# Patient Record
Sex: Male | Born: 1960 | Race: White | Hispanic: No | Marital: Single | Smoking: Former smoker
Health system: Southern US, Community
[De-identification: ages and names within clinical notes are randomized; demographics above are authoritative.]

## PROBLEM LIST (undated history)

## (undated) DIAGNOSIS — K219 Gastro-esophageal reflux disease without esophagitis: Secondary | ICD-10-CM

## (undated) DIAGNOSIS — Z8669 Personal history of other diseases of the nervous system and sense organs: Secondary | ICD-10-CM

## (undated) DIAGNOSIS — J449 Chronic obstructive pulmonary disease, unspecified: Secondary | ICD-10-CM

## (undated) DIAGNOSIS — I495 Sick sinus syndrome: Secondary | ICD-10-CM

## (undated) DIAGNOSIS — E785 Hyperlipidemia, unspecified: Secondary | ICD-10-CM

## (undated) DIAGNOSIS — Z950 Presence of cardiac pacemaker: Secondary | ICD-10-CM

## (undated) DIAGNOSIS — R413 Other amnesia: Secondary | ICD-10-CM

## (undated) DIAGNOSIS — G4733 Obstructive sleep apnea (adult) (pediatric): Secondary | ICD-10-CM

## (undated) DIAGNOSIS — E7800 Pure hypercholesterolemia, unspecified: Secondary | ICD-10-CM

## (undated) DIAGNOSIS — F4310 Post-traumatic stress disorder, unspecified: Secondary | ICD-10-CM

## (undated) DIAGNOSIS — J45909 Unspecified asthma, uncomplicated: Secondary | ICD-10-CM

## (undated) DIAGNOSIS — I10 Essential (primary) hypertension: Secondary | ICD-10-CM

## (undated) DIAGNOSIS — C801 Malignant (primary) neoplasm, unspecified: Secondary | ICD-10-CM

## (undated) HISTORY — PX: CARDIAC SURGERY: SHX584

## (undated) HISTORY — PX: PACEMAKER INSERTION: SHX728

---

## 2017-03-13 ENCOUNTER — Encounter (HOSPITAL_COMMUNITY): Payer: Self-pay | Admitting: Emergency Medicine

## 2017-03-13 ENCOUNTER — Emergency Department (HOSPITAL_COMMUNITY)
Admission: EM | Admit: 2017-03-13 | Discharge: 2017-03-13 | Disposition: A | Discharge status: Home / Self Care | Payer: Medicaid Other | Attending: Emergency Medicine | Admitting: Emergency Medicine

## 2017-03-13 ENCOUNTER — Other Ambulatory Visit: Payer: Self-pay

## 2017-03-13 DIAGNOSIS — I10 Essential (primary) hypertension: Secondary | ICD-10-CM | POA: Diagnosis not present

## 2017-03-13 DIAGNOSIS — F17290 Nicotine dependence, other tobacco product, uncomplicated: Secondary | ICD-10-CM | POA: Insufficient documentation

## 2017-03-13 DIAGNOSIS — R002 Palpitations: Secondary | ICD-10-CM | POA: Diagnosis not present

## 2017-03-13 DIAGNOSIS — Z950 Presence of cardiac pacemaker: Secondary | ICD-10-CM | POA: Diagnosis not present

## 2017-03-13 DIAGNOSIS — R55 Syncope and collapse: Secondary | ICD-10-CM | POA: Diagnosis present

## 2017-03-13 HISTORY — DX: Pure hypercholesterolemia, unspecified: E78.00

## 2017-03-13 HISTORY — DX: Essential (primary) hypertension: I10

## 2017-03-13 HISTORY — DX: Presence of cardiac pacemaker: Z95.0

## 2017-03-13 HISTORY — DX: Other amnesia: R41.3

## 2017-03-13 LAB — CBG MONITORING, ED: GLUCOSE-CAPILLARY: 116 mg/dL — AB (ref 65–99)

## 2017-03-13 LAB — BASIC METABOLIC PANEL
Anion gap: 11 (ref 5–15)
BUN: 14 mg/dL (ref 6–20)
CALCIUM: 9.6 mg/dL (ref 8.9–10.3)
CO2: 26 mmol/L (ref 22–32)
CREATININE: 1.24 mg/dL (ref 0.61–1.24)
Chloride: 100 mmol/L — ABNORMAL LOW (ref 101–111)
GFR calc Af Amer: >60 mL/min (ref >60)
GFR calc non Af Amer: >60 mL/min (ref >60)
GLUCOSE: 115 mg/dL — AB (ref 65–99)
Potassium: 3.2 mmol/L — ABNORMAL LOW (ref 3.5–5.1)
Sodium: 137 mmol/L (ref 135–145)

## 2017-03-13 LAB — CBC
HCT: 43.8 % (ref 39.0–52.0)
Hemoglobin: 15.1 g/dL (ref 13.0–17.0)
MCH: 29 pg (ref 26.0–34.0)
MCHC: 34.5 g/dL (ref 30.0–36.0)
MCV: 84.1 fL (ref 78.0–100.0)
PLATELETS: 165 10*3/uL (ref 150–400)
RBC: 5.21 MIL/uL (ref 4.22–5.81)
RDW: 13.5 % (ref 11.5–15.5)
WBC: 12.5 10*3/uL — ABNORMAL HIGH (ref 4.0–10.5)

## 2017-03-13 MED ORDER — POTASSIUM CHLORIDE CRYS ER 20 MEQ PO TBCR
40.0000 meq | EXTENDED_RELEASE_TABLET | Freq: Once | ORAL | Status: AC
  Administered 2017-03-13: 40 meq via ORAL
  Filled 2017-03-13: qty 2

## 2017-03-13 NOTE — ED Notes (Signed)
[employer] pacemaker interrogated at triage.

## 2017-03-13 NOTE — ED Notes (Signed)
Checked CBG 116, RN Karen informed

## 2017-03-13 NOTE — ED Provider Notes (Signed)
[facility] EMERGENCY DEPARTMENT Provider Note   CSN: [id number] Arrival date & time: 03/13/17  0305     History   Chief Complaint Chief Complaint  Patient presents with  . Near Syncope    HPI Daniel Bryant is a 56 y.o. male.  Patient presents to the ED with a chief complaint of palpitations.  He states that he felt 2 pauses in his heart tonight.  He thinks that this is associated with stress, and attributes this to being in an argument about his [employer] benefits yesterday.  He has a pacemaker, which was interrogated in triage.  He denies any chest pain, difficulty breathing.  States that he feels improved now.   The history is provided by the patient. No language interpreter was used.    Past Medical History:  Diagnosis Date  . High cholesterol   . Hypertension   . Memory impairment   . Pacemaker     There are no active problems to display for this patient.   Past Surgical History:  Procedure Laterality Date  . PACEMAKER INSERTION         Home Medications    Prior to Admission medications   Not on File    Family History No family history on file.  Social History Social History   Tobacco Use  . Smoking status: Current Every Day Smoker    Types: E-cigarettes  . Smokeless tobacco: Never Used  Substance Use Topics  . Alcohol use: No    Frequency: Never  . Drug use: No     Allergies   Patient has no allergy information on record.   Review of Systems Review of Systems  All other systems reviewed and are negative.    Physical Exam Updated Vital Signs BP (!) 145/101 (BP Location: Right Arm)   Pulse 95   Temp 98.1 F (36.7 C) (Oral)   Resp 18   SpO2 95%   Physical Exam  Constitutional: He is oriented to person, place, and time. He appears well-developed and well-nourished.  HENT:  Head: Normocephalic and atraumatic.  Eyes: Conjunctivae and EOM are normal. Pupils are equal, round, and reactive to light. Right eye  exhibits no discharge. Left eye exhibits no discharge. No scleral icterus.  Neck: Normal range of motion. Neck supple. No JVD present.  Cardiovascular: Normal rate, regular rhythm and normal heart sounds. Exam reveals no gallop and no friction rub.  No murmur heard. Pulmonary/Chest: Effort normal and breath sounds normal. No respiratory distress. He has no wheezes. He has no rales. He exhibits no tenderness.  Abdominal: Soft. He exhibits no distension and no mass. There is no tenderness. There is no rebound and no guarding.  Musculoskeletal: Normal range of motion. He exhibits no edema or tenderness.  Neurological: He is alert and oriented to person, place, and time.  Skin: Skin is warm and dry.  Psychiatric: He has a normal mood and affect. His behavior is normal. Judgment and thought content normal.  Nursing note and vitals reviewed.    ED Treatments / Results  Labs (all labs ordered are listed, but only abnormal results are displayed) Labs Reviewed  CBG MONITORING, ED - Abnormal; Notable for the following components:      Result Value   Glucose-Capillary 116 (*)    All other components within normal limits  BASIC METABOLIC PANEL  CBC  URINALYSIS, ROUTINE W REFLEX MICROSCOPIC    EKG  EKG Interpretation  Date/Time:  Monday March 13 2017 03:18:37 EST  Ventricular Rate:  99 PR Interval:  138 QRS Duration: 92 QT Interval:  354 QTC Calculation: 454 R Axis:   49 Text Interpretation:  Normal sinus rhythm T wave abnormality, consider inferior ischemia Abnormal ECG No old tracing to compare Confirmed by Jola Schmidt [phone]) on 03/13/2017 3:54:44 AM       Radiology No results found.  Procedures Procedures (including critical care time)  Medications Ordered in ED Medications - No data to display   Initial Impression / Assessment and Plan / ED Course  I have reviewed the triage vital signs and the nursing notes.  Pertinent labs & imaging results that were available  during my care of the patient were reviewed by me and considered in my medical decision making (see chart for details).     Patient with palpitations tonight.  Pacemaker showed one 6 second run for rapid ventricular rate.  This was at 3:27 after the patient had already arrived in the ED.    He is stable.  His labs are reassuring, but mildly hypokalemic.  Will supplement K.    Discussed with Dr. Venora Maples, who agrees with PCP follow-up.    Likely stress induced.  Pacer interrogation is otherwise reassuring.    VSS.   Final Clinical Impressions(s) / ED Diagnoses   Final diagnoses:  Palpitations    ED Discharge Orders    None       Montine Circle, PA-C 03/13/17 9518    Jola Schmidt, MD 03/13/17 [phone]

## 2017-03-13 NOTE — ED Triage Notes (Signed)
Pt to ED via [facility]> reports pacemaker paused tonight before he was going to bed and caused near syncope.  Reports sob.  Denies pain.

## 2018-04-05 ENCOUNTER — Emergency Department (HOSPITAL_COMMUNITY): Payer: No Typology Code available for payment source

## 2018-04-05 ENCOUNTER — Inpatient Hospital Stay (HOSPITAL_COMMUNITY)
Admission: EM | Admit: 2018-04-05 | Discharge: 2018-04-09 | DRG: 186 | Disposition: A | Discharge status: Home / Self Care | Payer: No Typology Code available for payment source | Attending: Internal Medicine | Admitting: Internal Medicine

## 2018-04-05 ENCOUNTER — Observation Stay (HOSPITAL_COMMUNITY): Payer: No Typology Code available for payment source

## 2018-04-05 ENCOUNTER — Other Ambulatory Visit: Payer: Self-pay

## 2018-04-05 ENCOUNTER — Encounter (HOSPITAL_COMMUNITY): Payer: Self-pay | Admitting: Emergency Medicine

## 2018-04-05 DIAGNOSIS — R0603 Acute respiratory distress: Secondary | ICD-10-CM

## 2018-04-05 DIAGNOSIS — J441 Chronic obstructive pulmonary disease with (acute) exacerbation: Secondary | ICD-10-CM | POA: Diagnosis present

## 2018-04-05 DIAGNOSIS — J189 Pneumonia, unspecified organism: Secondary | ICD-10-CM | POA: Diagnosis present

## 2018-04-05 DIAGNOSIS — J440 Chronic obstructive pulmonary disease with acute lower respiratory infection: Secondary | ICD-10-CM | POA: Diagnosis present

## 2018-04-05 DIAGNOSIS — J9601 Acute respiratory failure with hypoxia: Secondary | ICD-10-CM | POA: Diagnosis present

## 2018-04-05 DIAGNOSIS — R0902 Hypoxemia: Secondary | ICD-10-CM

## 2018-04-05 DIAGNOSIS — D751 Secondary polycythemia: Secondary | ICD-10-CM | POA: Diagnosis present

## 2018-04-05 DIAGNOSIS — Z79899 Other long term (current) drug therapy: Secondary | ICD-10-CM

## 2018-04-05 DIAGNOSIS — K219 Gastro-esophageal reflux disease without esophagitis: Secondary | ICD-10-CM | POA: Diagnosis present

## 2018-04-05 DIAGNOSIS — G4733 Obstructive sleep apnea (adult) (pediatric): Secondary | ICD-10-CM | POA: Diagnosis present

## 2018-04-05 DIAGNOSIS — Z720 Tobacco use: Secondary | ICD-10-CM

## 2018-04-05 DIAGNOSIS — J90 Pleural effusion, not elsewhere classified: Principal | ICD-10-CM | POA: Diagnosis present

## 2018-04-05 DIAGNOSIS — E7800 Pure hypercholesterolemia, unspecified: Secondary | ICD-10-CM | POA: Diagnosis present

## 2018-04-05 DIAGNOSIS — E785 Hyperlipidemia, unspecified: Secondary | ICD-10-CM | POA: Diagnosis present

## 2018-04-05 DIAGNOSIS — I10 Essential (primary) hypertension: Secondary | ICD-10-CM | POA: Diagnosis present

## 2018-04-05 DIAGNOSIS — E876 Hypokalemia: Secondary | ICD-10-CM | POA: Diagnosis present

## 2018-04-05 DIAGNOSIS — J181 Lobar pneumonia, unspecified organism: Secondary | ICD-10-CM

## 2018-04-05 DIAGNOSIS — R413 Other amnesia: Secondary | ICD-10-CM | POA: Diagnosis present

## 2018-04-05 DIAGNOSIS — Z9889 Other specified postprocedural states: Secondary | ICD-10-CM

## 2018-04-05 DIAGNOSIS — G40A09 Absence epileptic syndrome, not intractable, without status epilepticus: Secondary | ICD-10-CM | POA: Diagnosis present

## 2018-04-05 DIAGNOSIS — F4310 Post-traumatic stress disorder, unspecified: Secondary | ICD-10-CM | POA: Diagnosis present

## 2018-04-05 DIAGNOSIS — Z7982 Long term (current) use of aspirin: Secondary | ICD-10-CM

## 2018-04-05 DIAGNOSIS — Z9119 Patient's noncompliance with other medical treatment and regimen: Secondary | ICD-10-CM

## 2018-04-05 HISTORY — DX: Post-traumatic stress disorder, unspecified: F43.10

## 2018-04-05 HISTORY — DX: Unspecified asthma, uncomplicated: J45.909

## 2018-04-05 HISTORY — DX: Obstructive sleep apnea (adult) (pediatric): G47.33

## 2018-04-05 HISTORY — DX: Hyperlipidemia, unspecified: E78.5

## 2018-04-05 HISTORY — DX: Personal history of other diseases of the nervous system and sense organs: Z86.69

## 2018-04-05 HISTORY — DX: Gastro-esophageal reflux disease without esophagitis: K21.9

## 2018-04-05 HISTORY — DX: Chronic obstructive pulmonary disease, unspecified: J44.9

## 2018-04-05 HISTORY — DX: Sick sinus syndrome: I49.5

## 2018-04-05 LAB — CBC WITH DIFFERENTIAL/PLATELET
Abs Immature Granulocytes: 0.02 10*3/uL (ref 0.00–0.07)
Basophils Absolute: 0.1 10*3/uL (ref 0.0–0.1)
Basophils Relative: 1 %
Eosinophils Absolute: 0.5 10*3/uL (ref 0.0–0.5)
Eosinophils Relative: 5 %
HCT: 54.9 % — ABNORMAL HIGH (ref 39.0–52.0)
Hemoglobin: 17.6 g/dL — ABNORMAL HIGH (ref 13.0–17.0)
Immature Granulocytes: 0 %
Lymphocytes Relative: 24 %
Lymphs Abs: 2.3 10*3/uL (ref 0.7–4.0)
MCH: 28.5 pg (ref 26.0–34.0)
MCHC: 32.1 g/dL (ref 30.0–36.0)
MCV: 88.8 fL (ref 80.0–100.0)
Monocytes Absolute: 0.6 10*3/uL (ref 0.1–1.0)
Monocytes Relative: 6 %
Neutro Abs: 6.4 10*3/uL (ref 1.7–7.7)
Neutrophils Relative %: 64 %
Platelets: 188 10*3/uL (ref 150–400)
RBC: 6.18 MIL/uL — ABNORMAL HIGH (ref 4.22–5.81)
RDW: 14.5 % (ref 11.5–15.5)
WBC: 9.8 10*3/uL (ref 4.0–10.5)
nRBC: 0 % (ref 0.0–0.2)

## 2018-04-05 LAB — BASIC METABOLIC PANEL
Anion gap: 11 (ref 5–15)
BUN: 7 mg/dL (ref 6–20)
CO2: 25 mmol/L (ref 22–32)
Calcium: 8.8 mg/dL — ABNORMAL LOW (ref 8.9–10.3)
Chloride: 101 mmol/L (ref 98–111)
Creatinine, Ser: 0.97 mg/dL (ref 0.61–1.24)
GFR calc Af Amer: >60 mL/min (ref >60)
GFR calc non Af Amer: >60 mL/min (ref >60)
Glucose, Bld: 140 mg/dL — ABNORMAL HIGH (ref 70–99)
Potassium: 3 mmol/L — ABNORMAL LOW (ref 3.5–5.1)
Sodium: 137 mmol/L (ref 135–145)

## 2018-04-05 LAB — BODY FLUID CELL COUNT WITH DIFFERENTIAL
Eos, Fluid: 23 %
Lymphs, Fluid: 55 %
Monocyte-Macrophage-Serous Fluid: 15 % — ABNORMAL LOW (ref 50–90)
Neutrophil Count, Fluid: 7 % (ref 0–25)
Total Nucleated Cell Count, Fluid: 848 uL (ref 0–1000)

## 2018-04-05 LAB — LACTATE DEHYDROGENASE, PLEURAL OR PERITONEAL FLUID: LD, Fluid: 167 U/L — ABNORMAL HIGH (ref 3–23)

## 2018-04-05 LAB — PHOSPHORUS: Phosphorus: 2.7 mg/dL (ref 2.5–4.6)

## 2018-04-05 LAB — PROTEIN, PLEURAL OR PERITONEAL FLUID: Total protein, fluid: 4.6 g/dL

## 2018-04-05 LAB — GLUCOSE, PLEURAL OR PERITONEAL FLUID: Glucose, Fluid: 119 mg/dL

## 2018-04-05 LAB — MAGNESIUM: Magnesium: 2 mg/dL (ref 1.7–2.4)

## 2018-04-05 MED ORDER — CEFTRIAXONE SODIUM 1 G IJ SOLR
1.0000 g | Freq: Once | INTRAMUSCULAR | Status: AC
  Administered 2018-04-05: 1 g via INTRAVENOUS
  Filled 2018-04-05: qty 10

## 2018-04-05 MED ORDER — HEPARIN SODIUM (PORCINE) 5000 UNIT/ML IJ SOLN: 5000.0000 [IU] | Freq: Three times a day (TID) | INTRAMUSCULAR | Status: DC

## 2018-04-05 MED ORDER — ALBUTEROL (5 MG/ML) CONTINUOUS INHALATION SOLN
15.0000 mg/h | INHALATION_SOLUTION | RESPIRATORY_TRACT | Status: DC
  Administered 2018-04-05: 15 mg/h via RESPIRATORY_TRACT
  Filled 2018-04-05: qty 20

## 2018-04-05 MED ORDER — IPRATROPIUM-ALBUTEROL 0.5-2.5 (3) MG/3ML IN SOLN
3.0000 mL | Freq: Four times a day (QID) | RESPIRATORY_TRACT | Status: DC
  Administered 2018-04-05 (×2): 3 mL via RESPIRATORY_TRACT
  Filled 2018-04-05 (×2): qty 3

## 2018-04-05 MED ORDER — ALBUTEROL SULFATE (2.5 MG/3ML) 0.083% IN NEBU: 2.5000 mg | INHALATION_SOLUTION | Freq: Four times a day (QID) | RESPIRATORY_TRACT | Status: DC

## 2018-04-05 MED ORDER — SODIUM CHLORIDE 0.9 % IV SOLN
500.0000 mg | INTRAVENOUS | Status: DC
  Filled 2018-04-05: qty 500

## 2018-04-05 MED ORDER — KETOROLAC TROMETHAMINE 30 MG/ML IJ SOLN: 30.0000 mg | Freq: Four times a day (QID) | INTRAMUSCULAR | Status: AC | PRN

## 2018-04-05 MED ORDER — ASPIRIN EC 81 MG PO TBEC
81.0000 mg | DELAYED_RELEASE_TABLET | Freq: Every day | ORAL | Status: DC
  Administered 2018-04-06 – 2018-04-09 (×4): 81 mg via ORAL
  Filled 2018-04-05 (×4): qty 1

## 2018-04-05 MED ORDER — POTASSIUM CHLORIDE CRYS ER 20 MEQ PO TBCR
60.0000 meq | EXTENDED_RELEASE_TABLET | Freq: Once | ORAL | Status: AC
  Administered 2018-04-05: 60 meq via ORAL
  Filled 2018-04-05: qty 3

## 2018-04-05 MED ORDER — HEPARIN SODIUM (PORCINE) 5000 UNIT/ML IJ SOLN
5000.0000 [IU] | Freq: Three times a day (TID) | INTRAMUSCULAR | Status: DC
  Administered 2018-04-05 – 2018-04-09 (×11): 5000 [IU] via SUBCUTANEOUS
  Filled 2018-04-05 (×11): qty 1

## 2018-04-05 MED ORDER — ACETAMINOPHEN 650 MG RE SUPP: 650.0000 mg | Freq: Four times a day (QID) | RECTAL | Status: DC | PRN

## 2018-04-05 MED ORDER — SODIUM CHLORIDE 0.9 % IV SOLN
1.0000 g | INTRAVENOUS | Status: DC
  Administered 2018-04-06 – 2018-04-09 (×4): 1 g via INTRAVENOUS
  Filled 2018-04-05 (×4): qty 1

## 2018-04-05 MED ORDER — METHYLPREDNISOLONE SODIUM SUCC 40 MG IJ SOLR
40.0000 mg | Freq: Four times a day (QID) | INTRAMUSCULAR | Status: AC
  Administered 2018-04-05 – 2018-04-06 (×3): 40 mg via INTRAVENOUS
  Filled 2018-04-05 (×3): qty 1

## 2018-04-05 MED ORDER — LORAZEPAM 2 MG/ML IJ SOLN
1.0000 mg | Freq: Once | INTRAMUSCULAR | Status: AC
  Administered 2018-04-05: 1 mg via INTRAVENOUS
  Filled 2018-04-05: qty 1

## 2018-04-05 MED ORDER — ALBUTEROL SULFATE (2.5 MG/3ML) 0.083% IN NEBU: 2.5000 mg | INHALATION_SOLUTION | RESPIRATORY_TRACT | Status: DC | PRN

## 2018-04-05 MED ORDER — SODIUM CHLORIDE 0.9 % IV SOLN
500.0000 mg | Freq: Once | INTRAVENOUS | Status: AC
  Administered 2018-04-05: 500 mg via INTRAVENOUS
  Filled 2018-04-05: qty 500

## 2018-04-05 MED ORDER — IPRATROPIUM BROMIDE 0.02 % IN SOLN: 0.5000 mg | Freq: Four times a day (QID) | RESPIRATORY_TRACT | Status: DC

## 2018-04-05 MED ORDER — IPRATROPIUM-ALBUTEROL 0.5-2.5 (3) MG/3ML IN SOLN
3.0000 mL | Freq: Two times a day (BID) | RESPIRATORY_TRACT | Status: DC
  Administered 2018-04-06 – 2018-04-09 (×7): 3 mL via RESPIRATORY_TRACT
  Filled 2018-04-05 (×8): qty 3

## 2018-04-05 MED ORDER — ACETAMINOPHEN 325 MG PO TABS
650.0000 mg | ORAL_TABLET | Freq: Four times a day (QID) | ORAL | Status: DC | PRN
  Administered 2018-04-06 – 2018-04-09 (×2): 650 mg via ORAL
  Filled 2018-04-05 (×2): qty 2

## 2018-04-05 MED ORDER — NICOTINE 21 MG/24HR TD PT24
21.0000 mg | MEDICATED_PATCH | Freq: Once | TRANSDERMAL | Status: AC
  Administered 2018-04-05: 21 mg via TRANSDERMAL
  Filled 2018-04-05: qty 1

## 2018-04-05 MED ORDER — PREDNISONE 20 MG PO TABS
40.0000 mg | ORAL_TABLET | Freq: Every day | ORAL | Status: DC
  Administered 2018-04-07 – 2018-04-09 (×3): 40 mg via ORAL
  Filled 2018-04-05 (×4): qty 2

## 2018-04-05 MED ORDER — POTASSIUM CHLORIDE IN NACL 20-0.45 MEQ/L-% IV SOLN
INTRAVENOUS | Status: DC
  Administered 2018-04-05: 22:00:00 via INTRAVENOUS
  Filled 2018-04-05 (×3): qty 1000

## 2018-04-05 NOTE — ED Provider Notes (Signed)
[facility] DEPT Provider Note   CSN: [id number] Arrival date & time: 04/05/18  7829     History   Chief Complaint Chief Complaint  Patient presents with  . Shortness of Breath    HPI Daniel Bryant is a 58 y.o. male.  HPI   59 year old male with dyspnea.  Worsening over the past day.  History of COPD and asthma but not on home oxygen.  He says he is not had "attack" in quite some time.  He does not take his preventative medicines regularly though.  Care is been primarily been through the [place] system.  He has been coughing.  Nonproductive.  Denies any acute pain.  No fevers.  No unusual swelling.  Former smoker.  Continues to use smokeless tobacco.  Past Medical History:  Diagnosis Date  . Asthma   . COPD (chronic obstructive pulmonary disease) ([place])   . High cholesterol   . Hypertension   . Memory impairment   . Pacemaker     There are no active problems to display for this patient.   Past Surgical History:  Procedure Laterality Date  . CARDIAC SURGERY    . PACEMAKER INSERTION          Home Medications    Prior to Admission medications   Medication Sig Start Date End Date Taking? Authorizing Provider  escitalopram (LEXAPRO) 10 MG tablet Take 10 mg by mouth daily.    [provider]  lisinopril (PRINIVIL,ZESTRIL) 10 MG tablet Take 10 mg by mouth daily.    [provider]  omeprazole (PRILOSEC) 20 MG capsule Take 20 mg by mouth daily.    [provider]    Family History No family history on file.  Social History Social History   Tobacco Use  . Smoking status: Former Smoker    Types: E-cigarettes  . Smokeless tobacco: Current User    Types: Chew  Substance Use Topics  . Alcohol use: Yes    Frequency: Never  . Drug use: No     Allergies   Patient has no allergy information on record.   Review of Systems Review of Systems All systems reviewed and negative, other than as noted in  HPI.   Physical Exam Updated Vital Signs BP (!) 160/100   Pulse (!) 107   Temp (!) 97.2 F (36.2 C) (Axillary)   Resp 17   Ht 6' (1.829 m)   Wt 111.2 kg   SpO2 96%   BMI 33.24 kg/m   Physical Exam Vitals signs and nursing note reviewed.  Constitutional:      General: He is not in acute distress.    Appearance: He is well-developed.  HENT:     Head: Normocephalic and atraumatic.  Eyes:     General:        Right eye: No discharge.        Left eye: No discharge.     Conjunctiva/sclera: Conjunctivae normal.  Neck:     Musculoskeletal: Neck supple.  Cardiovascular:     Rate and Rhythm: Normal rate and regular rhythm.     Heart sounds: Normal heart sounds. No murmur. No friction rub. No gallop.   Pulmonary:     Effort: Tachypnea present. No respiratory distress.     Breath sounds: Wheezing present.     Comments: Tachypnea.  Decreased breath sounds on the right.  Wheezing noted bilaterally. Abdominal:     General: There is no distension.     Palpations: Abdomen  is soft.     Tenderness: There is no abdominal tenderness.  Musculoskeletal:        General: No tenderness.     Comments: Lower extremities symmetric as compared to each other. No calf tenderness. Negative Homan's. No palpable cords.   Skin:    General: Skin is warm and dry.  Neurological:     Mental Status: He is alert.  Psychiatric:        Behavior: Behavior normal.        Thought Content: Thought content normal.      ED Treatments / Results  Labs (all labs ordered are listed, but only abnormal results are displayed) Labs Reviewed  CBC WITH DIFFERENTIAL/PLATELET - Abnormal; Notable for the following components:      Result Value   RBC 6.18 (*)    Hemoglobin 17.6 (*)    HCT 54.9 (*)    All other components within normal limits  BASIC METABOLIC PANEL - Abnormal; Notable for the following components:   Potassium 3.0 (*)    Glucose, Bld 140 (*)    Calcium 8.8 (*)    All other components within  normal limits  LACTATE DEHYDROGENASE, PLEURAL OR PERITONEAL FLUID - Abnormal; Notable for the following components:   LD, Fluid 167 (*)    All other components within normal limits  BODY FLUID CELL COUNT WITH DIFFERENTIAL - Abnormal; Notable for the following components:   Appearance, Fluid CLEAR (*)    Monocyte-Macrophage-Serous Fluid 15 (*)    All other components within normal limits  CBC WITH DIFFERENTIAL/PLATELET - Abnormal; Notable for the following components:   WBC 20.5 (*)    Neutro Abs 18.7 (*)    Abs Immature Granulocytes 0.21 (*)    All other components within normal limits  BASIC METABOLIC PANEL - Abnormal; Notable for the following components:   Glucose, Bld 138 (*)    All other components within normal limits  CBC - Abnormal; Notable for the following components:   WBC 20.5 (*)    All other components within normal limits  BASIC METABOLIC PANEL - Abnormal; Notable for the following components:   Glucose, Bld 109 (*)    All other components within normal limits  CBC - Abnormal; Notable for the following components:   WBC 12.5 (*)    HCT 52.3 (*)    Platelets 142 (*)    All other components within normal limits  BASIC METABOLIC PANEL - Abnormal; Notable for the following components:   Calcium 8.5 (*)    All other components within normal limits  GLUCOSE, CAPILLARY - Abnormal; Notable for the following components:   Glucose-Capillary 135 (*)    All other components within normal limits  CBC - Abnormal; Notable for the following components:   WBC 11.8 (*)    HCT 52.7 (*)    Platelets 148 (*)    All other components within normal limits  BASIC METABOLIC PANEL - Abnormal; Notable for the following components:   Potassium 3.3 (*)    Calcium 8.4 (*)    All other components within normal limits  BODY FLUID CULTURE  EXPECTORATED SPUTUM ASSESSMENT W REFEX TO RESP CULTURE  GRAM STAIN  PH, BODY FLUID  CHOLESTEROL, BODY FLUID  TRIGLYCERIDES, BODY FLUIDS  PROTEIN,  PLEURAL OR PERITONEAL FLUID  GLUCOSE, PLEURAL OR PERITONEAL FLUID  MAGNESIUM  PHOSPHORUS  HIV ANTIBODY (ROUTINE TESTING W REFLEX)  BRAIN NATRIURETIC PEPTIDE  PROCALCITONIN  GLUCOSE, CAPILLARY  STREP PNEUMONIAE URINARY ANTIGEN  CYTOLOGY - NON PAP  CYTOLOGY - NON PAP    EKG EKG Interpretation  Date/Time:  Thursday April 05 2018 10:10:10 EST Ventricular Rate:  101 PR Interval:    QRS Duration: 96 QT Interval:  357 QTC Calculation: 463 R Axis:   51 Text Interpretation:  Sinus tachycardia Confirmed by Virgel Manifold [phone]) on 04/05/2018 12:51:52 PM   Radiology Dg Chest Portable 1 View  Result Date: 04/05/2018 CLINICAL DATA:  Shortness of breath for the past 24 hours. Missed asthma medications yesterday. EXAM: PORTABLE CHEST 1 VIEW COMPARISON:  None. FINDINGS: Large right pleural effusion mild adjacent right lung atelectasis. Clear left lung. Minimal diffuse peribronchial thickening and accentuation of the interstitial markings. The right heart borders are obscured by the pleural fluid with no gross cardiac enlargement. Right subclavian pacemaker leads in satisfactory position. Unremarkable bones. IMPRESSION: 1. Large right pleural effusion with mild adjacent right lung atelectasis. 2. Minimal chronic bronchitic changes. Electronically Signed   By: Claudie Revering M.D.   On: 04/05/2018 10:35    Procedures Procedures (including critical care time)  CRITICAL CARE Performed by: Virgel Manifold Total critical care time: 35 minutes Critical care time was exclusive of separately billable procedures and treating other patients. Critical care was necessary to treat or prevent imminent or life-threatening deterioration. Critical care was time spent personally by me on the following activities: development of treatment plan with patient and/or surrogate as well as nursing, discussions with consultants, evaluation of patient's response to treatment, examination of patient, obtaining history from  patient or surrogate, ordering and performing treatments and interventions, ordering and review of laboratory studies, ordering and review of radiographic studies, pulse oximetry and re-evaluation of patient's condition.   Medications Ordered in ED Medications  albuterol (PROVENTIL,VENTOLIN) solution continuous neb (15 mg/hr Nebulization New Bag/Given 04/05/18 1051)  cefTRIAXone (ROCEPHIN) 1 g in sodium chloride 0.9 % 100 mL IVPB (has no administration in time range)  azithromycin (ZITHROMAX) 500 mg in sodium chloride 0.9 % 250 mL IVPB (has no administration in time range)  nicotine (NICODERM CQ - dosed in mg/24 hours) patch 21 mg (has no administration in time range)  LORazepam (ATIVAN) injection 1 mg (has no administration in time range)  potassium chloride SA (K-DUR,KLOR-CON) CR tablet 60 mEq (has no administration in time range)     Initial Impression / Assessment and Plan / ED Course  I have reviewed the triage vital signs and the nursing notes.  Pertinent labs & imaging results that were available during my care of the patient were reviewed by me and considered in my medical decision making (see chart for details).     58 year old male with dyspnea.  Wheezing on exam.  Given nebs and steroids prehospital.  Imaging with a large right-sided pleural effusion.  No known history of malignancy.  Is a former smoker.  IR for US guided thoracentesis. Will cover for abx for possible infection for the time being.  Will need admission for ongoing treatment/evaluation.  Final Clinical Impressions(s) / ED Diagnoses   Final diagnoses:  Pleural effusion  Respiratory distress    ED Discharge Orders    None       Virgel Manifold, MD 04/09/18 [phone]

## 2018-04-05 NOTE — ED Notes (Signed)
Called Respiratory and made aware of continuous neb treatment ordered.

## 2018-04-05 NOTE — ED Notes (Signed)
Pt is alert and oriented x 4 and is verbally responsive. Pt is fnd with cont. Neb txmt in place. Pt is breathing unlabored and is not in any distress at this time.

## 2018-04-05 NOTE — Procedures (Signed)
PROCEDURE SUMMARY:  Successful image-guided right thoracentesis. Yielded 2 liters of clear gold fluid. Patient tolerated procedure well. No immediate complications.  Specimen was sent for labs. CXR ordered.  Claris Pong Louk PA-C 04/05/2018 1:17 PM

## 2018-04-05 NOTE — ED Notes (Signed)
ED TO INPATIENT HANDOFF REPORT  Name/Age/Gender Daniel Bryant 58 y.o. male  Code Status    Code Status Orders  (From admission, onward)         Start     Ordered   04/05/18 1235  Full code  Continuous     04/05/18 1239        Code Status History    This patient has a current code status but no historical code status.      Home/SNF/Other Home  Chief Complaint resp distress  Level of Care/Admitting Diagnosis ED Disposition    ED Disposition Condition Comment   Admit  Hospital Area: [facility] [[id number]]  Level of Care: Telemetry [5]  Admit to tele based on following criteria: Monitor for Ischemic changes  Diagnosis: Pleural effusion on right [408144]  Admitting Physician: Reubin Milan [[id number]]  Attending Physician: Reubin Milan [[id number]]  PT Class (Do Not Modify): Observation [104]  PT Acc Code (Do Not Modify): Observation [10022]       Medical History Past Medical History:  Diagnosis Date  . Asthma   . COPD (chronic obstructive pulmonary disease) ([facility])   . GERD (gastroesophageal reflux disease)   . High cholesterol   . History of petit-mal seizures   . Hyperlipidemia   . Hypertension   . Memory impairment   . OSA (obstructive sleep apnea)    Does not tolerate CPAP  . Pacemaker   . PTSD (post-traumatic stress disorder)   . Sick sinus syndrome ([facility])     Allergies Not on File  IV Location/Drains/Wounds Patient Lines/Drains/Airways Status   Active Line/Drains/Airways    Name:   Placement date:   Placement time:   Site:   Days:   Peripheral IV (Ped) 04/05/18   04/05/18    1130     less than 1          Labs/Imaging Results for orders placed or performed during the hospital encounter of 04/05/18 (from the past 48 hour(s))  CBC with Differential     Status: Abnormal   Collection Time: 04/05/18 10:34 AM  Result Value Ref Range   WBC 9.8 4.0 - 10.5 K/uL   RBC 6.18 (H) 4.22 - 5.81 MIL/uL   Hemoglobin 17.6  (H) 13.0 - 17.0 g/dL   HCT 54.9 (H) 39.0 - 52.0 %   MCV 88.8 80.0 - 100.0 fL   MCH 28.5 26.0 - 34.0 pg   MCHC 32.1 30.0 - 36.0 g/dL   RDW 14.5 11.5 - 15.5 %   Platelets 188 150 - 400 K/uL   nRBC 0.0 0.0 - 0.2 %   Neutrophils Relative % 64 %   Neutro Abs 6.4 1.7 - 7.7 K/uL   Lymphocytes Relative 24 %   Lymphs Abs 2.3 0.7 - 4.0 K/uL   Monocytes Relative 6 %   Monocytes Absolute 0.6 0.1 - 1.0 K/uL   Eosinophils Relative 5 %   Eosinophils Absolute 0.5 0.0 - 0.5 K/uL   Basophils Relative 1 %   Basophils Absolute 0.1 0.0 - 0.1 K/uL   Immature Granulocytes 0 %   Abs Immature Granulocytes 0.02 0.00 - 0.07 K/uL    Comment: Performed at [facility], [place] [address] [place], [place] [address]  Basic metabolic panel     Status: Abnormal   Collection Time: 04/05/18 10:34 AM  Result Value Ref Range   Sodium 137 135 - 145 mmol/L   Potassium 3.0 (L) 3.5 - 5.1 mmol/L  Chloride 101 98 - 111 mmol/L   CO2 25 22 - 32 mmol/L   Glucose, Bld 140 (H) 70 - 99 mg/dL   BUN 7 6 - 20 mg/dL   Creatinine, Ser 0.97 0.61 - 1.24 mg/dL   Calcium 8.8 (L) 8.9 - 10.3 mg/dL   GFR calc non Af Amer >60 >60 mL/min   GFR calc Af Amer >60 >60 mL/min   Anion gap 11 5 - 15    Comment: Performed at [facility], [place] [address], [place], [place] [address]   Dg Chest Portable 1 View  Result Date: 04/05/2018 CLINICAL DATA:  Shortness of breath for the past 24 hours. Missed asthma medications yesterday. EXAM: PORTABLE CHEST 1 VIEW COMPARISON:  None. FINDINGS: Large right pleural effusion mild adjacent right lung atelectasis. Clear left lung. Minimal diffuse peribronchial thickening and accentuation of the interstitial markings. The right heart borders are obscured by the pleural fluid with no gross cardiac enlargement. Right subclavian pacemaker leads in satisfactory position. Unremarkable bones. IMPRESSION: 1. Large right pleural effusion with mild adjacent right lung atelectasis. 2.  Minimal chronic bronchitic changes. Electronically Signed   By: Claudie Revering M.D.   On: 04/05/2018 10:35    Pending Labs Unresulted Labs (From admission, onward)    Start     Ordered   04/06/18 0500  HIV antibody (Routine Testing)  Tomorrow morning,   R     04/05/18 1239   04/06/18 0500  CBC WITH DIFFERENTIAL  Tomorrow morning,   R     04/05/18 1239   04/06/18 6045  Basic metabolic panel  Tomorrow morning,   R     04/05/18 1239   04/05/18 1240  Magnesium  Add-on,   R     04/05/18 1239   04/05/18 1240  Phosphorus  Add-on,   R     04/05/18 1239   04/05/18 1235  Culture, sputum-assessment  Once,   R     04/05/18 1239   04/05/18 1235  Gram stain  Once,   R     04/05/18 1239   04/05/18 1235  Strep pneumoniae urinary antigen  Once,   R     04/05/18 1239   04/05/18 1114  Protein, pleural or peritoneal fluid  Once,   R     04/05/18 1114   04/05/18 1114  Glucose, pleural or peritoneal fluid  Once,   R     04/05/18 1114   04/05/18 1114  Body fluid culture (includes gram stain)  Once,   R    Question:  Are there also cytology or pathology orders on this specimen?  Answer:  Yes   04/05/18 1114   04/05/18 1114  Body fluid cell count with differential  Once,   R    Question:  Are there also cytology or pathology orders on this specimen?  Answer:  Yes   04/05/18 1114   04/05/18 1113  PH, Body Fluid  Once,   R    Comments:  Send specimen on ice.    04/05/18 1114   04/05/18 1113  Cholesterol, body fluid  Once,   R     04/05/18 1114   04/05/18 1113  Triglycerides, Body Fluid  Once,   R     04/05/18 1114   04/05/18 1113  Lactate dehydrogenase (pleural or peritoneal fluid)  Once,   R     04/05/18 1114          Vitals/Pain Today's Vitals   04/05/18 1010  04/05/18 1052 04/05/18 1100 04/05/18 1115  BP: (!) 188/111  (!) 160/100   Pulse: 100  97 (!) 107  Resp: 20  20 17   Temp: (!) 97.2 F (36.2 C)     TempSrc: Axillary     SpO2: 98% (!) 89% 96% 96%  Weight:      Height:      PainSc:         Isolation Precautions No active isolations  Medications Medications  albuterol (PROVENTIL,VENTOLIN) solution continuous neb (15 mg/hr Nebulization New Bag/Given 04/05/18 1051)  nicotine (NICODERM CQ - dosed in mg/24 hours) patch 21 mg (21 mg Transdermal Patch Applied 04/05/18 1137)  heparin injection 5,000 Units (has no administration in time range)  acetaminophen (TYLENOL) tablet 650 mg (has no administration in time range)    Or  acetaminophen (TYLENOL) suppository 650 mg (has no administration in time range)  cefTRIAXone (ROCEPHIN) 1 g in sodium chloride 0.9 % 100 mL IVPB (has no administration in time range)  azithromycin (ZITHROMAX) 500 mg in sodium chloride 0.9 % 250 mL IVPB (has no administration in time range)  albuterol (PROVENTIL) (2.5 MG/3ML) 0.083% nebulizer solution 2.5 mg (has no administration in time range)  ipratropium (ATROVENT) nebulizer solution 0.5 mg (has no administration in time range)  albuterol (PROVENTIL) (2.5 MG/3ML) 0.083% nebulizer solution 2.5 mg (has no administration in time range)  cefTRIAXone (ROCEPHIN) 1 g in sodium chloride 0.9 % 100 mL IVPB (1 g Intravenous New Bag/Given 04/05/18 1137)  azithromycin (ZITHROMAX) 500 mg in sodium chloride 0.9 % 250 mL IVPB (500 mg Intravenous New Bag/Given 04/05/18 1140)  LORazepam (ATIVAN) injection 1 mg (1 mg Intravenous Given 04/05/18 1137)  potassium chloride SA (K-DUR,KLOR-CON) CR tablet 60 mEq (60 mEq Oral Given 04/05/18 1136)    Mobility walks

## 2018-04-05 NOTE — H&P (Signed)
History and Physical    [name] ZOX:[id number] DOB: 11/03/1960 DOA: 04/05/2018  PCP: Patient, No Pcp Per  Patient coming from: Home.  I have personally briefly reviewed patient's old medical records in [place]  Chief Complaint: Shortness of breath.  HPI: Daniel Bryant is a 58 y.o. male with medical history significant of asthma/COPD, GERD, hyperlipidemia, history of petit mal seizures in remission, hyperlipidemia, hypertension, memory impairment, OSA not on CPAP, history of pacemaker placement due to sick sinus syndrome, PTSD who is coming to the emergency department due to progressively worsening dyspnea associated with productive cough for yellowish sputum, wheezing, fatigue and malaise for the past 24 hours.  He was given albuterol 5 mg neb x2, Atrovent 0.5 mg x 1, Solu-Medrol 125 mg IVP by the EMS crew.  He is unable to provide further history after he was sedated with lorazepam 1 mg IVP prior to undergoing thoracentesis.  ED Course: Initial vital signs temperature 97.2 F, pulse 100, respirations 20, blood pressure 188/111 mmHg and O2 sat 98% on room air.  The patient was given supplemental oxygen, 60 mEq of KCl p.o., ceftriaxone 1 g and azithromycin 500 mg IVPB.  He was referred to interventional radiology for thoracentesis.  His white count was 9.8, hemoglobin 17.6 g/dL and platelets 188.  Differential shows 64% neutrophils, 24% lymphocytes and 6% monocytes.  BMP shows potassium of 3.0 mmol/L, glucose of 140 and calcium of 8.8 mg/dL.  Other values are within normal limits.  Magnesium and phosphorus were normal.  Pleural fluid cytology shows clear liquid with 848 WBC with 55% neutrophils, 23% eosinophils, 7% neutrophils and 15% monocytes.  Review of Systems: Unable to obtain due to lorazepam sedation..   Past Medical History:  Diagnosis Date  . Asthma   . COPD (chronic obstructive pulmonary disease) ([place])   . GERD (gastroesophageal reflux disease)   . High  cholesterol   . History of petit-mal seizures   . Hyperlipidemia   . Hypertension   . Memory impairment   . OSA (obstructive sleep apnea)    Does not tolerate CPAP  . Pacemaker   . PTSD (post-traumatic stress disorder)   . Sick sinus syndrome [facility])     Past Surgical History:  Procedure Laterality Date  . CARDIAC SURGERY    . PACEMAKER INSERTION       reports that he has quit smoking. His smoking use included e-cigarettes. His smokeless tobacco use includes chew. He reports current alcohol use. He reports that he does not use drugs.  Not on File  Family History  Problem Relation Age of Onset  . Alzheimer's disease Mother   . Cancer Mother   . Memory loss Father    Prior to Admission medications   Medication Sig Start Date End Date Taking? Authorizing Provider  aspirin EC 81 MG tablet Take 81 mg by mouth daily.   Yes [provider]  atorvastatin (LIPITOR) 40 MG tablet Take 40 mg by mouth at bedtime.   Yes [provider]  busPIRone (BUSPAR) 10 MG tablet Take 5 mg by mouth 2 (two) times daily.   Yes [provider]  donepezil (ARICEPT) 5 MG tablet Take 5 mg by mouth at bedtime.   Yes [provider]  pantoprazole (PROTONIX) 40 MG tablet Take 40 mg by mouth daily. Take 40mg  by mouth before breakfast as needed, take on empty stomach 30 minutes prior to a meal   Yes [provider]  vitamin B-12 (CYANOCOBALAMIN) 500  MCG tablet Take 500 mcg by mouth daily.   Yes [provider]  Cholecalciferol (VITAMIN D3) 25 MCG (1000 UT) CAPS Take 1,000 Units by mouth daily.    [provider]  escitalopram (LEXAPRO) 20 MG tablet Take 20 mg by mouth daily.     [provider]  lisinopril (PRINIVIL,ZESTRIL) 5 MG tablet Take 5 mg by mouth daily.     [provider]    Physical Exam: Vitals:   04/05/18 1340 04/05/18 1356 04/05/18 1400 04/05/18 1539  BP: 102/69  103/66 125/77  Pulse: (!) 107  100 (!) 109  Resp: 19   20 18   Temp:    (!) 97.3 F (36.3 C)  TempSrc:    Oral  SpO2: 91% 90% 92% 97%  Weight:      Height:        Constitutional: Somnolent, but in NAD, calm, comfortable Eyes: PERRL, lids and conjunctivae normal ENMT: Mucous membranes are dry. Posterior pharynx clear of any exudate or lesions. Neck: normal, supple, no masses, no thyromegaly Respiratory: Decreased breath sounds with mild rhonchi and bilateral wheezing, no crackles. Normal respiratory effort. No accessory muscle use.  Cardiovascular: Tachycardic at 103 bpm, no murmurs / rubs / gallops. No extremity edema. 2+ pedal pulses. No carotid bruits.  Abdomen: Obese, soft, no tenderness, no masses palpated. No hepatosplenomegaly. Bowel sounds positive.  Musculoskeletal: no clubbing / cyanosis.  Good ROM, no contractures.  Mildly relaxed muscle tone.  Skin: no rashes, lesions, ulcers. No induration Neurologic: CN 2-12 grossly intact. Sensation intact, DTR normal. Strength 5/5 in all 4.  Unable to fully perform due to sedation. Psychiatric: Somnolent from earlier lorazepam administration.  Wakes up briefly and is oriented to name, place, situation and partially to time.  Answers simple questions.  Then quickly goes back to sleep.  Labs on Admission: I have personally reviewed following labs and imaging studies  CBC: Recent Labs  Lab 04/05/18 1034  WBC 9.8  NEUTROABS 6.4  HGB 17.6*  HCT 54.9*  MCV 88.8  PLT 263   Basic Metabolic Panel: Recent Labs  Lab 04/05/18 1034  NA 137  K 3.0*  CL 101  CO2 25  GLUCOSE 140*  BUN 7  CREATININE 0.97  CALCIUM 8.8*  MG 2.0  PHOS 2.7   GFR: Estimated Creatinine Clearance: 104.2 mL/min (by C-G formula based on SCr of 0.97 mg/dL). Liver Function Tests: No results for input(s): AST, ALT, ALKPHOS, BILITOT, PROT, ALBUMIN in the last 168 hours. No results for input(s): LIPASE, AMYLASE in the last 168 hours. No results for input(s): AMMONIA in the last 168 hours. Coagulation Profile: No  results for input(s): INR, PROTIME in the last 168 hours. Cardiac Enzymes: No results for input(s): CKTOTAL, CKMB, CKMBINDEX, TROPONINI in the last 168 hours. BNP (last 3 results) No results for input(s): PROBNP in the last 8760 hours. HbA1C: No results for input(s): HGBA1C in the last 72 hours. CBG: No results for input(s): GLUCAP in the last 168 hours. Lipid Profile: No results for input(s): CHOL, HDL, LDLCALC, TRIG, CHOLHDL, LDLDIRECT in the last 72 hours. Thyroid Function Tests: No results for input(s): TSH, T4TOTAL, FREET4, T3FREE, THYROIDAB in the last 72 hours. Anemia Panel: No results for input(s): VITAMINB12, FOLATE, FERRITIN, TIBC, IRON, RETICCTPCT in the last 72 hours. Urine analysis: No results found for: COLORURINE, APPEARANCEUR, LABSPEC, PHURINE, GLUCOSEU, HGBUR, BILIRUBINUR, KETONESUR, PROTEINUR, UROBILINOGEN, NITRITE, LEUKOCYTESUR  Radiological Exams on Admission: Dg Chest 1 View  Result Date: 04/05/2018 CLINICAL DATA:  Post thoracentesis,  history of COPD EXAM: CHEST  1 VIEW COMPARISON:  None. FINDINGS: RIGHT-sided pacemaker overlies stable enlarged cardiac silhouette. Large RIGHT effusion is decreased slightly in volume compared to prior. No pneumothorax. IMPRESSION: Decrease in RIGHT effusion.  No pneumothorax. Persistent large RIGHT effusion remains. Electronically Signed   By: Suzy Bouchard M.D.   On: 04/05/2018 13:53   Dg Chest Portable 1 View  Result Date: 04/05/2018 CLINICAL DATA:  Shortness of breath for the past 24 hours. Missed asthma medications yesterday. EXAM: PORTABLE CHEST 1 VIEW COMPARISON:  None. FINDINGS: Large right pleural effusion mild adjacent right lung atelectasis. Clear left lung. Minimal diffuse peribronchial thickening and accentuation of the interstitial markings. The right heart borders are obscured by the pleural fluid with no gross cardiac enlargement. Right subclavian pacemaker leads in satisfactory position. Unremarkable bones. IMPRESSION:  1. Large right pleural effusion with mild adjacent right lung atelectasis. 2. Minimal chronic bronchitic changes. Electronically Signed   By: Claudie Revering M.D.   On: 04/05/2018 10:35   US Thoracentesis Asp Pleural Space W/img Guide  Result Date: 04/05/2018 INDICATION: Patient with history of COPD, asthma, dyspnea, and right pleural effusion. Request is made for diagnostic and therapeutic right thoracentesis. EXAM: ULTRASOUND GUIDED DIAGNOSTIC AND THERAPEUTIC RIGHT THORACENTESIS MEDICATIONS: 10 mL 1% lidocaine COMPLICATIONS: None immediate. PROCEDURE: An ultrasound guided thoracentesis was thoroughly discussed with the patient and questions answered. The benefits, risks, alternatives and complications were also discussed. The patient understands and wishes to proceed with the procedure. Written consent was obtained. Ultrasound was performed to localize and mark an adequate pocket of fluid in the right chest. The area was then prepped and draped in the normal sterile fashion. 1% Lidocaine was used for local anesthesia. Under ultrasound guidance a 6 Fr Safe-T-Centesis catheter was introduced. Thoracentesis was performed. The catheter was removed and a dressing applied. FINDINGS: A total of approximately 2 L of clear gold fluid was removed. Samples were sent to the laboratory as requested by the clinical team. IMPRESSION: Successful ultrasound guided right thoracentesis yielding 2 L of pleural fluid. Read by: Earley Abide, PA-C Electronically Signed   By: Jerilynn Mages.  Shick M.D.   On: 04/05/2018 14:05    EKG: Independently reviewed.  Vent. rate 101 BPM PR interval * ms QRS duration 96 ms QT/QTc 357/463 ms P-R-T axes 62 51 29 Sinus tachycardia  Assessment/Plan Principal Problem:   Pleural effusion on right   CAP (community acquired pneumonia) Likely the cause of effusion. Continue ceftriaxone 1 g IVPB every 24 hours. Continue Zithromax 500 mg IVPB every 24 hours. Follow-up pleural fluid culture and  sensitivity. Check sputum Gram stain, culture and sensitivity. Check strep pneumonia urinary antigen. Continue COPD exacerbation treatment.  Active Problems:   COPD with acute exacerbation (HCC) Continue supplemental oxygen. DuoNeb every 6 hours. Albuterol 2.5 mg neb every 4 hours as needed. Solu-Medrol 40 mg IVP every 6 hours. Switch to oral prednisone after Solu-Medrol completed.    Hypokalemia Replacing. Follow-up potassium level    High cholesterol Continue atorvastatin 40 mg p.o. daily. Fasting lipid panel follow-up as an outpatient. Monitor LFTs as needed.    Hypertension Continue lisinopril 5 mg p.o. daily. Monitor BP, electrolytes and renal function.    Polycythemia Secondary to COPD and tobacco use. Monitor hematocrit and hemoglobin. Continue aspirin 81 mg p.o. daily.    DVT prophylaxis: Heparin SQ. Code Status: Full code. Family Communication:  Disposition Plan: Admit for IV antibiotic therapy for 2 to 3 days. Consults called: IR performed diagnostic thoracentesis. Admission status:  Observation/MedSurg.   Reubin Milan MD [facility]  04/05/2018, 6:23 PM   This document was prepared using Dragon voice recognition software and may contain some unintended transcription errors.

## 2018-04-05 NOTE — ED Notes (Signed)
Bed: EH21 Expected date:  Expected time:  Means of arrival:  Comments: EMS resp distress

## 2018-04-05 NOTE — ED Notes (Signed)
ED TO INPATIENT HANDOFF REPORT  Name/Age/Gender Daniel Bryant 58 y.o. male  Code Status    Code Status Orders  (From admission, onward)         Start     Ordered   04/05/18 1235  Full code  Continuous     04/05/18 1239        Code Status History    This patient has a current code status but no historical code status.      Home/SNF/Other Home  Chief Complaint resp distress  Level of Care/Admitting Diagnosis ED Disposition    ED Disposition Condition [place] [facility] Area: [place] [[id number]]  Level of Care: Med-Surg [16]  Diagnosis: Hepatocellular carcinoma [facility]) [[id number]]  Admitting Physician: Greggory Keen [phone]  Attending Physician: Greggory Keen [phone]  PT Class (Do Not Modify): Observation [104]  PT Acc Code (Do Not Modify): Observation [10022]       Medical History Past Medical History:  Diagnosis Date  . Asthma   . COPD (chronic obstructive pulmonary disease) ([place])   . GERD (gastroesophageal reflux disease)   . High cholesterol   . History of petit-mal seizures   . Hyperlipidemia   . Hypertension   . Memory impairment   . OSA (obstructive sleep apnea)    Does not tolerate CPAP  . Pacemaker   . PTSD (post-traumatic stress disorder)   . Sick sinus syndrome (HCC)     Allergies Not on File  IV Location/Drains/Wounds Patient Lines/Drains/Airways Status   Active Line/Drains/Airways    Name:   Placement date:   Placement time:   Site:   Days:   Peripheral IV (Ped) 04/05/18   04/05/18    1130     less than 1          Labs/Imaging Results for orders placed or performed during the hospital encounter of 04/05/18 (from the past 48 hour(s))  CBC with Differential     Status: Abnormal   Collection Time: 04/05/18 10:34 AM  Result Value Ref Range   WBC 9.8 4.0 - 10.5 K/uL   RBC 6.18 (H) 4.22 - 5.81 MIL/uL   Hemoglobin 17.6 (H) 13.0 - 17.0 g/dL   HCT 54.9 (H) 39.0 - 52.0 %   MCV 88.8 80.0 - 100.0 fL   MCH  28.5 26.0 - 34.0 pg   MCHC 32.1 30.0 - 36.0 g/dL   RDW 14.5 11.5 - 15.5 %   Platelets 188 150 - 400 K/uL   nRBC 0.0 0.0 - 0.2 %   Neutrophils Relative % 64 %   Neutro Abs 6.4 1.7 - 7.7 K/uL   Lymphocytes Relative 24 %   Lymphs Abs 2.3 0.7 - 4.0 K/uL   Monocytes Relative 6 %   Monocytes Absolute 0.6 0.1 - 1.0 K/uL   Eosinophils Relative 5 %   Eosinophils Absolute 0.5 0.0 - 0.5 K/uL   Basophils Relative 1 %   Basophils Absolute 0.1 0.0 - 0.1 K/uL   Immature Granulocytes 0 %   Abs Immature Granulocytes 0.02 0.00 - 0.07 K/uL    Comment: Performed at [facility], [place] [address] [place], [place] [address]  Basic metabolic panel     Status: Abnormal   Collection Time: 04/05/18 10:34 AM  Result Value Ref Range   Sodium 137 135 - 145 mmol/L   Potassium 3.0 (L) 3.5 - 5.1 mmol/L   Chloride 101 98 - 111 mmol/L   CO2 25 22 - 32 mmol/L  Glucose, Bld 140 (H) 70 - 99 mg/dL   BUN 7 6 - 20 mg/dL   Creatinine, Ser 0.97 0.61 - 1.24 mg/dL   Calcium 8.8 (L) 8.9 - 10.3 mg/dL   GFR calc non Af Amer >60 >60 mL/min   GFR calc Af Amer >60 >60 mL/min   Anion gap 11 5 - 15    Comment: Performed at [facility], [place] [address] [place], [place] [address]  Magnesium     Status: None   Collection Time: 04/05/18 10:34 AM  Result Value Ref Range   Magnesium 2.0 1.7 - 2.4 mg/dL    Comment: Performed at [facility], [place] [address], [place], [place] [address]  Phosphorus     Status: None   Collection Time: 04/05/18 10:34 AM  Result Value Ref Range   Phosphorus 2.7 2.5 - 4.6 mg/dL    Comment: Performed at [facility], [place] [address] [place], [place] [address]   Dg Chest 1 View  Result Date: 04/05/2018 CLINICAL DATA:  Post thoracentesis, history of COPD EXAM: CHEST  1 VIEW COMPARISON:  None. FINDINGS: RIGHT-sided pacemaker overlies stable enlarged cardiac silhouette. Large RIGHT effusion is decreased slightly in volume compared  to prior. No pneumothorax. IMPRESSION: Decrease in RIGHT effusion.  No pneumothorax. Persistent large RIGHT effusion remains. Electronically Signed   By: Suzy Bouchard M.D.   On: 04/05/2018 13:53   Dg Chest Portable 1 View  Result Date: 04/05/2018 CLINICAL DATA:  Shortness of breath for the past 24 hours. Missed asthma medications yesterday. EXAM: PORTABLE CHEST 1 VIEW COMPARISON:  None. FINDINGS: Large right pleural effusion mild adjacent right lung atelectasis. Clear left lung. Minimal diffuse peribronchial thickening and accentuation of the interstitial markings. The right heart borders are obscured by the pleural fluid with no gross cardiac enlargement. Right subclavian pacemaker leads in satisfactory position. Unremarkable bones. IMPRESSION: 1. Large right pleural effusion with mild adjacent right lung atelectasis. 2. Minimal chronic bronchitic changes. Electronically Signed   By: Claudie Revering M.D.   On: 04/05/2018 10:35   US Thoracentesis Asp Pleural Space W/img Guide  Result Date: 04/05/2018 INDICATION: Patient with history of COPD, asthma, dyspnea, and right pleural effusion. Request is made for diagnostic and therapeutic right thoracentesis. EXAM: ULTRASOUND GUIDED DIAGNOSTIC AND THERAPEUTIC RIGHT THORACENTESIS MEDICATIONS: 10 mL 1% lidocaine COMPLICATIONS: None immediate. PROCEDURE: An ultrasound guided thoracentesis was thoroughly discussed with the patient and questions answered. The benefits, risks, alternatives and complications were also discussed. The patient understands and wishes to proceed with the procedure. Written consent was obtained. Ultrasound was performed to localize and mark an adequate pocket of fluid in the right chest. The area was then prepped and draped in the normal sterile fashion. 1% Lidocaine was used for local anesthesia. Under ultrasound guidance a 6 Fr Safe-T-Centesis catheter was introduced. Thoracentesis was performed. The catheter was removed and a dressing  applied. FINDINGS: A total of approximately 2 L of clear gold fluid was removed. Samples were sent to the laboratory as requested by the clinical team. IMPRESSION: Successful ultrasound guided right thoracentesis yielding 2 L of pleural fluid. Read by: Earley Abide, PA-C Electronically Signed   By: Jerilynn Mages.  Shick M.D.   On: 04/05/2018 14:05   EKG Interpretation  Date/Time:  Thursday April 05 2018 10:10:10 EST Ventricular Rate:  101 PR Interval:    QRS Duration: 96 QT Interval:  357 QTC Calculation: 463 R Axis:   51 Text Interpretation:  Sinus tachycardia Confirmed by Wilson Singer,  Annie Main [phone]) on 04/05/2018 12:51:52 PM   Pending Labs Unresulted Labs (From admission, onward)    Start     Ordered   04/06/18 0500  HIV antibody (Routine Testing)  Tomorrow morning,   R     04/05/18 1239   04/06/18 0500  CBC WITH DIFFERENTIAL  Tomorrow morning,   R     04/05/18 1239   04/06/18 8115  Basic metabolic panel  Tomorrow morning,   R     04/05/18 1239   04/05/18 1325  Lactate dehydrogenase (pleural or peritoneal fluid)  R     04/05/18 1325   04/05/18 1325  Body fluid culture  R     04/05/18 1325   04/05/18 1325  Cholesterol, body fluid  R     04/05/18 1325   04/05/18 1325  Protein, pleural or peritoneal fluid  R     04/05/18 1325   04/05/18 1325  Glucose, pleural or peritoneal fluid  R     04/05/18 1325   04/05/18 1325  PH, Body Fluid  R     04/05/18 1325   04/05/18 1235  Culture, sputum-assessment  Once,   R     04/05/18 1239   04/05/18 1235  Gram stain  Once,   R     04/05/18 1239   04/05/18 1235  Strep pneumoniae urinary antigen  Once,   R     04/05/18 1239   04/05/18 1114  Protein, pleural or peritoneal fluid  Once,   R     04/05/18 1114   04/05/18 1114  Glucose, pleural or peritoneal fluid  Once,   R     04/05/18 1114   04/05/18 1114  Body fluid culture (includes gram stain)  Once,   R    Question:  Are there also cytology or pathology orders on this specimen?  Answer:  Yes    04/05/18 1114   04/05/18 1114  Body fluid cell count with differential  Once,   R    Question:  Are there also cytology or pathology orders on this specimen?  Answer:  Yes   04/05/18 1114   04/05/18 1113  PH, Body Fluid  Once,   R    Comments:  Send specimen on ice.    04/05/18 1114   04/05/18 1113  Cholesterol, body fluid  Once,   R     04/05/18 1114   04/05/18 1113  Triglycerides, Body Fluid  Once,   R     04/05/18 1114   04/05/18 1113  Lactate dehydrogenase (pleural or peritoneal fluid)  Once,   R     04/05/18 1114          Vitals/Pain Today's Vitals   04/05/18 1315 04/05/18 1340 04/05/18 1356 04/05/18 1400  BP: 101/83 102/69  103/66  Pulse:  (!) 107  100  Resp:  19  20  Temp:      TempSrc:      SpO2:  91% 90% 92%  Weight:      Height:      PainSc:        Isolation Precautions No active isolations  Medications Medications  nicotine (NICODERM CQ - dosed in mg/24 hours) patch 21 mg (21 mg Transdermal Patch Applied 04/05/18 1137)  heparin injection 5,000 Units (has no administration in time range)  acetaminophen (TYLENOL) tablet 650 mg (has no administration in time range)    Or  acetaminophen (TYLENOL) suppository 650 mg (has no administration in time range)  cefTRIAXone (  ROCEPHIN) 1 g in sodium chloride 0.9 % 100 mL IVPB (has no administration in time range)  azithromycin (ZITHROMAX) 500 mg in sodium chloride 0.9 % 250 mL IVPB (has no administration in time range)  albuterol (PROVENTIL) (2.5 MG/3ML) 0.083% nebulizer solution 2.5 mg (has no administration in time range)  ipratropium-albuterol (DUONEB) 0.5-2.5 (3) MG/3ML nebulizer solution 3 mL (3 mLs Nebulization Given 04/05/18 1355)  cefTRIAXone (ROCEPHIN) 1 g in sodium chloride 0.9 % 100 mL IVPB (1 g Intravenous New Bag/Given 04/05/18 1137)  azithromycin (ZITHROMAX) 500 mg in sodium chloride 0.9 % 250 mL IVPB (500 mg Intravenous New Bag/Given 04/05/18 1140)  LORazepam (ATIVAN) injection 1 mg (1 mg Intravenous Given  04/05/18 1137)  potassium chloride SA (K-DUR,KLOR-CON) CR tablet 60 mEq (60 mEq Oral Given 04/05/18 1136)    Mobility walks with person assist

## 2018-04-05 NOTE — ED Triage Notes (Signed)
Per [facility] pt has COPD and asthma and been having SOB x 24 hours.  Missed medications yesterday and inhaler is expired and missing.  Pt has cough as well.   Pt given Albuterol 5mg  initially then given another Albuterol 5mg  with Atrovent and Solumedrol 125mg  via IV 20g in left hand.   Vitals: 161/125BP, CBG 106

## 2018-04-06 ENCOUNTER — Inpatient Hospital Stay (HOSPITAL_COMMUNITY): Payer: No Typology Code available for payment source

## 2018-04-06 DIAGNOSIS — G4733 Obstructive sleep apnea (adult) (pediatric): Secondary | ICD-10-CM | POA: Diagnosis present

## 2018-04-06 DIAGNOSIS — I370 Nonrheumatic pulmonary valve stenosis: Secondary | ICD-10-CM | POA: Diagnosis not present

## 2018-04-06 DIAGNOSIS — J441 Chronic obstructive pulmonary disease with (acute) exacerbation: Secondary | ICD-10-CM | POA: Diagnosis present

## 2018-04-06 DIAGNOSIS — F4310 Post-traumatic stress disorder, unspecified: Secondary | ICD-10-CM | POA: Diagnosis present

## 2018-04-06 DIAGNOSIS — E876 Hypokalemia: Secondary | ICD-10-CM | POA: Diagnosis present

## 2018-04-06 DIAGNOSIS — J189 Pneumonia, unspecified organism: Secondary | ICD-10-CM | POA: Diagnosis present

## 2018-04-06 DIAGNOSIS — J9601 Acute respiratory failure with hypoxia: Secondary | ICD-10-CM | POA: Diagnosis present

## 2018-04-06 DIAGNOSIS — Z9119 Patient's noncompliance with other medical treatment and regimen: Secondary | ICD-10-CM | POA: Diagnosis not present

## 2018-04-06 DIAGNOSIS — Z79899 Other long term (current) drug therapy: Secondary | ICD-10-CM | POA: Diagnosis not present

## 2018-04-06 DIAGNOSIS — R413 Other amnesia: Secondary | ICD-10-CM | POA: Diagnosis present

## 2018-04-06 DIAGNOSIS — E785 Hyperlipidemia, unspecified: Secondary | ICD-10-CM | POA: Diagnosis present

## 2018-04-06 DIAGNOSIS — D751 Secondary polycythemia: Secondary | ICD-10-CM | POA: Diagnosis present

## 2018-04-06 DIAGNOSIS — J440 Chronic obstructive pulmonary disease with acute lower respiratory infection: Secondary | ICD-10-CM | POA: Diagnosis present

## 2018-04-06 DIAGNOSIS — I10 Essential (primary) hypertension: Secondary | ICD-10-CM | POA: Diagnosis present

## 2018-04-06 DIAGNOSIS — I361 Nonrheumatic tricuspid (valve) insufficiency: Secondary | ICD-10-CM | POA: Diagnosis not present

## 2018-04-06 DIAGNOSIS — G40A09 Absence epileptic syndrome, not intractable, without status epilepticus: Secondary | ICD-10-CM | POA: Diagnosis present

## 2018-04-06 DIAGNOSIS — Z720 Tobacco use: Secondary | ICD-10-CM | POA: Diagnosis not present

## 2018-04-06 DIAGNOSIS — K219 Gastro-esophageal reflux disease without esophagitis: Secondary | ICD-10-CM | POA: Diagnosis present

## 2018-04-06 DIAGNOSIS — Z7982 Long term (current) use of aspirin: Secondary | ICD-10-CM | POA: Diagnosis not present

## 2018-04-06 DIAGNOSIS — J90 Pleural effusion, not elsewhere classified: Secondary | ICD-10-CM | POA: Diagnosis present

## 2018-04-06 LAB — CBC WITH DIFFERENTIAL/PLATELET
Abs Immature Granulocytes: 0.21 10*3/uL — ABNORMAL HIGH (ref 0.00–0.07)
Basophils Absolute: 0 10*3/uL (ref 0.0–0.1)
Basophils Relative: 0 %
Eosinophils Absolute: 0 10*3/uL (ref 0.0–0.5)
Eosinophils Relative: 0 %
HCT: 50.7 % (ref 39.0–52.0)
HEMOGLOBIN: 16.2 g/dL (ref 13.0–17.0)
Immature Granulocytes: 1 %
LYMPHS PCT: 5 %
Lymphs Abs: 1.1 10*3/uL (ref 0.7–4.0)
MCH: 29.2 pg (ref 26.0–34.0)
MCHC: 32 g/dL (ref 30.0–36.0)
MCV: 91.5 fL (ref 80.0–100.0)
Monocytes Absolute: 0.5 10*3/uL (ref 0.1–1.0)
Monocytes Relative: 2 %
Neutro Abs: 18.7 10*3/uL — ABNORMAL HIGH (ref 1.7–7.7)
Neutrophils Relative %: 92 %
Platelets: 188 10*3/uL (ref 150–400)
RBC: 5.54 MIL/uL (ref 4.22–5.81)
RDW: 14.9 % (ref 11.5–15.5)
WBC: 20.5 10*3/uL — ABNORMAL HIGH (ref 4.0–10.5)
nRBC: 0 % (ref 0.0–0.2)

## 2018-04-06 LAB — PH, BODY FLUID: pH, Body Fluid: 7.4

## 2018-04-06 LAB — BASIC METABOLIC PANEL
Anion gap: 10 (ref 5–15)
BUN: 12 mg/dL (ref 6–20)
CO2: 22 mmol/L (ref 22–32)
Calcium: 9.1 mg/dL (ref 8.9–10.3)
Chloride: 104 mmol/L (ref 98–111)
Creatinine, Ser: 0.98 mg/dL (ref 0.61–1.24)
GFR calc Af Amer: >60 mL/min (ref >60)
GFR calc non Af Amer: >60 mL/min (ref >60)
Glucose, Bld: 138 mg/dL — ABNORMAL HIGH (ref 70–99)
Potassium: 4.2 mmol/L (ref 3.5–5.1)
Sodium: 136 mmol/L (ref 135–145)

## 2018-04-06 LAB — HIV ANTIBODY (ROUTINE TESTING W REFLEX): HIV Screen 4th Generation wRfx: NONREACTIVE

## 2018-04-06 LAB — PROCALCITONIN: Procalcitonin: <0.1 ng/mL

## 2018-04-06 LAB — BRAIN NATRIURETIC PEPTIDE: B Natriuretic Peptide: 74.1 pg/mL (ref 0.0–100.0)

## 2018-04-06 LAB — TRIGLYCERIDES, BODY FLUIDS: Triglycerides, Fluid: 38 mg/dL

## 2018-04-06 MED ORDER — DONEPEZIL HCL 5 MG PO TABS
5.0000 mg | ORAL_TABLET | Freq: Every day | ORAL | Status: DC
  Administered 2018-04-06 – 2018-04-08 (×3): 5 mg via ORAL
  Filled 2018-04-06 (×3): qty 1

## 2018-04-06 MED ORDER — NICOTINE 21 MG/24HR TD PT24
21.0000 mg | MEDICATED_PATCH | Freq: Every day | TRANSDERMAL | Status: DC
  Administered 2018-04-06 – 2018-04-09 (×4): 21 mg via TRANSDERMAL
  Filled 2018-04-06 (×4): qty 1

## 2018-04-06 MED ORDER — CYANOCOBALAMIN 500 MCG PO TABS
500.0000 ug | ORAL_TABLET | Freq: Every day | ORAL | Status: DC
  Administered 2018-04-06 – 2018-04-09 (×4): 500 ug via ORAL
  Filled 2018-04-06 (×4): qty 1

## 2018-04-06 MED ORDER — BUSPIRONE HCL 5 MG PO TABS
5.0000 mg | ORAL_TABLET | Freq: Two times a day (BID) | ORAL | Status: DC
  Administered 2018-04-06 – 2018-04-09 (×6): 5 mg via ORAL
  Filled 2018-04-06 (×6): qty 1

## 2018-04-06 MED ORDER — LISINOPRIL 5 MG PO TABS
5.0000 mg | ORAL_TABLET | Freq: Every day | ORAL | Status: DC
  Administered 2018-04-06 – 2018-04-09 (×4): 5 mg via ORAL
  Filled 2018-04-06 (×4): qty 1

## 2018-04-06 MED ORDER — ORAL CARE MOUTH RINSE
15.0000 mL | Freq: Two times a day (BID) | OROMUCOSAL | Status: DC
  Administered 2018-04-07 – 2018-04-09 (×5): 15 mL via OROMUCOSAL

## 2018-04-06 MED ORDER — AZITHROMYCIN 250 MG PO TABS
500.0000 mg | ORAL_TABLET | Freq: Every day | ORAL | Status: DC
  Administered 2018-04-06 – 2018-04-09 (×4): 500 mg via ORAL
  Filled 2018-04-06 (×4): qty 2

## 2018-04-06 MED ORDER — ESCITALOPRAM OXALATE 20 MG PO TABS
20.0000 mg | ORAL_TABLET | Freq: Every day | ORAL | Status: DC
  Administered 2018-04-06 – 2018-04-09 (×4): 20 mg via ORAL
  Filled 2018-04-06 (×4): qty 1

## 2018-04-06 MED ORDER — ZOLPIDEM TARTRATE 5 MG PO TABS
5.0000 mg | ORAL_TABLET | Freq: Once | ORAL | Status: AC
  Administered 2018-04-06: 5 mg via ORAL
  Filled 2018-04-06: qty 1

## 2018-04-06 MED ORDER — PANTOPRAZOLE SODIUM 40 MG PO TBEC
40.0000 mg | DELAYED_RELEASE_TABLET | Freq: Every day | ORAL | Status: DC
  Administered 2018-04-06 – 2018-04-09 (×4): 40 mg via ORAL
  Filled 2018-04-06 (×4): qty 1

## 2018-04-06 MED ORDER — ATORVASTATIN CALCIUM 40 MG PO TABS
40.0000 mg | ORAL_TABLET | Freq: Every day | ORAL | Status: DC
  Administered 2018-04-06 – 2018-04-08 (×3): 40 mg via ORAL
  Filled 2018-04-06 (×3): qty 1

## 2018-04-06 MED ORDER — VITAMIN D 25 MCG (1000 UNIT) PO TABS
1000.0000 [IU] | ORAL_TABLET | Freq: Every day | ORAL | Status: DC
  Administered 2018-04-06 – 2018-04-09 (×4): 1000 [IU] via ORAL
  Filled 2018-04-06 (×5): qty 1

## 2018-04-06 NOTE — Progress Notes (Signed)
PROGRESS NOTE  Daniel Bryant JHE:[id number] DOB: April 29, 1960 DOA: 04/05/2018 PCP: Patient, No Pcp Per  HPI/Recap of past 24 hours:  Daniel Bryant is a 58 y.o. male with medical history significant of asthma/COPD, GERD, hyperlipidemia, history of petit mal seizures in remission, hyperlipidemia, hypertension, memory impairment, OSA not on CPAP, history of pacemaker placement due to sick sinus syndrome, PTSD who is coming to the emergency department due to progressively worsening dyspnea associated with productive cough for yellowish sputum, wheezing, fatigue and malaise for the past 24 hours.  He was given albuterol 5 mg neb x2, Atrovent 0.5 mg x 1, Solu-Medrol 125 mg IVP by the EMS crew.  He is unable to provide further history after he was sedated with lorazepam 1 mg IVP prior to undergoing thoracentesis.  04/06/2018: Patient seen and examined at his bedside.  Reports persistent dyspnea with minimal movement.  Had right thoracentesis done with 2 L of clear fluid removed.  Independent review chest x-ray postthoracentesis which revealed persistent large right pleural effusion.  May need another thoracentesis prior to discharge.  Suspect secondary to heart failure.  2D echo ordered and pending.   Assessment/Plan: Principal Problem:   Pleural effusion on right Active Problems:   Hypokalemia   High cholesterol   Hypertension   COPD with acute exacerbation (HCC)   Polycythemia   CAP (community acquired pneumonia)  Pleural effusion on right   CAP (community acquired pneumonia) 2 L clear fluid removed from thoracentesis possibly secondary to heart failure Obtain 2D echo Procalcitonin negative On antibiotics empirically WBC trending up to 20 K Continue ceftriaxone 1 g IVPB every 24 hours. Continue Zithromax 500 mg IVPB every 24 hours. Follow-up pleural fluid culture and sensitivity. Check sputum Gram stain, culture and sensitivity. Check strep pneumonia urinary antigen. Continue COPD  exacerbation treatment. Independently reviewed chest x-ray done postthoracentesis which revealed persistent large pleural effusion May need another thoracentesis prior to discharge  Acute hypoxic respiratory failure secondary to large right pleural effusion versus others Maintain O2 saturation greater than 92% Currently requiring 3 L of oxygen by nasal cannula Management as stated above  Active Problems:   COPD with acute exacerbation (HCC) Continue supplemental oxygen. DuoNeb every 6 hours. Albuterol 2.5 mg neb every 4 hours as needed. Solu-Medrol 40 mg IVP every 6 hours. Switch to oral prednisone after Solu-Medrol completed.   Resolved Hypokalemia post repletion    High cholesterol Continue atorvastatin 40 mg p.o. daily. Fasting lipid panel follow-up as an outpatient. Monitor LFTs as needed.    Hypertension Continue lisinopril 5 mg p.o. daily. Monitor BP, electrolytes and renal function.    Polycythemia Secondary to COPD and tobacco use. Monitor hematocrit and hemoglobin. Continue aspirin 81 mg p.o. daily.  Risks: High risk for decompensation due to acute hypoxic respiratory failure secondary to large right pleural effusion, multiple comorbidities and advanced age.  The patient will require at least 2 midnights for further evaluation and treatment of present condition.   DVT prophylaxis: Heparin SQ. Code Status: Full code. Family Communication:  Disposition Plan: Admit for IV antibiotic therapy for 2 to 3 days. Consults called: IR performed diagnostic thoracentesis.        Objective: Vitals:   04/06/18 0430 04/06/18 0833 04/06/18 0900 04/06/18 1351  BP: (!) 142/76  (!) 176/96 (!) 146/89  Pulse: 92  92 100  Resp: 20  18 (!) 22  Temp: 97.7 F (36.5 C)  98.1 F (36.7 C) 97.9 F (36.6 C)  TempSrc: Oral  Oral  SpO2: 97% 96% 93% 95%  Weight:      Height:        Intake/Output Summary (Last 24 hours) at 04/06/2018 1426 Last data filed at 04/06/2018  1051 Gross per 24 hour  Intake 1415.34 ml  Output -  Net 1415.34 ml   Filed Weights   04/05/18 1003  Weight: 111.2 kg    Exam:  . General: 58 y.o. year-old male well developed well nourished in no acute distress.  Alert and oriented x3. . Cardiovascular: Regular rate and rhythm with no rubs or gallops.  No thyromegaly noted.  Mild right JVD noted . Respiratory: Diffuse rales bilaterally. Good inspiratory effort. . Abdomen: Soft nontender nondistended with normal bowel sounds x4 quadrants. . Musculoskeletal: Trace lower extremity edema. 2/4 pulses in all 4 extremities. . Skin: No ulcerative lesions noted or rashes, . Psychiatry: Mood is appropriate for condition and setting   Data Reviewed: CBC: Recent Labs  Lab 04/05/18 1034 04/06/18 0601  WBC 9.8 20.5*  NEUTROABS 6.4 18.7*  HGB 17.6* 16.2  HCT 54.9* 50.7  MCV 88.8 91.5  PLT 188 109   Basic Metabolic Panel: Recent Labs  Lab 04/05/18 1034 04/06/18 0601  NA 137 136  K 3.0* 4.2  CL 101 104  CO2 25 22  GLUCOSE 140* 138*  BUN 7 12  CREATININE 0.97 0.98  CALCIUM 8.8* 9.1  MG 2.0  --   PHOS 2.7  --    GFR: Estimated Creatinine Clearance: 103.2 mL/min (by C-G formula based on SCr of 0.98 mg/dL). Liver Function Tests: No results for input(s): AST, ALT, ALKPHOS, BILITOT, PROT, ALBUMIN in the last 168 hours. No results for input(s): LIPASE, AMYLASE in the last 168 hours. No results for input(s): AMMONIA in the last 168 hours. Coagulation Profile: No results for input(s): INR, PROTIME in the last 168 hours. Cardiac Enzymes: No results for input(s): CKTOTAL, CKMB, CKMBINDEX, TROPONINI in the last 168 hours. BNP (last 3 results) No results for input(s): PROBNP in the last 8760 hours. HbA1C: No results for input(s): HGBA1C in the last 72 hours. CBG: No results for input(s): GLUCAP in the last 168 hours. Lipid Profile: No results for input(s): CHOL, HDL, LDLCALC, TRIG, CHOLHDL, LDLDIRECT in the last 72  hours. Thyroid Function Tests: No results for input(s): TSH, T4TOTAL, FREET4, T3FREE, THYROIDAB in the last 72 hours. Anemia Panel: No results for input(s): VITAMINB12, FOLATE, FERRITIN, TIBC, IRON, RETICCTPCT in the last 72 hours. Urine analysis: No results found for: COLORURINE, APPEARANCEUR, LABSPEC, PHURINE, GLUCOSEU, HGBUR, BILIRUBINUR, KETONESUR, PROTEINUR, UROBILINOGEN, NITRITE, LEUKOCYTESUR Sepsis Labs: @LABRCNTIP (procalcitonin:4,lacticidven:4)  ) Recent Results (from the past 240 hour(s))  Body fluid culture (includes gram stain)     Status: None (Preliminary result)   Collection Time: 04/05/18  1:25 PM  Result Value Ref Range Status   Specimen Description   Final    PLEURAL RIGHT Performed at [facility] [address], [place], [place] [address]    Special Requests   Final    NONE Performed at [facility], [place] [address] [place], [place] [address]    Gram Stain   Final    FEW WBC PRESENT, PREDOMINANTLY MONONUCLEAR NO ORGANISMS SEEN    Culture   Final    NO GROWTH < 24 HOURS Performed at [facility] [address], [place], [place] [address]    Report Status PENDING  Incomplete      Studies: No results found.  Scheduled Meds: . aspirin EC  81 mg Oral Daily  . azithromycin  500 mg Oral Daily  . cholecalciferol  1,000 Units Oral Daily  . donepezil  5 mg Oral QHS  . escitalopram  20 mg Oral Daily  . heparin  5,000 Units Subcutaneous Q8H  . ipratropium-albuterol  3 mL Nebulization BID  . lisinopril  5 mg Oral Daily  . methylPREDNISolone (SOLU-MEDROL) injection  40 mg Intravenous Q6H   Followed by  . [START ON 04/07/2018] predniSONE  40 mg Oral Q breakfast  . pantoprazole  40 mg Oral Daily  . vitamin B-12  500 mcg Oral Daily    Continuous Infusions: . 0.45 % NaCl with KCl 20 mEq / L 100 mL/hr at 04/05/18 2133  . cefTRIAXone (ROCEPHIN)  IV 1 g (04/06/18 1007)     LOS: 0 days     Kayleen Memos,  MD [facility] Pager [phone]  If 7PM-7AM, please contact night-coverage [email or web address] Password [email or web address] 04/06/2018, 2:26 PM

## 2018-04-06 NOTE — Progress Notes (Signed)
Pt with 6-beat run of vtach. Asleep with int periods of apnea. Vitals wnl. On call Bodenheimer made aware. No new orders given. Will cont to monitor. VWilliams,rn.

## 2018-04-06 NOTE — Progress Notes (Signed)
PHARMACIST - PHYSICIAN COMMUNICATION CONCERNING: Antibiotic IV to Oral Route Change Policy  RECOMMENDATION: This patient is receiving azithromycin by the intravenous route.  Based on criteria approved by the Pharmacy and Therapeutics Committee, the antibiotic(s) is/are being converted to the equivalent oral dose form(s).   DESCRIPTION: These criteria include:  Patient being treated for a respiratory tract infection, urinary tract infection, cellulitis or clostridium difficile associated diarrhea if on metronidazole  The patient is not neutropenic and does not exhibit a GI malabsorption state  The patient is eating (either orally or via tube) and/or has been taking other orally administered medications for a least 24 hours  The patient is improving clinically and has a Tmax < 100.5  If you have questions about this conversion, please contact the Pharmacy Department  []   [phone] )  [facility] []   [phone] )  [facility] []   [phone] )  [facility] []   [phone] )  [facility] [x]   [phone] )  [place], [place].D [phone] 04/06/2018 8:45 AM

## 2018-04-07 ENCOUNTER — Inpatient Hospital Stay (HOSPITAL_COMMUNITY): Payer: No Typology Code available for payment source

## 2018-04-07 DIAGNOSIS — I361 Nonrheumatic tricuspid (valve) insufficiency: Secondary | ICD-10-CM

## 2018-04-07 DIAGNOSIS — I370 Nonrheumatic pulmonary valve stenosis: Secondary | ICD-10-CM

## 2018-04-07 LAB — BASIC METABOLIC PANEL
Anion gap: 9 (ref 5–15)
BUN: 20 mg/dL (ref 6–20)
CHLORIDE: 102 mmol/L (ref 98–111)
CO2: 27 mmol/L (ref 22–32)
Calcium: 9 mg/dL (ref 8.9–10.3)
Creatinine, Ser: 0.89 mg/dL (ref 0.61–1.24)
GFR calc Af Amer: >60 mL/min (ref >60)
GFR calc non Af Amer: >60 mL/min (ref >60)
Glucose, Bld: 109 mg/dL — ABNORMAL HIGH (ref 70–99)
Potassium: 4.4 mmol/L (ref 3.5–5.1)
Sodium: 138 mmol/L (ref 135–145)

## 2018-04-07 LAB — ECHOCARDIOGRAM COMPLETE
Height: 72 in
Weight: 3922 oz

## 2018-04-07 LAB — CBC
HCT: 51.4 % (ref 39.0–52.0)
Hemoglobin: 16.1 g/dL (ref 13.0–17.0)
MCH: 28.6 pg (ref 26.0–34.0)
MCHC: 31.3 g/dL (ref 30.0–36.0)
MCV: 91.5 fL (ref 80.0–100.0)
Platelets: 182 10*3/uL (ref 150–400)
RBC: 5.62 MIL/uL (ref 4.22–5.81)
RDW: 14.7 % (ref 11.5–15.5)
WBC: 20.5 10*3/uL — ABNORMAL HIGH (ref 4.0–10.5)
nRBC: 0 % (ref 0.0–0.2)

## 2018-04-07 LAB — GLUCOSE, CAPILLARY: Glucose-Capillary: 135 mg/dL — ABNORMAL HIGH (ref 70–99)

## 2018-04-07 NOTE — Progress Notes (Signed)
  Echocardiogram 2D Echocardiogram has been performed.  Darlina Sicilian M 04/07/2018, 9:02 AM

## 2018-04-07 NOTE — Progress Notes (Signed)
PROGRESS NOTE  Daniel Bryant PYK:[id number] DOB: Mar 27, 1960 DOA: 04/05/2018 PCP: Patient, No Pcp Per  HPI/Recap of past 24 hours:  Daniel Bryant is a 58 y.o. male with medical history significant of asthma/COPD, GERD, hyperlipidemia, history of petit mal seizures in remission, hyperlipidemia, hypertension, memory impairment, OSA not on CPAP, history of pacemaker placement due to sick sinus syndrome, PTSD who is coming to the emergency department due to progressively worsening dyspnea associated with productive cough for yellowish sputum, wheezing, fatigue and malaise for the past 24 hours.  He was given albuterol 5 mg neb x2, Atrovent 0.5 mg x 1, Solu-Medrol 125 mg IVP by the EMS crew.  He is unable to provide further history after he was sedated with lorazepam 1 mg IVP prior to undergoing thoracentesis.  04/06/2018: Patient seen and examined at his bedside.  Reports persistent dyspnea with minimal movement.  Had right thoracentesis done with 2 L of clear fluid removed.  Independent review chest x-ray postthoracentesis which revealed persistent large right pleural effusion.  May need another thoracentesis prior to discharge.  Suspect secondary to heart failure.  2D echo ordered and pending.  04/07/2018: Patient seen and examined at his bedside.  He is somnolent however easily arousable to voices.  No acute events overnight.  Reports dyspnea with ambulation.  Denies chest pain.   Assessment/Plan: Principal Problem:   Pleural effusion on right Active Problems:   Hypokalemia   High cholesterol   Hypertension   COPD with acute exacerbation (HCC)   Polycythemia   CAP (community acquired pneumonia)  Pleural effusion on right   CAP (community acquired pneumonia) 2 L clear fluid removed from thoracentesis possibly secondary to heart failure 2D echo is pending Procalcitonin negative On antibiotics empirically Persistent leukocytosis with WBC 20 K Continue ceftriaxone 1 g IVPB every 24  hours. Continue Zithromax 500 mg IVPB every 24 hours. Follow-up pleural fluid culture and sensitivity. Check sputum Gram stain, culture and sensitivity. Check strep pneumonia urinary antigen. Continue COPD exacerbation treatment. Independently reviewed chest x-ray done postthoracentesis which revealed persistent large pleural effusion May need another thoracentesis prior to discharge  Acute hypoxic respiratory failure secondary to large right pleural effusion versus others Maintain O2 saturation greater than 92% Currently requiring 3 L of oxygen by nasal cannula Management as stated above Continue current management Possible repeat thoracentesis tomorrow  Active Problems:   COPD with acute exacerbation (HCC) Continue supplemental oxygen. DuoNeb every 6 hours. Albuterol 2.5 mg neb every 4 hours as needed. Continue Solu-Medrol 40 mg IVP every 6 hours. Switch to oral prednisone after Solu-Medrol completed.   Resolved Hypokalemia post repletion    High cholesterol Continue atorvastatin 40 mg p.o. daily. Fasting lipid panel follow-up as an outpatient. Monitor LFTs as needed.    Hypertension Continue lisinopril 5 mg p.o. daily. Monitor BP, electrolytes and renal function.    Polycythemia Secondary to COPD and tobacco use. Monitor hematocrit and hemoglobin. Continue aspirin 81 mg p.o. daily.  Risks: High risk for decompensation due to acute hypoxic respiratory failure secondary to large right pleural effusion, multiple comorbidities and advanced age.  The patient will require at least 2 midnights for further evaluation and treatment of present condition.   DVT prophylaxis: Heparin SQ 3 times daily. Code Status: Full code. Family Communication:  Disposition Plan: Admit for IV antibiotic therapy for 2 to 3 days. Consults called: IR performed diagnostic thoracentesis.        Objective: Vitals:   04/07/18 0610 04/07/18 0800 04/07/18 1101 04/07/18  1346  BP: (!)  160/105 133/90  125/83  Pulse: 89 73 81 68  Resp: (!) 24 20 17 20   Temp: 97.7 F (36.5 C) 98.1 F (36.7 C)  97.9 F (36.6 C)  TempSrc:  Oral  Oral  SpO2: 97% 95% 96% 95%  Weight:      Height:        Intake/Output Summary (Last 24 hours) at 04/07/2018 1448 Last data filed at 04/06/2018 1500 Gross per 24 hour  Intake 866.87 ml  Output -  Net 866.87 ml   Filed Weights   04/05/18 1003  Weight: 111.2 kg    Exam:  . General: 58 y.o. year-old male obese in no acute distress.  Somnolent but easily arousable to voices. . Cardiovascular: Regular rate and rhythm with no rubs or gallops.  No JVD or thyromegaly noted.  Marland Kitchen Respiratory: Mild rales at bases bilaterally.  Poor inspiratory effort. . Abdomen: Soft nontender nondistended with normal bowel sounds x4 quadrants. . Musculoskeletal: Trace lower extremity edema. 2/4 pulses in all 4 extremities. Marland Kitchen Psychiatry: Mood is appropriate for condition and setting   Data Reviewed: CBC: Recent Labs  Lab 04/05/18 1034 04/06/18 0601 04/07/18 0607  WBC 9.8 20.5* 20.5*  NEUTROABS 6.4 18.7*  --   HGB 17.6* 16.2 16.1  HCT 54.9* 50.7 51.4  MCV 88.8 91.5 91.5  PLT 188 188 409   Basic Metabolic Panel: Recent Labs  Lab 04/05/18 1034 04/06/18 0601 04/07/18 0607  NA 137 136 138  K 3.0* 4.2 4.4  CL 101 104 102  CO2 25 22 27   GLUCOSE 140* 138* 109*  BUN 7 12 20   CREATININE 0.97 0.98 0.89  CALCIUM 8.8* 9.1 9.0  MG 2.0  --   --   PHOS 2.7  --   --    GFR: Estimated Creatinine Clearance: 113.6 mL/min (by C-G formula based on SCr of 0.89 mg/dL). Liver Function Tests: No results for input(s): AST, ALT, ALKPHOS, BILITOT, PROT, ALBUMIN in the last 168 hours. No results for input(s): LIPASE, AMYLASE in the last 168 hours. No results for input(s): AMMONIA in the last 168 hours. Coagulation Profile: No results for input(s): INR, PROTIME in the last 168 hours. Cardiac Enzymes: No results for input(s): CKTOTAL, CKMB, CKMBINDEX, TROPONINI in  the last 168 hours. BNP (last 3 results) No results for input(s): PROBNP in the last 8760 hours. HbA1C: No results for input(s): HGBA1C in the last 72 hours. CBG: No results for input(s): GLUCAP in the last 168 hours. Lipid Profile: No results for input(s): CHOL, HDL, LDLCALC, TRIG, CHOLHDL, LDLDIRECT in the last 72 hours. Thyroid Function Tests: No results for input(s): TSH, T4TOTAL, FREET4, T3FREE, THYROIDAB in the last 72 hours. Anemia Panel: No results for input(s): VITAMINB12, FOLATE, FERRITIN, TIBC, IRON, RETICCTPCT in the last 72 hours. Urine analysis: No results found for: COLORURINE, APPEARANCEUR, LABSPEC, PHURINE, GLUCOSEU, HGBUR, BILIRUBINUR, KETONESUR, PROTEINUR, UROBILINOGEN, NITRITE, LEUKOCYTESUR Sepsis Labs: @LABRCNTIP (procalcitonin:4,lacticidven:4)  ) Recent Results (from the past 240 hour(s))  Body fluid culture (includes gram stain)     Status: None (Preliminary result)   Collection Time: 04/05/18  1:25 PM  Result Value Ref Range Status   Specimen Description   Final    PLEURAL RIGHT Performed at [facility] [address] [place], [place] [address]    Special Requests   Final    NONE Performed at [facility], [place] [address] [place], [place] [address]    Gram Stain   Final  FEW WBC PRESENT, PREDOMINANTLY MONONUCLEAR NO ORGANISMS SEEN    Culture   Final    NO GROWTH 2 DAYS Performed at [facility], [place] [address], [place], [place] [address]    Report Status PENDING  Incomplete      Studies: No results found.  Scheduled Meds: . aspirin EC  81 mg Oral Daily  . atorvastatin  40 mg Oral QHS  . azithromycin  500 mg Oral Daily  . busPIRone  5 mg Oral BID  . cholecalciferol  1,000 Units Oral Daily  . donepezil  5 mg Oral QHS  . escitalopram  20 mg Oral Daily  . heparin  5,000 Units Subcutaneous Q8H  . ipratropium-albuterol  3 mL Nebulization BID  . lisinopril  5 mg Oral Daily  . mouth rinse  15 mL  Mouth Rinse BID  . nicotine  21 mg Transdermal Daily  . pantoprazole  40 mg Oral Daily  . predniSONE  40 mg Oral Q breakfast  . vitamin B-12  500 mcg Oral Daily    Continuous Infusions: . cefTRIAXone (ROCEPHIN)  IV 1 g (04/07/18 0951)     LOS: 1 day     Kayleen Memos, MD [facility] Pager [phone]  If 7PM-7AM, please contact night-coverage [email or web address] Password [facility] 04/07/2018, 2:48 PM

## 2018-04-08 ENCOUNTER — Inpatient Hospital Stay (HOSPITAL_COMMUNITY): Payer: No Typology Code available for payment source

## 2018-04-08 LAB — CBC
HCT: 52.3 % — ABNORMAL HIGH (ref 39.0–52.0)
Hemoglobin: 16.1 g/dL (ref 13.0–17.0)
MCH: 28.9 pg (ref 26.0–34.0)
MCHC: 30.8 g/dL (ref 30.0–36.0)
MCV: 93.7 fL (ref 80.0–100.0)
Platelets: 142 10*3/uL — ABNORMAL LOW (ref 150–400)
RBC: 5.58 MIL/uL (ref 4.22–5.81)
RDW: 14.6 % (ref 11.5–15.5)
WBC: 12.5 10*3/uL — ABNORMAL HIGH (ref 4.0–10.5)
nRBC: 0 % (ref 0.0–0.2)

## 2018-04-08 LAB — CHOLESTEROL, BODY FLUID: Cholesterol, Fluid: 96 mg/dL

## 2018-04-08 LAB — BASIC METABOLIC PANEL
Anion gap: 8 (ref 5–15)
BUN: 19 mg/dL (ref 6–20)
CO2: 28 mmol/L (ref 22–32)
Calcium: 8.5 mg/dL — ABNORMAL LOW (ref 8.9–10.3)
Chloride: 103 mmol/L (ref 98–111)
Creatinine, Ser: 0.96 mg/dL (ref 0.61–1.24)
GFR calc Af Amer: >60 mL/min (ref >60)
GFR calc non Af Amer: >60 mL/min (ref >60)
Glucose, Bld: 92 mg/dL (ref 70–99)
Potassium: 3.9 mmol/L (ref 3.5–5.1)
Sodium: 139 mmol/L (ref 135–145)

## 2018-04-08 MED ORDER — LIDOCAINE HCL 1 % IJ SOLN
INTRAMUSCULAR | Status: AC
  Filled 2018-04-08: qty 20

## 2018-04-08 NOTE — Progress Notes (Addendum)
PROGRESS NOTE  Daniel Bryant VZC:[id number] DOB: [birth year] DOA: 04/05/2018 PCP: Patient, No Pcp Per  HPI/Recap of past 24 hours:  Daniel Bryant is a 58 y.o. male with medical history significant of asthma/COPD, GERD, hyperlipidemia, history of petit mal seizures in remission, hyperlipidemia, hypertension, memory impairment, OSA not on CPAP, history of pacemaker placement due to sick sinus syndrome, PTSD who is coming to the emergency department due to progressively worsening dyspnea associated with productive cough for yellowish sputum, wheezing, fatigue and malaise for the past 24 hours.  He was given albuterol 5 mg neb x2, Atrovent 0.5 mg x 1, Solu-Medrol 125 mg IVP by the EMS crew.  He is unable to provide further history after he was sedated with lorazepam 1 mg IVP prior to undergoing thoracentesis.  04/06/2018: Patient seen and examined at his bedside.  Reports persistent dyspnea with minimal movement.  Had right thoracentesis done with 2 L of clear fluid removed.  Independent review chest x-ray postthoracentesis which revealed persistent large right pleural effusion.  May need another thoracentesis prior to discharge.  Suspect secondary to heart failure.  2D echo ordered and pending.  04/07/2018: Patient seen and examined at his bedside.  He is somnolent however easily arousable to voices.  No acute events overnight.  Reports dyspnea with ambulation.  Denies chest pain.  04/08/2018: Patient seen and examined at bedside.  No acute events overnight.  States he is breathing better.  Still some dyspnea with ambulation.  Will obtain a chest x-ray and attempt for a repeat right thoracentesis prior to discharge.  Will discharge possibly tomorrow 04/09/2018.   Assessment/Plan: Principal Problem:   Pleural effusion on right Active Problems:   Hypokalemia   High cholesterol   Hypertension   COPD with acute exacerbation (HCC)   Polycythemia   CAP (community acquired pneumonia)  Pleural  effusion on right suspect secondary to CAP (community acquired pneumonia) 2 L clear fluid removed from thoracentesis possibly secondary to heart failure 2D echo is pending Procalcitonin negative Body fluid culture no growth 2D echo done on 04/07/2018 revealed normal LVEF 60 to 65% On antibiotics empirically for community-acquired pneumonia Leukocytosis improving WBC 12 K from 20 K Will start p.o. antibiotics tomorrow for discharge planning Repeat chest x-ray on 04/08/2018 Attempt to repeat right thoracentesis today, ultrasound right thoracentesis ordered  Acute hypoxic respiratory failure secondary to large right pleural effusion versus community-acquired pneumonia versus others Maintain O2 saturation greater than 92% Currently requiring 3 L of oxygen by nasal cannula Management as stated above Continue current management Possible repeat thoracentesis tomorrow  Active Problems:   COPD with acute exacerbation (HCC) Continue supplemental oxygen. DuoNeb every 6 hours. Albuterol 2.5 mg neb every 4 hours as needed. Stop Solu-Medrol 40 mg IVP every 6 hours. Start prednisone 40 mg daily x5 days   Resolved Hypokalemia post repletion    High cholesterol Continue atorvastatin 40 mg p.o. daily. Fasting lipid panel follow-up as an outpatient. Monitor LFTs as needed.    Hypertension Continue lisinopril 5 mg p.o. daily. Monitor BP, electrolytes and renal function.    Polycythemia Secondary to COPD and tobacco use. Monitor hematocrit and hemoglobin. Continue aspirin 81 mg p.o. daily.    DVT prophylaxis: Heparin SQ 3 times daily. Code Status: Full code. Family Communication:  None at bedside Disposition Plan: Possible discharge tomorrow 04/09/2018 Consults called: IR performed diagnostic thoracentesis.        Objective: Vitals:   04/07/18 1900 04/07/18 2121 04/08/18 0500 04/08/18 0831  BP: 108/73  Marland Kitchen)  152/77   Pulse: 81  75 87  Resp: 20  20 18   Temp: 98.1 F  (36.7 C)  97.7 F (36.5 C)   TempSrc: Oral  Oral   SpO2: 95% 95% 95% 96%  Weight:      Height:        Intake/Output Summary (Last 24 hours) at 04/08/2018 1148 Last data filed at 04/07/2018 1700 Gross per 24 hour  Intake 360 ml  Output -  Net 360 ml   Filed Weights   04/05/18 1003  Weight: 111.2 kg    Exam:  . General: 58 y.o. year-old male obese in no acute distress.  Alert and oriented x3. . Cardiovascular: Regular rate and rhythm with no rubs or gallops.  No JVD or thyromegaly noted. Marland Kitchen Respiratory: Clear to auscultation with no wheezes or rales.  Good inspiratory effort.   . Abdomen: Soft nontender nondistended with normal bowel sounds x4 quadrants. . Musculoskeletal: Trace lower extremity edema. 2/4 pulses in all 4 extremities. Marland Kitchen Psychiatry: Mood is appropriate for condition and setting   Data Reviewed: CBC: Recent Labs  Lab 04/05/18 1034 04/06/18 0601 04/07/18 0607 04/08/18 0532  WBC 9.8 20.5* 20.5* 12.5*  NEUTROABS 6.4 18.7*  --   --   HGB 17.6* 16.2 16.1 16.1  HCT 54.9* 50.7 51.4 52.3*  MCV 88.8 91.5 91.5 93.7  PLT 188 188 182 093*   Basic Metabolic Panel: Recent Labs  Lab 04/05/18 1034 04/06/18 0601 04/07/18 0607 04/08/18 0532  NA 137 136 138 139  K 3.0* 4.2 4.4 3.9  CL 101 104 102 103  CO2 25 22 27 28   GLUCOSE 140* 138* 109* 92  BUN 7 12 20 19   CREATININE 0.97 0.98 0.89 0.96  CALCIUM 8.8* 9.1 9.0 8.5*  MG 2.0  --   --   --   PHOS 2.7  --   --   --    GFR: Estimated Creatinine Clearance: 105.3 mL/min (by C-G formula based on SCr of 0.96 mg/dL). Liver Function Tests: No results for input(s): AST, ALT, ALKPHOS, BILITOT, PROT, ALBUMIN in the last 168 hours. No results for input(s): LIPASE, AMYLASE in the last 168 hours. No results for input(s): AMMONIA in the last 168 hours. Coagulation Profile: No results for input(s): INR, PROTIME in the last 168 hours. Cardiac Enzymes: No results for input(s): CKTOTAL, CKMB, CKMBINDEX, TROPONINI in the  last 168 hours. BNP (last 3 results) No results for input(s): PROBNP in the last 8760 hours. HbA1C: No results for input(s): HGBA1C in the last 72 hours. CBG: Recent Labs  Lab 04/07/18 2020  GLUCAP 135*   Lipid Profile: No results for input(s): CHOL, HDL, LDLCALC, TRIG, CHOLHDL, LDLDIRECT in the last 72 hours. Thyroid Function Tests: No results for input(s): TSH, T4TOTAL, FREET4, T3FREE, THYROIDAB in the last 72 hours. Anemia Panel: No results for input(s): VITAMINB12, FOLATE, FERRITIN, TIBC, IRON, RETICCTPCT in the last 72 hours. Urine analysis: No results found for: COLORURINE, APPEARANCEUR, LABSPEC, PHURINE, GLUCOSEU, HGBUR, BILIRUBINUR, KETONESUR, PROTEINUR, UROBILINOGEN, NITRITE, LEUKOCYTESUR Sepsis Labs: @LABRCNTIP (procalcitonin:4,lacticidven:4)  ) Recent Results (from the past 240 hour(s))  Body fluid culture (includes gram stain)     Status: None (Preliminary result)   Collection Time: 04/05/18  1:25 PM  Result Value Ref Range Status   Specimen Description   Final    PLEURAL RIGHT Performed at [facility] [address], [place], [place] [address]    Special Requests   Final    NONE Performed at [facility]  [facility], [place] [address], [place], [place] [address]    Gram Stain   Final    FEW WBC PRESENT, PREDOMINANTLY MONONUCLEAR NO ORGANISMS SEEN    Culture   Final    NO GROWTH 3 DAYS Performed at [facility] [address] [place], [place] [address]    Report Status PENDING  Incomplete      Studies: No results found.  Scheduled Meds: . aspirin EC  81 mg Oral Daily  . atorvastatin  40 mg Oral QHS  . azithromycin  500 mg Oral Daily  . busPIRone  5 mg Oral BID  . cholecalciferol  1,000 Units Oral Daily  . donepezil  5 mg Oral QHS  . escitalopram  20 mg Oral Daily  . heparin  5,000 Units Subcutaneous Q8H  . ipratropium-albuterol  3 mL Nebulization BID  . lisinopril  5 mg Oral Daily  . mouth rinse  15 mL Mouth  Rinse BID  . nicotine  21 mg Transdermal Daily  . pantoprazole  40 mg Oral Daily  . predniSONE  40 mg Oral Q breakfast  . vitamin B-12  500 mcg Oral Daily    Continuous Infusions: . cefTRIAXone (ROCEPHIN)  IV 1 g (04/08/18 0956)     LOS: 2 days     Kayleen Memos, MD [facility] Pager [phone]  If 7PM-7AM, please contact night-coverage [email or web address] Password [facility] 04/08/2018, 11:48 AM

## 2018-04-08 NOTE — Procedures (Signed)
PROCEDURE SUMMARY:  Successful image-guided right thoracentesis. Yielded 1.7 liters of hazy gold fluid. Patient tolerated procedure well. No immediate complications.  Specimen was not sent for labs. CXR ordered.  Claris Pong Arlin Savona PA-C 04/08/2018 3:02 PM

## 2018-04-09 LAB — CBC
HCT: 52.7 % — ABNORMAL HIGH (ref 39.0–52.0)
Hemoglobin: 16.6 g/dL (ref 13.0–17.0)
MCH: 29.3 pg (ref 26.0–34.0)
MCHC: 31.5 g/dL (ref 30.0–36.0)
MCV: 92.9 fL (ref 80.0–100.0)
Platelets: 148 10*3/uL — ABNORMAL LOW (ref 150–400)
RBC: 5.67 MIL/uL (ref 4.22–5.81)
RDW: 14.5 % (ref 11.5–15.5)
WBC: 11.8 10*3/uL — ABNORMAL HIGH (ref 4.0–10.5)
nRBC: 0 % (ref 0.0–0.2)

## 2018-04-09 LAB — BODY FLUID CULTURE: Culture: NO GROWTH

## 2018-04-09 LAB — BASIC METABOLIC PANEL
Anion gap: 8 (ref 5–15)
BUN: 16 mg/dL (ref 6–20)
CO2: 28 mmol/L (ref 22–32)
Calcium: 8.4 mg/dL — ABNORMAL LOW (ref 8.9–10.3)
Chloride: 101 mmol/L (ref 98–111)
Creatinine, Ser: 0.94 mg/dL (ref 0.61–1.24)
GFR calc Af Amer: >60 mL/min (ref >60)
GFR calc non Af Amer: >60 mL/min (ref >60)
Glucose, Bld: 93 mg/dL (ref 70–99)
Potassium: 3.3 mmol/L — ABNORMAL LOW (ref 3.5–5.1)
Sodium: 137 mmol/L (ref 135–145)

## 2018-04-09 LAB — GLUCOSE, CAPILLARY: Glucose-Capillary: 81 mg/dL (ref 70–99)

## 2018-04-09 MED ORDER — PREDNISONE 20 MG PO TABS: 40.0000 mg | ORAL_TABLET | Freq: Every day | ORAL | 0 refills | Status: AC

## 2018-04-09 MED ORDER — AZITHROMYCIN 250 MG PO TABS: ORAL_TABLET | ORAL | 0 refills | Status: DC

## 2018-04-09 MED ORDER — ALBUTEROL SULFATE (2.5 MG/3ML) 0.083% IN NEBU: 2.5000 mg | INHALATION_SOLUTION | Freq: Two times a day (BID) | RESPIRATORY_TRACT | 0 refills | Status: AC | PRN

## 2018-04-09 MED ORDER — NICOTINE 21 MG/24HR TD PT24: 21.0000 mg | MEDICATED_PATCH | Freq: Every day | TRANSDERMAL | 0 refills | Status: DC

## 2018-04-09 MED ORDER — SALINE SPRAY 0.65 % NA SOLN
1.0000 | NASAL | Status: DC | PRN
  Administered 2018-04-09: 1 via NASAL
  Filled 2018-04-09: qty 44

## 2018-04-09 MED ORDER — POTASSIUM CHLORIDE CRYS ER 20 MEQ PO TBCR
40.0000 meq | EXTENDED_RELEASE_TABLET | Freq: Once | ORAL | Status: AC
  Administered 2018-04-09: 40 meq via ORAL
  Filled 2018-04-09: qty 2

## 2018-04-09 MED ORDER — PANTOPRAZOLE SODIUM 40 MG PO TBEC: 40.0000 mg | DELAYED_RELEASE_TABLET | Freq: Every day | ORAL | 0 refills | Status: DC

## 2018-04-09 NOTE — Discharge Instructions (Signed)
Thoracentesis, Care After This sheet gives you information about how to care for yourself after your procedure. Your health care provider may also give you more specific instructions. If you have problems or questions, contact your health care provider. What can I expect after the procedure? After your procedure, it is common to have some pain at the site where the needle was inserted (puncture site). Follow these instructions at home:  Care of the puncture site  Follow instructions from your health care provider about how to take care of your puncture site. Make sure you: ? Wash your hands with soap and water before you change your bandage (dressing). If soap and water are not available, use hand sanitizer. ? Change your dressing as told by your health care provider.  Check the puncture site every day for signs of infection. Check for: ? Redness, swelling, or pain. ? Fluid or blood. ? Warmth. ? Pus or a bad smell.  Do not take baths, swim, or use a hot tub until your health care provider approves. General instructions  Take over-the-counter and prescription medicines only as told by your health care provider.  Do not drive for 24 hours if you were given a medicine to help you relax (sedative) during your procedure.  Drink enough fluid to keep your urine pale yellow.  You may return to your normal diet and normal activities as told by your health care provider.  Keep all follow-up visits as told by your health care provider. This is important. Contact a health care provider if you:  Have redness, swelling, or pain at your puncture site.  Have fluid or blood coming from your puncture site.  Notice that your puncture site feels warm to the touch.  Have pus or a bad smell coming from your puncture site.  Have a fever.  Have chills.  Have nausea or vomiting.  Have trouble breathing.  Develop a worsening cough. Get help right away if you:  Have extreme shortness of  breath.  Develop chest pain.  Faint or feel light-headed. Summary  After your procedure, it is common to have some pain at the site where the needle was inserted (puncture site).  Wash your hands with soap and water before you change your bandage (dressing).  Check your puncture site every day for signs of infection.  Take over-the-counter and prescription medicines only as told by your health care provider. This information is not intended to replace advice given to you by your health care provider. Make sure you discuss any questions you have with your health care provider. Document Released: 03/21/2014 Document Revised: 01/23/2017 Document Reviewed: 01/23/2017 Elsevier Interactive Patient Education  2019 [employer]   Pleural Effusion Pleural effusion is an abnormal buildup of fluid in the layers of tissue between the lungs and the inside of the chest (pleural space) The two layers of tissue that line the lungs and the inside of the chest are called pleura. Usually, there is no air in the space between the pleura, only a thin layer of fluid. Some conditions can cause a large amount of fluid to build up, which can cause the lung to collapse if untreated. A pleural effusion is usually caused by another disease that requires treatment. What are the causes? Pleural effusion can be caused by:  Heart failure.  Certain infections, such as pneumonia or tuberculosis.  Cancer.  A blood clot in the lung (pulmonary embolism).  Complications from surgery, such as from open heart surgery.  Liver disease (  cirrhosis).  Kidney disease. What are the signs or symptoms? In some cases, pleural effusion may cause no symptoms. If symptoms are present, they may include:  Shortness of breath, especially when lying down.  Chest pain. This may get worse when taking a deep breath.  Fever.  Dry, long-lasting (chronic) cough.  Hiccups.  Rapid breathing. An underlying condition that is  causing the pleural effusion (such as heart failure, pneumonia, blood clots, tuberculosis, or cancer) may also cause other symptoms. How is this diagnosed? This condition may be diagnosed based on:  Your symptoms and medical history.  A physical exam.  A chest X-ray.  A procedure to use a needle to remove fluid from the pleural space (thoracentesis). This fluid is tested.  Other imaging studies of the chest, such as ultrasound or CT scan. How is this treated? Depending on the cause of your condition, treatment may include:  Treating the underlying condition that is causing the effusion. When that condition improves, the effusion will also improve. Examples of treatment for underlying conditions include: ? Antibiotic medicines to treat an infection. ? Diuretics or other heart medicines to treat heart failure.  Thoracentesis.  Placing a thin flexible tube under your skin and into your chest to continuously drain the effusion (indwelling pleural catheter).  Surgery to remove the outer layer of tissue from the pleural space (decortication).  A procedure to put medicine into the chest cavity to seal the pleural space and prevent fluid buildup (pleurodesis).  Chemotherapy and radiation therapy, if you have cancerous (malignant) pleural effusion. These treatments are typically used to treat cancer. They kill certain cells in the body. Follow these instructions at home:  Take over-the-counter and prescription medicines only as told by your health care provider.  Ask your health care provider what activities are safe for you.  Keep track of how long you are able to do mild exercise (such as walking) before you get short of breath. Write down this information to share with your health care provider. Your ability to exercise should improve over time.  Do not use any products that contain nicotine or tobacco, such as cigarettes and e-cigarettes. If you need help quitting, ask your health  care provider.  Keep all follow-up visits as told by your health care provider. This is important. Contact a health care provider if:  The amount of time that you are able to do mild exercise: ? Decreases. ? Does not improve with time.  You have a fever. Get help right away if:  You are short of breath.  You develop chest pain.  You develop a new cough. Summary  Pleural effusion is an abnormal buildup of fluid in the layers of tissue between the lungs and the inside of the chest.  Pleural effusion can have many causes, including heart failure, pulmonary embolism, infections, or cancer.  Symptoms of pleural effusion can include shortness of breath, chest pain, fever, long-lasting (chronic) cough, hiccups, or rapid breathing.  Diagnosis often involves making images of the chest (such as with ultrasound or X-ray) and removing fluid (thoracentesis) to send for testing.  Treatment for pleural effusion depends on what underlying condition is causing it. This information is not intended to replace advice given to you by your health care provider. Make sure you discuss any questions you have with your health care provider. Document Released: 02/28/2005 Document Revised: 11/03/2016 Document Reviewed: 11/03/2016 Elsevier Interactive Patient Education  2019 [employer].

## 2018-04-09 NOTE — Progress Notes (Signed)
PT Cancellation Note  Patient Details Name: Daniel Bryant MRN: [id number] DOB: 1961/01/17   Cancelled Treatment:    Reason Eval/Treat Not Completed: PT screened, no needs identified, will sign off - Per RN and pt, pt independent with mobility. Pt states he is at baseline level of mobility, and states he is mobilizing multiple times a day without assist. PT signing off, please reconsult if needed.   Julien Girt, PT [facility] Pager [phone]  Office [phone]   Roxine Caddy D Elonda Husky 04/09/2018, 12:17 PM

## 2018-04-09 NOTE — Discharge Summary (Addendum)
Discharge Summary  Daniel Bryant EHU:[id number] DOB: Apr 11, 1960  PCP: Patient, No Pcp Per  Admit date: 04/05/2018 Discharge date: 04/09/2018  Time spent: 35 minutes  Recommendations for Outpatient Follow-up:  1. Follow up with your cardiologist at the [facility] 2. Follow up with your PCP 3. Take your medications as prescribed 4. Abstain from tobacco use or nicotine use from dipping  Discharge Diagnoses:  Active Hospital Problems   Diagnosis Date Noted  . Pleural effusion on right 04/05/2018  . Hypokalemia 04/05/2018  . High cholesterol 04/05/2018  . Hypertension 04/05/2018  . COPD with acute exacerbation ([place]) 04/05/2018  . Polycythemia 04/05/2018  . CAP (community acquired pneumonia) 04/05/2018    Resolved Hospital Problems  No resolved problems to display.    Discharge Condition: Stable   Diet recommendation: Heart healthy   Vitals:   04/09/18 1007 04/09/18 1046  BP: (!) 141/93   Pulse: 78   Resp: 16   Temp: 97.6 F (36.4 C)   SpO2: 95% 97%    History of present illness:  Daniel Repinski Smithis a 58 y.o.malewith medical history significant ofasthma/COPD, GERD, history of petit mal seizures in remission, hyperlipidemia, hypertension, memory impairment, OSA not on CPAP, history of pacemaker placement due to sick sinus syndrome, PTSD who is coming to the emergency department due to progressively worsening dyspnea associated with productive cough for yellowish sputum, wheezing, fatigue and malaise for the past 24 hours. He was given albuterol 5 mg neb x2, Atrovent 0.5 mg x 1, Solu-Medrol 125 mg IVP by EMS. He is unable to provide further history after he was sedated with lorazepam 1 mg IVP prior to undergoing thoracentesis.  Hospital course complicated by hypoxia in the setting of large right pleural effusion post R thoracentesis. He had a 2D echo done on 04/07/18 which was unrevealing. Had a repeated R thoracentesis on 04/08/18 with total 3.7L fluid removed (x2  thoracentesis)from R pleura.  04/09/18: Seen and examined at his bedside. No acute events overnight. No new complaints. On the day of discharge, the patient was hemodynamically stable. He will need to follow up with his cardiologist at the [facility] hospital. He will also need to follow uo with his PCP post hospitalization.  Hospital Course:  Principal Problem:   Pleural effusion on right Active Problems:   Hypokalemia   High cholesterol   Hypertension   COPD with acute exacerbation (HCC)   Polycythemia   CAP (community acquired pneumonia)  Resolving large R pleural effusion suspect secondary to CAP (community acquired pneumonia) post thoracentesis x 2 2 L clear fluid removed from thoracentesis on 04/05/18 2D echo unrevealing normal LVEF: 2D echo done on 04/07/2018 revealed normal LVEF 60 to 65% Procalcitonin negative Body fluid culture no growth to date Gram stain no organisms seen On antibiotics empirically for community-acquired pneumonia Leukocytosis resolving WBC 11.8K from 12 K from 20 K Repeat R thoracentesis done on 04/08/18 with 1.7L fluid removed C/w po azithromycin x 5 days Completed 4 days of rocephin Follow up with your PCP post hospitalization  Resolved Acute hypoxic respiratory failure secondary to large right pleural effusion versus community-acquired pneumonia versus others Initially required 3 L of oxygen by nasal cannula to maintain O2 sat >90% Passed his home O2 evaluation on 04/09/18:  SATURATION QUALIFICATIONS: (This note is used to comply with regulatory documentation for home oxygen)  Patient Saturations on Room Air at Rest = 97%  Patient Saturations on [employer] while Ambulating = 94%  Virginia Rochester, RN  Active Problems:  COPD with acute exacerbation ([place]) C/w nebs as needed BID for wheezing or shortness of breath DuoNeb every 6 hours. Prednisone 40 mg x 5 days Azithromycin x 5 days Abstain from nicotine use  Hypokalemia, repleted K+ 3.3, repleted  with oral KCL 40 meq x2 Follow up with your PCP    High cholesterol Continue atorvastatin 40 mg p.o. daily.  Hypertension Continue lisinopril 5 mg p.o. daily. Monitor BP, electrolytes and renal function.  Polycythemia Secondary to COPD and nicotine use. Continue aspirin 81 mg p.o. daily.   Code Status:Full code.  Consults called:IR performeddiagnostic thoracentesis.     Discharge Exam: BP (!) 141/93 (BP Location: Left Arm)   Pulse 78   Temp 97.6 F (36.4 C) (Oral)   Resp 16   Ht 6' (1.829 m)   Wt 111.2 kg   SpO2 97%   BMI 33.24 kg/m  . General: 58 y.o. year-old male well developed well nourished in no acute distress.  Alert and oriented x3. . Cardiovascular: Regular rate and rhythm with no rubs or gallops.  No thyromegaly or JVD noted.   Marland Kitchen Respiratory: Clear to auscultation with no wheezes or rales. Good inspiratory effort. . Abdomen: Soft nontender nondistended with normal bowel sounds x4 quadrants. . Musculoskeletal: No lower extremity edema. 2/4 pulses in all 4 extremities. . Skin: No ulcerative lesions noted or rashes, . Psychiatry: Mood is appropriate for condition and setting  Discharge Instructions You were cared for by a hospitalist during your hospital stay. If you have any questions about your discharge medications or the care you received while you were in the hospital after you are discharged, you can call the unit and asked to speak with the hospitalist on call if the hospitalist that took care of you is not available. Once you are discharged, your primary care physician will handle any further medical issues. Please note that NO REFILLS for any discharge medications will be authorized once you are discharged, as it is imperative that you return to your primary care physician (or establish a relationship with a primary care physician if you do not have one) for your aftercare needs so that they can reassess your need for medications and monitor  your lab values.  Discharge Instructions    DME Nebulizer machine   Complete by:  As directed    Patient needs a nebulizer to treat with the following condition:  CAP (community acquired pneumonia)     Allergies as of 04/09/2018   Not on File     Medication List    TAKE these medications   albuterol (2.5 MG/3ML) 0.083% nebulizer solution Commonly known as:  PROVENTIL Take 3 mLs (2.5 mg total) by nebulization 2 (two) times daily as needed for wheezing or shortness of breath.   aspirin EC 81 MG tablet Take 81 mg by mouth daily.   atorvastatin 40 MG tablet Commonly known as:  LIPITOR Take 40 mg by mouth at bedtime.   azithromycin 250 MG tablet Commonly known as:  ZITHROMAX Take 1 tablet 500 mg daily x 5 days. Start taking on:  April 10, 2018   busPIRone 10 MG tablet Commonly known as:  BUSPAR Take 5 mg by mouth 2 (two) times daily.   donepezil 5 MG tablet Commonly known as:  ARICEPT Take 5 mg by mouth at bedtime.   escitalopram 20 MG tablet Commonly known as:  LEXAPRO Take 20 mg by mouth daily.   lisinopril 5 MG tablet Commonly known as:  PRINIVIL,ZESTRIL Take 5 mg  by mouth daily.   nicotine 21 mg/24hr patch Commonly known as:  NICODERM CQ - dosed in mg/24 hours Place 1 patch (21 mg total) onto the skin daily. Start taking on:  April 10, 2018   pantoprazole 40 MG tablet Commonly known as:  PROTONIX Take 1 tablet (40 mg total) by mouth daily. Start taking on:  April 10, 2018 What changed:  additional instructions   predniSONE 20 MG tablet Commonly known as:  DELTASONE Take 2 tablets (40 mg total) by mouth daily with breakfast for 5 days. Start taking on:  April 10, 2018   vitamin B-12 500 MCG tablet Commonly known as:  CYANOCOBALAMIN Take 500 mcg by mouth daily.   Vitamin D3 25 MCG (1000 UT) Caps Take 1,000 Units by mouth daily.            Durable Medical Equipment  (From admission, onward)         Start     Ordered   04/09/18 0000   DME Nebulizer machine    Question:  Patient needs a nebulizer to treat with the following condition  Answer:  CAP (community acquired pneumonia)   04/09/18 1356         Not on File Follow-up Information    Gerome Sam, MD. Call in 1 day(s).   Specialty:  Internal Medicine Why:  Please call for post hospital follow-up appointment. Contact information: [place] [address] [phone]            The results of significant diagnostics from this hospitalization (including imaging, microbiology, ancillary and laboratory) are listed below for reference.    Significant Diagnostic Studies: Dg Chest 1 View  Result Date: 04/08/2018 CLINICAL DATA:  Status post right thoracentesis. EXAM: CHEST  1 VIEW COMPARISON:  Film earlier today at 1135 hours FINDINGS: No visualized pneumothorax with significantly less right pleural fluid volume after thoracentesis. Stable heart size and appearance of pacemaker. No pulmonary edema or airspace consolidation. IMPRESSION: Significant decrease in right pleural fluid volume after thoracentesis. No pneumothorax identified. Electronically Signed   By: Aletta Edouard M.D.   On: 04/08/2018 15:12   Dg Chest 1 View  Result Date: 04/05/2018 CLINICAL DATA:  Post thoracentesis, history of COPD EXAM: CHEST  1 VIEW COMPARISON:  None. FINDINGS: RIGHT-sided pacemaker overlies stable enlarged cardiac silhouette. Large RIGHT effusion is decreased slightly in volume compared to prior. No pneumothorax. IMPRESSION: Decrease in RIGHT effusion.  No pneumothorax. Persistent large RIGHT effusion remains. Electronically Signed   By: Suzy Bouchard M.D.   On: 04/05/2018 13:53   Dg Chest Port 1 View  Result Date: 04/08/2018 CLINICAL DATA:  Hypoxia EXAM: PORTABLE CHEST 1 VIEW COMPARISON:  Three days ago FINDINGS: Moderate right pleural effusion, less than seen previously. The left chest remains clear. Normal heart size with dual-chamber pacer leads  that are partially visualized. IMPRESSION: Moderate right pleural effusion that has decreased from study 3 days ago. Electronically Signed   By: Monte Fantasia M.D.   On: 04/08/2018 13:46   Dg Chest Portable 1 View  Result Date: 04/05/2018 CLINICAL DATA:  Shortness of breath for the past 24 hours. Missed asthma medications yesterday. EXAM: PORTABLE CHEST 1 VIEW COMPARISON:  None. FINDINGS: Large right pleural effusion mild adjacent right lung atelectasis. Clear left lung. Minimal diffuse peribronchial thickening and accentuation of the interstitial markings. The right heart borders are obscured by the pleural fluid with no gross cardiac enlargement. Right subclavian pacemaker leads in satisfactory position. Unremarkable bones.  IMPRESSION: 1. Large right pleural effusion with mild adjacent right lung atelectasis. 2. Minimal chronic bronchitic changes. Electronically Signed   By: Claudie Revering M.D.   On: 04/05/2018 10:35   US Thoracentesis Asp Pleural Space W/img Guide  Result Date: 04/09/2018 INDICATION: Patient with history of CAP, COPD, asthma, dyspnea, and recurrent right pleural effusion. Request is made for therapeutic right thoracentesis. EXAM: ULTRASOUND GUIDED THERAPEUTIC RIGHT THORACENTESIS MEDICATIONS: 10 mL of 1% lidocaine COMPLICATIONS: None immediate. PROCEDURE: An ultrasound guided thoracentesis was thoroughly discussed with the patient and questions answered. The benefits, risks, alternatives and complications were also discussed. The patient understands and wishes to proceed with the procedure. Written consent was obtained. Ultrasound was performed to localize and mark an adequate pocket of fluid in the right chest. The area was then prepped and draped in the normal sterile fashion. 1% Lidocaine was used for local anesthesia. Under ultrasound guidance a 6 Fr Safe-T-Centesis catheter was introduced. Thoracentesis was performed. The catheter was removed and a dressing applied. FINDINGS: A total  of approximately 1.7 L of hazy gold fluid was removed. IMPRESSION: Successful ultrasound guided right thoracentesis yielding 1.7 L of pleural fluid. Read by: Earley Abide, PA-C Electronically Signed   By: Aletta Edouard M.D.   On: 04/08/2018 15:22   US Thoracentesis Asp Pleural Space W/img Guide  Result Date: 04/05/2018 INDICATION: Patient with history of COPD, asthma, dyspnea, and right pleural effusion. Request is made for diagnostic and therapeutic right thoracentesis. EXAM: ULTRASOUND GUIDED DIAGNOSTIC AND THERAPEUTIC RIGHT THORACENTESIS MEDICATIONS: 10 mL 1% lidocaine COMPLICATIONS: None immediate. PROCEDURE: An ultrasound guided thoracentesis was thoroughly discussed with the patient and questions answered. The benefits, risks, alternatives and complications were also discussed. The patient understands and wishes to proceed with the procedure. Written consent was obtained. Ultrasound was performed to localize and mark an adequate pocket of fluid in the right chest. The area was then prepped and draped in the normal sterile fashion. 1% Lidocaine was used for local anesthesia. Under ultrasound guidance a 6 Fr Safe-T-Centesis catheter was introduced. Thoracentesis was performed. The catheter was removed and a dressing applied. FINDINGS: A total of approximately 2 L of clear gold fluid was removed. Samples were sent to the laboratory as requested by the clinical team. IMPRESSION: Successful ultrasound guided right thoracentesis yielding 2 L of pleural fluid. Read by: Earley Abide, PA-C Electronically Signed   By: Jerilynn Mages.  Shick M.D.   On: 04/05/2018 14:05    Microbiology: Recent Results (from the past 240 hour(s))  Body fluid culture (includes gram stain)     Status: None   Collection Time: 04/05/18  1:25 PM  Result Value Ref Range Status   Specimen Description   Final    PLEURAL RIGHT Performed at [facility], [place] [address], [place], [place] [address]    Special Requests    Final    NONE Performed at [facility], [place] [address], [place], [place] [address]    Gram Stain   Final    FEW WBC PRESENT, PREDOMINANTLY MONONUCLEAR NO ORGANISMS SEEN    Culture   Final    NO GROWTH 3 DAYS Performed at [place] [address], [place], [place] [address]    Report Status 04/09/2018 FINAL  Final     Labs: Basic Metabolic Panel: Recent Labs  Lab 04/05/18 1034 04/06/18 0601 04/07/18 0607 04/08/18 0532 04/09/18 0540  NA 137 136 138 139 137  K 3.0* 4.2 4.4 3.9 3.3*  CL 101  104 102 103 101  CO2 25 22 27 28 28   GLUCOSE 140* 138* 109* 92 93  BUN 7 12 20 19 16   CREATININE 0.97 0.98 0.89 0.96 0.94  CALCIUM 8.8* 9.1 9.0 8.5* 8.4*  MG 2.0  --   --   --   --   PHOS 2.7  --   --   --   --    Liver Function Tests: No results for input(s): AST, ALT, ALKPHOS, BILITOT, PROT, ALBUMIN in the last 168 hours. No results for input(s): LIPASE, AMYLASE in the last 168 hours. No results for input(s): AMMONIA in the last 168 hours. CBC: Recent Labs  Lab 04/05/18 1034 04/06/18 0601 04/07/18 0607 04/08/18 0532 04/09/18 0540  WBC 9.8 20.5* 20.5* 12.5* 11.8*  NEUTROABS 6.4 18.7*  --   --   --   HGB 17.6* 16.2 16.1 16.1 16.6  HCT 54.9* 50.7 51.4 52.3* 52.7*  MCV 88.8 91.5 91.5 93.7 92.9  PLT 188 188 182 142* 148*   Cardiac Enzymes: No results for input(s): CKTOTAL, CKMB, CKMBINDEX, TROPONINI in the last 168 hours. BNP: BNP (last 3 results) Recent Labs    04/06/18 0601  BNP 74.1    ProBNP (last 3 results) No results for input(s): PROBNP in the last 8760 hours.  CBG: Recent Labs  Lab 04/07/18 2020 04/09/18 0734  GLUCAP 135* 81       Signed:  Kayleen Memos, MD [facility] 04/09/2018, 1:59 PM

## 2018-04-09 NOTE — Progress Notes (Signed)
SATURATION QUALIFICATIONS: (This note is used to comply with regulatory documentation for home oxygen)  Patient Saturations on Room Air at Rest = 97%  Patient Saturations on Room Air while Ambulating = 94%  Virginia Rochester, RN

## 2018-04-12 ENCOUNTER — Other Ambulatory Visit: Payer: Self-pay | Admitting: *Deleted

## 2018-04-12 ENCOUNTER — Encounter: Payer: Self-pay | Admitting: *Deleted

## 2018-04-12 NOTE — Progress Notes (Signed)
Oncology Nurse Navigator Documentation  Oncology Nurse Navigator Flowsheets 04/12/2018  Navigator Location [facility]  Navigator Encounter Type Other/I received an update from Dr. Nevada Crane.  Patient is being seen tomorrow at [place].    Barriers/Navigation Needs Coordination of Care  Interventions Coordination of Care  Coordination of Care Other  Acuity Level 1  Time Spent with Patient 15

## 2018-04-12 NOTE — Progress Notes (Signed)
The proposed treatment discussed in cancer conference 04/12/2018 is for discussion purpose only and is not a binding recommendation.  Patient was not physically examined nor present for their treatment options. Therefore, final treatment plans cannot be decided.

## 2018-04-12 NOTE — Progress Notes (Signed)
Oncology Nurse Navigator Documentation  Oncology Nurse Navigator Flowsheets 04/12/2018  Navigator Location [facility]  Navigator Encounter Type Other/Patient's case was presented at cancer conference today.  He has stage IV lung cancer and was unsure if he was being treated for this.  I contacted doctor who saw in hospital for an update.   Abnormal Finding Date 04/05/2018  Confirmed Diagnosis Date 04/08/2018  Acuity Level 2  Time Spent with Patient 15

## 2018-04-20 ENCOUNTER — Observation Stay (HOSPITAL_COMMUNITY)
Admission: EM | Admit: 2018-04-20 | Discharge: 2018-04-21 | Disposition: A | Discharge status: Home / Self Care | Payer: No Typology Code available for payment source | Attending: Emergency Medicine | Admitting: Emergency Medicine

## 2018-04-20 ENCOUNTER — Encounter (HOSPITAL_COMMUNITY): Payer: Self-pay

## 2018-04-20 ENCOUNTER — Other Ambulatory Visit: Payer: Self-pay

## 2018-04-20 ENCOUNTER — Emergency Department (HOSPITAL_COMMUNITY): Payer: No Typology Code available for payment source

## 2018-04-20 DIAGNOSIS — E785 Hyperlipidemia, unspecified: Secondary | ICD-10-CM | POA: Diagnosis not present

## 2018-04-20 DIAGNOSIS — Z85118 Personal history of other malignant neoplasm of bronchus and lung: Secondary | ICD-10-CM | POA: Diagnosis not present

## 2018-04-20 DIAGNOSIS — Z950 Presence of cardiac pacemaker: Secondary | ICD-10-CM | POA: Diagnosis not present

## 2018-04-20 DIAGNOSIS — I10 Essential (primary) hypertension: Secondary | ICD-10-CM | POA: Diagnosis not present

## 2018-04-20 DIAGNOSIS — K219 Gastro-esophageal reflux disease without esophagitis: Secondary | ICD-10-CM | POA: Diagnosis not present

## 2018-04-20 DIAGNOSIS — E876 Hypokalemia: Secondary | ICD-10-CM | POA: Insufficient documentation

## 2018-04-20 DIAGNOSIS — J441 Chronic obstructive pulmonary disease with (acute) exacerbation: Secondary | ICD-10-CM | POA: Diagnosis not present

## 2018-04-20 DIAGNOSIS — J90 Pleural effusion, not elsewhere classified: Secondary | ICD-10-CM

## 2018-04-20 DIAGNOSIS — R0602 Shortness of breath: Secondary | ICD-10-CM

## 2018-04-20 DIAGNOSIS — F4310 Post-traumatic stress disorder, unspecified: Secondary | ICD-10-CM | POA: Diagnosis not present

## 2018-04-20 DIAGNOSIS — Z79899 Other long term (current) drug therapy: Secondary | ICD-10-CM | POA: Diagnosis not present

## 2018-04-20 DIAGNOSIS — E7800 Pure hypercholesterolemia, unspecified: Secondary | ICD-10-CM | POA: Insufficient documentation

## 2018-04-20 DIAGNOSIS — Z87891 Personal history of nicotine dependence: Secondary | ICD-10-CM | POA: Insufficient documentation

## 2018-04-20 DIAGNOSIS — J910 Malignant pleural effusion: Secondary | ICD-10-CM | POA: Diagnosis not present

## 2018-04-20 DIAGNOSIS — I495 Sick sinus syndrome: Secondary | ICD-10-CM | POA: Diagnosis not present

## 2018-04-20 DIAGNOSIS — G4733 Obstructive sleep apnea (adult) (pediatric): Secondary | ICD-10-CM | POA: Insufficient documentation

## 2018-04-20 DIAGNOSIS — J449 Chronic obstructive pulmonary disease, unspecified: Secondary | ICD-10-CM

## 2018-04-20 DIAGNOSIS — Z9889 Other specified postprocedural states: Secondary | ICD-10-CM

## 2018-04-20 HISTORY — DX: Malignant (primary) neoplasm, unspecified: C80.1

## 2018-04-20 LAB — CBC WITH DIFFERENTIAL/PLATELET
Abs Immature Granulocytes: 0.04 10*3/uL (ref 0.00–0.07)
BASOS PCT: 1 %
Basophils Absolute: 0.1 10*3/uL (ref 0.0–0.1)
Eosinophils Absolute: 0.5 10*3/uL (ref 0.0–0.5)
Eosinophils Relative: 5 %
HCT: 54.3 % — ABNORMAL HIGH (ref 39.0–52.0)
Hemoglobin: 17.3 g/dL — ABNORMAL HIGH (ref 13.0–17.0)
Immature Granulocytes: 0 %
Lymphocytes Relative: 19 %
Lymphs Abs: 1.8 10*3/uL (ref 0.7–4.0)
MCH: 28.5 pg (ref 26.0–34.0)
MCHC: 31.9 g/dL (ref 30.0–36.0)
MCV: 89.5 fL (ref 80.0–100.0)
Monocytes Absolute: 0.5 10*3/uL (ref 0.1–1.0)
Monocytes Relative: 5 %
Neutro Abs: 6.6 10*3/uL (ref 1.7–7.7)
Neutrophils Relative %: 70 %
Platelets: 181 10*3/uL (ref 150–400)
RBC: 6.07 MIL/uL — ABNORMAL HIGH (ref 4.22–5.81)
RDW: 14.1 % (ref 11.5–15.5)
WBC: 9.4 10*3/uL (ref 4.0–10.5)
nRBC: 0 % (ref 0.0–0.2)

## 2018-04-20 LAB — TROPONIN I: Troponin I: <0.03 ng/mL (ref <0.03)

## 2018-04-20 LAB — COMPREHENSIVE METABOLIC PANEL
ALT: 35 U/L (ref 0–44)
AST: 20 U/L (ref 15–41)
Albumin: 4.1 g/dL (ref 3.5–5.0)
Alkaline Phosphatase: 75 U/L (ref 38–126)
Anion gap: 10 (ref 5–15)
BUN: 9 mg/dL (ref 6–20)
CO2: 24 mmol/L (ref 22–32)
Calcium: 8.3 mg/dL — ABNORMAL LOW (ref 8.9–10.3)
Chloride: 107 mmol/L (ref 98–111)
Creatinine, Ser: 0.98 mg/dL (ref 0.61–1.24)
GFR calc Af Amer: >60 mL/min (ref >60)
GFR calc non Af Amer: >60 mL/min (ref >60)
GLUCOSE: 91 mg/dL (ref 70–99)
Potassium: 3.4 mmol/L — ABNORMAL LOW (ref 3.5–5.1)
Sodium: 141 mmol/L (ref 135–145)
Total Bilirubin: 0.5 mg/dL (ref 0.3–1.2)
Total Protein: 6.8 g/dL (ref 6.5–8.1)

## 2018-04-20 MED ORDER — ALBUTEROL SULFATE (2.5 MG/3ML) 0.083% IN NEBU: 5.0000 mg | INHALATION_SOLUTION | Freq: Once | RESPIRATORY_TRACT | Status: DC

## 2018-04-20 MED ORDER — ALBUTEROL SULFATE (2.5 MG/3ML) 0.083% IN NEBU: 2.5000 mg | INHALATION_SOLUTION | Freq: Two times a day (BID) | RESPIRATORY_TRACT | Status: DC | PRN

## 2018-04-20 MED ORDER — PANTOPRAZOLE SODIUM 40 MG PO TBEC
40.0000 mg | DELAYED_RELEASE_TABLET | Freq: Every day | ORAL | Status: DC
  Administered 2018-04-20 – 2018-04-21 (×2): 40 mg via ORAL
  Filled 2018-04-20 (×2): qty 1

## 2018-04-20 MED ORDER — ESCITALOPRAM OXALATE 20 MG PO TABS
20.0000 mg | ORAL_TABLET | Freq: Every day | ORAL | Status: DC
  Administered 2018-04-20 – 2018-04-21 (×2): 20 mg via ORAL
  Filled 2018-04-20 (×2): qty 1

## 2018-04-20 MED ORDER — ATORVASTATIN CALCIUM 40 MG PO TABS
40.0000 mg | ORAL_TABLET | Freq: Every day | ORAL | Status: DC
  Administered 2018-04-20: 40 mg via ORAL
  Filled 2018-04-20: qty 1

## 2018-04-20 MED ORDER — DONEPEZIL HCL 10 MG PO TABS
5.0000 mg | ORAL_TABLET | Freq: Every day | ORAL | Status: DC
  Administered 2018-04-20: 5 mg via ORAL
  Filled 2018-04-20: qty 1

## 2018-04-20 MED ORDER — IPRATROPIUM-ALBUTEROL 0.5-2.5 (3) MG/3ML IN SOLN
3.0000 mL | Freq: Once | RESPIRATORY_TRACT | Status: AC
  Administered 2018-04-20: 3 mL via RESPIRATORY_TRACT
  Filled 2018-04-20: qty 3

## 2018-04-20 MED ORDER — BUSPIRONE HCL 5 MG PO TABS
5.0000 mg | ORAL_TABLET | Freq: Two times a day (BID) | ORAL | Status: DC
  Administered 2018-04-20 – 2018-04-21 (×2): 5 mg via ORAL
  Filled 2018-04-20 (×2): qty 1

## 2018-04-20 MED ORDER — LISINOPRIL 5 MG PO TABS
5.0000 mg | ORAL_TABLET | Freq: Every day | ORAL | Status: DC
  Administered 2018-04-20 – 2018-04-21 (×2): 5 mg via ORAL
  Filled 2018-04-20 (×2): qty 1

## 2018-04-20 NOTE — ED Notes (Signed)
Report called to receiving RN.

## 2018-04-20 NOTE — ED Triage Notes (Signed)
Patient c/o shob that started again last night Patient used breathing treatment last night and this morning with some relief.  Patient diagnosed with lung [place] and found out yesterday.  Discharged on 26th of last month here from [place]. Pulled 2.5 litters off lungs.   Patient went to [place] and was advised to come back to [facility] for follow up on his breathing and BP.   Patient needs to bring back Imaging of CT on disk for oncology visit on 05/03/18.   A/O Ambulatory in triage.

## 2018-04-20 NOTE — H&P (Addendum)
History and Physical  Attending MD note  Patient was seen, examined,treatment plan was discussed with the PA-S.  I have personally reviewed the clinical findings, lab, imaging studies and management of this patient in detail. I agree with the documentation, as recorded by the PA-S  Patient is 58 year old male with history of hypertension, hyperlipidemia, COPD, sick sinus syndrome with pacemaker placement (prior to that he has had several syncopal episodes and felt to have a degree of anoxia), memory problems who was recently admitted to this hospital in January 2020 with pneumonia and also thought to have a parapneumonic effusion.  He was treated with antibiotics and underwent thoracentesis x2, and cytology eventually revealed metastatic adenocarcinoma.  [facility] and [facility] get his treatment [facility].  He became more short of breath over the last couple of days, feels like he cannot catch his breath while walking, and came back to the ER.  In the ER he is tachypneic but otherwise afebrile and blood pressure stable.  His blood work does not show leukocytosis and is unremarkable.  Chest x-ray showed large right-sided pleural effusion.  We are asked to admit  Constitutional: NAD, calm, comfortable Vitals:   04/20/18 1200 04/20/18 1230 04/20/18 1330 04/20/18 1400  BP:   (!) 130/98 (!) 147/102  Pulse:   90 (!) 103  Resp: (!) 21 19 (!) 23 19  Temp:      TempSrc:      SpO2:   99% 96%  Weight:      Height:       Eyes: PERRL, lids and conjunctivae normal ENMT: Mucous membranes are moist. Posterior pharynx clear of any exudate or lesions.Normal dentition.  Neck: normal, supple, no masses, no thyromegaly Respiratory: Creased breath sounds on the right, increased respiratory effort, tachypneic Cardiovascular: Regular rate and rhythm, no murmurs / rubs / gallops. No extremity edema. 2+ pedal pulses.  Abdomen: no tenderness, no masses palpated.  Bowel sounds positive.  Musculoskeletal: no clubbing / cyanosis. Normal muscle tone.  Skin: no rashes, lesions, ulcers. No induration Neurologic: CN 2-12 grossly intact. Strength 5/5 in all 4.  Psychiatric: Normal judgment and insight. Alert and oriented x 3. Normal mood.    Plan  Recurrent malignant right-sided pleural effusion -We will obtain ultrasound-guided thoracentesis.  Send basic labs to ensure not complicated -Patient wants to establish care and follow-up with oncology at the [facility], will hold off pursuing further staging work-up at this point.  Have asked patient to call and try to move the appointment as soon as possible preferably prior his effusion reaccumulate  History of COPD and asthma -No wheezing, this is stable, continue home medications  Hypertension -Continue home medications  Memory impairment, PTSD -Continue home medications  Sick sinus syndrome status post pacemaker -Outpatient follow-up  Rest as below  Paizleigh Wilds M. Cruzita Lederer, MD, PhD [facility]  Contact via  [email or web address]  [facility] Office Info P: [phone]  F: [phone]  Miking Usrey QMV:[id number] DOB: Dec 12, 1960 DOA: 04/20/2018  PCP: Followed at the [facility] Patient coming from: Home  Chief Complaint: SOB  HPI: Clete Kuch is a 58 y.o. male with medical history significant of COPD, HTN, HLD, GERD, Sick sinus syndrome s/p pacemaker placement, memory impairment and PTSD, who presents to the ED with complaints of increased SOB over the past couple of days. He was recently admitted in January with similar complaints and was found to have PNA and right-sided pleural effusion at that time.  He was treated with abx and had a thoracentesis with 3.7 L of fluid removed from his right pleura. His cytology results from that thoracentesis demonstrated metastatic adenocarcinoma. He states he was doing well since that time, but started experiencing increased SOB again over the past couple of days. He denies any  CP. He denies any fevers or chills. He reports some decreased appetite recently.   ED Course: In the ED, patient's initial vital signs demonstrated BP of 140/96 and HR of 117. He has been intermittently tachypneic. His O2 sat is stable on RA. He is afebrile and CBC does not show any leukocytosis. His CXR demonstrates sizable right pleural effusion with atelectasis and likely consolidation in significant portions of the right middle and lower lobes. He was given a breathing treatment.   Review of Systems: As per HPI otherwise 10 point review of systems negative.   Past Medical History:  Diagnosis Date  . Asthma   . Cancer ([place])   . COPD (chronic obstructive pulmonary disease) ([place])   . GERD (gastroesophageal reflux disease)   . High cholesterol   . History of petit-mal seizures   . Hyperlipidemia   . Hypertension   . Memory impairment   . OSA (obstructive sleep apnea)    Does not tolerate CPAP  . Pacemaker   . PTSD (post-traumatic stress disorder)   . Sick sinus syndrome [facility])     Past Surgical History:  Procedure Laterality Date  . CARDIAC SURGERY    . PACEMAKER INSERTION       reports that he has quit smoking. His smoking use included e-cigarettes. His smokeless tobacco use includes chew. He reports current alcohol use. He reports that he does not use drugs.  No Known Allergies  Family History  Problem Relation Age of Onset  . Alzheimer's disease Mother   . Cancer Mother   . Memory loss Father    Unacceptable: Noncontributory, unremarkable, or negative. Acceptable: Family history reviewed and not pertinent (If you reviewed it)  Prior to Admission medications   Medication Sig Start Date End Date Taking? Authorizing Provider  albuterol (PROVENTIL) (2.5 MG/3ML) 0.083% nebulizer solution Take 3 mLs (2.5 mg total) by nebulization 2 (two) times daily as needed for wheezing or shortness of breath. 04/09/18  Yes Irene Pap N, DO  atorvastatin (LIPITOR) 40 MG tablet Take 40 mg  by mouth at bedtime.   Yes [provider]  busPIRone (BUSPAR) 10 MG tablet Take 5 mg by mouth 2 (two) times daily.   Yes [provider]  Cholecalciferol (VITAMIN D3) 25 MCG (1000 UT) CAPS Take 1,000 Units by mouth daily.   Yes [provider]  donepezil (ARICEPT) 5 MG tablet Take 5 mg by mouth at bedtime.   Yes [provider]  escitalopram (LEXAPRO) 20 MG tablet Take 20 mg by mouth daily.    Yes [provider]  lisinopril (PRINIVIL,ZESTRIL) 5 MG tablet Take 5 mg by mouth daily.    Yes [provider]  pantoprazole (PROTONIX) 40 MG tablet Take 1 tablet (40 mg total) by mouth daily. 04/10/18  Yes Kayleen Memos, DO  vitamin B-12 (CYANOCOBALAMIN) 500 MCG tablet Take 500 mcg by mouth daily.   Yes [provider]  azithromycin (ZITHROMAX) 250 MG tablet Take 1 tablet 500 mg daily x 5 days. Patient not taking: Reported on 04/20/2018 04/10/18   Kayleen Memos, DO  nicotine (NICODERM CQ - DOSED IN MG/24 HOURS) 21 mg/24hr patch Place 1 patch (21 mg total)  onto the skin daily. Patient not taking: Reported on 04/20/2018 04/10/18   Kayleen Memos, DO    Physical Exam: Vitals:   04/20/18 1148 04/20/18 1150 04/20/18 1200 04/20/18 1230  BP: (!) 140/96     Pulse: (!) 117     Resp: 18 (!) 32 (!) 21 19  Temp: 98.1 F (36.7 C)     TempSrc: Oral     SpO2: 96%     Weight: 112 kg     Height: 6' (1.829 m)         Constitutional: NAD, calm, comfortable Vitals:   04/20/18 1148 04/20/18 1150 04/20/18 1200 04/20/18 1230  BP: (!) 140/96     Pulse: (!) 117     Resp: 18 (!) 32 (!) 21 19  Temp: 98.1 F (36.7 C)     TempSrc: Oral     SpO2: 96%     Weight: 112 kg     Height: 6' (1.829 m)      HEENT: PERRL, lids and conjunctivae normal Respiratory: Some increased respiratory effort. Diminished lung sounds on right posteriorly with some dullness to percussion over the right posterior lung field. No wheezing, no crackles. No accessory muscle use.    Cardiovascular: Mildly tachycardic, regular rhythm, no murmurs / rubs / gallops. No extremity edema. 2+ pedal pulses.  Abdomen: no tenderness, no masses palpated. No hepatosplenomegaly.  Musculoskeletal: Able to move all extremities.  Skin: no rashes, lesions, ulcers.  Psychiatric: Normal judgment and insight. Alert and oriented x 3. Normal mood.   Labs on Admission: I have personally reviewed following labs and imaging studies  CBC: Recent Labs  Lab 04/20/18 1303  WBC 9.4  NEUTROABS 6.6  HGB 17.3*  HCT 54.3*  MCV 89.5  PLT 950   Basic Metabolic Panel: Recent Labs  Lab 04/20/18 1303  NA 141  K 3.4*  CL 107  CO2 24  GLUCOSE 91  BUN 9  CREATININE 0.98  CALCIUM 8.3*   GFR: Estimated Creatinine Clearance: 103.6 mL/min (by C-G formula based on SCr of 0.98 mg/dL). Liver Function Tests: Recent Labs  Lab 04/20/18 1303  AST 20  ALT 35  ALKPHOS 75  BILITOT 0.5  PROT 6.8  ALBUMIN 4.1   No results for input(s): LIPASE, AMYLASE in the last 168 hours. No results for input(s): AMMONIA in the last 168 hours. Coagulation Profile: No results for input(s): INR, PROTIME in the last 168 hours. Cardiac Enzymes: Recent Labs  Lab 04/20/18 1303  TROPONINI <0.03   BNP (last 3 results) No results for input(s): PROBNP in the last 8760 hours. HbA1C: No results for input(s): HGBA1C in the last 72 hours. CBG: No results for input(s): GLUCAP in the last 168 hours. Lipid Profile: No results for input(s): CHOL, HDL, LDLCALC, TRIG, CHOLHDL, LDLDIRECT in the last 72 hours. Thyroid Function Tests: No results for input(s): TSH, T4TOTAL, FREET4, T3FREE, THYROIDAB in the last 72 hours. Anemia Panel: No results for input(s): VITAMINB12, FOLATE, FERRITIN, TIBC, IRON, RETICCTPCT in the last 72 hours. Urine analysis: No results found for: COLORURINE, APPEARANCEUR, LABSPEC, PHURINE, GLUCOSEU, HGBUR, BILIRUBINUR, KETONESUR, PROTEINUR, UROBILINOGEN, NITRITE, LEUKOCYTESUR Sepsis  Labs: @LABRCNTIP (procalcitonin:4,lacticidven:4) )No results found for this or any previous visit (from the past 240 hour(s)).   Radiological Exams on Admission: Dg Chest 2 View  Result Date: 04/20/2018 CLINICAL DATA:  Cough and shortness of breath EXAM: CHEST - 2 VIEW COMPARISON:  April 08, 2018 FINDINGS: There is a sizable pleural effusion on the right with atelectasis and likely consolidation  in much of the right middle and lower lobe regions. The left lung is clear. Heart size and pulmonary vascularity normal. Pacemaker lead tips are attached to the right atrium and right ventricle. No adenopathy. No bone lesions. IMPRESSION: Sizable right pleural effusion with atelectasis and likely consolidation in significant portions of the right middle and lower lobes. Left lung clear. Heart size within normal limits. Pacemaker leads attached to right atrium and right ventricle. Electronically Signed   By: Lowella Grip III M.D.   On: 04/20/2018 12:55    Assessment/Plan  1. Recurrent Right Pleural Effusion- Patient has recurrent right pleural effusion seen on CXR today. He was recently admitted on 1/23 with pleural effusion and PNA. He underwent thoracentesis and cytology report of his fluid demonstrated metastatic adenocarcinoma. He is planning on undergoing treatment at the [place] and is scheduled for oncology visit on 05/03/18. Will hold on obtaining CT chest here since patient is going to be receiving care through the [place]. Will order US Thoracentesis. Will send fluid for gram stain and cell count to rule out infection. He does not have any fever or chills.   2. COPD/Asthma-Stable. Patient was given breathing treatment in the ED. His O2 sat is stable on RA. Will continue home Proventil as needed.   3. HTN- Patient's BP noted to be elevated in ED. Will continue home Lisinopril and continue to monitor BP.   4. Memory Impairment, PTSD- Stable. Will continue home Buspirone, Donepezil, and Escitalopram.   5.  HLD- Stable. Continue home Atorvastatin.   6. Sick Sinus Syndrome w/ pacemaker placement- Stable. His last Echo was during his last admission. He is CP free. Will continue to monitor Telemetry.   7. GERD- Stable. Continue home Protonix.   DVT prophylaxis: SCD's  Code Status: FULL  Family Communication: None Disposition Plan: Admit to observation Consults called: none   Romie Minus, PA-S

## 2018-04-20 NOTE — ED Provider Notes (Signed)
Emergency Department Provider Note   I have reviewed the triage vital signs and the nursing notes.   HISTORY  Chief Complaint Shortness of Breath   HPI Daniel Bryant is a 58 y.o. male with PMH of COPD, GERD, HLD, HTN, OSA, SSS with pacemaker, and recently diagnosed lung CA and large right pleural effusion returns to the emergency department with progressively worsening shortness of breath over the past week.  Patient is followed by oncology at the [facility].  He was admitted in late January with large right pleural effusion which was drained and produced nearly 3.5 L of fluid.  He was told yesterday by his oncologist at the [facility] that his testing was positive for lung cancer and has appointments later this month for follow-up to create a treatment plan.  Patient returns today as he feels he is reaccumulated his fluid.  He has been trying albuterol inhalers at home and nebulizers without significant improvement.  No productive cough.  No fevers or chills.  No abdominal symptoms.    Past Medical History:  Diagnosis Date  . Asthma   . Cancer ([place])   . COPD (chronic obstructive pulmonary disease) ([place])   . GERD (gastroesophageal reflux disease)   . High cholesterol   . History of petit-mal seizures   . Hyperlipidemia   . Hypertension   . Memory impairment   . OSA (obstructive sleep apnea)    Does not tolerate CPAP  . Pacemaker   . PTSD (post-traumatic stress disorder)   . Sick sinus syndrome [facility])     Patient Active Problem List   Diagnosis Date Noted  . 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 Pleural effusion on right 04/05/2018  . Hypokalemia 04/05/2018  . High cholesterol 04/05/2018  . Hypertension 04/05/2018  . COPD with acute exacerbation ([address]) 04/05/2018  . Polycythemia 04/05/2018  . CAP (community acquired pneumonia) 04/05/2018    Past Surgical History:  Procedure Laterality Date  . CARDIAC SURGERY    . PACEMAKER INSERTION      Allergies Patient has no known  allergies.  Family History  Problem Relation Age of Onset  . Alzheimer's disease Mother   . Cancer Mother   . Memory loss Father     Social History Social History   Tobacco Use  . Smoking status: Former Smoker    Types: E-cigarettes  . Smokeless tobacco: Current User    Types: Chew  Substance Use Topics  . Alcohol use: Yes    Frequency: Never  . Drug use: No    Review of Systems  Constitutional: No fever/chills Eyes: No visual changes. ENT: No sore throat. Cardiovascular: Positive sharp central chest pain. Respiratory: Positive shortness of breath. Gastrointestinal: No abdominal pain.  No nausea, no vomiting.  No diarrhea.  No constipation. Genitourinary: Negative for dysuria. Musculoskeletal: Negative for back pain. Skin: Negative for rash. Neurological: Negative for headaches, focal weakness or numbness.  10-point ROS otherwise negative.  ____________________________________________   PHYSICAL EXAM:  VITAL SIGNS: ED Triage Vitals  Enc Vitals Group     BP 04/20/18 1148 (!) 140/96     Pulse Rate 04/20/18 1148 (!) 117     Resp 04/20/18 1148 18     Temp 04/20/18 1148 98.1 F (36.7 C)     Temp Source 04/20/18 1148 Oral     SpO2 04/20/18 1148 96 %     Weight 04/20/18 1148 247 lb (112 kg)     Height 04/20/18 1148 6' (1.829 m)  Pain Score 04/20/18 1145 0   Constitutional: Alert and oriented. Well appearing and in no acute distress. Eyes: Conjunctivae are normal.  Head: Atraumatic. Nose: No congestion/rhinnorhea. Mouth/Throat: Mucous membranes are moist.  Neck: No stridor.  Cardiovascular: Tachycardia. Good peripheral circulation. Grossly normal heart sounds.   Respiratory: Slight increased respiratory effort.  No retractions. Lungs diminished on the right.  Gastrointestinal: Soft and nontender. No distention.  Musculoskeletal: No lower extremity tenderness nor edema. No gross deformities of extremities. Neurologic:  Normal speech and language. No gross  focal neurologic deficits are appreciated.  Skin:  Skin is warm, dry and intact. No rash noted.  ____________________________________________   LABS (all labs ordered are listed, but only abnormal results are displayed)  Labs Reviewed  COMPREHENSIVE METABOLIC PANEL - Abnormal; Notable for the following components:      Result Value   Potassium 3.4 (*)    Calcium 8.3 (*)    All other components within normal limits  CBC WITH DIFFERENTIAL/PLATELET - Abnormal; Notable for the following components:   RBC 6.07 (*)    Hemoglobin 17.3 (*)    HCT 54.3 (*)    All other components within normal limits  TROPONIN I   ____________________________________________  EKG   EKG Interpretation  Date/Time:  Friday April 20 2018 11:47:03 EST Ventricular Rate:  111 PR Interval:    QRS Duration: 91 QT Interval:  329 QTC Calculation: 447 R Axis:   28 Text Interpretation:  Sinus tachycardia Abnormal R-wave progression, late transition Baseline wander in lead(s) II III aVF No STEMI.  Confirmed by Nanda Quinton [phone]) on 04/20/2018 12:21:51 PM       ____________________________________________  RADIOLOGY  Dg Chest 2 View  Result Date: 04/20/2018 CLINICAL DATA:  Cough and shortness of breath EXAM: CHEST - 2 VIEW COMPARISON:  April 08, 2018 FINDINGS: There is a sizable pleural effusion on the right with atelectasis and likely consolidation in much of the right middle and lower lobe regions. The left lung is clear. Heart size and pulmonary vascularity normal. Pacemaker lead tips are attached to the right atrium and right ventricle. No adenopathy. No bone lesions. IMPRESSION: Sizable right pleural effusion with atelectasis and likely consolidation in significant portions of the right middle and lower lobes. Left lung clear. Heart size within normal limits. Pacemaker leads attached to right atrium and right ventricle. Electronically Signed   By: Lowella Grip III M.D.   On: 04/20/2018 12:55     ____________________________________________   PROCEDURES  Procedure(s) performed:   Procedures  None  ____________________________________________   INITIAL IMPRESSION / ASSESSMENT AND PLAN / ED COURSE  Pertinent labs & imaging results that were available during my care of the patient were reviewed by me and considered in my medical decision making (see chart for details).  Patient returns to the emergency department with shortness of breath.  He was recently diagnosed with lung cancer and is followed at the [place].  They are meeting later this month to develop a treatment plan.  No fever, chills, other flu/pneumonia type symptoms.  Patient has diminished breath sounds on the right.  I suspect reaccumulation of his large right pleural effusion.    CXR with large right effusion. No significant lab abnormalities.  Troponin is normal.  Patient heart rate decreasing with nebulizer treatment and work of breathing improved.   Discussed patient's case with Hospitalist, Dr. Cruzita Lederer to request admission. Patient and family (if present) updated with plan. Care transferred to Hospitalist service.  I reviewed all nursing notes,  vitals, pertinent old records, EKGs, labs, imaging (as available).  ____________________________________________  FINAL CLINICAL IMPRESSION(S) / ED DIAGNOSES  Final diagnoses:  Pleural effusion  SOB (shortness of breath)    MEDICATIONS GIVEN DURING THIS VISIT:  Medications  ipratropium-albuterol (DUONEB) 0.5-2.5 (3) MG/3ML nebulizer solution 3 mL (3 mLs Nebulization Given 04/20/18 1251)    Note:  This document was prepared using Dragon voice recognition software and may include unintentional dictation errors.  Nanda Quinton, MD Emergency Medicine    Burnetta Kohls, [facility], MD 04/20/18 [phone]

## 2018-04-20 NOTE — Progress Notes (Signed)
ED TO INPATIENT HANDOFF REPORT  Name/Age/Gender Daniel Bryant 58 y.o. male  Code Status Code Status History    Date Active Date Inactive Code Status Order ID Comments User Context   04/05/2018 1835 04/09/2018 2124 Full Code [id number]  Reubin Milan, MD Inpatient   04/05/2018 1239 04/05/2018 [place] Full Code [id number]  Reubin Milan, MD ED      Home/SNF/Other Home  Chief Complaint Shortness of Breath  Level of Care/Admitting Diagnosis ED Disposition    ED Disposition Condition [place]: [facility] [[id number]]  Level of Care: Med-Surg [16]  Diagnosis: Malignant pleural effusion [511.81.ICD-9-CM]  Admitting Physician: Caren Griffins [[id number]]  Attending Physician: Caren Griffins [phone]  PT Class (Do Not Modify): Observation [104]  PT Acc Code (Do Not Modify): Observation [10022]       Medical History Past Medical History:  Diagnosis Date  . Asthma   . Cancer ([place])   . COPD (chronic obstructive pulmonary disease) ([place])   . GERD (gastroesophageal reflux disease)   . High cholesterol   . History of petit-mal seizures   . Hyperlipidemia   . Hypertension   . Memory impairment   . OSA (obstructive sleep apnea)    Does not tolerate CPAP  . Pacemaker   . PTSD (post-traumatic stress disorder)   . Sick sinus syndrome ([place])     Allergies No Known Allergies  IV Location/Drains/Wounds Patient Lines/Drains/Airways Status   Active Line/Drains/Airways    Name:   Placement date:   Placement time:   Site:   Days:   Peripheral IV 04/05/18 Left;Posterior Forearm   04/05/18    2040    Forearm   15   Peripheral IV 04/20/18 Left Antecubital   04/20/18    1258    Antecubital   less than 1          Labs/Imaging Results for orders placed or performed during the hospital encounter of 04/20/18 (from the past 48 hour(s))  Comprehensive metabolic panel     Status: Abnormal   Collection Time: 04/20/18  1:03 PM  Result Value Ref  Range   Sodium 141 135 - 145 mmol/L   Potassium 3.4 (L) 3.5 - 5.1 mmol/L   Chloride 107 98 - 111 mmol/L   CO2 24 22 - 32 mmol/L   Glucose, Bld 91 70 - 99 mg/dL   BUN 9 6 - 20 mg/dL   Creatinine, Ser 0.98 0.61 - 1.24 mg/dL   Calcium 8.3 (L) 8.9 - 10.3 mg/dL   Total Protein 6.8 6.5 - 8.1 g/dL   Albumin 4.1 3.5 - 5.0 g/dL   AST 20 15 - 41 U/L   ALT 35 0 - 44 U/L   Alkaline Phosphatase 75 38 - 126 U/L   Total Bilirubin 0.5 0.3 - 1.2 mg/dL   GFR calc non Af Amer >60 >60 mL/min   GFR calc Af Amer >60 >60 mL/min   Anion gap 10 5 - 15    Comment: Performed at [facility], [place] [address], [place], [place] [address]  CBC with Differential     Status: Abnormal   Collection Time: 04/20/18  1:03 PM  Result Value Ref Range   WBC 9.4 4.0 - 10.5 K/uL   RBC 6.07 (H) 4.22 - 5.81 MIL/uL   Hemoglobin 17.3 (H) 13.0 - 17.0 g/dL   HCT 54.3 (H) 39.0 - 52.0 %   MCV 89.5 80.0 - 100.0 fL  MCH 28.5 26.0 - 34.0 pg   MCHC 31.9 30.0 - 36.0 g/dL   RDW 14.1 11.5 - 15.5 %   Platelets 181 150 - 400 K/uL   nRBC 0.0 0.0 - 0.2 %   Neutrophils Relative % 70 %   Neutro Abs 6.6 1.7 - 7.7 K/uL   Lymphocytes Relative 19 %   Lymphs Abs 1.8 0.7 - 4.0 K/uL   Monocytes Relative 5 %   Monocytes Absolute 0.5 0.1 - 1.0 K/uL   Eosinophils Relative 5 %   Eosinophils Absolute 0.5 0.0 - 0.5 K/uL   Basophils Relative 1 %   Basophils Absolute 0.1 0.0 - 0.1 K/uL   Immature Granulocytes 0 %   Abs Immature Granulocytes 0.04 0.00 - 0.07 K/uL    Comment: Performed at [facility], [place] [address] [place], [place] [address]  Troponin I - Once     Status: None   Collection Time: 04/20/18  1:03 PM  Result Value Ref Range   Troponin I <0.03 <0.03 ng/mL    Comment: Performed at [facility], [place] [address] [place], [place] [address]   Dg Chest 2 View  Result Date: 04/20/2018 CLINICAL DATA:  Cough and shortness of breath EXAM: CHEST - 2 VIEW COMPARISON:  April 08, 2018  FINDINGS: There is a sizable pleural effusion on the right with atelectasis and likely consolidation in much of the right middle and lower lobe regions. The left lung is clear. Heart size and pulmonary vascularity normal. Pacemaker lead tips are attached to the right atrium and right ventricle. No adenopathy. No bone lesions. IMPRESSION: Sizable right pleural effusion with atelectasis and likely consolidation in significant portions of the right middle and lower lobes. Left lung clear. Heart size within normal limits. Pacemaker leads attached to right atrium and right ventricle. Electronically Signed   By: Lowella Grip III M.D.   On: 04/20/2018 12:55    Pending Labs [employer] (From admission, onward)    Start     Ordered   Signed and Occupational hygienist morning,   R     Signed and Held   Signed and Held  CBC  Tomorrow morning,   R     Signed and Held          Vitals/Pain Today's Vitals   04/20/18 1330 04/20/18 1400 04/20/18 1430 04/20/18 1500  BP: (!) 130/98 (!) 147/102 123/83 106/74  Pulse: 90 (!) 103 97 98  Resp: (!) 23 19    Temp:      TempSrc:      SpO2: 99% 96% 93% 91%  Weight:      Height:      PainSc:        Isolation Precautions No active isolations  Medications Medications  ipratropium-albuterol (DUONEB) 0.5-2.5 (3) MG/3ML nebulizer solution 3 mL (3 mLs Nebulization Given 04/20/18 1251)    Mobility walks

## 2018-04-21 ENCOUNTER — Observation Stay (HOSPITAL_COMMUNITY): Payer: No Typology Code available for payment source

## 2018-04-21 DIAGNOSIS — J90 Pleural effusion, not elsewhere classified: Secondary | ICD-10-CM | POA: Diagnosis not present

## 2018-04-21 DIAGNOSIS — Z950 Presence of cardiac pacemaker: Secondary | ICD-10-CM | POA: Diagnosis present

## 2018-04-21 LAB — BASIC METABOLIC PANEL
Anion gap: 8 (ref 5–15)
BUN: 13 mg/dL (ref 6–20)
CO2: 28 mmol/L (ref 22–32)
Calcium: 8.3 mg/dL — ABNORMAL LOW (ref 8.9–10.3)
Chloride: 103 mmol/L (ref 98–111)
Creatinine, Ser: 1.02 mg/dL (ref 0.61–1.24)
Glucose, Bld: 107 mg/dL — ABNORMAL HIGH (ref 70–99)
Potassium: 4.2 mmol/L (ref 3.5–5.1)
Sodium: 139 mmol/L (ref 135–145)

## 2018-04-21 LAB — BODY FLUID CELL COUNT WITH DIFFERENTIAL
EOS FL: 6 %
Lymphs, Fluid: 67 %
Monocyte-Macrophage-Serous Fluid: 26 % — ABNORMAL LOW (ref 50–90)
Neutrophil Count, Fluid: 1 % (ref 0–25)
WBC FLUID: 890 uL (ref 0–1000)

## 2018-04-21 LAB — CBC
HCT: 52.2 % — ABNORMAL HIGH (ref 39.0–52.0)
Hemoglobin: 16.2 g/dL (ref 13.0–17.0)
MCH: 28.2 pg (ref 26.0–34.0)
MCHC: 31 g/dL (ref 30.0–36.0)
MCV: 90.8 fL (ref 80.0–100.0)
Platelets: 174 10*3/uL (ref 150–400)
RBC: 5.75 MIL/uL (ref 4.22–5.81)
RDW: 14.1 % (ref 11.5–15.5)
WBC: 10.2 10*3/uL (ref 4.0–10.5)
nRBC: 0 % (ref 0.0–0.2)

## 2018-04-21 MED ORDER — AZITHROMYCIN 250 MG PO TABS
500.0000 mg | ORAL_TABLET | Freq: Every day | ORAL | Status: AC
  Administered 2018-04-21: 500 mg via ORAL
  Filled 2018-04-21: qty 2

## 2018-04-21 MED ORDER — AZITHROMYCIN 250 MG PO TABS: 250.0000 mg | ORAL_TABLET | Freq: Every day | ORAL | Status: DC

## 2018-04-21 MED ORDER — SODIUM CHLORIDE 0.9 % IV SOLN
1.0000 g | INTRAVENOUS | Status: DC
  Administered 2018-04-21: 1 g via INTRAVENOUS
  Filled 2018-04-21: qty 1

## 2018-04-21 MED ORDER — AMOXICILLIN 500 MG PO TABS: 500.0000 mg | ORAL_TABLET | Freq: Two times a day (BID) | ORAL | 0 refills | Status: DC

## 2018-04-21 MED ORDER — SODIUM CHLORIDE 0.9 % IV SOLN
INTRAVENOUS | Status: DC | PRN
  Administered 2018-04-21: 250 mL via INTRAVENOUS

## 2018-04-21 MED ORDER — AZITHROMYCIN 250 MG PO TABS: 250.0000 mg | ORAL_TABLET | Freq: Every day | ORAL | 0 refills | Status: DC

## 2018-04-21 MED ORDER — LIDOCAINE HCL 1 % IJ SOLN
INTRAMUSCULAR | Status: AC
  Filled 2018-04-21: qty 20

## 2018-04-21 NOTE — Progress Notes (Signed)
PROCEDURE SUMMARY:  Successful US guided right thoracentesis. Yielded 2.4 liters of yellow fluid. Pt tolerated procedure well. No immediate complications.  Specimen was not sent for labs. CXR ordered.  EBL < 5 mL  Docia Barrier PA-C 04/21/2018 3:50 PM

## 2018-04-21 NOTE — Discharge Summary (Addendum)
Discharge Summary  Daniel Bryant IRS:[id number] DOB: 1961/01/16  PCP: Patient, No Pcp Per  Admit date: 04/20/2018 Discharge date: 04/21/2018  Time spent: 25 minutes  Recommendations for Outpatient Follow-up:  1. Follow-up with [facility] provider in 1 to 2 weeks and oncologist  Discharge Diagnoses:  Active Hospital Problems   Diagnosis Date Noted  . Pleural effusion on right 04/05/2018  . Pacemaker 04/21/2018  . 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 COPD with acute exacerbation ([place]) 04/05/2018    Resolved Hospital Problems  No resolved problems to display.    Discharge Condition: Improved  Diet recommendation: Regular  Vitals:   04/21/18 0608 04/21/18 1351  BP: (!) 149/94 (!) 135/95  Pulse: 85 92  Resp: 18 18  Temp: 98 F (36.7 C) 99.3 F (37.4 C)  SpO2: 93% 92%    History of present illness:   Patient is 58 year old male with history of hypertension, hyperlipidemia, COPD, sick sinus syndrome with pacemaker placement (prior to that he has had several syncopal episodes and felt to have a degree of anoxia), memory problems who was recently admitted to this hospital in January 2020 with pneumonia and also thought to have a parapneumonic effusion.  He was treated with antibiotics and underwent thoracentesis x2, and cytology eventually revealed metastatic adenocarcinoma.  [facility] and [facility].  Patient stated he has been seen recently at the [place] he has a follow-up appointment. He became more short of breath over the last couple of days, feels like he cannot catch his breath while walking, and came back to the ER.  Patient was admitted to observation and was to have  thoracentesis.  Which she did he felt much better and he is being discharged in improved condition   Hospital Course:  Principal Problem:   Pleural effusion on right Active Problems:   COPD with acute exacerbation (HCC)   Malignant  pleural effusion   Pacemaker  Patient was admitted to observation and was to have  thoracentesis.  Which he did without any complication, he felt much better and he is being discharged in improved condition his x-ray initially showed possible pneumonia after that thoracentesis a repeat x-ray shows opacities which may be the metastatic cancer. Patient advised to follow-up with the oncologist at the [facility]. Procedures:  Thoracentesis  Consultations:  Vision radiology  Discharge Exam: BP (!) 135/95 (BP Location: Right Arm)   Pulse 92   Temp 99.3 F (37.4 C) (Oral)   Resp 18   Ht 6' (1.829 m)   Wt 112 kg   SpO2 92%   BMI 33.50 kg/m   General: Retroverted x3 Cardiovascular: Heart rate slightly elevated no murmur Respiratory: Clear to auscultation bilaterally  Discharge Instructions You were cared for by a hospitalist during your hospital stay. If you have any questions about your discharge medications or the care you received while you were in the hospital after you are discharged, you can call the unit and asked to speak with the hospitalist on call if the hospitalist that took care of you is not available. Once you are discharged, your primary care physician will handle any further medical issues. Please note that NO REFILLS for any discharge medications will be authorized once you are discharged, as it is imperative that you return to your primary care physician (or establish a relationship with a primary care physician if you do not have one) for your aftercare needs so that  they can reassess your need for medications and monitor your lab values.  Discharge Instructions    Call MD for:  temperature >100.4   Complete by:  As directed    Diet - low sodium heart healthy   Complete by:  As directed    Discharge instructions   Complete by:  As directed    Follow-up primary care provider at the [facility] within 1 to 2 weeks and follow-up for the oncology care per [facility]   Increase activity slowly    Complete by:  As directed      Allergies as of 04/21/2018   No Known Allergies     Medication List    TAKE these medications   albuterol (2.5 MG/3ML) 0.083% nebulizer solution Commonly known as:  PROVENTIL Take 3 mLs (2.5 mg total) by nebulization 2 (two) times daily as needed for wheezing or shortness of breath.   amoxicillin 500 MG tablet Commonly known as:  AMOXIL Take 1 tablet (500 mg total) by mouth 2 (two) times daily.   atorvastatin 40 MG tablet Commonly known as:  LIPITOR Take 40 mg by mouth at bedtime.   azithromycin 250 MG tablet Commonly known as:  ZITHROMAX Take 1 tablet (250 mg total) by mouth daily. What changed:    how much to take  how to take this  when to take this  additional instructions   busPIRone 10 MG tablet Commonly known as:  BUSPAR Take 5 mg by mouth 2 (two) times daily.   donepezil 5 MG tablet Commonly known as:  ARICEPT Take 5 mg by mouth at bedtime.   escitalopram 20 MG tablet Commonly known as:  LEXAPRO Take 20 mg by mouth daily.   lisinopril 5 MG tablet Commonly known as:  PRINIVIL,ZESTRIL Take 5 mg by mouth daily.   nicotine 21 mg/24hr patch Commonly known as:  NICODERM CQ - dosed in mg/24 hours Place 1 patch (21 mg total) onto the skin daily.   pantoprazole 40 MG tablet Commonly known as:  PROTONIX Take 1 tablet (40 mg total) by mouth daily.   vitamin B-12 500 MCG tablet Commonly known as:  CYANOCOBALAMIN Take 500 mcg by mouth daily.   Vitamin D3 25 MCG (1000 UT) Caps Take 1,000 Units by mouth daily.      No Known Allergies    The results of significant diagnostics from this hospitalization (including imaging, microbiology, ancillary and laboratory) are listed below for reference.    Significant Diagnostic Studies: Dg Chest 1 View  Result Date: 04/21/2018 CLINICAL DATA:  Status post thoracentesis. EXAM: CHEST  1 VIEW COMPARISON:  Chest radiograph 04/20/2018 FINDINGS: Multi lead pacer apparatus overlies the  right hemithorax. Stable cardiac and mediastinal contours. Interval decrease in size of now small to moderate right pleural effusion with underlying opacities. No pneumothorax. Left lung is clear. IMPRESSION: Interval decrease in size of now small to moderate right pleural effusion with underlying opacities. Electronically Signed   By: Lovey Newcomer M.D.   On: 04/21/2018 16:27   Dg Chest 1 View  Result Date: 04/08/2018 CLINICAL DATA:  Status post right thoracentesis. EXAM: CHEST  1 VIEW COMPARISON:  Film earlier today at 1135 hours FINDINGS: No visualized pneumothorax with significantly less right pleural fluid volume after thoracentesis. Stable heart size and appearance of pacemaker. No pulmonary edema or airspace consolidation. IMPRESSION: Significant decrease in right pleural fluid volume after thoracentesis. No pneumothorax identified. Electronically Signed   By: Aletta Edouard M.D.   On: 04/08/2018 15:12  Dg Chest 1 View  Result Date: 04/05/2018 CLINICAL DATA:  Post thoracentesis, history of COPD EXAM: CHEST  1 VIEW COMPARISON:  None. FINDINGS: RIGHT-sided pacemaker overlies stable enlarged cardiac silhouette. Large RIGHT effusion is decreased slightly in volume compared to prior. No pneumothorax. IMPRESSION: Decrease in RIGHT effusion.  No pneumothorax. Persistent large RIGHT effusion remains. Electronically Signed   By: Suzy Bouchard M.D.   On: 04/05/2018 13:53   Dg Chest 2 View  Result Date: 04/20/2018 CLINICAL DATA:  Cough and shortness of breath EXAM: CHEST - 2 VIEW COMPARISON:  April 08, 2018 FINDINGS: There is a sizable pleural effusion on the right with atelectasis and likely consolidation in much of the right middle and lower lobe regions. The left lung is clear. Heart size and pulmonary vascularity normal. Pacemaker lead tips are attached to the right atrium and right ventricle. No adenopathy. No bone lesions. IMPRESSION: Sizable right pleural effusion with atelectasis and likely  consolidation in significant portions of the right middle and lower lobes. Left lung clear. Heart size within normal limits. Pacemaker leads attached to right atrium and right ventricle. Electronically Signed   By: Lowella Grip III M.D.   On: 04/20/2018 12:55   Dg Chest Port 1 View  Result Date: 04/08/2018 CLINICAL DATA:  Hypoxia EXAM: PORTABLE CHEST 1 VIEW COMPARISON:  Three days ago FINDINGS: Moderate right pleural effusion, less than seen previously. The left chest remains clear. Normal heart size with dual-chamber pacer leads that are partially visualized. IMPRESSION: Moderate right pleural effusion that has decreased from study 3 days ago. Electronically Signed   By: Monte Fantasia M.D.   On: 04/08/2018 13:46   Dg Chest Portable 1 View  Result Date: 04/05/2018 CLINICAL DATA:  Shortness of breath for the past 24 hours. Missed asthma medications yesterday. EXAM: PORTABLE CHEST 1 VIEW COMPARISON:  None. FINDINGS: Large right pleural effusion mild adjacent right lung atelectasis. Clear left lung. Minimal diffuse peribronchial thickening and accentuation of the interstitial markings. The right heart borders are obscured by the pleural fluid with no gross cardiac enlargement. Right subclavian pacemaker leads in satisfactory position. Unremarkable bones. IMPRESSION: 1. Large right pleural effusion with mild adjacent right lung atelectasis. 2. Minimal chronic bronchitic changes. Electronically Signed   By: Claudie Revering M.D.   On: 04/05/2018 10:35   US Thoracentesis Asp Pleural Space W/img Guide  Result Date: 04/09/2018 INDICATION: Patient with history of CAP, COPD, asthma, dyspnea, and recurrent right pleural effusion. Request is made for therapeutic right thoracentesis. EXAM: ULTRASOUND GUIDED THERAPEUTIC RIGHT THORACENTESIS MEDICATIONS: 10 mL of 1% lidocaine COMPLICATIONS: None immediate. PROCEDURE: An ultrasound guided thoracentesis was thoroughly discussed with the patient and questions answered.  The benefits, risks, alternatives and complications were also discussed. The patient understands and wishes to proceed with the procedure. Written consent was obtained. Ultrasound was performed to localize and mark an adequate pocket of fluid in the right chest. The area was then prepped and draped in the normal sterile fashion. 1% Lidocaine was used for local anesthesia. Under ultrasound guidance a 6 Fr Safe-T-Centesis catheter was introduced. Thoracentesis was performed. The catheter was removed and a dressing applied. FINDINGS: A total of approximately 1.7 L of hazy gold fluid was removed. IMPRESSION: Successful ultrasound guided right thoracentesis yielding 1.7 L of pleural fluid. Read by: Earley Abide, PA-C Electronically Signed   By: Aletta Edouard M.D.   On: 04/08/2018 15:22   US Thoracentesis Asp Pleural Space W/img Guide  Result Date: 04/05/2018 INDICATION: Patient with history of  COPD, asthma, dyspnea, and right pleural effusion. Request is made for diagnostic and therapeutic right thoracentesis. EXAM: ULTRASOUND GUIDED DIAGNOSTIC AND THERAPEUTIC RIGHT THORACENTESIS MEDICATIONS: 10 mL 1% lidocaine COMPLICATIONS: None immediate. PROCEDURE: An ultrasound guided thoracentesis was thoroughly discussed with the patient and questions answered. The benefits, risks, alternatives and complications were also discussed. The patient understands and wishes to proceed with the procedure. Written consent was obtained. Ultrasound was performed to localize and mark an adequate pocket of fluid in the right chest. The area was then prepped and draped in the normal sterile fashion. 1% Lidocaine was used for local anesthesia. Under ultrasound guidance a 6 Fr Safe-T-Centesis catheter was introduced. Thoracentesis was performed. The catheter was removed and a dressing applied. FINDINGS: A total of approximately 2 L of clear gold fluid was removed. Samples were sent to the laboratory as requested by the clinical team.  IMPRESSION: Successful ultrasound guided right thoracentesis yielding 2 L of pleural fluid. Read by: Earley Abide, PA-C Electronically Signed   By: Jerilynn Mages.  Shick M.D.   On: 04/05/2018 14:05    Microbiology: No results found for this or any previous visit (from the past 240 hour(s)).   Labs: Basic Metabolic Panel: Recent Labs  Lab 04/20/18 1303 04/21/18 0519  NA 141 139  K 3.4* 4.2  CL 107 103  CO2 24 28  GLUCOSE 91 107*  BUN 9 13  CREATININE 0.98 1.02  CALCIUM 8.3* 8.3*   Liver Function Tests: Recent Labs  Lab 04/20/18 1303  AST 20  ALT 35  ALKPHOS 75  BILITOT 0.5  PROT 6.8  ALBUMIN 4.1   No results for input(s): LIPASE, AMYLASE in the last 168 hours. No results for input(s): AMMONIA in the last 168 hours. CBC: Recent Labs  Lab 04/20/18 1303 04/21/18 0519  WBC 9.4 10.2  NEUTROABS 6.6  --   HGB 17.3* 16.2  HCT 54.3* 52.2*  MCV 89.5 90.8  PLT 181 174   Cardiac Enzymes: Recent Labs  Lab 04/20/18 1303  TROPONINI <0.03   BNP: BNP (last 3 results) Recent Labs    04/06/18 0601  BNP 74.1    ProBNP (last 3 results) No results for input(s): PROBNP in the last 8760 hours.  CBG: No results for input(s): GLUCAP in the last 168 hours.     Signed:  Cristal Deer, MD [facility] 04/21/2018, 4:42 PM

## 2018-04-21 NOTE — Progress Notes (Signed)
Patient discharged to home, all discharge medications and instructions reviewed and questions answered.

## 2018-04-22 LAB — GRAM STAIN

## 2018-04-23 LAB — PATHOLOGIST SMEAR REVIEW

## 2018-04-23 LAB — PH, BODY FLUID: pH, Body Fluid: 7.6

## 2018-05-01 ENCOUNTER — Emergency Department (HOSPITAL_COMMUNITY): Payer: No Typology Code available for payment source

## 2018-05-01 ENCOUNTER — Observation Stay (HOSPITAL_COMMUNITY): Payer: No Typology Code available for payment source

## 2018-05-01 ENCOUNTER — Inpatient Hospital Stay (HOSPITAL_COMMUNITY)
Admission: EM | Admit: 2018-05-01 | Discharge: 2018-05-03 | DRG: 180 | Disposition: A | Discharge status: Home / Self Care | Payer: No Typology Code available for payment source | Attending: Family Medicine | Admitting: Family Medicine

## 2018-05-01 ENCOUNTER — Encounter (HOSPITAL_COMMUNITY): Payer: Self-pay

## 2018-05-01 ENCOUNTER — Other Ambulatory Visit: Payer: Self-pay

## 2018-05-01 DIAGNOSIS — C771 Secondary and unspecified malignant neoplasm of intrathoracic lymph nodes: Secondary | ICD-10-CM | POA: Diagnosis present

## 2018-05-01 DIAGNOSIS — J90 Pleural effusion, not elsewhere classified: Secondary | ICD-10-CM | POA: Diagnosis not present

## 2018-05-01 DIAGNOSIS — Z950 Presence of cardiac pacemaker: Secondary | ICD-10-CM | POA: Diagnosis present

## 2018-05-01 DIAGNOSIS — F419 Anxiety disorder, unspecified: Secondary | ICD-10-CM | POA: Diagnosis present

## 2018-05-01 DIAGNOSIS — C799 Secondary malignant neoplasm of unspecified site: Secondary | ICD-10-CM

## 2018-05-01 DIAGNOSIS — J449 Chronic obstructive pulmonary disease, unspecified: Secondary | ICD-10-CM

## 2018-05-01 DIAGNOSIS — Z809 Family history of malignant neoplasm, unspecified: Secondary | ICD-10-CM

## 2018-05-01 DIAGNOSIS — Z9889 Other specified postprocedural states: Secondary | ICD-10-CM

## 2018-05-01 DIAGNOSIS — R0602 Shortness of breath: Secondary | ICD-10-CM | POA: Diagnosis not present

## 2018-05-01 DIAGNOSIS — G4733 Obstructive sleep apnea (adult) (pediatric): Secondary | ICD-10-CM | POA: Diagnosis present

## 2018-05-01 DIAGNOSIS — F329 Major depressive disorder, single episode, unspecified: Secondary | ICD-10-CM | POA: Diagnosis present

## 2018-05-01 DIAGNOSIS — Z87891 Personal history of nicotine dependence: Secondary | ICD-10-CM

## 2018-05-01 DIAGNOSIS — J910 Malignant pleural effusion: Secondary | ICD-10-CM | POA: Diagnosis present

## 2018-05-01 DIAGNOSIS — Z7952 Long term (current) use of systemic steroids: Secondary | ICD-10-CM

## 2018-05-01 DIAGNOSIS — Z79899 Other long term (current) drug therapy: Secondary | ICD-10-CM

## 2018-05-01 DIAGNOSIS — C3491 Malignant neoplasm of unspecified part of right bronchus or lung: Secondary | ICD-10-CM | POA: Diagnosis not present

## 2018-05-01 DIAGNOSIS — E785 Hyperlipidemia, unspecified: Secondary | ICD-10-CM | POA: Diagnosis present

## 2018-05-01 DIAGNOSIS — E872 Acidosis: Secondary | ICD-10-CM | POA: Diagnosis present

## 2018-05-01 DIAGNOSIS — E876 Hypokalemia: Secondary | ICD-10-CM | POA: Diagnosis present

## 2018-05-01 DIAGNOSIS — I10 Essential (primary) hypertension: Secondary | ICD-10-CM | POA: Diagnosis present

## 2018-05-01 DIAGNOSIS — J9600 Acute respiratory failure, unspecified whether with hypoxia or hypercapnia: Secondary | ICD-10-CM | POA: Diagnosis present

## 2018-05-01 DIAGNOSIS — I495 Sick sinus syndrome: Secondary | ICD-10-CM | POA: Diagnosis present

## 2018-05-01 DIAGNOSIS — K219 Gastro-esophageal reflux disease without esophagitis: Secondary | ICD-10-CM | POA: Diagnosis present

## 2018-05-01 DIAGNOSIS — F4310 Post-traumatic stress disorder, unspecified: Secondary | ICD-10-CM | POA: Diagnosis present

## 2018-05-01 LAB — COMPREHENSIVE METABOLIC PANEL
ALT: 30 U/L (ref 0–44)
AST: 25 U/L (ref 15–41)
Albumin: 3.9 g/dL (ref 3.5–5.0)
Alkaline Phosphatase: 107 U/L (ref 38–126)
Anion gap: 12 (ref 5–15)
BUN: 10 mg/dL (ref 6–20)
CO2: 24 mmol/L (ref 22–32)
CREATININE: 1.04 mg/dL (ref 0.61–1.24)
Calcium: 8.5 mg/dL — ABNORMAL LOW (ref 8.9–10.3)
Chloride: 103 mmol/L (ref 98–111)
GFR calc Af Amer: >60 mL/min (ref >60)
GFR calc non Af Amer: >60 mL/min (ref >60)
Glucose, Bld: 135 mg/dL — ABNORMAL HIGH (ref 70–99)
Potassium: 2.8 mmol/L — ABNORMAL LOW (ref 3.5–5.1)
Sodium: 139 mmol/L (ref 135–145)
Total Bilirubin: 0.7 mg/dL (ref 0.3–1.2)
Total Protein: 6.8 g/dL (ref 6.5–8.1)

## 2018-05-01 LAB — CBC WITH DIFFERENTIAL/PLATELET
Abs Immature Granulocytes: 0.05 10*3/uL (ref 0.00–0.07)
Basophils Absolute: 0.1 10*3/uL (ref 0.0–0.1)
Basophils Relative: 1 %
EOS PCT: 9 %
Eosinophils Absolute: 0.9 10*3/uL — ABNORMAL HIGH (ref 0.0–0.5)
HEMATOCRIT: 58.1 % — AB (ref 39.0–52.0)
HEMOGLOBIN: 18.6 g/dL — AB (ref 13.0–17.0)
Immature Granulocytes: 1 %
LYMPHS ABS: 1.8 10*3/uL (ref 0.7–4.0)
LYMPHS PCT: 19 %
MCH: 28.9 pg (ref 26.0–34.0)
MCHC: 32 g/dL (ref 30.0–36.0)
MCV: 90.4 fL (ref 80.0–100.0)
Monocytes Absolute: 0.4 10*3/uL (ref 0.1–1.0)
Monocytes Relative: 4 %
Neutro Abs: 6.3 10*3/uL (ref 1.7–7.7)
Neutrophils Relative %: 66 %
Platelets: 165 10*3/uL (ref 150–400)
RBC: 6.43 MIL/uL — ABNORMAL HIGH (ref 4.22–5.81)
RDW: 14.2 % (ref 11.5–15.5)
WBC: 9.5 10*3/uL (ref 4.0–10.5)
nRBC: 0 % (ref 0.0–0.2)

## 2018-05-01 LAB — BLOOD GAS, VENOUS
Acid-base deficit: 2.6 mmol/L — ABNORMAL HIGH (ref 0.0–2.0)
BICARBONATE: 20 mmol/L (ref 20.0–28.0)
FIO2: 21
O2 Saturation: 93.1 %
Patient temperature: 98.6
pCO2, Ven: 30.7 mmHg — ABNORMAL LOW (ref 44.0–60.0)
pH, Ven: 7.43 (ref 7.250–7.430)
pO2, Ven: 66 mmHg — ABNORMAL HIGH (ref 32.0–45.0)

## 2018-05-01 LAB — BRAIN NATRIURETIC PEPTIDE: B Natriuretic Peptide: 51.2 pg/mL (ref 0.0–100.0)

## 2018-05-01 LAB — BODY FLUID CELL COUNT WITH DIFFERENTIAL
Eos, Fluid: 12 %
Lymphs, Fluid: 47 %
Monocyte-Macrophage-Serous Fluid: 41 % — ABNORMAL LOW (ref 50–90)
Neutrophil Count, Fluid: 0 % (ref 0–25)
Total Nucleated Cell Count, Fluid: 651 cu mm (ref 0–1000)

## 2018-05-01 LAB — MAGNESIUM: Magnesium: 1.8 mg/dL (ref 1.7–2.4)

## 2018-05-01 LAB — GRAM STAIN

## 2018-05-01 LAB — LACTIC ACID, PLASMA
LACTIC ACID, VENOUS: 3.5 mmol/L — AB (ref 0.5–1.9)
Lactic Acid, Venous: 2.4 mmol/L (ref 0.5–1.9)
Lactic Acid, Venous: 5.7 mmol/L (ref 0.5–1.9)
Lactic Acid, Venous: 6.7 mmol/L (ref 0.5–1.9)
Lactic Acid, Venous: 5.6 mmol/L (ref 0.5–1.9)

## 2018-05-01 LAB — I-STAT TROPONIN, ED: Troponin i, poc: 0.02 ng/mL (ref 0.00–0.08)

## 2018-05-01 LAB — MRSA PCR SCREENING: MRSA by PCR: NEGATIVE

## 2018-05-01 MED ORDER — VITAMIN D 25 MCG (1000 UNIT) PO TABS
1000.0000 [IU] | ORAL_TABLET | Freq: Every day | ORAL | Status: DC
  Administered 2018-05-01 – 2018-05-03 (×3): 1000 [IU] via ORAL
  Filled 2018-05-01 (×3): qty 1

## 2018-05-01 MED ORDER — SODIUM CHLORIDE (PF) 0.9 % IJ SOLN
INTRAMUSCULAR | Status: AC
  Filled 2018-05-01: qty 50

## 2018-05-01 MED ORDER — ATORVASTATIN CALCIUM 40 MG PO TABS
40.0000 mg | ORAL_TABLET | Freq: Every day | ORAL | Status: DC
  Administered 2018-05-01 – 2018-05-02 (×2): 40 mg via ORAL
  Filled 2018-05-01 (×2): qty 1

## 2018-05-01 MED ORDER — ESCITALOPRAM OXALATE 20 MG PO TABS
20.0000 mg | ORAL_TABLET | Freq: Every day | ORAL | Status: DC
  Administered 2018-05-01 – 2018-05-03 (×3): 20 mg via ORAL
  Filled 2018-05-01 (×3): qty 1

## 2018-05-01 MED ORDER — CYANOCOBALAMIN 500 MCG PO TABS
500.0000 ug | ORAL_TABLET | Freq: Every day | ORAL | Status: DC
  Administered 2018-05-01 – 2018-05-03 (×3): 500 ug via ORAL
  Filled 2018-05-01 (×3): qty 1

## 2018-05-01 MED ORDER — POTASSIUM CHLORIDE 10 MEQ/100ML IV SOLN
10.0000 meq | INTRAVENOUS | Status: AC
  Administered 2018-05-01 (×2): 10 meq via INTRAVENOUS
  Filled 2018-05-01 (×2): qty 100

## 2018-05-01 MED ORDER — LACTATED RINGERS IV BOLUS
1000.0000 mL | Freq: Once | INTRAVENOUS | Status: AC
  Administered 2018-05-01: 1000 mL via INTRAVENOUS

## 2018-05-01 MED ORDER — SODIUM CHLORIDE 0.9 % IV SOLN
2.0000 g | Freq: Three times a day (TID) | INTRAVENOUS | Status: DC
  Administered 2018-05-01 – 2018-05-02 (×3): 2 g via INTRAVENOUS
  Filled 2018-05-01 (×5): qty 2

## 2018-05-01 MED ORDER — IOPAMIDOL (ISOVUE-300) INJECTION 61%
100.0000 mL | Freq: Once | INTRAVENOUS | Status: AC | PRN
  Administered 2018-05-01: 100 mL via INTRAVENOUS

## 2018-05-01 MED ORDER — IOHEXOL 300 MG/ML  SOLN
30.0000 mL | Freq: Once | INTRAMUSCULAR | Status: AC | PRN
  Administered 2018-05-01: 30 mL via ORAL

## 2018-05-01 MED ORDER — DONEPEZIL HCL 5 MG PO TABS
5.0000 mg | ORAL_TABLET | Freq: Every day | ORAL | Status: DC
  Administered 2018-05-01 – 2018-05-02 (×2): 5 mg via ORAL
  Filled 2018-05-01 (×2): qty 1

## 2018-05-01 MED ORDER — SODIUM CHLORIDE 0.9 % IV SOLN
2.0000 g | Freq: Once | INTRAVENOUS | Status: AC
  Administered 2018-05-01: 2 g via INTRAVENOUS
  Filled 2018-05-01: qty 2

## 2018-05-01 MED ORDER — LIDOCAINE HCL 1 % IJ SOLN
INTRAMUSCULAR | Status: AC
  Administered 2018-05-01: 16:00:00
  Filled 2018-05-01: qty 20

## 2018-05-01 MED ORDER — VANCOMYCIN HCL IN DEXTROSE 1-5 GM/200ML-% IV SOLN
1000.0000 mg | Freq: Once | INTRAVENOUS | Status: AC
  Administered 2018-05-01: 1000 mg via INTRAVENOUS
  Filled 2018-05-01: qty 200

## 2018-05-01 MED ORDER — POTASSIUM CHLORIDE CRYS ER 20 MEQ PO TBCR
40.0000 meq | EXTENDED_RELEASE_TABLET | Freq: Once | ORAL | Status: AC
  Administered 2018-05-01: 40 meq via ORAL
  Filled 2018-05-01: qty 2

## 2018-05-01 MED ORDER — VANCOMYCIN HCL 10 G IV SOLR
1250.0000 mg | Freq: Once | INTRAVENOUS | Status: AC
  Administered 2018-05-01: 1250 mg via INTRAVENOUS
  Filled 2018-05-01: qty 1250

## 2018-05-01 MED ORDER — VANCOMYCIN HCL IN DEXTROSE 1-5 GM/200ML-% IV SOLN
1000.0000 mg | Freq: Two times a day (BID) | INTRAVENOUS | Status: DC
  Administered 2018-05-02: 1000 mg via INTRAVENOUS
  Filled 2018-05-01: qty 200

## 2018-05-01 MED ORDER — LISINOPRIL 5 MG PO TABS
5.0000 mg | ORAL_TABLET | Freq: Every day | ORAL | Status: DC
  Administered 2018-05-01 – 2018-05-03 (×3): 5 mg via ORAL
  Filled 2018-05-01 (×3): qty 1

## 2018-05-01 MED ORDER — ACETAMINOPHEN 650 MG RE SUPP: 650.0000 mg | Freq: Four times a day (QID) | RECTAL | Status: DC | PRN

## 2018-05-01 MED ORDER — PANTOPRAZOLE SODIUM 40 MG PO TBEC
40.0000 mg | DELAYED_RELEASE_TABLET | Freq: Every day | ORAL | Status: DC
  Administered 2018-05-02 – 2018-05-03 (×2): 40 mg via ORAL
  Filled 2018-05-01 (×2): qty 1

## 2018-05-01 MED ORDER — LACTATED RINGERS IV SOLN
INTRAVENOUS | Status: DC
  Administered 2018-05-01 – 2018-05-02 (×2): via INTRAVENOUS

## 2018-05-01 MED ORDER — IOPAMIDOL (ISOVUE-300) INJECTION 61%
INTRAVENOUS | Status: AC
  Filled 2018-05-01: qty 100

## 2018-05-01 MED ORDER — ALBUTEROL SULFATE (2.5 MG/3ML) 0.083% IN NEBU: 2.5000 mg | INHALATION_SOLUTION | Freq: Two times a day (BID) | RESPIRATORY_TRACT | Status: DC | PRN

## 2018-05-01 MED ORDER — ACETAMINOPHEN 325 MG PO TABS: 650.0000 mg | ORAL_TABLET | Freq: Four times a day (QID) | ORAL | Status: DC | PRN

## 2018-05-01 MED ORDER — SODIUM CHLORIDE 0.9 % IV BOLUS
500.0000 mL | Freq: Once | INTRAVENOUS | Status: AC
  Administered 2018-05-01: 500 mL via INTRAVENOUS

## 2018-05-01 NOTE — ED Notes (Addendum)
Date and time results received: 05/01/18 12:49 PM  (use smartphrase ".now" to insert current time)  Test: lactic acid  Critical Value: 3.5  Name of Provider Notified: Crystal RN  Orders Received? Or Actions Taken?:

## 2018-05-01 NOTE — ED Provider Notes (Addendum)
[name] DEPT Provider Note   CSN: [id number] Arrival date & time: 05/01/18  5284    History   Chief Complaint Chief Complaint  Patient presents with  . Shortness of Breath    HPI Daniel Bryant is a 58 y.o. male.     HPI  58 year old male presents with acute dyspnea.  He states it started earlier this morning.  Yesterday he was feeling okay.  He was recently discharged for recurrent right pleural effusion and had a thoracentesis.  He states he feels like he needs another 1.  A little bit of a cough and his chest feels tight.  No leg swelling or fevers.  He was placed on oxygen by EMS and given albuterol treatment which she states has partially helped.  He was also given IV Solu-Medrol.  Past Medical History:  Diagnosis Date  . Asthma   . Cancer ([place])   . COPD (chronic obstructive pulmonary disease) ([place])   . GERD (gastroesophageal reflux disease)   . High cholesterol   . History of petit-mal seizures   . Hyperlipidemia   . Hypertension   . Memory impairment   . OSA (obstructive sleep apnea)    Does not tolerate CPAP  . Pacemaker   . PTSD (post-traumatic stress disorder)   . Sick sinus syndrome [facility])     Patient Active Problem List   Diagnosis Date Noted  . Pleural effusion 05/01/2018  . Pacemaker 04/21/2018  . 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 Pleural effusion on right 04/05/2018  . Hypokalemia 04/05/2018  . High cholesterol 04/05/2018  . Hypertension 04/05/2018  . COPD with acute exacerbation ([place]) 04/05/2018  . Polycythemia 04/05/2018  . CAP (community acquired pneumonia) 04/05/2018    Past Surgical History:  Procedure Laterality Date  . CARDIAC SURGERY    . PACEMAKER INSERTION          Home Medications    Prior to Admission medications   Medication Sig Start Date End Date Taking? Authorizing Provider  albuterol (PROVENTIL) (2.5 MG/3ML) 0.083% nebulizer solution Take 3 mLs (2.5 mg total) by nebulization  2 (two) times daily as needed for wheezing or shortness of breath. 04/09/18  Yes Irene Pap N, DO  atorvastatin (LIPITOR) 40 MG tablet Take 40 mg by mouth at bedtime.   Yes [provider]  busPIRone (BUSPAR) 10 MG tablet Take 5 mg by mouth 2 (two) times daily.   Yes [provider]  Cholecalciferol (VITAMIN D3) 25 MCG (1000 UT) CAPS Take 1,000 Units by mouth daily.   Yes [provider]  donepezil (ARICEPT) 5 MG tablet Take 5 mg by mouth at bedtime.   Yes [provider]  escitalopram (LEXAPRO) 20 MG tablet Take 20 mg by mouth daily.    Yes [provider]  lisinopril (PRINIVIL,ZESTRIL) 5 MG tablet Take 5 mg by mouth daily.    Yes [provider]  pantoprazole (PROTONIX) 40 MG tablet Take 1 tablet (40 mg total) by mouth daily. 04/10/18  Yes Irene Pap N, DO  PREDNISONE PO Take by mouth daily.   Yes [provider]  vitamin B-12 (CYANOCOBALAMIN) 500 MCG tablet Take 500 mcg by mouth daily.   Yes [provider]  amoxicillin (AMOXIL) 500 MG tablet Take 1 tablet (500 mg total) by mouth 2 (two) times daily. Patient not taking: Reported on 05/01/2018 04/21/18   Cristal Deer, MD  azithromycin (ZITHROMAX) 250 MG tablet Take 1 tablet (250 mg total) by mouth daily. Patient not  taking: Reported on 05/01/2018 04/21/18   Cristal Deer, MD  nicotine (NICODERM CQ - DOSED IN MG/24 HOURS) 21 mg/24hr patch Place 1 patch (21 mg total) onto the skin daily. Patient not taking: Reported on 05/01/2018 04/10/18   Kayleen Memos, DO    Family History Family History  Problem Relation Age of Onset  . Alzheimer's disease Mother   . Cancer Mother   . Memory loss Father     Social History Social History   Tobacco Use  . Smoking status: Former Smoker    Types: E-cigarettes  . Smokeless tobacco: Current User    Types: Chew  Substance Use Topics  . Alcohol use: Yes    Frequency: Never  . Drug use: No     Allergies   Patient has no  known allergies.   Review of Systems Review of Systems  Constitutional: Negative for fever.  Respiratory: Positive for cough, chest tightness and shortness of breath.   Cardiovascular: Negative for leg swelling.  Gastrointestinal: Negative for vomiting.  All other systems reviewed and are negative.    Physical Exam Updated Vital Signs BP (!) 157/95 (BP Location: Right Arm)   Pulse 98   Temp 98.6 F (37 C) (Oral)   Resp (!) 26   Ht 6' (1.829 m)   Wt 112.5 kg   SpO2 95%   BMI 33.63 kg/m   Physical Exam Vitals signs and nursing note reviewed.  Constitutional:      Appearance: He is well-developed. He is obese. He is diaphoretic.  HENT:     Head: Normocephalic and atraumatic.     Right Ear: External ear normal.     Left Ear: External ear normal.     Nose: Nose normal.  Eyes:     General:        Right eye: No discharge.        Left eye: No discharge.  Neck:     Musculoskeletal: Neck supple.  Cardiovascular:     Rate and Rhythm: Regular rhythm. Tachycardia present.     Heart sounds: Normal heart sounds.  Pulmonary:     Effort: Tachypnea present. No accessory muscle usage.     Breath sounds: Examination of the right-middle field reveals decreased breath sounds. Examination of the right-lower field reveals decreased breath sounds. Examination of the left-lower field reveals decreased breath sounds. Decreased breath sounds and wheezing (mild, expiratory) present. No rales.     Comments: Speaks in short sentences Abdominal:     Palpations: Abdomen is soft.     Tenderness: There is no abdominal tenderness.  Skin:    General: Skin is warm.  Neurological:     Mental Status: He is alert.  Psychiatric:        Mood and Affect: Mood is not anxious.      ED Treatments / Results  Labs (all labs ordered are listed, but only abnormal results are displayed) Labs Reviewed  COMPREHENSIVE METABOLIC PANEL - Abnormal; Notable for the following components:      Result Value    Potassium 2.8 (*)    Glucose, Bld 135 (*)    Calcium 8.5 (*)    All other components within normal limits  CBC WITH DIFFERENTIAL/PLATELET - Abnormal; Notable for the following components:   RBC 6.43 (*)    Hemoglobin 18.6 (*)    HCT 58.1 (*)    Eosinophils Absolute 0.9 (*)    All other components within normal limits  LACTIC ACID, PLASMA - Abnormal; Notable for  the following components:   Lactic Acid, Venous 2.4 (*)    All other components within normal limits  LACTIC ACID, PLASMA - Abnormal; Notable for the following components:   Lactic Acid, Venous 3.5 (*)    All other components within normal limits  LACTIC ACID, PLASMA - Abnormal; Notable for the following components:   Lactic Acid, Venous 5.7 (*)    All other components within normal limits  CULTURE, BLOOD (ROUTINE X 2)  CULTURE, BLOOD (ROUTINE X 2)  MRSA PCR SCREENING  BRAIN NATRIURETIC PEPTIDE  MAGNESIUM  LACTIC ACID, PLASMA  I-STAT TROPONIN, ED    EKG None  Radiology Dg Chest 2 View  Result Date: 05/01/2018 CLINICAL DATA:  Recent diagnosis of lung cancer. Shortness of breath. EXAM: CHEST - 2 VIEW COMPARISON:  April 21, 2018 FINDINGS: There is a significantly larger effusion with underlying opacity in the right lung. Only a small amount right upper lobe is aerated. The left lung remains clear. The cardiomediastinal silhouette is stable. IMPRESSION: 1. Pleural effusion and underlying opacity fill most of the right chest with only a small amount of aerated lung remaining in the upper lobe. Electronically Signed   By: Dorise Bullion III M.D   On: 05/01/2018 11:11    Procedures .Critical Care Performed by: Sherwood Gambler, MD Authorized by: Sherwood Gambler, MD   Critical care provider statement:    Critical care time (minutes):  30   Critical care time was exclusive of:  Separately billable procedures and treating other patients   Critical care was necessary to treat or prevent imminent or life-threatening  deterioration of the following conditions:  Respiratory failure   Critical care was time spent personally by me on the following activities:  Discussions with consultants, evaluation of patient's response to treatment, examination of patient, ordering and performing treatments and interventions, ordering and review of laboratory studies, ordering and review of radiographic studies, pulse oximetry, re-evaluation of patient's condition, obtaining history from patient or surrogate and review of old charts   (including critical care time)  Medications Ordered in ED Medications  atorvastatin (LIPITOR) tablet 40 mg (has no administration in time range)  lisinopril (PRINIVIL,ZESTRIL) tablet 5 mg (has no administration in time range)  donepezil (ARICEPT) tablet 5 mg (has no administration in time range)  escitalopram (LEXAPRO) tablet 20 mg (has no administration in time range)  pantoprazole (PROTONIX) EC tablet 40 mg (has no administration in time range)  vitamin B-12 (CYANOCOBALAMIN) tablet 500 mcg (has no administration in time range)  cholecalciferol (VITAMIN D3) tablet 1,000 Units (has no administration in time range)  albuterol (PROVENTIL) (2.5 MG/3ML) 0.083% nebulizer solution 2.5 mg (has no administration in time range)  acetaminophen (TYLENOL) tablet 650 mg (has no administration in time range)    Or  acetaminophen (TYLENOL) suppository 650 mg (has no administration in time range)  potassium chloride SA (K-DUR,KLOR-CON) CR tablet 40 mEq (has no administration in time range)  lactated ringers infusion ( Intravenous New Bag/Given 05/01/18 1535)  lactated ringers bolus 1,000 mL (has no administration in time range)  lactated ringers bolus 1,000 mL (1,000 mLs Intravenous New Bag/Given 05/01/18 1540)  vancomycin (VANCOCIN) 1,250 mg in sodium chloride 0.9 % 250 mL IVPB (has no administration in time range)  ceFEPIme (MAXIPIME) 2 g in sodium chloride 0.9 % 100 mL IVPB (has no administration in time  range)  lidocaine (XYLOCAINE) 1 % (with pres) injection (has no administration in time range)  potassium chloride SA (K-DUR,KLOR-CON) CR tablet 40 mEq (40  mEq Oral Given 05/01/18 1218)  potassium chloride 10 mEq in 100 mL IVPB (0 mEq Intravenous Stopped 05/01/18 1557)  vancomycin (VANCOCIN) IVPB 1000 mg/200 mL premix (0 mg Intravenous Stopped 05/01/18 1500)  ceFEPIme (MAXIPIME) 2 g in sodium chloride 0.9 % 100 mL IVPB (0 g Intravenous Stopped 05/01/18 1259)  lactated ringers bolus 1,000 mL (0 mLs Intravenous Stopped 05/01/18 1534)     Initial Impression / Assessment and Plan / ED Course  I have reviewed the triage vital signs and the nursing notes.  Pertinent labs & imaging results that were available during my care of the patient were reviewed by me and considered in my medical decision making (see chart for details).        Patient is having increased work of breathing but not in distress.  He is requiring oxygen.  He may need a Pleurx catheter as this is a recurrent issue.  Unclear if there is an infected fluid but probably not.  However with the lactic acid and tachycardia is reasonable to cover for pneumonia.  Dr. Florene Glen will admit.  I have consulted with CT surgery but given the patient is at [facility] long they state that IR can perform this procedure.  The patient's oncologist from the [facility] called me and states they would ask for CT head, chest, abdomen, and pelvis for staging and cancer work-up as he has not been able to have this done because he keeps being in the hospital.  This was passed along to the primary treatment team.  Final Clinical Impressions(s) / ED Diagnoses   Final diagnoses:  Recurrent pleural effusion on right    ED Discharge Orders    None       Sherwood Gambler, MD 05/01/18 1615    Sherwood Gambler, MD 05/28/18 [id number]

## 2018-05-01 NOTE — Progress Notes (Signed)
A consult was received from an ED physician for vancomycin and cefepime per pharmacy dosing (for an indication other than meningitis). The patient's profile has been reviewed for ht/wt/allergies/indication/available labs. A one time order has been placed for the above antibiotics.  Further antibiotics/pharmacy consults should be ordered by admitting physician if indicated.                       Reuel Boom, PharmD, BCPS [phone] 05/01/2018, 11:52 AM

## 2018-05-01 NOTE — H&P (Signed)
History and Physical    Daniel Bryant TMH:[id number] DOB: September 26, 1960 DOA: 05/01/2018  PCP: Patient, No Pcp Per Patient coming from: home  I have personally briefly reviewed patient's old medical records in [place]  Chief Complaint: shortness of breath  HPI: Daniel Bryant is Daniel Bryant 58 y.o. male with medical history significant of metastatic adenocarcinoma, COPD, hypertension, hyperlipidemia, multiple other medical problems presenting with worsening shortness of breath.    Patient notes that he cannot breathe.  He feels like he is filling up with fluid and that his lungs are closing up.  He tried to sleep last night but he could not.  His symptoms started just last night.  He denies any fevers, chills.  He notes some cough.  He denies any chest pain or abdominal pain.  He did have an episode of vomiting yesterday, but no more.   ED Course: Labs, CXR, discussed with oncologist at [facility].  Abx.  Admit for repeat thora.  Review of Systems: As per HPI otherwise 10 point review of systems negative.   Past Medical History:  Diagnosis Date  . Asthma   . Cancer ([place])   . COPD (chronic obstructive pulmonary disease) ([place])   . GERD (gastroesophageal reflux disease)   . High cholesterol   . History of petit-mal seizures   . Hyperlipidemia   . Hypertension   . Memory impairment   . OSA (obstructive sleep apnea)    Does not tolerate CPAP  . Pacemaker   . PTSD (post-traumatic stress disorder)   . Sick sinus syndrome [facility])     Past Surgical History:  Procedure Laterality Date  . CARDIAC SURGERY    . PACEMAKER INSERTION       reports that he has quit smoking. His smoking use included e-cigarettes. His smokeless tobacco use includes chew. He reports current alcohol use. He reports that he does not use drugs.  No Known Allergies  Family History  Problem Relation Age of Onset  . Alzheimer's disease Mother   . Cancer Mother   . Memory loss Father    Prior to Admission  medications   Medication Sig Start Date End Date Taking? Authorizing Provider  albuterol (PROVENTIL) (2.5 MG/3ML) 0.083% nebulizer solution Take 3 mLs (2.5 mg total) by nebulization 2 (two) times daily as needed for wheezing or shortness of breath. 04/09/18  Yes Irene Pap N, DO  atorvastatin (LIPITOR) 40 MG tablet Take 40 mg by mouth at bedtime.   Yes [provider]  busPIRone (BUSPAR) 10 MG tablet Take 5 mg by mouth 2 (two) times daily.   Yes [provider]  Cholecalciferol (VITAMIN D3) 25 MCG (1000 UT) CAPS Take 1,000 Units by mouth daily.   Yes [provider]  donepezil (ARICEPT) 5 MG tablet Take 5 mg by mouth at bedtime.   Yes [provider]  escitalopram (LEXAPRO) 20 MG tablet Take 20 mg by mouth daily.    Yes [provider]  lisinopril (PRINIVIL,ZESTRIL) 5 MG tablet Take 5 mg by mouth daily.    Yes [provider]  pantoprazole (PROTONIX) 40 MG tablet Take 1 tablet (40 mg total) by mouth daily. 04/10/18  Yes Irene Pap N, DO  PREDNISONE PO Take by mouth daily.   Yes [provider]  vitamin B-12 (CYANOCOBALAMIN) 500 MCG tablet Take 500 mcg by mouth daily.   Yes [provider]  amoxicillin (AMOXIL) 500 MG tablet Take 1 tablet (500 mg total) by mouth 2 (two) times daily.  Patient not taking: Reported on 05/01/2018 04/21/18   Cristal Deer, MD  azithromycin (ZITHROMAX) 250 MG tablet Take 1 tablet (250 mg total) by mouth daily. Patient not taking: Reported on 05/01/2018 04/21/18   Cristal Deer, MD  nicotine (NICODERM CQ - DOSED IN MG/24 HOURS) 21 mg/24hr patch Place 1 patch (21 mg total) onto the skin daily. Patient not taking: Reported on 05/01/2018 04/10/18   Kayleen Memos, DO    Physical Exam: Vitals:   05/01/18 1230 05/01/18 1300 05/01/18 1328 05/01/18 1421  BP: (!) 145/96 (!) 157/93  (!) 157/95  Pulse: (!) 103 (!) 101 99 98  Resp: (!) 21 (!) 21 (!) 24 (!) 26  Temp:    98.6 F (37 C)  TempSrc:    Oral    SpO2: 94% 95% 94% 95%  Weight:      Height:        Constitutional: NAD, calm, comfortable Vitals:   05/01/18 1230 05/01/18 1300 05/01/18 1328 05/01/18 1421  BP: (!) 145/96 (!) 157/93  (!) 157/95  Pulse: (!) 103 (!) 101 99 98  Resp: (!) 21 (!) 21 (!) 24 (!) 26  Temp:    98.6 F (37 C)  TempSrc:    Oral  SpO2: 94% 95% 94% 95%  Weight:      Height:       Eyes: PERRL, lids and conjunctivae normal ENMT: Mucous membranes are moist. Posterior pharynx clear of any exudate or lesions.Normal dentition.  Neck: normal, supple, no masses, no thyromegaly Respiratory: decreased lung sounds to R side.  Tachypneic.   Cardiovascular: tachycardic rate and regular rhythm, no murmurs / rubs / gallops. No extremity edema.   Abdomen: no tenderness, no masses palpated. No hepatosplenomegaly. Bowel sounds positive.  Musculoskeletal: no clubbing / cyanosis. No joint deformity upper and lower extremities. Good ROM, no contractures. Normal muscle tone.  Skin: no rashes, lesions, ulcers. No induration Neurologic: CN 2-12 grossly intact. Sensation intact. Psychiatric: Normal judgment and insight. Alert and oriented x 3. Normal mood.   Labs on Admission: I have personally reviewed following labs and imaging studies  CBC: Recent Labs  Lab 05/01/18 1016  WBC 9.5  NEUTROABS 6.3  HGB 18.6*  HCT 58.1*  MCV 90.4  PLT 403   Basic Metabolic Panel: Recent Labs  Lab 05/01/18 1016  NA 139  K 2.8*  CL 103  CO2 24  GLUCOSE 135*  BUN 10  CREATININE 1.04  CALCIUM 8.5*  MG 1.8   GFR: Estimated Creatinine Clearance: 97.9 mL/min (by C-G formula based on SCr of 1.04 mg/dL). Liver Function Tests: Recent Labs  Lab 05/01/18 1016  AST 25  ALT 30  ALKPHOS 107  BILITOT 0.7  PROT 6.8  ALBUMIN 3.9   No results for input(s): LIPASE, AMYLASE in the last 168 hours. No results for input(s): AMMONIA in the last 168 hours. Coagulation Profile: No results for input(s): INR, PROTIME in the last 168  hours. Cardiac Enzymes: No results for input(s): CKTOTAL, CKMB, CKMBINDEX, TROPONINI in the last 168 hours. BNP (last 3 results) No results for input(s): PROBNP in the last 8760 hours. HbA1C: No results for input(s): HGBA1C in the last 72 hours. CBG: No results for input(s): GLUCAP in the last 168 hours. Lipid Profile: No results for input(s): CHOL, HDL, LDLCALC, TRIG, CHOLHDL, LDLDIRECT in the last 72 hours. Thyroid Function Tests: No results for input(s): TSH, T4TOTAL, FREET4, T3FREE, THYROIDAB in the last 72 hours. Anemia Panel: No results for input(s): VITAMINB12, FOLATE, FERRITIN, TIBC,  IRON, RETICCTPCT in the last 72 hours. Urine analysis: No results found for: COLORURINE, APPEARANCEUR, LABSPEC, [place], GLUCOSEU, HGBUR, BILIRUBINUR, KETONESUR, PROTEINUR, UROBILINOGEN, NITRITE, LEUKOCYTESUR  Radiological Exams on Admission: Dg Chest 2 View  Result Date: 05/01/2018 CLINICAL DATA:  Recent diagnosis of lung cancer. Shortness of breath. EXAM: CHEST - 2 VIEW COMPARISON:  April 21, 2018 FINDINGS: There is Daniel Bryant significantly larger effusion with underlying opacity in the right lung. Only Daniel Bryant small amount right upper lobe is aerated. The left lung remains clear. The cardiomediastinal silhouette is stable. IMPRESSION: 1. Pleural effusion and underlying opacity fill most of the right chest with only Daniel Bryant small amount of aerated lung remaining in the upper lobe. Electronically Signed   By: Dorise Bullion III M.D   On: 05/01/2018 11:11   Dg Chest Port 1 View  Result Date: 05/01/2018 CLINICAL DATA:  S/p thoracentesis , history of lung carcinoma EXAM: PORTABLE CHEST - 1 VIEW COMPARISON:  Earlier film of the same day FINDINGS: No pneumothorax. Moderate residual opacity in the lower right hemithorax probably combination of effusion and lower lung consolidation/atelectasis. Improved aeration of the right upper lung. Left lung clear. Heart size of part to assess due to adjacent opacity. Stable right  subclavian dual lead pacemaker. Visualized bones unremarkable. IMPRESSION: No pneumothorax post right thoracentesis. Electronically Signed   By: Lucrezia Europe M.D.   On: 05/01/2018 17:19    EKG: Independently reviewed. Sinus tach, appears similar to priors  Assessment/Plan Active Problems:   Pleural effusion   Recurrent Malignant Pleural Effusion  shortness of breath:  Cytology from 1/23 with malignant cells c/w metastatic adenocarcinoma.  Findings c/w primary lung adenocarcinoma.  Admitted 1/23 and 2/8 for similar issues.   Plan for thora with cell count/culture Blood cultures Follow up CT chest/abdomen/pelvis.  Will also obtain CT head per request from [facility] oncologist. Consider discussion with oncology tomorrow Consider pleurx given his recurrent pleural effusions Currently covering with broad spectrum abx  Sounds like he's planning on follow up with oncology at [facility], but he hasn't established yet  Lactic Acidosis: he doesn't appear septic, but does have increased wob.  Apparently he did receive nebs, which could contribute to this.  Lactate increased when he got to floor.  Reevaluated and pt appeared similar to before, tachypneic, but stable.  Follow with boluses and after thora.  Follow on abx.         Hypokalemia: replace and follow  COPD: albuterol prn, he says he's no longer taking prednisone  Hypertension: lisinopril  Anxiety  Depression: continue lexapro, he's no longer taking buspar  GERD: continue PPI  SSS s/p pacemaker placement  Continue donepezil  DVT prophylaxis: scd  Code Status: full  Family Communication: none at bedside, doesn't sound like he has much family  Disposition Plan: pending  Consults called: none Admission status: observation    Fayrene Helper MD [facility] Pager AMION  If 7PM-7AM, please contact night-coverage [email or web address] Password [facility]  05/01/2018, 5:24 PM

## 2018-05-01 NOTE — Progress Notes (Signed)
Pharmacy Antibiotic Note  Daniel Bryant is a 58 y.o. male admitted on 05/01/2018 with pneumonia.  Pharmacy has been consulted for vancomycin and cefepime dosing. Vanc 1 gm given at 1310 and cefepime 2 gm given at 1218  Plan: give vanc 1250 mg in addition to 1 gm for total of 2250 gm Loading doe Vancomycin 1000 mg IV Q 12 hrs. Goal AUC 400-550. Expected AUC: 485.7 SCr used: 1.04, IBW/TBW  Vd 0.5 Cefepime 2 gm IV q8 F/u renal fxn, WBC, temp, culture data   Height: 6' (182.9 cm) Weight: 248 lb (112.5 kg) IBW/kg (Calculated) : 77.6  Temp (24hrs), Avg:98.4 F (36.9 C), Min:98.1 F (36.7 C), Max:98.6 F (37 C)  Recent Labs  Lab 05/01/18 1016 05/01/18 1211 05/01/18 1441  WBC 9.5  --   --   CREATININE 1.04  --   --   LATICACIDVEN 2.4* 3.5* 5.7*    Estimated Creatinine Clearance: 97.9 mL/min (by C-G formula based on SCr of 1.04 mg/dL).    No Known Allergies  Antimicrobials this admission: 2/18 vanc>> 2/18 cefepime>>  Dose adjustments this admission:  Microbiology results: 2/18 BCx2>> 2/8 R pleural fluid>>NF 2/18 MRSA PCR>>  Thank you for allowing pharmacy to be a part of this patient's care.  Eudelia Bunch, Pharm.D [phone] 05/01/2018 4:06 PM

## 2018-05-01 NOTE — ED Triage Notes (Signed)
Pt arrives via [facility] from home. Pt reports [facility] beginning just PTA. Pt diagnosed with Lung cancer recently (04/19/2018). EMS administered 2 Duonebs, 125mg  Solu-medrol IV. Pt had thoracentesis last week.

## 2018-05-01 NOTE — ED Notes (Signed)
Bed: TT01 Expected date:  Expected time:  Means of arrival:  Comments: EMS 58yo [facility] asthma, lung CA, duoneb x2, solumedrol

## 2018-05-01 NOTE — Procedures (Signed)
  Procedure: [place] R thoracentesis 2L yellow EBL:   minimal Complications:  none immediate  See full dictation in [facility].  Dillard Cannon MD Main # [phone] Pager  [phone]

## 2018-05-01 NOTE — ED Notes (Signed)
Attempted to call report to 1410. Per Ander Purpura RN. Pt may not be appropriate for Telemetry unit. RN To call admitting MD to reassess level of care. RN to call back

## 2018-05-01 NOTE — ED Notes (Signed)
ED TO INPATIENT HANDOFF REPORT  Name/Age/Gender Daniel Bryant 58 y.o. male  Code Status Code Status History    Date Active Date Inactive Code Status Order ID Comments User Context   04/20/2018 1620 04/21/2018 2016 Full Code [id number]  Caren Griffins, MD Inpatient   04/05/2018 1835 04/09/2018 2124 Full Code [id number]  Reubin Milan, MD Inpatient   04/05/2018 1239 04/05/2018 [place] Full Code [id number]  Reubin Milan, MD ED      Home/SNF/Other Home  Chief Complaint SOB  Level of Care/Admitting Diagnosis ED Disposition    ED Disposition Condition [place]: [place] [100102]  Level of Care: Telemetry [5]  Admit to tele based on following criteria: Other see comments  Comments: shortness of breath  Diagnosis: Pleural effusion [242230]  Admitting Physician: Elodia Florence [id number]  Attending Physician: Cephus Slater, A CALDWELL [phone]  PT Class (Do Not Modify): Observation [104]  PT Acc Code (Do Not Modify): Observation [10022]       Medical History Past Medical History:  Diagnosis Date  . Asthma   . Cancer ([place])   . COPD (chronic obstructive pulmonary disease) ([place])   . GERD (gastroesophageal reflux disease)   . High cholesterol   . History of petit-mal seizures   . Hyperlipidemia   . Hypertension   . Memory impairment   . OSA (obstructive sleep apnea)    Does not tolerate CPAP  . Pacemaker   . PTSD (post-traumatic stress disorder)   . Sick sinus syndrome ([place])     Allergies No Known Allergies  IV Location/Drains/Wounds Patient Lines/Drains/Airways Status   Active Line/Drains/Airways    Name:   Placement date:   Placement time:   Site:   Days:   Peripheral IV 05/01/18 Right Hand   05/01/18    -    Hand   less than 1   Peripheral IV 05/01/18 Left Antecubital   05/01/18    1016    Antecubital   less than 1          Labs/Imaging Results for orders placed or performed during the hospital encounter  of 05/01/18 (from the past 48 hour(s))  Comprehensive metabolic panel     Status: Abnormal   Collection Time: 05/01/18 10:16 AM  Result Value Ref Range   Sodium 139 135 - 145 mmol/L   Potassium 2.8 (L) 3.5 - 5.1 mmol/L   Chloride 103 98 - 111 mmol/L   CO2 24 22 - 32 mmol/L   Glucose, Bld 135 (H) 70 - 99 mg/dL   BUN 10 6 - 20 mg/dL   Creatinine, Ser 1.04 0.61 - 1.24 mg/dL   Calcium 8.5 (L) 8.9 - 10.3 mg/dL   Total Protein 6.8 6.5 - 8.1 g/dL   Albumin 3.9 3.5 - 5.0 g/dL   AST 25 15 - 41 U/L   ALT 30 0 - 44 U/L   Alkaline Phosphatase 107 38 - 126 U/L   Total Bilirubin 0.7 0.3 - 1.2 mg/dL   GFR calc non Af Amer >60 >60 mL/min   GFR calc Af Amer >60 >60 mL/min   Anion gap 12 5 - 15    Comment: Performed at [facility], [place] [address], [place], [place] [address]  CBC with Differential     Status: Abnormal   Collection Time: 05/01/18 10:16 AM  Result Value Ref Range   WBC 9.5 4.0 - 10.5 K/uL   RBC 6.43 (H) 4.22 -  5.81 MIL/uL   Hemoglobin 18.6 (H) 13.0 - 17.0 g/dL   HCT 58.1 (H) 39.0 - 52.0 %   MCV 90.4 80.0 - 100.0 fL   MCH 28.9 26.0 - 34.0 pg   MCHC 32.0 30.0 - 36.0 g/dL   RDW 14.2 11.5 - 15.5 %   Platelets 165 150 - 400 K/uL   nRBC 0.0 0.0 - 0.2 %   Neutrophils Relative % 66 %   Neutro Abs 6.3 1.7 - 7.7 K/uL   Lymphocytes Relative 19 %   Lymphs Abs 1.8 0.7 - 4.0 K/uL   Monocytes Relative 4 %   Monocytes Absolute 0.4 0.1 - 1.0 K/uL   Eosinophils Relative 9 %   Eosinophils Absolute 0.9 (H) 0.0 - 0.5 K/uL   Basophils Relative 1 %   Basophils Absolute 0.1 0.0 - 0.1 K/uL   Immature Granulocytes 1 %   Abs Immature Granulocytes 0.05 0.00 - 0.07 K/uL    Comment: Performed at [facility], [place] [address], [place], [place] [address]  Brain natriuretic peptide     Status: None   Collection Time: 05/01/18 10:16 AM  Result Value Ref Range   B Natriuretic Peptide 51.2 0.0 - 100.0 pg/mL    Comment: Performed at [facility],  [place] [address] [place], [place] [address]  Lactic acid, plasma     Status: Abnormal   Collection Time: 05/01/18 10:16 AM  Result Value Ref Range   Lactic Acid, Venous 2.4 (HH) 0.5 - 1.9 mmol/L    Comment: CRITICAL RESULT CALLED TO, READ BACK BY AND VERIFIED WITH: HALL,C. RN AT 1046 05/01/18 MULLINS,T Performed at [facility], [place] [address] [place], [place] [address]   Magnesium     Status: None   Collection Time: 05/01/18 10:16 AM  Result Value Ref Range   Magnesium 1.8 1.7 - 2.4 mg/dL    Comment: Performed at [facility], [place] [address], [place], [place] [address]  I-stat troponin, ED     Status: None   Collection Time: 05/01/18 10:22 AM  Result Value Ref Range   Troponin i, poc 0.02 0.00 - 0.08 ng/mL   Comment 3            Comment: Due to the release kinetics of cTnI, a negative result within the first hours of the onset of symptoms does not rule out myocardial infarction with certainty. If myocardial infarction is still suspected, repeat the test at appropriate intervals.   Lactic acid, plasma     Status: Abnormal   Collection Time: 05/01/18 12:11 PM  Result Value Ref Range   Lactic Acid, Venous 3.5 (HH) 0.5 - 1.9 mmol/L    Comment: CRITICAL RESULT CALLED TO, READ BACK BY AND VERIFIED WITH: HALL,C. RN AT 1248 05/01/18 MULLINS,T Performed at [facility], [place] [address] [place], [place] [address]    Dg Chest 2 View  Result Date: 05/01/2018 CLINICAL DATA:  Recent diagnosis of lung cancer. Shortness of breath. EXAM: CHEST - 2 VIEW COMPARISON:  April 21, 2018 FINDINGS: There is a significantly larger effusion with underlying opacity in the right lung. Only a small amount right upper lobe is aerated. The left lung remains clear. The cardiomediastinal silhouette is stable. IMPRESSION: 1. Pleural effusion and underlying opacity fill most of the right chest with only a small amount of aerated lung remaining in the upper  lobe. Electronically Signed   By: Dorise Bullion III M.D   On: 05/01/2018  11:11    Pending Labs Unresulted Labs (From admission, onward)    Start     Ordered   05/01/18 1303  Lactic acid, plasma  Now then every 2 hours,   STAT     05/01/18 1302   05/01/18 0952  Culture, blood (routine x 2)  BLOOD CULTURE X 2,   STAT     05/01/18 0951   Signed and Held  Comprehensive metabolic panel  Tomorrow morning,   R     Signed and Held   Signed and Held  CBC  Tomorrow morning,   R     Signed and Held          Vitals/Pain Today's Vitals   05/01/18 1000 05/01/18 1230 05/01/18 1300 05/01/18 1328  BP: (!) 149/114 (!) 145/96 (!) 157/93   Pulse: (!) 110 (!) 103 (!) 101 99  Resp: (!) 22 (!) 21 (!) 21 (!) 24  Temp:      TempSrc:      SpO2: 95% 94% 95% 94%  Weight:      Height:      PainSc:        Isolation Precautions No active isolations  Medications Medications  potassium chloride 10 mEq in 100 mL IVPB (0 mEq Intravenous Stopped 05/01/18 1341)  vancomycin (VANCOCIN) IVPB 1000 mg/200 mL premix (1,000 mg Intravenous New Bag/Given 05/01/18 1310)  potassium chloride SA (K-DUR,KLOR-CON) CR tablet 40 mEq (has no administration in time range)  lactated ringers infusion (has no administration in time range)  lactated ringers bolus 1,000 mL (1,000 mLs Intravenous New Bag/Given 05/01/18 1310)  potassium chloride SA (K-DUR,KLOR-CON) CR tablet 40 mEq (40 mEq Oral Given 05/01/18 1218)  ceFEPIme (MAXIPIME) 2 g in sodium chloride 0.9 % 100 mL IVPB (0 g Intravenous Stopped 05/01/18 1259)    Mobility walks

## 2018-05-01 NOTE — ED Notes (Signed)
Patient transported to X-ray 

## 2018-05-01 NOTE — ED Notes (Signed)
Date and time results received: 05/01/18 10:47 AM (use smartphrase ".now" to insert current time)  Test: lactic acid  Critical Value: 2.4  Name of Provider Notified: Crystal RN  Orders Received? Or Actions Taken?:

## 2018-05-01 NOTE — Progress Notes (Signed)
Lab called with a critical lactic acid of 5.7. I have paged the Dr. Florene Glen to notify him.

## 2018-05-01 NOTE — ED Notes (Signed)
Date and time results received: 05/01/18 1:03 PM  Test: Lactic Acid Critical Value: 3.5  Name of Provider Notified: Florene Glen MD  Orders Received? Or Actions Taken?: Orders Received - See Orders for details

## 2018-05-02 DIAGNOSIS — J449 Chronic obstructive pulmonary disease, unspecified: Secondary | ICD-10-CM | POA: Diagnosis present

## 2018-05-02 DIAGNOSIS — C3491 Malignant neoplasm of unspecified part of right bronchus or lung: Principal | ICD-10-CM

## 2018-05-02 DIAGNOSIS — J910 Malignant pleural effusion: Secondary | ICD-10-CM | POA: Diagnosis present

## 2018-05-02 DIAGNOSIS — Z79899 Other long term (current) drug therapy: Secondary | ICD-10-CM | POA: Diagnosis not present

## 2018-05-02 DIAGNOSIS — R0602 Shortness of breath: Secondary | ICD-10-CM | POA: Diagnosis present

## 2018-05-02 DIAGNOSIS — E876 Hypokalemia: Secondary | ICD-10-CM | POA: Diagnosis present

## 2018-05-02 DIAGNOSIS — Z87891 Personal history of nicotine dependence: Secondary | ICD-10-CM | POA: Diagnosis not present

## 2018-05-02 DIAGNOSIS — K219 Gastro-esophageal reflux disease without esophagitis: Secondary | ICD-10-CM | POA: Diagnosis present

## 2018-05-02 DIAGNOSIS — C771 Secondary and unspecified malignant neoplasm of intrathoracic lymph nodes: Secondary | ICD-10-CM | POA: Diagnosis present

## 2018-05-02 DIAGNOSIS — F4310 Post-traumatic stress disorder, unspecified: Secondary | ICD-10-CM | POA: Diagnosis present

## 2018-05-02 DIAGNOSIS — E872 Acidosis: Secondary | ICD-10-CM | POA: Diagnosis present

## 2018-05-02 DIAGNOSIS — Z950 Presence of cardiac pacemaker: Secondary | ICD-10-CM | POA: Diagnosis not present

## 2018-05-02 DIAGNOSIS — Z809 Family history of malignant neoplasm, unspecified: Secondary | ICD-10-CM | POA: Diagnosis not present

## 2018-05-02 DIAGNOSIS — J9601 Acute respiratory failure with hypoxia: Secondary | ICD-10-CM | POA: Diagnosis not present

## 2018-05-02 DIAGNOSIS — F419 Anxiety disorder, unspecified: Secondary | ICD-10-CM | POA: Diagnosis present

## 2018-05-02 DIAGNOSIS — J90 Pleural effusion, not elsewhere classified: Secondary | ICD-10-CM | POA: Diagnosis present

## 2018-05-02 DIAGNOSIS — J9600 Acute respiratory failure, unspecified whether with hypoxia or hypercapnia: Secondary | ICD-10-CM | POA: Diagnosis present

## 2018-05-02 DIAGNOSIS — Z7952 Long term (current) use of systemic steroids: Secondary | ICD-10-CM | POA: Diagnosis not present

## 2018-05-02 DIAGNOSIS — I10 Essential (primary) hypertension: Secondary | ICD-10-CM | POA: Diagnosis present

## 2018-05-02 DIAGNOSIS — I495 Sick sinus syndrome: Secondary | ICD-10-CM | POA: Diagnosis present

## 2018-05-02 DIAGNOSIS — F329 Major depressive disorder, single episode, unspecified: Secondary | ICD-10-CM | POA: Diagnosis present

## 2018-05-02 DIAGNOSIS — G4733 Obstructive sleep apnea (adult) (pediatric): Secondary | ICD-10-CM | POA: Diagnosis present

## 2018-05-02 DIAGNOSIS — E785 Hyperlipidemia, unspecified: Secondary | ICD-10-CM | POA: Diagnosis present

## 2018-05-02 LAB — COMPREHENSIVE METABOLIC PANEL
ALT: 24 U/L (ref 0–44)
AST: 27 U/L (ref 15–41)
Albumin: 3.5 g/dL (ref 3.5–5.0)
Alkaline Phosphatase: 83 U/L (ref 38–126)
Anion gap: 7 (ref 5–15)
BUN: 9 mg/dL (ref 6–20)
CALCIUM: 8.6 mg/dL — AB (ref 8.9–10.3)
CO2: 23 mmol/L (ref 22–32)
CREATININE: 0.86 mg/dL (ref 0.61–1.24)
Chloride: 107 mmol/L (ref 98–111)
GFR calc non Af Amer: >60 mL/min (ref >60)
Glucose, Bld: 134 mg/dL — ABNORMAL HIGH (ref 70–99)
Potassium: 4.8 mmol/L (ref 3.5–5.1)
Sodium: 137 mmol/L (ref 135–145)
Total Bilirubin: 1.3 mg/dL — ABNORMAL HIGH (ref 0.3–1.2)
Total Protein: 6.2 g/dL — ABNORMAL LOW (ref 6.5–8.1)

## 2018-05-02 LAB — CBC
HCT: 48.5 % (ref 39.0–52.0)
Hemoglobin: 15.5 g/dL (ref 13.0–17.0)
MCH: 29 pg (ref 26.0–34.0)
MCHC: 32 g/dL (ref 30.0–36.0)
MCV: 90.8 fL (ref 80.0–100.0)
NRBC: 0 % (ref 0.0–0.2)
PLATELETS: 178 10*3/uL (ref 150–400)
RBC: 5.34 MIL/uL (ref 4.22–5.81)
RDW: 14.2 % (ref 11.5–15.5)
WBC: 16.4 10*3/uL — ABNORMAL HIGH (ref 4.0–10.5)

## 2018-05-02 LAB — LACTIC ACID, PLASMA
Lactic Acid, Venous: 3 mmol/L (ref 0.5–1.9)
Lactic Acid, Venous: 1.9 mmol/L (ref 0.5–1.9)

## 2018-05-02 MED ORDER — SODIUM CHLORIDE 0.9 % IV BOLUS
500.0000 mL | Freq: Once | INTRAVENOUS | Status: AC
  Administered 2018-05-02: 500 mL via INTRAVENOUS

## 2018-05-02 NOTE — Progress Notes (Addendum)
  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  Underwent urgent thoracentesis with removal of 2 L pleural fluid with rapid clinical improvement.  A & P  Recurrent malignant right pleural effusion secondary to right lung adenocarcinoma --Discussed with his oncologist at the [facility] by telephone today, requested CT abdomen, pelvis, chest and head for staging (all showed no evidence of metastasis).  Oncologist recommended placing Pleurx catheter as the patient's now had 3 thoracenteses in less than 1 month --No evidence of infection at this point will discontinue --Significant lactic acid. Resolved most likely secondary to acute respiratory distress  COPD --Appears quiescent.  Follow clinically.  PMH pacemaker placement   Overall improved.  Patient now amenable to Pleurx catheter placement, will consult IR to see if he can have one placed 2/20.  Patient plans to follow-up with oncologist at the [facility] as an outpatient.  DVT prophylaxis: SCDs Code Status: Full Family Communication: none Disposition Plan: home    Murray Hodgkins, MD  [facility] Direct contact: see [email or web address]  7PM-7AM contact night coverage as above 05/02/2018, 7:27 PM  LOS: 0 days   Consultants  . IR  Procedures  . 2/18 large volume right thoracentesis removal 2 L  Antibiotics  .   Interval History/Subjective  Feels well, breathing well, no complaints at this time.  Objective   Vitals:  Vitals:   05/02/18 0738 05/02/18 1342  BP: (!) 167/101 (!) 160/89  Pulse: 97 79  Resp: (!) 21 20  Temp: 98 F (36.7 C) 98.3 F (36.8 C)  SpO2: 96% 95%    Exam:  Constitutional:  . Appears calm and comfortable Respiratory:  . CTA bilaterally,  no w/r/r.  . Respiratory effort normal.  Cardiovascular:  . RRR, no m/r/g . No LE extremity edema   Psychiatric:  . Mental status o Mood, affect appropriate   I have personally reviewed the following:   Today's Data  . Complete metabolic panel unremarkable . Lactic acid now within normal limits . WBC 16.4, remainder CBC unremarkable  Lab Data  .   Micro Data  . Pleural fluid culture pending, Gram stain negative, fluid analysis unremarkable  Imaging  . 2/18 chest x-ray postthoracentesis shows residual pleural effusion . CT head no acute disease . CT abdomen pelvis no findings of metastatic disease . CT chest showed right-sided pleural and thoracic nodal metastasis  Cardiology Data  . EKG showed sinus tachycardia  Other Data  .   Scheduled Meds: . atorvastatin  40 mg Oral QHS  . cholecalciferol  1,000 Units Oral Daily  . donepezil  5 mg Oral QHS  . escitalopram  20 mg Oral Daily  . lisinopril  5 mg Oral Daily  . pantoprazole  40 mg Oral Daily  . vitamin B-12  500 mcg Oral Daily   Continuous Infusions:   Principal Problem:   Malignant pleural effusion Active Problems:   Pacemaker   Recurrent pleural effusion on right   Adenocarcinoma of right lung (HCC)   COPD (chronic obstructive pulmonary disease) (HCC)   LOS: 0 days      Greater than 35 minutes spent, greater than 50% counseling, coordination of care

## 2018-05-03 ENCOUNTER — Inpatient Hospital Stay (HOSPITAL_COMMUNITY): Payer: No Typology Code available for payment source

## 2018-05-03 DIAGNOSIS — J9601 Acute respiratory failure with hypoxia: Secondary | ICD-10-CM

## 2018-05-03 LAB — CBC WITH DIFFERENTIAL/PLATELET
Abs Immature Granulocytes: 0.06 10*3/uL (ref 0.00–0.07)
Basophils Absolute: 0.1 10*3/uL (ref 0.0–0.1)
Basophils Relative: 1 %
Eosinophils Absolute: 0.6 10*3/uL — ABNORMAL HIGH (ref 0.0–0.5)
Eosinophils Relative: 5 %
HCT: 49.9 % (ref 39.0–52.0)
HEMOGLOBIN: 15.8 g/dL (ref 13.0–17.0)
Immature Granulocytes: 1 %
Lymphocytes Relative: 18 %
Lymphs Abs: 1.9 10*3/uL (ref 0.7–4.0)
MCH: 29.3 pg (ref 26.0–34.0)
MCHC: 31.7 g/dL (ref 30.0–36.0)
MCV: 92.6 fL (ref 80.0–100.0)
Monocytes Absolute: 0.7 10*3/uL (ref 0.1–1.0)
Monocytes Relative: 7 %
NEUTROS ABS: 7.5 10*3/uL (ref 1.7–7.7)
NEUTROS PCT: 68 %
NRBC: 0 % (ref 0.0–0.2)
Platelets: 146 10*3/uL — ABNORMAL LOW (ref 150–400)
RBC: 5.39 MIL/uL (ref 4.22–5.81)
RDW: 14.4 % (ref 11.5–15.5)
WBC: 10.8 10*3/uL — ABNORMAL HIGH (ref 4.0–10.5)

## 2018-05-03 LAB — PROTIME-INR
INR: 1.01
Prothrombin Time: 13.2 seconds (ref 11.4–15.2)

## 2018-05-03 NOTE — Discharge Summary (Signed)
Physician Discharge Summary  Daniel Bryant FXT:[id number] DOB: 1961-02-14 DOA: 05/01/2018  PCP: Patient, No Pcp Per  Chinita Pester, MD, oncology fellow at [facility] date: 05/01/2018 Discharge date: 05/03/2018  Recommendations for Outpatient Follow-up:  1. Follow-up recurrent malignant pleural effusion, see discussion below  Follow-up Information    Clinic, [facility]. Go on 05/04/2018.   Why:  @ 11a-imaging clinic-Dr. Devin Going information: [place] [address] [phone]            Discharge Diagnoses: Principal diagnosis is #1 1. 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 Acute respiratory failure without hypoxia 3. COPD  Discharge Condition: improved Disposition: home  Diet recommendation: regular  Filed Weights   05/01/18 0924 05/02/18 0646 05/03/18 0715  Weight: 112.5 kg 115.5 kg 113.4 kg    History of present illness:  58 year old man recently diagnosed with adenocarcinoma, presumed right lung by previous thoracentesis, presented with recurrent malignant right pleural effusion with acute respiratory failure.   Hospital Course:  Patient underwent urgent thoracentesis with removal of 2 L pleural fluid with rapid clinical improvement.  Subsequent chest x-ray did not demonstrate rapid reaccumulation.  Pleurx catheter was recommended but ultimately the patient declined to have this procedure placed.  He was discharged in good condition to follow-up with his oncologist this afternoon.  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  Acute respiratory failure without hypoxia, resolved. --Discussed with his oncologist at the [facility] by telephone 2/19, requested CT abdomen, pelvis, chest and head for staging (all showed no evidence of metastasis).  Oncologist recommended placing Pleurx catheter as the patient's now had 3 thoracenteses in less  than 1 month.  This was arranged and the patient initially agreed 2/19 but subsequently refused 2/20.  I updated the patient's oncologist by telephone. --Patient appears clinically stable for discharge, he has follow-up with his oncologist this afternoon.  COPD --Stable.  PMH pacemaker placement  Consultants   IR  Procedures   2/18 large volume right thoracentesis removal 2 L  Today's assessment: S: Patient feels well, breathing well.  He does not want to pursue Pleurx catheter placement at this time.  He plans to follow-up with his oncologist this afternoon. O: Vitals:  Vitals:   05/03/18 0547 05/03/18 1024  BP: (!) 142/83 140/78  Pulse: 81   Resp: 16   Temp: 97.6 F (36.4 C)   SpO2: 96%     Constitutional:  . Appears calm and comfortable Respiratory:  . CTA bilaterally, no w/r/r.  . Respiratory effort normal.  Cardiovascular:  . RRR, no m/r/g . No LE extremity edema   Psychiatric:  . Mental status o Mood, affect appropriate  Chest x-ray independently reviewed showed decreased right-sided pleural effusion compared to previous.  I have reviewed the imaging with the patient at bedside.  CBC unremarkable.  Discharge Instructions  Discharge Instructions    Diet general   Complete by:  As directed    Discharge instructions   Complete by:  As directed    Call your physician or seek immediate medical attention for shortness of breath, fever or worsening of condition.   Increase activity slowly   Complete by:  As directed      Allergies as of 05/03/2018   No Known Allergies     Medication List    STOP taking these medications   amoxicillin 500 MG tablet Commonly known as:  AMOXIL   azithromycin 250 MG tablet Commonly  known as:  ZITHROMAX   PREDNISONE PO     TAKE these medications   albuterol (2.5 MG/3ML) 0.083% nebulizer solution Commonly known as:  PROVENTIL Take 3 mLs (2.5 mg total) by nebulization 2 (two) times daily as needed for wheezing or  shortness of breath.   atorvastatin 40 MG tablet Commonly known as:  LIPITOR Take 40 mg by mouth at bedtime. Notes to patient:  Please take this medication tonight 05/03/18 at beditime   busPIRone 10 MG tablet Commonly known as:  BUSPAR Take 5 mg by mouth 2 (two) times daily.   donepezil 5 MG tablet Commonly known as:  ARICEPT Take 5 mg by mouth at bedtime. Notes to patient:  Please take this Medication tonight at bedtime 05/03/2018   escitalopram 20 MG tablet Commonly known as:  LEXAPRO Take 20 mg by mouth daily. Notes to patient:  Last Dose of this medication was given on 05/03/2018 at 10:24 am   lisinopril 5 MG tablet Commonly known as:  PRINIVIL,ZESTRIL Take 5 mg by mouth daily. Notes to patient:  Last Dose of this medication was given on 05/03/2018 at 10:24am   nicotine 21 mg/24hr patch Commonly known as:  NICODERM CQ - dosed in mg/24 hours Place 1 patch (21 mg total) onto the skin daily.   pantoprazole 40 MG tablet Commonly known as:  PROTONIX Take 1 tablet (40 mg total) by mouth daily. Notes to patient:  Last Dose of this Medication was given on 05/03/2018 at 10:24 am   vitamin B-12 500 MCG tablet Commonly known as:  CYANOCOBALAMIN Take 500 mcg by mouth daily. Notes to patient:  Last Dose of this Medication was given on 05/03/2018 at 10:24 am   Vitamin D3 25 MCG (1000 UT) Caps Take 1,000 Units by mouth daily. Notes to patient:  Last Dose of this Medication was given on 05/03/2018 at 10:24 am      No Known Allergies  The results of significant diagnostics from this hospitalization (including imaging, microbiology, ancillary and laboratory) are listed below for reference.    Significant Diagnostic Studies: Dg Chest 1 View  Result Date: 04/21/2018 CLINICAL DATA:  Status post thoracentesis. EXAM: CHEST  1 VIEW COMPARISON:  Chest radiograph 04/20/2018 FINDINGS: Multi lead pacer apparatus overlies the right hemithorax. Stable cardiac and mediastinal contours. Interval  decrease in size of now small to moderate right pleural effusion with underlying opacities. No pneumothorax. Left lung is clear. IMPRESSION: Interval decrease in size of now small to moderate right pleural effusion with underlying opacities. Electronically Signed   By: Lovey Newcomer M.D.   On: 04/21/2018 16:27   Dg Chest 1 View  Result Date: 04/08/2018 CLINICAL DATA:  Status post right thoracentesis. EXAM: CHEST  1 VIEW COMPARISON:  Film earlier today at 1135 hours FINDINGS: No visualized pneumothorax with significantly less right pleural fluid volume after thoracentesis. Stable heart size and appearance of pacemaker. No pulmonary edema or airspace consolidation. IMPRESSION: Significant decrease in right pleural fluid volume after thoracentesis. No pneumothorax identified. Electronically Signed   By: Aletta Edouard M.D.   On: 04/08/2018 15:12   Dg Chest 1 View  Result Date: 04/05/2018 CLINICAL DATA:  Post thoracentesis, history of COPD EXAM: CHEST  1 VIEW COMPARISON:  None. FINDINGS: RIGHT-sided pacemaker overlies stable enlarged cardiac silhouette. Large RIGHT effusion is decreased slightly in volume compared to prior. No pneumothorax. IMPRESSION: Decrease in RIGHT effusion.  No pneumothorax. Persistent large RIGHT effusion remains. Electronically Signed   By: Helane Gunther.D.  On: 04/05/2018 13:53   Dg Chest 2 View  Result Date: 05/01/2018 CLINICAL DATA:  Recent diagnosis of lung cancer. Shortness of breath. EXAM: CHEST - 2 VIEW COMPARISON:  April 21, 2018 FINDINGS: There is a significantly larger effusion with underlying opacity in the right lung. Only a small amount right upper lobe is aerated. The left lung remains clear. The cardiomediastinal silhouette is stable. IMPRESSION: 1. Pleural effusion and underlying opacity fill most of the right chest with only a small amount of aerated lung remaining in the upper lobe. Electronically Signed   By: Dorise Bullion III M.D   On: 05/01/2018 11:11    Dg Chest 2 View  Result Date: 04/20/2018 CLINICAL DATA:  Cough and shortness of breath EXAM: CHEST - 2 VIEW COMPARISON:  April 08, 2018 FINDINGS: There is a sizable pleural effusion on the right with atelectasis and likely consolidation in much of the right middle and lower lobe regions. The left lung is clear. Heart size and pulmonary vascularity normal. Pacemaker lead tips are attached to the right atrium and right ventricle. No adenopathy. No bone lesions. IMPRESSION: Sizable right pleural effusion with atelectasis and likely consolidation in significant portions of the right middle and lower lobes. Left lung clear. Heart size within normal limits. Pacemaker leads attached to right atrium and right ventricle. Electronically Signed   By: Lowella Grip III M.D.   On: 04/20/2018 12:55   Ct Head Wo Contrast  Result Date: 05/01/2018 CLINICAL DATA:  58 y/o  M; metastatic lung cancer staging. EXAM: CT HEAD WITHOUT CONTRAST TECHNIQUE: Contiguous axial images were obtained from the base of the skull through the vertex without intravenous contrast. COMPARISON:  None. FINDINGS: Brain: No evidence of acute infarction, hemorrhage, hydrocephalus, extra-axial collection or mass lesion/mass effect. Vascular: No hyperdense vessel or unexpected calcification. Skull: Normal. Negative for fracture or focal lesion. Sinuses/Orbits: Maxillary sinus mucous retention cyst. Additional visible paranasal sinuses and the mastoid air cells are normally aerated. Orbits are unremarkable. Other: None. IMPRESSION: No acute intracranial abnormality identified. Negative noncontrast CT of the head. Electronically Signed   By: Kristine Garbe M.D.   On: 05/01/2018 20:11   Ct Chest W Contrast  Result Date: 05/01/2018 CLINICAL DATA:  Shortness of breath. Malignant pleural effusion. Thoracentesis this afternoon. Past medical history of COPD. Metastatic adenocarcinoma on cytology from 04/05/2018. Most consistent with primary  lung adenocarcinoma. EXAM: CT CHEST, ABDOMEN, AND PELVIS WITH CONTRAST TECHNIQUE: Multidetector CT imaging of the chest, abdomen and pelvis was performed following the standard protocol during bolus administration of intravenous contrast. CONTRAST:  188mL ISOVUE-300 IOPAMIDOL (ISOVUE-300) INJECTION 61%, 13mL OMNIPAQUE IOHEXOL 300 MG/ML SOLN COMPARISON:  Chest radiograph 05/01/2018.  Today's H and P. FINDINGS: CT CHEST FINDINGS Cardiovascular: Dual lead pacer. Tortuous thoracic aorta. Normal heart size, without pericardial effusion. No central pulmonary embolism, on this non-dedicated study. Lad coronary artery calcification Mediastinum/Nodes: No supraclavicular adenopathy. Right paratracheal adenopathy at 1.8 cm on image 18/2. Borderline right hilar adenopathy at 1.3 cm. Right juxta cardiac 9 mm enlarged node on image 33/2. Lungs/Pleura: A small to moderate right-sided pleural effusion. Areas of pleural and subpleural irregularity, including at 1.2 cm on image 46/2, consistent with malignant pleural disease. No right-sided effusion. Patent airways. Scattered areas of right-sided volume loss and subsegmental atelectasis. Slightly irregular pleural-based right upper lobe pulmonary nodule measures 1.0 x 1.0 cm, including on image 44/4. An adjacent satellite nodule of 5 mm on image 48/4. Right lower lobe collapse/consolidation with air bronchograms, favored to represent compressive atelectasis.  Apparent nodularity within the right major fissure likely represents pleural-based metastasis, including at 2.2 cm on image 76/4. Musculoskeletal: No acute osseous abnormality. CT ABDOMEN PELVIS FINDINGS Hepatobiliary: High right hepatic lobe cyst. Hepatomegaly at 19.5 cm. Normal gallbladder, without biliary ductal dilatation. Pancreas: Normal, without mass or ductal dilatation. Spleen: Normal in size, without focal abnormality. Adrenals/Urinary Tract: Normal adrenal glands. Too small to characterize lower pole right renal  lesion. Normal left kidney. Normal urinary bladder. Stomach/Bowel: Normal stomach, without wall thickening. Normal colon, appendix, and terminal ileum. Normal small bowel. Vascular/Lymphatic: Aortic atherosclerosis. No abdominopelvic adenopathy. Reproductive: Normal prostate. Other: No significant free fluid. No evidence of omental or peritoneal disease. Musculoskeletal: Scattered tiny sclerotic lesions throughout the pelvis are most likely bone islands. Similar findings within the lumbar spine. IMPRESSION: 1. Right-sided pleural and thoracic nodal metastasis. The primary is presumably a spiculated anterior right upper lobe pleural based nodule. 2. Small to moderate right pleural effusion with secondary volume loss in the right lower lobe. 3. No findings of metastatic disease in the abdomen or pelvis. 4. Coronary artery atherosclerosis. Aortic Atherosclerosis (ICD10-I70.0). 5. Hepatomegaly. Electronically Signed   By: Abigail Miyamoto M.D.   On: 05/01/2018 20:59   Ct Abdomen Pelvis W Contrast  Result Date: 05/01/2018 CLINICAL DATA:  Shortness of breath. Malignant pleural effusion. Thoracentesis this afternoon. Past medical history of COPD. Metastatic adenocarcinoma on cytology from 04/05/2018. Most consistent with primary lung adenocarcinoma. EXAM: CT CHEST, ABDOMEN, AND PELVIS WITH CONTRAST TECHNIQUE: Multidetector CT imaging of the chest, abdomen and pelvis was performed following the standard protocol during bolus administration of intravenous contrast. CONTRAST:  121mL ISOVUE-300 IOPAMIDOL (ISOVUE-300) INJECTION 61%, 70mL OMNIPAQUE IOHEXOL 300 MG/ML SOLN COMPARISON:  Chest radiograph 05/01/2018.  Today's H and P. FINDINGS: CT CHEST FINDINGS Cardiovascular: Dual lead pacer. Tortuous thoracic aorta. Normal heart size, without pericardial effusion. No central pulmonary embolism, on this non-dedicated study. Lad coronary artery calcification Mediastinum/Nodes: No supraclavicular adenopathy. Right paratracheal  adenopathy at 1.8 cm on image 18/2. Borderline right hilar adenopathy at 1.3 cm. Right juxta cardiac 9 mm enlarged node on image 33/2. Lungs/Pleura: A small to moderate right-sided pleural effusion. Areas of pleural and subpleural irregularity, including at 1.2 cm on image 46/2, consistent with malignant pleural disease. No right-sided effusion. Patent airways. Scattered areas of right-sided volume loss and subsegmental atelectasis. Slightly irregular pleural-based right upper lobe pulmonary nodule measures 1.0 x 1.0 cm, including on image 44/4. An adjacent satellite nodule of 5 mm on image 48/4. Right lower lobe collapse/consolidation with air bronchograms, favored to represent compressive atelectasis. Apparent nodularity within the right major fissure likely represents pleural-based metastasis, including at 2.2 cm on image 76/4. Musculoskeletal: No acute osseous abnormality. CT ABDOMEN PELVIS FINDINGS Hepatobiliary: High right hepatic lobe cyst. Hepatomegaly at 19.5 cm. Normal gallbladder, without biliary ductal dilatation. Pancreas: Normal, without mass or ductal dilatation. Spleen: Normal in size, without focal abnormality. Adrenals/Urinary Tract: Normal adrenal glands. Too small to characterize lower pole right renal lesion. Normal left kidney. Normal urinary bladder. Stomach/Bowel: Normal stomach, without wall thickening. Normal colon, appendix, and terminal ileum. Normal small bowel. Vascular/Lymphatic: Aortic atherosclerosis. No abdominopelvic adenopathy. Reproductive: Normal prostate. Other: No significant free fluid. No evidence of omental or peritoneal disease. Musculoskeletal: Scattered tiny sclerotic lesions throughout the pelvis are most likely bone islands. Similar findings within the lumbar spine. IMPRESSION: 1. Right-sided pleural and thoracic nodal metastasis. The primary is presumably a spiculated anterior right upper lobe pleural based nodule. 2. Small to moderate right pleural effusion with  secondary  volume loss in the right lower lobe. 3. No findings of metastatic disease in the abdomen or pelvis. 4. Coronary artery atherosclerosis. Aortic Atherosclerosis (ICD10-I70.0). 5. Hepatomegaly. Electronically Signed   By: Abigail Miyamoto M.D.   On: 05/01/2018 20:59   Dg Chest Port 1 View  Result Date: 05/03/2018 CLINICAL DATA:  Pleural effusion.  Adenocarcinoma on cytology. EXAM: PORTABLE CHEST 1 VIEW COMPARISON:  05/01/2018. Chest CT 05/01/2018. FINDINGS: Cardiomegaly. Dual lead pacer. RIGHT pleural effusion, redemonstrated but decreased post thoracentesis. No pneumothorax. LEFT lung clear. No osseous findings. IMPRESSION: Decreased RIGHT pleural effusion post thoracentesis. No pneumothorax. Electronically Signed   By: Staci Righter M.D.   On: 05/03/2018 06:46   Dg Chest Port 1 View  Result Date: 05/01/2018 CLINICAL DATA:  S/p thoracentesis , history of lung carcinoma EXAM: PORTABLE CHEST - 1 VIEW COMPARISON:  Earlier film of the same day FINDINGS: No pneumothorax. Moderate residual opacity in the lower right hemithorax probably combination of effusion and lower lung consolidation/atelectasis. Improved aeration of the right upper lung. Left lung clear. Heart size of part to assess due to adjacent opacity. Stable right subclavian dual lead pacemaker. Visualized bones unremarkable. IMPRESSION: No pneumothorax post right thoracentesis. Electronically Signed   By: Lucrezia Europe M.D.   On: 05/01/2018 17:19   Dg Chest Port 1 View  Result Date: 04/08/2018 CLINICAL DATA:  Hypoxia EXAM: PORTABLE CHEST 1 VIEW COMPARISON:  Three days ago FINDINGS: Moderate right pleural effusion, less than seen previously. The left chest remains clear. Normal heart size with dual-chamber pacer leads that are partially visualized. IMPRESSION: Moderate right pleural effusion that has decreased from study 3 days ago. Electronically Signed   By: Monte Fantasia M.D.   On: 04/08/2018 13:46   Dg Chest Portable 1 View  Result Date:  04/05/2018 CLINICAL DATA:  Shortness of breath for the past 24 hours. Missed asthma medications yesterday. EXAM: PORTABLE CHEST 1 VIEW COMPARISON:  None. FINDINGS: Large right pleural effusion mild adjacent right lung atelectasis. Clear left lung. Minimal diffuse peribronchial thickening and accentuation of the interstitial markings. The right heart borders are obscured by the pleural fluid with no gross cardiac enlargement. Right subclavian pacemaker leads in satisfactory position. Unremarkable bones. IMPRESSION: 1. Large right pleural effusion with mild adjacent right lung atelectasis. 2. Minimal chronic bronchitic changes. Electronically Signed   By: Claudie Revering M.D.   On: 04/05/2018 10:35   US Thoracentesis Asp Pleural Space W/img Guide  Result Date: 05/03/2018 INDICATION: Lung carcinoma, shortness of breath, recurrent large right pleural effusion EXAM: ULTRASOUND GUIDED RIGHT THORACENTESIS MEDICATIONS: None. COMPLICATIONS: None immediate. PROCEDURE: An ultrasound guided thoracentesis was thoroughly discussed with the patient and questions answered. The benefits, risks, alternatives and complications were also discussed. The patient understands and wishes to proceed with the procedure. Written consent was obtained. Ultrasound was performed to localize and mark an adequate pocket of fluid in the right chest. The area was then prepped and draped in the normal sterile fashion. 1% Lidocaine was used for local anesthesia. Under ultrasound guidance a Safe-T-Centesis catheter was introduced. Thoracentesis was performed. The catheter was removed and a dressing applied. FINDINGS: A total of approximately 2 L of clear yellow fluid was removed. Samples were sent to the laboratory as requested by the clinical team. IMPRESSION: Successful ultrasound guided right thoracentesis yielding 2 L of pleural fluid. Follow-up chest radiograph shows no pneumothorax. Electronically Signed   By: Lucrezia Europe M.D.   On: 05/03/2018  08:59   US Thoracentesis Asp Pleural Space W/img  Guide  Result Date: 04/22/2018 INDICATION: Patient with recurrent right pleural effusion. Request is made for right thoracentesis. EXAM: ULTRASOUND GUIDED RIGHT THORACENTESIS MEDICATIONS: 10 mL 1% lidocaine COMPLICATIONS: None immediate. PROCEDURE: An ultrasound guided thoracentesis was thoroughly discussed with the patient and questions answered. The benefits, risks, alternatives and complications were also discussed. The patient understands and wishes to proceed with the procedure. Written consent was obtained. Ultrasound was performed to localize and mark an adequate pocket of fluid in the right chest. The area was then prepped and draped in the normal sterile fashion. 1% Lidocaine was used for local anesthesia. Under ultrasound guidance a Safe-T-Centesis catheter was introduced. Thoracentesis was performed. The catheter was removed and a dressing applied. FINDINGS: A total of approximately 2.4 liters of yellow, hazy fluid was removed. Samples were sent to the laboratory as requested by the clinical team. IMPRESSION: Successful ultrasound guided right thoracentesis yielding 2.4 liters of pleural fluid. Read by: Brynda Greathouse PA-C Electronically Signed   By: Corrie Mckusick D.O.   On: 04/21/2018 16:12   US Thoracentesis Asp Pleural Space W/img Guide  Result Date: 04/09/2018 INDICATION: Patient with history of CAP, COPD, asthma, dyspnea, and recurrent right pleural effusion. Request is made for therapeutic right thoracentesis. EXAM: ULTRASOUND GUIDED THERAPEUTIC RIGHT THORACENTESIS MEDICATIONS: 10 mL of 1% lidocaine COMPLICATIONS: None immediate. PROCEDURE: An ultrasound guided thoracentesis was thoroughly discussed with the patient and questions answered. The benefits, risks, alternatives and complications were also discussed. The patient understands and wishes to proceed with the procedure. Written consent was obtained. Ultrasound was performed to localize  and mark an adequate pocket of fluid in the right chest. The area was then prepped and draped in the normal sterile fashion. 1% Lidocaine was used for local anesthesia. Under ultrasound guidance a 6 Fr Safe-T-Centesis catheter was introduced. Thoracentesis was performed. The catheter was removed and a dressing applied. FINDINGS: A total of approximately 1.7 L of hazy gold fluid was removed. IMPRESSION: Successful ultrasound guided right thoracentesis yielding 1.7 L of pleural fluid. Read by: Earley Abide, PA-C Electronically Signed   By: Aletta Edouard M.D.   On: 04/08/2018 15:22   US Thoracentesis Asp Pleural Space W/img Guide  Result Date: 04/05/2018 INDICATION: Patient with history of COPD, asthma, dyspnea, and right pleural effusion. Request is made for diagnostic and therapeutic right thoracentesis. EXAM: ULTRASOUND GUIDED DIAGNOSTIC AND THERAPEUTIC RIGHT THORACENTESIS MEDICATIONS: 10 mL 1% lidocaine COMPLICATIONS: None immediate. PROCEDURE: An ultrasound guided thoracentesis was thoroughly discussed with the patient and questions answered. The benefits, risks, alternatives and complications were also discussed. The patient understands and wishes to proceed with the procedure. Written consent was obtained. Ultrasound was performed to localize and mark an adequate pocket of fluid in the right chest. The area was then prepped and draped in the normal sterile fashion. 1% Lidocaine was used for local anesthesia. Under ultrasound guidance a 6 Fr Safe-T-Centesis catheter was introduced. Thoracentesis was performed. The catheter was removed and a dressing applied. FINDINGS: A total of approximately 2 L of clear gold fluid was removed. Samples were sent to the laboratory as requested by the clinical team. IMPRESSION: Successful ultrasound guided right thoracentesis yielding 2 L of pleural fluid. Read by: Earley Abide, PA-C Electronically Signed   By: Jerilynn Mages.  Shick M.D.   On: 04/05/2018 14:05     Microbiology: Recent Results (from the past 240 hour(s))  Culture, blood (routine x 2)     Status: None (Preliminary result)   Collection Time: 05/01/18 10:16 AM  Result Value  Ref Range Status   Specimen Description   Final    BLOOD LEFT ANTECUBITAL Performed at [facility] [address] [place], [place] [address]    Special Requests   Final    BOTTLES DRAWN AEROBIC AND ANAEROBIC Blood Culture results may not be optimal due to an excessive volume of blood received in culture bottles Performed at [facility] [address], [place], [place] [address]    Culture   Final    NO GROWTH 2 DAYS Performed at [facility] [address], [place], [place] [address]    Report Status PENDING  Incomplete  Culture, blood (routine x 2)     Status: None (Preliminary result)   Collection Time: 05/01/18 11:00 AM  Result Value Ref Range Status   Specimen Description   Final    BLOOD RIGHT ANTECUBITAL Performed at [facility] [address] [place], [place] [address]    Special Requests   Final    BOTTLES DRAWN AEROBIC AND ANAEROBIC Blood Culture results may not be optimal due to an excessive volume of blood received in culture bottles Performed at [facility] [address], [place], [place] [address]    Culture   Final    NO GROWTH 2 DAYS Performed at [facility] [address] [place], [place] [address]    Report Status PENDING  Incomplete  Culture, body fluid-bottle     Status: None (Preliminary result)   Collection Time: 05/01/18  5:44 PM  Result Value Ref Range Status   Specimen Description PLEURAL  Final   Special Requests NONE  Final   Culture   Final    NO GROWTH 2 DAYS Performed at [facility] [address], [place], [place] [address]    Report Status PENDING  Incomplete  Gram stain     Status: None   Collection Time: 05/01/18  5:44 PM  Result Value Ref Range  Status   Specimen Description PLEURAL  Final   Special Requests NONE  Final   Gram Stain   Final    WBC PRESENT, PREDOMINANTLY MONONUCLEAR NO ORGANISMS SEEN CYTOSPIN SMEAR Performed at [facility], [place] [address], [place], [place] [address]    Report Status 05/01/2018 FINAL  Final  MRSA PCR Screening     Status: None   Collection Time: 05/01/18  5:54 PM  Result Value Ref Range Status   MRSA by PCR NEGATIVE NEGATIVE Final    Comment:        The GeneXpert MRSA Assay (FDA approved for NASAL specimens only), is one component of a comprehensive MRSA colonization surveillance program. It is not intended to diagnose MRSA infection nor to guide or monitor treatment for MRSA infections. Performed at [facility], [place] [address] [place],  [address]      Labs: Basic Metabolic Panel: Recent Labs  Lab 05/01/18 1016 05/02/18 0514  NA 139 137  K 2.8* 4.8  CL 103 107  CO2 24 23  GLUCOSE 135* 134*  BUN 10 9  CREATININE 1.04 0.86  CALCIUM 8.5* 8.6*  MG 1.8  --    Liver Function Tests: Recent Labs  Lab 05/01/18 1016 05/02/18 0514  AST 25 27  ALT 30 24  ALKPHOS 107 83  BILITOT 0.7 1.3*  PROT 6.8 6.2*  ALBUMIN 3.9 3.5   CBC: Recent Labs  Lab 05/01/18 1016 05/02/18 0514 05/03/18 0611  WBC 9.5  16.4* 10.8*  NEUTROABS 6.3  --  7.5  HGB 18.6* 15.5 15.8  HCT 58.1* 48.5 49.9  MCV 90.4 90.8 92.6  PLT 165 178 146*    Recent Labs    04/06/18 0601 05/01/18 1016  BNP 74.1 51.2     Principal Problem:   Malignant pleural effusion Active Problems:   Pacemaker   Recurrent pleural effusion on right   Adenocarcinoma of right lung (HCC)   COPD (chronic obstructive pulmonary disease) ([employer])   Time coordinating discharge: 35 minutes  Signed:  Murray Hodgkins, MD  [facility]  05/03/2018, 2:05 PM

## 2018-05-03 NOTE — Care Management Note (Signed)
Case Management Note  Patient Details  Name: Daniel Bryant MRN: [id number] Date of Birth: Sep 06, 1960  Subjective/Objective: TC patient's pcp office per patient request for next pcp appt-05/05/18 2 11a-Dr. Austin Miles Bermudez(see f/u on d/c) Patient voiced understanding. No further CM needs.                   Action/Plan:d/c home.   Expected Discharge Date:  05/03/18               Expected Discharge Plan:  Home/Self Care  In-House Referral:  PCP / [facility]  Discharge planning Services  CM Consult  Post Acute Care Choice:    Choice offered to:     DME Arranged:    DME Agency:     HH Arranged:    [place] Agency:     Status of Service:  Completed, signed off  If discussed at [employer], dates discussed:    Additional Comments:  Dessa Phi, RN 05/03/2018, 12:04 PM

## 2018-05-03 NOTE — Progress Notes (Signed)
Pt to be discharged to home this afternoon. Case Manager has called and confirmed with the [facility] that appointment is for Friday 05/04/2018 and this is highlighted on AVS. Pt verbalized understanding of all discharge teaching and review of Medications.

## 2018-05-03 NOTE — Progress Notes (Signed)
IR requested by Dr. Sarajane Jews for possible image-guided tunneled right pleural catheter placement.  Procedure explained to patient, including indications, risks, and benefits. Patient states that he is hesitant to proceed with procedure, and would like to talk to his oncologist before proceeding. Informed Dr. Sarajane Jews of patient's decision not to proceed with Pleurx catheter today.  IR available in future if needed.  Bea Graff Diondra Pines, PA-C 05/03/2018, 10:14 AM

## 2018-05-06 LAB — CULTURE, BLOOD (ROUTINE X 2)
Culture: NO GROWTH
Culture: NO GROWTH

## 2018-05-06 LAB — CULTURE, BODY FLUID W GRAM STAIN -BOTTLE: Culture: NO GROWTH

## 2018-05-10 ENCOUNTER — Emergency Department (HOSPITAL_COMMUNITY)
Admission: EM | Admit: 2018-05-10 | Discharge: 2018-05-10 | Disposition: A | Discharge status: Home / Self Care | Payer: No Typology Code available for payment source | Attending: Emergency Medicine | Admitting: Emergency Medicine

## 2018-05-10 ENCOUNTER — Emergency Department (HOSPITAL_COMMUNITY): Payer: No Typology Code available for payment source

## 2018-05-10 ENCOUNTER — Encounter (HOSPITAL_COMMUNITY): Payer: Self-pay

## 2018-05-10 ENCOUNTER — Other Ambulatory Visit: Payer: Self-pay

## 2018-05-10 DIAGNOSIS — F17220 Nicotine dependence, chewing tobacco, uncomplicated: Secondary | ICD-10-CM | POA: Diagnosis not present

## 2018-05-10 DIAGNOSIS — R0602 Shortness of breath: Secondary | ICD-10-CM | POA: Diagnosis present

## 2018-05-10 DIAGNOSIS — Z950 Presence of cardiac pacemaker: Secondary | ICD-10-CM | POA: Diagnosis not present

## 2018-05-10 DIAGNOSIS — J90 Pleural effusion, not elsewhere classified: Secondary | ICD-10-CM | POA: Diagnosis not present

## 2018-05-10 DIAGNOSIS — J45909 Unspecified asthma, uncomplicated: Secondary | ICD-10-CM | POA: Insufficient documentation

## 2018-05-10 DIAGNOSIS — Z79899 Other long term (current) drug therapy: Secondary | ICD-10-CM | POA: Diagnosis not present

## 2018-05-10 DIAGNOSIS — Z9889 Other specified postprocedural states: Secondary | ICD-10-CM

## 2018-05-10 DIAGNOSIS — I10 Essential (primary) hypertension: Secondary | ICD-10-CM | POA: Diagnosis not present

## 2018-05-10 LAB — BASIC METABOLIC PANEL
Anion gap: 9 (ref 5–15)
BUN: 7 mg/dL (ref 6–20)
CO2: 26 mmol/L (ref 22–32)
CREATININE: 0.98 mg/dL (ref 0.61–1.24)
Calcium: 8.5 mg/dL — ABNORMAL LOW (ref 8.9–10.3)
Chloride: 106 mmol/L (ref 98–111)
GFR calc non Af Amer: >60 mL/min (ref >60)
Glucose, Bld: 104 mg/dL — ABNORMAL HIGH (ref 70–99)
Potassium: 3.3 mmol/L — ABNORMAL LOW (ref 3.5–5.1)
Sodium: 141 mmol/L (ref 135–145)

## 2018-05-10 LAB — CBC
HCT: 54.5 % — ABNORMAL HIGH (ref 39.0–52.0)
Hemoglobin: 17.8 g/dL — ABNORMAL HIGH (ref 13.0–17.0)
MCH: 29.3 pg (ref 26.0–34.0)
MCHC: 32.7 g/dL (ref 30.0–36.0)
MCV: 89.6 fL (ref 80.0–100.0)
Platelets: 171 10*3/uL (ref 150–400)
RBC: 6.08 MIL/uL — ABNORMAL HIGH (ref 4.22–5.81)
RDW: 14.1 % (ref 11.5–15.5)
WBC: 10.4 10*3/uL (ref 4.0–10.5)
nRBC: 0 % (ref 0.0–0.2)

## 2018-05-10 LAB — PROTIME-INR
INR: 0.9 (ref 0.8–1.2)
Prothrombin Time: 12 seconds (ref 11.4–15.2)

## 2018-05-10 MED ORDER — LIDOCAINE HCL 1 % IJ SOLN
INTRAMUSCULAR | Status: AC
  Administered 2018-05-10: 10:00:00
  Filled 2018-05-10: qty 10

## 2018-05-10 NOTE — ED Notes (Signed)
Patient transported to X-ray 

## 2018-05-10 NOTE — ED Triage Notes (Signed)
Pt BIBA from home. Pt c/o SHOB since yesterday. Lung sounds clear with EMS. Recently diagnosed with lung CA. Pt states that he needs to "be drained"- hx of pleural effusion.

## 2018-05-10 NOTE — ED Notes (Signed)
Bed: TE43 Expected date:  Expected time:  Means of arrival:  Comments: EMS-SOB/lung CA

## 2018-05-10 NOTE — ED Notes (Signed)
Pt transported for thoracentesis 

## 2018-05-10 NOTE — ED Notes (Signed)
Pt mentioned to this RN that he has been unable to meet with oncologist. Pt states that his car broke down and he has not had transportation to get to appointments.

## 2018-05-10 NOTE — Procedures (Signed)
PROCEDURE SUMMARY:  Successful image-guided right thoracentesis. Yielded 3.5 liters of hazy gold fluid. Patient tolerated procedure well. No immediate complications. EBL = 0 mL.  Specimen was not sent for labs. CXR ordered.  Claris Pong Jacee Enerson PA-C 05/10/2018 10:38 AM

## 2018-05-10 NOTE — ED Notes (Signed)
Pt reports feeling like he can "finally breathe".

## 2018-05-10 NOTE — ED Provider Notes (Signed)
[facility] DEPT Provider Note   CSN: [id number] Arrival date & time: 05/10/18  0830    History   Chief Complaint Chief Complaint  Patient presents with  . Shortness of Breath    HPI Daniel Bryant is a 58 y.o. male.  HPI Patient presents the emergency room for evaluation of recurrent shortness of breath.  Patient has been diagnosed with recurrent malignant pleural effusions associated with right-sided lung adenocarcinoma.  This is the patient's fourth visit since the end of January for the same complaint.  Patient was last in the hospital admitted on February 18.  At that time he had a thoracentesis procedure with significant improvement.  During his hospitalization a Pleurx catheter was recommended.  Patient was not interested in the procedure at that time.  He was post to follow-up with his oncologist at the [facility].  Patient states he had some difficulties with transportation and missed those last 2 appointments.  Since the patient's last hospitalization he has had recurrent worsening of his shortness of breath.  Patient feels like he did when he had his previous pleural effusions.  His shortness of breath is getting worse with lying supine and activity.  Patient admits that the intervals between his thoracentesis procedures are shortening. Past Medical History:  Diagnosis Date  . Asthma   . Cancer ([place])   . COPD (chronic obstructive pulmonary disease) ([place])   . GERD (gastroesophageal reflux disease)   . High cholesterol   . History of petit-mal seizures   . Hyperlipidemia   . Hypertension   . Memory impairment   . OSA (obstructive sleep apnea)    Does not tolerate CPAP  . Pacemaker   . PTSD (post-traumatic stress disorder)   . Sick sinus syndrome [facility])     Patient Active Problem List   Diagnosis Date Noted  . Adenocarcinoma of right lung ([place]) 05/02/2018  . COPD (chronic obstructive pulmonary disease) ([place]) 05/02/2018  . Recurrent pleural  effusion on right 05/01/2018  . Pacemaker 04/21/2018  . 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 High cholesterol 04/05/2018  . Hypertension 04/05/2018  . Polycythemia 04/05/2018    Past Surgical History:  Procedure Laterality Date  . CARDIAC SURGERY    . PACEMAKER INSERTION          Home Medications    Prior to Admission medications   Medication Sig Start Date End Date Taking? Authorizing Provider  albuterol (PROVENTIL) (2.5 MG/3ML) 0.083% nebulizer solution Take 3 mLs (2.5 mg total) by nebulization 2 (two) times daily as needed for wheezing or shortness of breath. 04/09/18   Kayleen Memos, DO  atorvastatin (LIPITOR) 40 MG tablet Take 40 mg by mouth at bedtime.    [provider]  busPIRone (BUSPAR) 10 MG tablet Take 5 mg by mouth 2 (two) times daily.    [provider]  Cholecalciferol (VITAMIN D3) 25 MCG (1000 UT) CAPS Take 1,000 Units by mouth daily.    [provider]  donepezil (ARICEPT) 5 MG tablet Take 5 mg by mouth at bedtime.    [provider]  escitalopram (LEXAPRO) 20 MG tablet Take 20 mg by mouth daily.     [provider]  lisinopril (PRINIVIL,ZESTRIL) 5 MG tablet Take 5 mg by mouth daily.     [provider]  nicotine (NICODERM CQ - DOSED IN MG/24 HOURS) 21 mg/24hr patch Place 1 patch (21 mg total) onto the skin daily. Patient not taking: Reported on 05/01/2018 04/10/18   [place] Crane,  Carole N, DO  pantoprazole (PROTONIX) 40 MG tablet Take 1 tablet (40 mg total) by mouth daily. 04/10/18   Kayleen Memos, DO  vitamin B-12 (CYANOCOBALAMIN) 500 MCG tablet Take 500 mcg by mouth daily.    [provider]    Family History Family History  Problem Relation Age of Onset  . Alzheimer's disease Mother   . Cancer Mother   . Memory loss Father     Social History Social History   Tobacco Use  . Smoking status: Former Smoker    Types: E-cigarettes  . Smokeless tobacco: Current User    Types: Chew  Substance Use  Topics  . Alcohol use: Yes    Frequency: Never  . Drug use: No     Allergies   Patient has no known allergies.   Review of Systems Review of Systems  All other systems reviewed and are negative.    Physical Exam Updated Vital Signs BP (!) 131/102   Pulse 85   Temp 98.4 F (36.9 C) (Oral)   Resp (!) 22   Ht 1.829 m (6')   Wt 113.4 kg   SpO2 94%   BMI 33.91 kg/m   Physical Exam Vitals signs and nursing note reviewed.  Constitutional:      General: He is not in acute distress.    Appearance: He is well-developed.  HENT:     Head: Normocephalic and atraumatic.     Right Ear: External ear normal.     Left Ear: External ear normal.  Eyes:     General: No scleral icterus.       Right eye: No discharge.        Left eye: No discharge.     Conjunctiva/sclera: Conjunctivae normal.  Neck:     Musculoskeletal: Neck supple.     Trachea: No tracheal deviation.  Cardiovascular:     Rate and Rhythm: Normal rate and regular rhythm.  Pulmonary:     Effort: Pulmonary effort is normal. No respiratory distress.     Breath sounds: No stridor. Examination of the right-upper field reveals decreased breath sounds. Examination of the right-middle field reveals decreased breath sounds. Examination of the right-lower field reveals decreased breath sounds. Decreased breath sounds present. No wheezing or rales.  Abdominal:     General: Bowel sounds are normal. There is no distension.     Palpations: Abdomen is soft.     Tenderness: There is no abdominal tenderness. There is no guarding or rebound.  Musculoskeletal:        General: No tenderness.  Skin:    General: Skin is warm and dry.     Findings: No rash.  Neurological:     Mental Status: He is alert.     Cranial Nerves: No cranial nerve deficit (no facial droop, extraocular movements intact, no slurred speech).     Sensory: No sensory deficit.     Motor: No abnormal muscle tone or seizure activity.     Coordination:  Coordination normal.      ED Treatments / Results  Labs (all labs ordered are listed, but only abnormal results are displayed) Labs Reviewed  CBC - Abnormal; Notable for the following components:      Result Value   RBC 6.08 (*)    Hemoglobin 17.8 (*)    HCT 54.5 (*)    All other components within normal limits  BASIC METABOLIC PANEL - Abnormal; Notable for the following components:   Potassium 3.3 (*)  Glucose, Bld 104 (*)    Calcium 8.5 (*)    All other components within normal limits  PROTIME-INR    EKG None  Radiology Dg Chest 1 View  Result Date: 05/10/2018 CLINICAL DATA:  Status post right thoracentesis EXAM: CHEST  1 VIEW COMPARISON:  05/10/2018 FINDINGS: Cardiac shadow is within normal limits. Pacing device is again seen and stable. Significant reduction in right-sided pleural effusion is noted following thoracentesis. No pneumothorax is noted. Right basilar atelectatic changes are seen. IMPRESSION: No pneumothorax following right thoracentesis. Electronically Signed   By: Inez Catalina M.D.   On: 05/10/2018 11:19   Dg Chest 2 View  Result Date: 05/10/2018 CLINICAL DATA:  Shortness of breath EXAM: CHEST - 2 VIEW COMPARISON:  05/03/2018 FINDINGS: RIGHT subclavian transvenous pacemaker leads project at RIGHT atrium and RIGHT ventricle unchanged. Enlargement of cardiac silhouette with slight vascular congestion. Mediastinal contours normal. Large RIGHT pleural effusion with significant atelectasis of the inferior 2/3 of the RIGHT lung. Increase in RIGHT pleural effusion and atelectasis since prior exam. LEFT lung clear. No pneumothorax or acute osseous findings. IMPRESSION: Large RIGHT pleural effusion and significant atelectasis of the mid inferior RIGHT lung increased since 05/03/2018. Electronically Signed   By: Lavonia Dana M.D.   On: 05/10/2018 09:18   US Thoracentesis Asp Pleural Space W/img Guide  Result Date: 05/10/2018 INDICATION: Patient history right lung  adenocarcinoma, COPD, dyspnea, and recurrent malignant right pleural effusion. Request is made for therapeutic right thoracentesis. EXAM: ULTRASOUND GUIDED THERAPEUTIC RIGHT THORACENTESIS MEDICATIONS: 10 mL of% lidocaine COMPLICATIONS: None immediate. PROCEDURE: An ultrasound guided thoracentesis was thoroughly discussed with the patient and questions answered. The benefits, risks, alternatives and complications were also discussed. The patient understands and wishes to proceed with the procedure. Written consent was obtained. Ultrasound was performed to localize and mark an adequate pocket of fluid in the right chest. The area was then prepped and draped in the normal sterile fashion. 1% Lidocaine was used for local anesthesia. Under ultrasound guidance a 6 Fr Safe-T-Centesis catheter was introduced. Thoracentesis was performed. The catheter was removed and a dressing applied. FINDINGS: A total of approximately 3.5 L of hazy gold fluid was removed. IMPRESSION: Successful ultrasound guided right thoracentesis yielding 3.5 L of pleural fluid. Read by: Earley Abide, PA-C Electronically Signed   By: Jacqulynn Cadet M.D.   On: 05/10/2018 11:23    Procedures Procedures (including critical care time)  Medications Ordered in ED Medications  lidocaine (XYLOCAINE) 1 % (with pres) injection (  Given by Other 05/10/18 0945)     Initial Impression / Assessment and Plan / ED Course  I have reviewed the triage vital signs and the nursing notes.  Pertinent labs & imaging results that were available during my care of the patient were reviewed by me and considered in my medical decision making (see chart for details).  Clinical Course as of May 10 1241  Thu May 10, 2018  8299 Discussed case with Dr Laurence Ferrari.  Will see if they are able to work patient in to get a pleurex catheter.  Will need to check schedule   [JK]    Clinical Course User Index [JK] Dorie Rank, MD     Patient's presentation is  consistent with recurrent right-sided pleural effusion.  Laboratory tests and confirmatory chest x-ray have been ordered.  I discussed Pleurx catheter insertion with the patient.  At this point he agrees that that needs to be done.   He did have a thoracentesis here today  and he is feeling much better after treatment.The patient will need to have Pleurx catheter placement scheduled.  I discussed the importance of having this procedure done.  Patient will get in contact with his oncologist at the [facility] to arrange this.  Final Clinical Impressions(s) / ED Diagnoses   Final diagnoses:  Pleural effusion    ED Discharge Orders    None       Dorie Rank, MD 05/12/18 2219

## 2018-05-10 NOTE — Discharge Instructions (Signed)
Make sure to contact your oncologist to arrange for the catheter as we discussed.  Return as needed for worsening symptoms.

## 2018-05-15 ENCOUNTER — Emergency Department (HOSPITAL_COMMUNITY): Payer: No Typology Code available for payment source

## 2018-05-15 ENCOUNTER — Encounter (HOSPITAL_COMMUNITY): Payer: Self-pay | Admitting: Emergency Medicine

## 2018-05-15 ENCOUNTER — Inpatient Hospital Stay (HOSPITAL_COMMUNITY)
Admission: EM | Admit: 2018-05-15 | Discharge: 2018-05-22 | DRG: 180 | Disposition: A | Discharge status: Home Health | Payer: No Typology Code available for payment source | Source: Other Acute Inpatient Hospital | Attending: Cardiothoracic Surgery | Admitting: Cardiothoracic Surgery

## 2018-05-15 DIAGNOSIS — J9383 Other pneumothorax: Secondary | ICD-10-CM | POA: Diagnosis not present

## 2018-05-15 DIAGNOSIS — I10 Essential (primary) hypertension: Secondary | ICD-10-CM | POA: Diagnosis present

## 2018-05-15 DIAGNOSIS — G4733 Obstructive sleep apnea (adult) (pediatric): Secondary | ICD-10-CM | POA: Diagnosis present

## 2018-05-15 DIAGNOSIS — J90 Pleural effusion, not elsewhere classified: Secondary | ICD-10-CM

## 2018-05-15 DIAGNOSIS — E785 Hyperlipidemia, unspecified: Secondary | ICD-10-CM | POA: Diagnosis present

## 2018-05-15 DIAGNOSIS — Z419 Encounter for procedure for purposes other than remedying health state, unspecified: Secondary | ICD-10-CM

## 2018-05-15 DIAGNOSIS — K219 Gastro-esophageal reflux disease without esophagitis: Secondary | ICD-10-CM | POA: Diagnosis present

## 2018-05-15 DIAGNOSIS — R0602 Shortness of breath: Secondary | ICD-10-CM | POA: Diagnosis not present

## 2018-05-15 DIAGNOSIS — Z9889 Other specified postprocedural states: Secondary | ICD-10-CM

## 2018-05-15 DIAGNOSIS — C3491 Malignant neoplasm of unspecified part of right bronchus or lung: Secondary | ICD-10-CM | POA: Diagnosis not present

## 2018-05-15 DIAGNOSIS — J449 Chronic obstructive pulmonary disease, unspecified: Secondary | ICD-10-CM | POA: Diagnosis not present

## 2018-05-15 DIAGNOSIS — F4310 Post-traumatic stress disorder, unspecified: Secondary | ICD-10-CM | POA: Diagnosis present

## 2018-05-15 DIAGNOSIS — N179 Acute kidney failure, unspecified: Secondary | ICD-10-CM | POA: Diagnosis present

## 2018-05-15 DIAGNOSIS — Z9689 Presence of other specified functional implants: Secondary | ICD-10-CM

## 2018-05-15 DIAGNOSIS — J9601 Acute respiratory failure with hypoxia: Secondary | ICD-10-CM | POA: Diagnosis present

## 2018-05-15 DIAGNOSIS — F17220 Nicotine dependence, chewing tobacco, uncomplicated: Secondary | ICD-10-CM | POA: Diagnosis present

## 2018-05-15 DIAGNOSIS — Z79899 Other long term (current) drug therapy: Secondary | ICD-10-CM

## 2018-05-15 DIAGNOSIS — J910 Malignant pleural effusion: Secondary | ICD-10-CM | POA: Diagnosis present

## 2018-05-15 DIAGNOSIS — Z820 Family history of epilepsy and other diseases of the nervous system: Secondary | ICD-10-CM

## 2018-05-15 DIAGNOSIS — I495 Sick sinus syndrome: Secondary | ICD-10-CM | POA: Diagnosis present

## 2018-05-15 DIAGNOSIS — E7800 Pure hypercholesterolemia, unspecified: Secondary | ICD-10-CM | POA: Diagnosis present

## 2018-05-15 DIAGNOSIS — Z950 Presence of cardiac pacemaker: Secondary | ICD-10-CM

## 2018-05-15 DIAGNOSIS — E876 Hypokalemia: Secondary | ICD-10-CM | POA: Diagnosis present

## 2018-05-15 DIAGNOSIS — J939 Pneumothorax, unspecified: Secondary | ICD-10-CM

## 2018-05-15 LAB — BASIC METABOLIC PANEL
Anion gap: 8 (ref 5–15)
BUN: 9 mg/dL (ref 6–20)
CALCIUM: 8.8 mg/dL — AB (ref 8.9–10.3)
CO2: 28 mmol/L (ref 22–32)
Chloride: 104 mmol/L (ref 98–111)
Creatinine, Ser: 1.28 mg/dL — ABNORMAL HIGH (ref 0.61–1.24)
GFR calc Af Amer: >60 mL/min (ref >60)
GFR calc non Af Amer: >60 mL/min (ref >60)
Glucose, Bld: 114 mg/dL — ABNORMAL HIGH (ref 70–99)
Potassium: 3.4 mmol/L — ABNORMAL LOW (ref 3.5–5.1)
Sodium: 140 mmol/L (ref 135–145)

## 2018-05-15 LAB — CBC WITH DIFFERENTIAL/PLATELET
Abs Immature Granulocytes: 0.03 10*3/uL (ref 0.00–0.07)
Basophils Absolute: 0.1 10*3/uL (ref 0.0–0.1)
Basophils Relative: 1 %
EOS ABS: 1.1 10*3/uL — AB (ref 0.0–0.5)
Eosinophils Relative: 10 %
HCT: 53.6 % — ABNORMAL HIGH (ref 39.0–52.0)
Hemoglobin: 17 g/dL (ref 13.0–17.0)
Immature Granulocytes: 0 %
LYMPHS ABS: 1.9 10*3/uL (ref 0.7–4.0)
Lymphocytes Relative: 18 %
MCH: 28.3 pg (ref 26.0–34.0)
MCHC: 31.7 g/dL (ref 30.0–36.0)
MCV: 89.2 fL (ref 80.0–100.0)
Monocytes Absolute: 0.7 10*3/uL (ref 0.1–1.0)
Monocytes Relative: 6 %
Neutro Abs: 7.2 10*3/uL (ref 1.7–7.7)
Neutrophils Relative %: 65 %
PLATELETS: 184 10*3/uL (ref 150–400)
RBC: 6.01 MIL/uL — ABNORMAL HIGH (ref 4.22–5.81)
RDW: 13.9 % (ref 11.5–15.5)
WBC: 11.1 10*3/uL — ABNORMAL HIGH (ref 4.0–10.5)
nRBC: 0 % (ref 0.0–0.2)

## 2018-05-15 LAB — TYPE AND SCREEN
ABO/RH(D): A POS
Antibody Screen: NEGATIVE

## 2018-05-15 LAB — ABO/RH: ABO/RH(D): A POS

## 2018-05-15 MED ORDER — CEFAZOLIN SODIUM-DEXTROSE 2-4 GM/100ML-% IV SOLN
2.0000 g | INTRAVENOUS | Status: AC
  Administered 2018-05-16: 2 g via INTRAVENOUS
  Filled 2018-05-15 (×2): qty 100

## 2018-05-15 MED ORDER — VITAMIN D 25 MCG (1000 UNIT) PO TABS
1000.0000 [IU] | ORAL_TABLET | Freq: Every day | ORAL | Status: DC
  Administered 2018-05-17 – 2018-05-22 (×6): 1000 [IU] via ORAL
  Filled 2018-05-15 (×7): qty 1

## 2018-05-15 MED ORDER — ATORVASTATIN CALCIUM 40 MG PO TABS
40.0000 mg | ORAL_TABLET | Freq: Every day | ORAL | Status: DC
  Administered 2018-05-15 – 2018-05-21 (×7): 40 mg via ORAL
  Filled 2018-05-15 (×8): qty 1

## 2018-05-15 MED ORDER — LISINOPRIL 5 MG PO TABS
5.0000 mg | ORAL_TABLET | Freq: Every day | ORAL | Status: DC
  Administered 2018-05-16 – 2018-05-17 (×2): 5 mg via ORAL
  Filled 2018-05-15 (×2): qty 1

## 2018-05-15 MED ORDER — ONDANSETRON HCL 4 MG/2ML IJ SOLN: 4.0000 mg | Freq: Four times a day (QID) | INTRAMUSCULAR | Status: DC | PRN

## 2018-05-15 MED ORDER — ACETAMINOPHEN 325 MG PO TABS: 650.0000 mg | ORAL_TABLET | Freq: Four times a day (QID) | ORAL | Status: DC | PRN

## 2018-05-15 MED ORDER — ONDANSETRON HCL 4 MG PO TABS: 4.0000 mg | ORAL_TABLET | Freq: Four times a day (QID) | ORAL | Status: DC | PRN

## 2018-05-15 MED ORDER — ENOXAPARIN SODIUM 40 MG/0.4ML ~~LOC~~ SOLN
40.0000 mg | SUBCUTANEOUS | Status: DC
  Administered 2018-05-17 – 2018-05-22 (×6): 40 mg via SUBCUTANEOUS
  Filled 2018-05-15 (×6): qty 0.4

## 2018-05-15 MED ORDER — ESCITALOPRAM OXALATE 20 MG PO TABS
20.0000 mg | ORAL_TABLET | Freq: Every day | ORAL | Status: DC
  Administered 2018-05-16 – 2018-05-22 (×7): 20 mg via ORAL
  Filled 2018-05-15 (×7): qty 1

## 2018-05-15 MED ORDER — DEXTROSE-NACL 5-0.9 % IV SOLN
INTRAVENOUS | Status: DC
  Administered 2018-05-16 – 2018-05-17 (×3): via INTRAVENOUS

## 2018-05-15 MED ORDER — VITAMIN B-12 1000 MCG PO TABS
500.0000 ug | ORAL_TABLET | Freq: Every day | ORAL | Status: DC
  Administered 2018-05-17 – 2018-05-22 (×6): 500 ug via ORAL
  Filled 2018-05-15 (×2): qty 1
  Filled 2018-05-15: qty 5
  Filled 2018-05-15 (×5): qty 1

## 2018-05-15 MED ORDER — DONEPEZIL HCL 5 MG PO TABS
5.0000 mg | ORAL_TABLET | Freq: Every day | ORAL | Status: DC
  Administered 2018-05-15 – 2018-05-21 (×7): 5 mg via ORAL
  Filled 2018-05-15 (×7): qty 1

## 2018-05-15 MED ORDER — ACETAMINOPHEN 650 MG RE SUPP: 650.0000 mg | Freq: Four times a day (QID) | RECTAL | Status: DC | PRN

## 2018-05-15 MED ORDER — PANTOPRAZOLE SODIUM 40 MG PO TBEC
40.0000 mg | DELAYED_RELEASE_TABLET | Freq: Every day | ORAL | Status: DC
  Administered 2018-05-16 – 2018-05-22 (×7): 40 mg via ORAL
  Filled 2018-05-15 (×7): qty 1

## 2018-05-15 MED ORDER — BUSPIRONE HCL 5 MG PO TABS
5.0000 mg | ORAL_TABLET | Freq: Two times a day (BID) | ORAL | Status: DC
  Administered 2018-05-15 – 2018-05-22 (×14): 5 mg via ORAL
  Filled 2018-05-15 (×14): qty 1

## 2018-05-15 MED ORDER — SODIUM CHLORIDE 0.9 % IV SOLN: 250.0000 mL | INTRAVENOUS | Status: DC | PRN

## 2018-05-15 MED ORDER — ASPIRIN EC 81 MG PO TBEC
81.0000 mg | DELAYED_RELEASE_TABLET | Freq: Every day | ORAL | Status: DC
  Administered 2018-05-15 – 2018-05-22 (×7): 81 mg via ORAL
  Filled 2018-05-15 (×8): qty 1

## 2018-05-15 MED ORDER — SODIUM CHLORIDE 0.9% FLUSH: 3.0000 mL | INTRAVENOUS | Status: DC | PRN

## 2018-05-15 MED ORDER — OXYCODONE HCL 5 MG PO TABS: 5.0000 mg | ORAL_TABLET | ORAL | Status: DC | PRN

## 2018-05-15 MED ORDER — SENNA 8.6 MG PO TABS
1.0000 | ORAL_TABLET | Freq: Two times a day (BID) | ORAL | Status: DC
  Filled 2018-05-15 (×11): qty 1

## 2018-05-15 MED ORDER — SODIUM CHLORIDE 0.9% FLUSH
3.0000 mL | Freq: Two times a day (BID) | INTRAVENOUS | Status: DC
  Administered 2018-05-15 – 2018-05-22 (×12): 3 mL via INTRAVENOUS

## 2018-05-15 MED ORDER — ALBUTEROL SULFATE (2.5 MG/3ML) 0.083% IN NEBU
2.5000 mg | INHALATION_SOLUTION | Freq: Two times a day (BID) | RESPIRATORY_TRACT | Status: DC | PRN
  Administered 2018-05-16: 2.5 mg via RESPIRATORY_TRACT
  Filled 2018-05-15: qty 3

## 2018-05-15 NOTE — ED Notes (Signed)
Pt to xray at this time.

## 2018-05-15 NOTE — ED Notes (Signed)
Pt ambulated to bathroom without any problems 

## 2018-05-15 NOTE — ED Provider Notes (Signed)
[facility] EMERGENCY DEPARTMENT Provider Note   CSN: [id number] Arrival date & time: 05/15/18  1709    History   Chief Complaint Chief Complaint  Patient presents with  . Shortness of Breath    HPI Daniel Bryant is a 58 y.o. male.      The history is provided by the patient.  Shortness of Breath  Severity:  Moderate Onset quality:  Gradual Timing:  Intermittent Progression:  Waxing and waning Chronicity:  Recurrent Context comment:  Hx of lung cancer with recurrent plueral effusion, has had multiple thoracentesis and patient had not followed up to get arrange for pluervax. States symptoms mostly with walking but feels okay at rest.  Relieved by:  Rest Worsened by:  Exertion Associated symptoms: no abdominal pain, no chest pain, no claudication, no cough, no ear pain, no fever, no headaches, no rash, no sore throat, no sputum production, no syncope and no vomiting     Past Medical History:  Diagnosis Date  . Asthma   . Cancer ([place])   . COPD (chronic obstructive pulmonary disease) ([place])   . GERD (gastroesophageal reflux disease)   . High cholesterol   . History of petit-mal seizures   . Hyperlipidemia   . Hypertension   . Memory impairment   . OSA (obstructive sleep apnea)    Does not tolerate CPAP  . Pacemaker   . PTSD (post-traumatic stress disorder)   . Sick sinus syndrome [facility])     Patient Active Problem List   Diagnosis Date Noted  . Adenocarcinoma of right lung ([place]) 05/02/2018  . COPD (chronic obstructive pulmonary disease) ([place]) 05/02/2018  . Recurrent pleural effusion on right 05/01/2018  . Pacemaker 04/21/2018  . 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 High cholesterol 04/05/2018  . Hypertension 04/05/2018  . Polycythemia 04/05/2018    Past Surgical History:  Procedure Laterality Date  . CARDIAC SURGERY    . PACEMAKER INSERTION          Home Medications    Prior to Admission medications   Medication Sig Start  Date End Date Taking? Authorizing Provider  albuterol (PROVENTIL) (2.5 MG/3ML) 0.083% nebulizer solution Take 3 mLs (2.5 mg total) by nebulization 2 (two) times daily as needed for wheezing or shortness of breath. 04/09/18  Yes Irene Pap N, DO  atorvastatin (LIPITOR) 40 MG tablet Take 40 mg by mouth at bedtime.   Yes [provider]  Cholecalciferol (VITAMIN D3) 25 MCG (1000 UT) CAPS Take 1,000 Units by mouth daily.   Yes [provider]  donepezil (ARICEPT) 5 MG tablet Take 5 mg by mouth at bedtime.   Yes [provider]  escitalopram (LEXAPRO) 20 MG tablet Take 20 mg by mouth daily.    Yes [provider]  lisinopril (PRINIVIL,ZESTRIL) 5 MG tablet Take 5 mg by mouth daily.    Yes [provider]  pantoprazole (PROTONIX) 40 MG tablet Take 1 tablet (40 mg total) by mouth daily. 04/10/18  Yes Kayleen Memos, DO  vitamin B-12 (CYANOCOBALAMIN) 500 MCG tablet Take 500 mcg by mouth daily.   Yes [provider]  nicotine (NICODERM CQ - DOSED IN MG/24 HOURS) 21 mg/24hr patch Place 1 patch (21 mg total) onto the skin daily. Patient not taking: Reported on 05/01/2018 04/10/18   Kayleen Memos, DO    Family History Family History  Problem Relation Age of Onset  . Alzheimer's disease Mother   . Cancer Mother   . Memory loss Father  Social History Social History   Tobacco Use  . Smoking status: Former Smoker    Types: E-cigarettes  . Smokeless tobacco: Current User    Types: Chew  Substance Use Topics  . Alcohol use: Yes    Frequency: Never  . Drug use: No     Allergies   Patient has no known allergies.   Review of Systems Review of Systems  Constitutional: Negative for chills and fever.  HENT: Negative for ear pain and sore throat.   Eyes: Negative for pain and visual disturbance.  Respiratory: Positive for shortness of breath. Negative for cough and sputum production.   Cardiovascular: Negative for chest pain, palpitations,  claudication and syncope.  Gastrointestinal: Negative for abdominal pain and vomiting.  Genitourinary: Negative for dysuria and hematuria.  Musculoskeletal: Negative for arthralgias and back pain.  Skin: Negative for color change and rash.  Neurological: Negative for seizures, syncope and headaches.  All other systems reviewed and are negative.    Physical Exam Updated Vital Signs  ED Triage Vitals  Enc Vitals Group     BP 05/15/18 1711 135/82     Pulse Rate 05/15/18 1711 99     Resp 05/15/18 1711 (!) 22     Temp 05/15/18 1716 98.5 F (36.9 C)     Temp Source 05/15/18 1716 Oral     SpO2 05/15/18 1711 94 %     Weight --      Height --      Head Circumference --      Peak Flow --      Pain Score 05/15/18 1711 0     Pain Loc --      Pain Edu? --      Excl. in [place]? --     Physical Exam Vitals signs and nursing note reviewed.  Constitutional:      Appearance: He is well-developed.  HENT:     Head: Normocephalic and atraumatic.     Mouth/Throat:     Mouth: Mucous membranes are moist.  Eyes:     Conjunctiva/sclera: Conjunctivae normal.     Pupils: Pupils are equal, round, and reactive to light.  Neck:     Musculoskeletal: Normal range of motion and neck supple.  Cardiovascular:     Rate and Rhythm: Normal rate and regular rhythm.     Pulses: Normal pulses.     Heart sounds: Normal heart sounds. No murmur.  Pulmonary:     Effort: Pulmonary effort is normal. No respiratory distress.     Breath sounds: Examination of the right-lower field reveals decreased breath sounds. Decreased breath sounds present. No wheezing, rhonchi or rales.  Abdominal:     Palpations: Abdomen is soft.     Tenderness: There is no abdominal tenderness.  Musculoskeletal:     Right lower leg: No edema.     Left lower leg: No edema.  Skin:    General: Skin is warm and dry.  Neurological:     General: No focal deficit present.     Mental Status: He is alert.  Psychiatric:        Mood and  Affect: Mood normal.      ED Treatments / Results  Labs (all labs ordered are listed, but only abnormal results are displayed) Labs Reviewed  CBC WITH DIFFERENTIAL/PLATELET - Abnormal; Notable for the following components:      Result Value   WBC 11.1 (*)    RBC 6.01 (*)    HCT 53.6 (*)  Eosinophils Absolute 1.1 (*)    All other components within normal limits  BASIC METABOLIC PANEL - Abnormal; Notable for the following components:   Potassium 3.4 (*)    Glucose, Bld 114 (*)    Creatinine, Ser 1.28 (*)    Calcium 8.8 (*)    All other components within normal limits  SURGICAL PCR SCREEN  APTT  URINALYSIS, ROUTINE W REFLEX MICROSCOPIC  BLOOD GAS, ARTERIAL  TYPE AND SCREEN  ABO/RH    EKG EKG Interpretation  Date/Time:  Tuesday May 15 2018 17:16:47 EST Ventricular Rate:  100 PR Interval:    QRS Duration: 92 QT Interval:  357 QTC Calculation: 461 R Axis:   89 Text Interpretation:  Sinus tachycardia Borderline right axis deviation Confirmed by Lennice Sites [phone]) on 05/15/2018 5:42:15 PM   Radiology Dg Chest 2 View  Result Date: 05/15/2018 CLINICAL DATA:  Shortness of breath for 3 weeks. Follow up malignant RIGHT pleural effusion. EXAM: CHEST - 2 VIEW COMPARISON:  Chest radiograph May 10, 2018 FINDINGS: Re-accumulation moderate to large RIGHT pleural effusion. RIGHT lung base opacity seen with atelectasis, pneumonia or mass. No mediastinal shift. LEFT lung is clear. Cardiac silhouette is obscured on the RIGHT, limiting evaluation. Mediastinal silhouette is not suspicious. No pneumothorax. RIGHT cardiac pacemaker in situ. IMPRESSION: Reaccumulation of moderate to large RIGHT pleural effusion. Electronically Signed   By: Elon Alas M.D.   On: 05/15/2018 19:47    Procedures .Critical Care Performed by: Lennice Sites, DO Authorized by: Lennice Sites, DO   Critical care provider statement:    Critical care time (minutes):  40   Critical care time was  exclusive of:  Separately billable procedures and treating other patients and teaching time   Critical care was necessary to treat or prevent imminent or life-threatening deterioration of the following conditions:  Respiratory failure   Critical care was time spent personally by me on the following activities:  Blood draw for specimens, development of treatment plan with patient or surrogate, discussions with consultants, discussions with primary provider, evaluation of patient's response to treatment, obtaining history from patient or surrogate, ordering and performing treatments and interventions, ordering and review of laboratory studies, ordering and review of radiographic studies, pulse oximetry, re-evaluation of patient's condition and review of old charts   I assumed direction of critical care for this patient from another provider in my specialty: no     (including critical care time)  Medications Ordered in ED Medications  acetaminophen (TYLENOL) tablet 650 mg (has no administration in time range)    Or  acetaminophen (TYLENOL) suppository 650 mg (has no administration in time range)  ondansetron (ZOFRAN) tablet 4 mg (has no administration in time range)    Or  ondansetron (ZOFRAN) injection 4 mg (has no administration in time range)  pantoprazole (PROTONIX) EC tablet 40 mg (has no administration in time range)  lisinopril (PRINIVIL,ZESTRIL) tablet 5 mg (has no administration in time range)  escitalopram (LEXAPRO) tablet 20 mg (has no administration in time range)  donepezil (ARICEPT) tablet 5 mg (5 mg Oral Given 05/15/18 2330)  cholecalciferol (VITAMIN D3) tablet 1,000 Units (has no administration in time range)  atorvastatin (LIPITOR) tablet 40 mg (40 mg Oral Given 05/15/18 2330)  busPIRone (BUSPAR) tablet 5 mg (5 mg Oral Given 05/15/18 2330)  albuterol (PROVENTIL) (2.5 MG/3ML) 0.083% nebulizer solution 2.5 mg (has no administration in time range)  vitamin B-12 (CYANOCOBALAMIN) tablet 500  mcg (has no administration in time range)  enoxaparin (LOVENOX) injection 40  mg (has no administration in time range)  sodium chloride flush (NS) 0.9 % injection 3 mL (has no administration in time range)  sodium chloride flush (NS) 0.9 % injection 3 mL (has no administration in time range)  0.9 %  sodium chloride infusion (has no administration in time range)  dextrose 5 %-0.9 % sodium chloride infusion (has no administration in time range)  oxyCODONE (Oxy IR/ROXICODONE) immediate release tablet 5 mg (has no administration in time range)  senna (SENOKOT) tablet 8.6 mg (8.6 mg Oral Not Given 05/15/18 2330)  aspirin EC tablet 81 mg (81 mg Oral Given 05/15/18 2330)  ceFAZolin (ANCEF) IVPB 2g/100 mL premix (has no administration in time range)     Initial Impression / Assessment and Plan / ED Course  I have reviewed the triage vital signs and the nursing notes.  Pertinent labs & imaging results that were available during my care of the patient were reviewed by me and considered in my medical decision making (see chart for details).        Daniel Bryant is a 58 year old male with history of COPD, lung adenocarcinoma who presents to the ED with shortness of breath.  Patient hypoxic upon arrival.  Patient intermittently hypoxic throughout my care.  Patient with history of recurrent effusions in the right side of his lung over the last month.  Has been encouraged to get a pleural drain but has been resistant.  He comes in with worsening shortness of breath over the last several days after his last thoracentesis about a week ago.  Chest x-ray shows recurrent pleural effusion.  Patient otherwise had mild leukocytosis but no significant anemia, electrolyte abnormality.  Cardiothoracic surgery was consulted and they will place pleural drain tomorrow.  They recommend against thoracentesis until they just placed a pleural drain.  Patient was admitted to hospitalist service for hypoxia and recurrent pleural  effusion.  Remained hemodynamically stable throughout my care otherwise and admitted in good condition.  This chart was dictated using voice recognition software.  Despite best efforts to proofread,  errors can occur which can change the documentation meaning.    Final Clinical Impressions(s) / ED Diagnoses   Final diagnoses:  Recurrent pleural effusion on right  Acute respiratory failure with hypoxia [facility])    ED Discharge Orders    None       Lennice Sites, DO 05/15/18 2339

## 2018-05-15 NOTE — ED Notes (Signed)
Pt ambulated with pulse ox, w/o assistance. Oxygen bumped between 86-97%. Pt complaining of some sob/dizziness. Pt placed back in bed. Pt is also complaining of left eye "fogginess". MD notified.

## 2018-05-15 NOTE — ED Notes (Signed)
Dr. Prescott Gum in to assess pt

## 2018-05-15 NOTE — Progress Notes (Signed)
  Subjective: Patient examined, most recent chest x-ray and CT scan of chest images personally reviewed. Patient presents to the ED with shortness of breath and a large right pleural effusion.  He has had 3 right thoracenteses performed since January, last being about a week ago which removed almost 3 L of fluid.  Pleural fluid has shown malignant cells-adenocarcinoma of the lung.  The patient went to the [place] clinic [place] today for treatment of his lung cancer but he states he was told that they could not treat him and put him in an ambulance and sent him to the [facility] ER. Patient's last CT scan of the chest shows a spiculated right upper lobe mass, mediastinal adenopathy, and pleural based nodules and a large pleural effusion.  He has not seen an oncologist at this time.  Patient has sick sinus syndrome and has had several pacemaker generator changes.  Last echocardiogram showed normal LV function, no pericardial effusion.  Patient lives in subsidized housing, has difficulty with transportation but does not use alcohol and he stopped smoking. Objective: Vital signs in last 24 hours: Temp:  [98.5 F (36.9 C)] 98.5 F (36.9 C) (03/03 1716) Pulse Rate:  [87-119] 95 (03/03 1900) Resp:  [15-28] 22 (03/03 1900) BP: (114-136)/(71-91) 136/91 (03/03 1830) SpO2:  [89 %-96 %] 96 % (03/03 1900)  Hemodynamic parameters for last 24 hours:    Intake/Output from previous day: No intake/output data recorded. Intake/Output this shift: No intake/output data recorded.      Exam    General- alert and comfortable, overweight male no acute distress on room air    Neck- no JVD, no cervical adenopathy palpable, no carotid bruit   Lungs-diminished breath sounds on right, bilateral wheezing   Cor- regular rate and rhythm, no murmur , gallop   Abdomen- soft, non-tender   Extremities - warm, non-tender, minimal edema   Neuro- oriented, appropriate, no focal weakness   Lab Results: Recent Labs   05/15/18 1913  WBC 11.1*  HGB 17.0  HCT 53.6*  PLT 184   BMET:  Recent Labs    05/15/18 1913  NA 140  K 3.4*  CL 104  CO2 28  GLUCOSE 114*  BUN 9  CREATININE 1.28*  CALCIUM 8.8*    PT/INR: No results for input(s): LABPROT, INR in the last 72 hours. ABG    Component Value Date/Time   HCO3 20.0 05/01/2018 2029   ACIDBASEDEF 2.6 (H) 05/01/2018 2029   O2SAT 93.1 05/01/2018 2029   CBG (last 3)  No results for input(s): GLUCAP in the last 72 hours.  Assessment/Plan: S/P  Recurrent malignant right pleural effusion-adenocarcinoma the lung with thoracentesis x3 previously.  The patient needs a Pleurx catheter placed and referral to [facility] thoracic oncology clinic for further evaluation and therapy.  I have discussed with him the procedure of Pleurx catheter placement which will be set up for the OR tomorrow midday.   LOS: 0 days    Tharon Aquas Trigt III 05/15/2018

## 2018-05-15 NOTE — ED Triage Notes (Signed)
Pt here from [place] with c/o sob , pt has known lung Ca and has been fluid pulled off his lungs several times at [facility]

## 2018-05-15 NOTE — H&P (Signed)
History and Physical    Daniel Bryant YBO:[id number] DOB: 06-05-1960 DOA: 05/15/2018  PCP: Patient, No Pcp Per  Patient coming from: Home  I have personally briefly reviewed patient's old medical records in [place]  Chief Complaint: SOB  HPI: Daniel Bryant is a 58 y.o. 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  Since Jan this year he has had 4 Thoracentesis procedures (3 admissions and 1 done just in the ED a couple of days ago) with improvement after each, followed by recurrence of pleural effusion and recurrence of SOB.  Pleural fluid has shown adenocarcinoma.  Plurex catheter was recommended, but patient refused this previously.  His last thoracentesis was on 2/27 in the ER.  Patient returns to ED this evening with progressively worsening recurrent SOB, feels like he can only take small breaths, identical to prior presentations.   ED Course: CXR confirms recurrence of the R pleural effusion.   Review of Systems: As per HPI otherwise 10 point review of systems negative.   Past Medical History:  Diagnosis Date  . Asthma   . Cancer ([place])   . COPD (chronic obstructive pulmonary disease) ([place])   . GERD (gastroesophageal reflux disease)   . High cholesterol   . History of petit-mal seizures   . Hyperlipidemia   . Hypertension   . Memory impairment   . OSA (obstructive sleep apnea)    Does not tolerate CPAP  . Pacemaker   . PTSD (post-traumatic stress disorder)   . Sick sinus syndrome [facility])     Past Surgical History:  Procedure Laterality Date  . CARDIAC SURGERY    . PACEMAKER INSERTION       reports that he has quit smoking. His smoking use included e-cigarettes. His smokeless tobacco use includes chew. He reports current alcohol use. He reports that he does not use drugs.  No Known Allergies  Family History  Problem Relation Age of Onset  . Alzheimer's disease Mother   . Cancer  Mother   . Memory loss Father      Prior to Admission medications   Medication Sig Start Date End Date Taking? Authorizing Provider  albuterol (PROVENTIL) (2.5 MG/3ML) 0.083% nebulizer solution Take 3 mLs (2.5 mg total) by nebulization 2 (two) times daily as needed for wheezing or shortness of breath. 04/09/18   Kayleen Memos, DO  atorvastatin (LIPITOR) 40 MG tablet Take 40 mg by mouth at bedtime.    [provider]  busPIRone (BUSPAR) 10 MG tablet Take 5 mg by mouth 2 (two) times daily.    [provider]  Cholecalciferol (VITAMIN D3) 25 MCG (1000 UT) CAPS Take 1,000 Units by mouth daily.    [provider]  donepezil (ARICEPT) 5 MG tablet Take 5 mg by mouth at bedtime.    [provider]  escitalopram (LEXAPRO) 20 MG tablet Take 20 mg by mouth daily.     [provider]  lisinopril (PRINIVIL,ZESTRIL) 5 MG tablet Take 5 mg by mouth daily.     [provider]  nicotine (NICODERM CQ - DOSED IN MG/24 HOURS) 21 mg/24hr patch Place 1 patch (21 mg total) onto the skin daily. Patient not taking: Reported on 05/01/2018 04/10/18   Kayleen Memos, DO  pantoprazole (PROTONIX) 40 MG tablet Take 1 tablet (40 mg total) by mouth daily. 04/10/18   Kayleen Memos, DO  vitamin B-12 (CYANOCOBALAMIN) 500 MCG tablet Take 500 mcg by  mouth daily.    [provider]    Physical Exam: Vitals:   05/15/18 1800 05/15/18 1815 05/15/18 1830 05/15/18 1900  BP: 114/71 118/86 (!) 136/91   Pulse: 87 89 (!) 119 95  Resp: 19 (!) 21 (!) 28 (!) 22  Temp:      TempSrc:      SpO2: 91% 95% (!) 89% 96%    Constitutional: NAD, calm, comfortable Eyes: PERRL, lids and conjunctivae normal ENMT: Mucous membranes are moist. Posterior pharynx clear of any exudate or lesions.Normal dentition.  Neck: normal, supple, no masses, no thyromegaly Respiratory: Diminished on R. Cardiovascular: Regular rate and rhythm, no murmurs / rubs / gallops. No extremity edema. 2+ pedal  pulses. No carotid bruits.  Abdomen: no tenderness, no masses palpated. No hepatosplenomegaly. Bowel sounds positive.  Musculoskeletal: no clubbing / cyanosis. No joint deformity upper and lower extremities. Good ROM, no contractures. Normal muscle tone.  Skin: no rashes, lesions, ulcers. No induration Neurologic: CN 2-12 grossly intact. Sensation intact, DTR normal. Strength 5/5 in all 4.  Psychiatric: Normal judgment and insight. Alert and oriented x 3. Normal mood.    Labs on Admission: I have personally reviewed following labs and imaging studies  CBC: Recent Labs  Lab 05/10/18 0906 05/15/18 1913  WBC 10.4 11.1*  NEUTROABS  --  7.2  HGB 17.8* 17.0  HCT 54.5* 53.6*  MCV 89.6 89.2  PLT 171 409   Basic Metabolic Panel: Recent Labs  Lab 05/10/18 0906 05/15/18 1913  NA 141 140  K 3.3* 3.4*  CL 106 104  CO2 26 28  GLUCOSE 104* 114*  BUN 7 9  CREATININE 0.98 1.28*  CALCIUM 8.5* 8.8*   GFR: Estimated Creatinine Clearance: 79.8 mL/min (A) (by C-G formula based on SCr of 1.28 mg/dL (H)). Liver Function Tests: No results for input(s): AST, ALT, ALKPHOS, BILITOT, PROT, ALBUMIN in the last 168 hours. No results for input(s): LIPASE, AMYLASE in the last 168 hours. No results for input(s): AMMONIA in the last 168 hours. Coagulation Profile: Recent Labs  Lab 05/10/18 0906  INR 0.9   Cardiac Enzymes: No results for input(s): CKTOTAL, CKMB, CKMBINDEX, TROPONINI in the last 168 hours. BNP (last 3 results) No results for input(s): PROBNP in the last 8760 hours. HbA1C: No results for input(s): HGBA1C in the last 72 hours. CBG: No results for input(s): GLUCAP in the last 168 hours. Lipid Profile: No results for input(s): CHOL, HDL, LDLCALC, TRIG, CHOLHDL, LDLDIRECT in the last 72 hours. Thyroid Function Tests: No results for input(s): TSH, T4TOTAL, FREET4, T3FREE, THYROIDAB in the last 72 hours. Anemia Panel: No results for input(s): VITAMINB12, FOLATE, FERRITIN, TIBC,  IRON, RETICCTPCT in the last 72 hours. Urine analysis: No results found for: COLORURINE, APPEARANCEUR, LABSPEC, [place], GLUCOSEU, HGBUR, BILIRUBINUR, KETONESUR, PROTEINUR, UROBILINOGEN, NITRITE, LEUKOCYTESUR  Radiological Exams on Admission: Dg Chest 2 View  Result Date: 05/15/2018 CLINICAL DATA:  Shortness of breath for 3 weeks. Follow up malignant RIGHT pleural effusion. EXAM: CHEST - 2 VIEW COMPARISON:  Chest radiograph May 10, 2018 FINDINGS: Re-accumulation moderate to large RIGHT pleural effusion. RIGHT lung base opacity seen with atelectasis, pneumonia or mass. No mediastinal shift. LEFT lung is clear. Cardiac silhouette is obscured on the RIGHT, limiting evaluation. Mediastinal silhouette is not suspicious. No pneumothorax. RIGHT cardiac pacemaker in situ. IMPRESSION: Reaccumulation of moderate to large RIGHT pleural effusion. Electronically Signed   By: Elon Alas M.D.   On: 05/15/2018 19:47    EKG: Independently reviewed.  Assessment/Plan Principal Problem:  Recurrent pleural effusion on right Active Problems:   Hypertension   Malignant pleural effusion   Adenocarcinoma of right lung (HCC)   COPD (chronic obstructive pulmonary disease) ([place])    1. Recurrent malignant pleural effusion on R - 1. Dr. Prescott Gum to put plurex catheter in tomorrow AM and drain effusion 2. Likely home on Thurs it sounds like 3. Tele monitor in SDU per Dr. Prescott Gum 4. Oxy IR PRN pain 5. Cont pulse ox 6. O2 via [place] 2. HTN - continue home lisinopril 3. Adenocarcinoma of R lung - Needs follow up with oncology 4. COPD  1. Continue home nebs and PRN albuterol  DVT prophylaxis: SCDs, lovenox to start tomorrow evening Code Status: Full Family Communication: No family in room Disposition Plan: Home after admit Consults called: Dr. Prescott Gum Admission status: Place in obs   , [place] Hospitalists  How to contact the [facility] Attending or Consulting provider [place] or  covering provider during after hours [place], for this patient?  1. Check the care team in [facility] and look for a) attending/consulting TR[facility] provider listed and b) the [facility] team listed 2. Log into [email or web address]  Amion Physician Scheduling and messaging for groups and whole hospitals  On call and physician scheduling software for group practices, residents, hospitalists and other medical providers for call, clinic, rotation and shift schedules. OnCall Enterprise is a hospital-wide system for scheduling doctors and paging doctors on call. EasyPlot is for scientific plotting and data analysis.  [email or web address]  and use [place]'s universal password to access. If you do not have the password, please contact the hospital operator.  3. Locate the [facility] provider you are looking for under [facility] and page to a number that you can be directly reached. 4. If you still have difficulty reaching the provider, please page the [facility] (Director on Call) for the Hospitalists listed on amion for assistance.  05/15/2018, 9:12 PM

## 2018-05-16 ENCOUNTER — Observation Stay (HOSPITAL_COMMUNITY): Payer: No Typology Code available for payment source

## 2018-05-16 ENCOUNTER — Encounter (HOSPITAL_COMMUNITY): Admission: EM | Disposition: A | Discharge status: Home Health | Payer: Self-pay | Attending: Cardiothoracic Surgery

## 2018-05-16 ENCOUNTER — Observation Stay (HOSPITAL_COMMUNITY): Payer: No Typology Code available for payment source | Admitting: Certified Registered Nurse Anesthetist

## 2018-05-16 ENCOUNTER — Encounter (HOSPITAL_COMMUNITY): Payer: Self-pay | Admitting: Certified Registered Nurse Anesthetist

## 2018-05-16 ENCOUNTER — Other Ambulatory Visit: Payer: Self-pay

## 2018-05-16 DIAGNOSIS — J910 Malignant pleural effusion: Secondary | ICD-10-CM | POA: Diagnosis not present

## 2018-05-16 DIAGNOSIS — J90 Pleural effusion, not elsewhere classified: Secondary | ICD-10-CM | POA: Diagnosis not present

## 2018-05-16 HISTORY — PX: CHEST TUBE INSERTION: SHX231

## 2018-05-16 LAB — BLOOD GAS, ARTERIAL
Acid-Base Excess: 2 mmol/L (ref 0.0–2.0)
Bicarbonate: 25.7 mmol/L (ref 20.0–28.0)
Drawn by: 10006
O2 Saturation: 95 %
Patient temperature: 98
pCO2 arterial: 37.3 mmHg (ref 32.0–48.0)
pH, Arterial: 7.452 — ABNORMAL HIGH (ref 7.350–7.450)
pO2, Arterial: 73.2 mmHg — ABNORMAL LOW (ref 83.0–108.0)

## 2018-05-16 LAB — URINALYSIS, ROUTINE W REFLEX MICROSCOPIC
Bilirubin Urine: NEGATIVE
Glucose, UA: NEGATIVE mg/dL
Hgb urine dipstick: NEGATIVE
Ketones, ur: NEGATIVE mg/dL
Leukocytes,Ua: NEGATIVE
Nitrite: NEGATIVE
Protein, ur: NEGATIVE mg/dL
Specific Gravity, Urine: 1.027 (ref 1.005–1.030)
pH: 5 (ref 5.0–8.0)

## 2018-05-16 LAB — SURGICAL PCR SCREEN
MRSA, PCR: NEGATIVE
Staphylococcus aureus: NEGATIVE

## 2018-05-16 LAB — APTT: aPTT: 27 seconds (ref 24–36)

## 2018-05-16 SURGERY — INSERTION, PLEURAL DRAINAGE CATHETER
Anesthesia: Monitor Anesthesia Care | Site: Chest | Laterality: Right

## 2018-05-16 MED ORDER — PROPOFOL 500 MG/50ML IV EMUL
INTRAVENOUS | Status: DC | PRN
  Administered 2018-05-16: 75 ug/kg/min via INTRAVENOUS

## 2018-05-16 MED ORDER — PROMETHAZINE HCL 25 MG/ML IJ SOLN: 6.2500 mg | INTRAMUSCULAR | Status: DC | PRN

## 2018-05-16 MED ORDER — FENTANYL CITRATE (PF) 100 MCG/2ML IJ SOLN
INTRAMUSCULAR | Status: DC | PRN
  Administered 2018-05-16 (×2): 25 ug via INTRAVENOUS
  Administered 2018-05-16: 50 ug via INTRAVENOUS

## 2018-05-16 MED ORDER — MIDAZOLAM HCL 2 MG/2ML IJ SOLN
INTRAMUSCULAR | Status: AC
  Filled 2018-05-16: qty 2

## 2018-05-16 MED ORDER — LIDOCAINE 2% (20 MG/ML) 5 ML SYRINGE
INTRAMUSCULAR | Status: AC
  Filled 2018-05-16: qty 5

## 2018-05-16 MED ORDER — OXYCODONE HCL 5 MG PO TABS
5.0000 mg | ORAL_TABLET | ORAL | Status: DC | PRN
  Administered 2018-05-16 – 2018-05-21 (×13): 5 mg via ORAL
  Filled 2018-05-16 (×14): qty 1

## 2018-05-16 MED ORDER — LEVALBUTEROL HCL 1.25 MG/0.5ML IN NEBU
1.2500 mg | INHALATION_SOLUTION | Freq: Four times a day (QID) | RESPIRATORY_TRACT | Status: DC
  Administered 2018-05-16 – 2018-05-17 (×4): 1.25 mg via RESPIRATORY_TRACT
  Filled 2018-05-16 (×4): qty 0.5

## 2018-05-16 MED ORDER — PROPOFOL 10 MG/ML IV BOLUS
INTRAVENOUS | Status: DC | PRN
  Administered 2018-05-16: 20 mg via INTRAVENOUS

## 2018-05-16 MED ORDER — LIDOCAINE HCL 1 % IJ SOLN
INTRAMUSCULAR | Status: DC | PRN
  Administered 2018-05-16: 30 mL

## 2018-05-16 MED ORDER — LACTATED RINGERS IV SOLN
INTRAVENOUS | Status: DC
  Administered 2018-05-16 (×2): via INTRAVENOUS

## 2018-05-16 MED ORDER — FENTANYL CITRATE (PF) 250 MCG/5ML IJ SOLN
INTRAMUSCULAR | Status: AC
  Filled 2018-05-16: qty 5

## 2018-05-16 MED ORDER — FENTANYL CITRATE (PF) 100 MCG/2ML IJ SOLN: 25.0000 ug | INTRAMUSCULAR | Status: DC | PRN

## 2018-05-16 MED ORDER — LIDOCAINE HCL (PF) 1 % IJ SOLN
INTRAMUSCULAR | Status: AC
  Filled 2018-05-16: qty 30

## 2018-05-16 MED ORDER — MIDAZOLAM HCL 2 MG/2ML IJ SOLN
INTRAMUSCULAR | Status: DC | PRN
  Administered 2018-05-16: 2 mg via INTRAVENOUS

## 2018-05-16 MED ORDER — SODIUM CHLORIDE 0.9 % IR SOLN
Status: DC | PRN
  Administered 2018-05-16: 1000 mL

## 2018-05-16 MED ORDER — LIDOCAINE HCL (CARDIAC) PF 100 MG/5ML IV SOSY
PREFILLED_SYRINGE | INTRAVENOUS | Status: DC | PRN
  Administered 2018-05-16: 100 mg via INTRAVENOUS

## 2018-05-16 MED ORDER — KETOROLAC TROMETHAMINE 15 MG/ML IJ SOLN
15.0000 mg | Freq: Four times a day (QID) | INTRAMUSCULAR | Status: DC
  Administered 2018-05-16 – 2018-05-17 (×3): 15 mg via INTRAVENOUS
  Filled 2018-05-16 (×3): qty 1

## 2018-05-16 SURGICAL SUPPLY — 37 items
CANISTER SUCT 3000ML PPV (MISCELLANEOUS) ×3 IMPLANT
CATH TROCAR 20FR (CATHETERS) ×3 IMPLANT
CONT SPEC 4OZ CLIKSEAL STRL BL (MISCELLANEOUS) ×3 IMPLANT
COVER SURGICAL LIGHT HANDLE (MISCELLANEOUS) ×3 IMPLANT
COVER WAND RF STERILE (DRAPES) IMPLANT
DECANTER SPIKE VIAL GLASS SM (MISCELLANEOUS) ×3 IMPLANT
DERMABOND ADVANCED (GAUZE/BANDAGES/DRESSINGS) ×2
DERMABOND ADVANCED .7 DNX12 (GAUZE/BANDAGES/DRESSINGS) ×1 IMPLANT
DRAPE C-ARM 42X72 X-RAY (DRAPES) ×3 IMPLANT
DRAPE LAPAROSCOPIC ABDOMINAL (DRAPES) ×3 IMPLANT
ELECT REM PT RETURN 9FT ADLT (ELECTROSURGICAL) ×3
ELECTRODE REM PT RTRN 9FT ADLT (ELECTROSURGICAL) ×1 IMPLANT
GAUZE SPONGE 4X4 12PLY STRL (GAUZE/BANDAGES/DRESSINGS) ×3 IMPLANT
GLOVE BIO SURGEON STRL SZ7.5 (GLOVE) ×6 IMPLANT
GLOVE BIOGEL PI IND STRL 6 (GLOVE) ×3 IMPLANT
GLOVE BIOGEL PI INDICATOR 6 (GLOVE) ×6
GOWN STRL REUS W/ TWL LRG LVL3 (GOWN DISPOSABLE) ×3 IMPLANT
GOWN STRL REUS W/TWL LRG LVL3 (GOWN DISPOSABLE) ×6
KIT BASIN OR (CUSTOM PROCEDURE TRAY) ×3 IMPLANT
KIT PLEURX DRAIN CATH 1000ML (MISCELLANEOUS) ×9 IMPLANT
KIT PLEURX DRAIN CATH 15.5FR (DRAIN) ×6 IMPLANT
KIT TURNOVER KIT B (KITS) ×3 IMPLANT
NEEDLE HYPO 25GX1X1/2 BEV (NEEDLE) ×3 IMPLANT
NS IRRIG 1000ML POUR BTL (IV SOLUTION) ×3 IMPLANT
PACK GENERAL/GYN (CUSTOM PROCEDURE TRAY) ×3 IMPLANT
PAD ARMBOARD 7.5X6 YLW CONV (MISCELLANEOUS) ×6 IMPLANT
SET DRAINAGE LINE (MISCELLANEOUS) IMPLANT
SUT ETHILON 3 0 PS 1 (SUTURE) ×3 IMPLANT
SUT SILK 2 0 SH (SUTURE) ×3 IMPLANT
SUT VIC AB 3-0 SH 8-18 (SUTURE) ×3 IMPLANT
SYR CONTROL 10ML LL (SYRINGE) ×3 IMPLANT
SYSTEM SAHARA CHEST DRAIN RE-I (WOUND CARE) ×3 IMPLANT
TAPE CLOTH SURG 4X10 WHT LF (GAUZE/BANDAGES/DRESSINGS) ×3 IMPLANT
TOWEL GREEN STERILE (TOWEL DISPOSABLE) ×3 IMPLANT
TOWEL GREEN STERILE FF (TOWEL DISPOSABLE) ×3 IMPLANT
VALVE REPLACEMENT CAP (MISCELLANEOUS) IMPLANT
WATER STERILE IRR 1000ML POUR (IV SOLUTION) ×3 IMPLANT

## 2018-05-16 NOTE — Progress Notes (Signed)
Pre Procedure note for inpatients:   Stiven Kaspar has been scheduled for Procedure(s): INSERTION PLEURAL DRAINAGE CATHETER (Right) today. The various methods of treatment have been discussed with the patient. After consideration of the risks, benefits and treatment options the patient has consented to the planned procedure.   The patient has been seen and labs reviewed. There are no changes in the patient's condition to prevent proceeding with the planned procedure today.  Recent labs:  Lab Results  Component Value Date   WBC 11.1 (H) 05/15/2018   HGB 17.0 05/15/2018   HCT 53.6 (H) 05/15/2018   PLT 184 05/15/2018   GLUCOSE 114 (H) 05/15/2018   ALT 24 05/02/2018   AST 27 05/02/2018   NA 140 05/15/2018   K 3.4 (L) 05/15/2018   CL 104 05/15/2018   CREATININE 1.28 (H) 05/15/2018   BUN 9 05/15/2018   CO2 28 05/15/2018   INR 0.9 05/10/2018    Ivin Poot III, MD 05/16/2018 1:05 PM

## 2018-05-16 NOTE — Progress Notes (Signed)
RT Note:  RN called and informed me that patient was having increased SOB.  I told Butch Penny, RN that we could try increasing O2 to 3L instead of 2.  Patient has inspiratory and expiratory wheeze on auscultation.  Procedure to remove fluid is scheduled for later today, gave patient a PRN breathing treatment but do not believe that this will improve patient issues as well as catheter placement will.

## 2018-05-16 NOTE — Plan of Care (Signed)
  Problem: Health Behavior/Discharge Planning: Goal: Ability to manage health-related needs will improve Outcome: Not Progressing   Problem: Clinical Measurements: Goal: Respiratory complications will improve Outcome: Not Progressing

## 2018-05-16 NOTE — Anesthesia Postprocedure Evaluation (Signed)
Anesthesia Post Note  Patient: Daniel Bryant  Procedure(s) Performed: INSERTION PLEURAL DRAINAGE CATHETER (Right Chest) Chest Tube Insertion (Right Chest)     Patient location during evaluation: PACU Anesthesia Type: MAC Level of consciousness: awake and alert Pain management: pain level controlled Vital Signs Assessment: post-procedure vital signs reviewed and stable Respiratory status: spontaneous breathing, nonlabored ventilation, respiratory function stable and patient connected to nasal cannula oxygen Cardiovascular status: stable and blood pressure returned to baseline Postop Assessment: no apparent nausea or vomiting Anesthetic complications: no    Last Vitals:  Vitals:   05/16/18 1702 05/16/18 1717  BP: (!) 136/102 (!) 131/93  Pulse: 89 95  Resp: (!) 25 20  Temp: (!) 36.4 C   SpO2: 97% 97%    Last Pain:  Vitals:   05/16/18 1702  TempSrc:   PainSc: Asleep                 [name]

## 2018-05-16 NOTE — Anesthesia Preprocedure Evaluation (Signed)
Anesthesia Evaluation  Patient identified by MRN, date of birth, ID band Patient awake    Reviewed: Allergy & Precautions, NPO status , Patient's Chart, lab work & pertinent test results  Airway Mallampati: II  TM Distance: >3 FB Neck ROM: Full    Dental no notable dental hx.    Pulmonary sleep apnea , COPD, former smoker,    Pulmonary exam normal breath sounds clear to auscultation       Cardiovascular hypertension, Normal cardiovascular exam+ pacemaker  Rhythm:Regular Rate:Normal     Neuro/Psych negative neurological ROS  negative psych ROS   GI/Hepatic negative GI ROS, Neg liver ROS,   Endo/Other  negative endocrine ROS  Renal/GU negative Renal ROS  negative genitourinary   Musculoskeletal negative musculoskeletal ROS (+)   Abdominal   Peds negative pediatric ROS (+)  Hematology negative hematology ROS (+)   Anesthesia Other Findings   Reproductive/Obstetrics negative OB ROS                             Anesthesia Physical Anesthesia Plan  ASA: III  Anesthesia Plan: MAC   Post-op Pain Management:    Induction: Intravenous  PONV Risk Score and Plan: 1 and Ondansetron and Treatment may vary due to age or medical condition  Airway Management Planned: Simple Face Mask  Additional Equipment:   Intra-op Plan:   Post-operative Plan:   Informed Consent: I have reviewed the patients History and Physical, chart, labs and discussed the procedure including the risks, benefits and alternatives for the proposed anesthesia with the patient or authorized representative who has indicated his/her understanding and acceptance.     Dental advisory given  Plan Discussed with: CRNA and Surgeon  Anesthesia Plan Comments:         Anesthesia Quick Evaluation

## 2018-05-16 NOTE — Progress Notes (Signed)
Pt was transferred from PACU, on arrival he was quite sleepy, but easily to arouse, alert and oriented x 4, then back to sleep with snoring loudly. O2 NCL 3 LPM given, his SPO2 95-98%, vital sings stable, normal sinus rhythm on monitor. Chest tune connected with wall suction,setting pessure at - 20 cmH2O. Pt had significant air leak in chest tube, drainage was serosanguinous, no active bleeding seen, no subcutaneous emphysema. Dressing was dray clean and intact. He has no pain.  Continue to monitor.   Kennyth Lose, BSN,RN,PCCN-CMC

## 2018-05-16 NOTE — Brief Op Note (Addendum)
05/16/2018  4:54 PM  PATIENT:  Daniel Bryant  58 y.o. male  PRE-OPERATIVE DIAGNOSIS:  pleural effusion, malignant  POST-OPERATIVE DIAGNOSIS:  pleural effusion, malignant, basilar R pneumothorax  PROCEDURE:  Procedure(s): INSERTION PLEURAL DRAINAGE CATHETER (Right) Chest Tube Insertion (Right) Drainage 2.5 L R pleural fluid  Patient had a moderate right basilar pneumothorax after drainage of the effusion due to suction of air through the Pleurx catheter as it was being inserted and he became agitated with vigorous tachypnea.  Because his O2 saturation was borderline on fairly high-dose oxygen in the operating room I felt that a chest tube to resolve the pneumothorax would be beneficial and this was placed under local anesthesia while the patient was still receiving conscious monitored sedation by anesthesia.  A chest x-ray was taken following tube insertion showing resolution of pneumothorax.  There is no significant air leak through the chest tube.  SURGEON:  Surgeon(s) and Role:    Ivin Poot, MD - Primary  PHYSICIAN ASSISTANT:   ASSISTANTS: none   ANESTHESIA:   local and topical  EBL:  25 mL   BLOOD ADMINISTERED:none  DRAINS: 30F Chest Tube(s) in the R pleural space   LOCAL MEDICATIONS USED:  LIDOCAINE  and Amount: 15 ml  SPECIMEN:  none  DISPOSITION OF SPECIMEN:  N/A  COUNTS:  YES  TOURNIQUET:  * No tourniquets in log *  DICTATION: .Dragon Dictation and Note written in paper chart  PLAN OF CARE: return to 4 E  PATIENT DISPOSITION:  PACU - hemodynamically stable.   Delay start of Pharmacological VTE agent (>24hrs) due to surgical blood loss or risk of bleeding: yes

## 2018-05-16 NOTE — Transfer of Care (Signed)
Immediate Anesthesia Transfer of Care Note  Patient: Daniel Bryant  Procedure(s) Performed: INSERTION PLEURAL DRAINAGE CATHETER (Right Chest) Chest Tube Insertion (Right Chest)  Patient Location: PACU  Anesthesia Type:MAC  Level of Consciousness: drowsy  Airway & Oxygen Therapy: Patient Spontanous Breathing and Patient connected to face mask oxygen  Post-op Assessment: Report given to RN, Post -op Vital signs reviewed and stable and Patient moving all extremities  Post vital signs: Reviewed and stable  Last Vitals:  Vitals Value Taken Time  BP 136/102 05/16/2018  5:02 PM  Temp    Pulse 94 05/16/2018  5:07 PM  Resp 27 05/16/2018  5:07 PM  SpO2 98 % 05/16/2018  5:07 PM  Vitals shown include unvalidated device data.  Last Pain:  Vitals:   05/16/18 1257  TempSrc: Oral  PainSc:          Complications: No apparent anesthesia complications

## 2018-05-16 NOTE — Progress Notes (Signed)
PROGRESS NOTE    Daniel Bryant  DGL:[id number] DOB: 01-31-1961 DOA: 05/15/2018 PCP: Patient, No Pcp Per  Outpatient Specialists:     Brief Narrative: Daniel Bryant is a 58 y.o. 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  Since Jan this year he has had 4 Thoracentesis procedures (3 admissions and 1 done just in the ED a couple of days ago) with improvement after each, followed by recurrence of pleural effusion and recurrence of SOB.  Pleural fluid has shown adenocarcinoma.  Plurex catheter was recommended, but patient refused this previously.  His last thoracentesis was on 2/27 in the ER.  Patient returns to ED this evening with progressively worsening recurrent SOB, feels like he can only take small breaths, identical to prior presentations.  05/16/2018: Thoracic surgery input is highly appreciated.  Patient will proceed with Pleurx catheter placement today.  Assessment & Plan:   Principal Problem:   Recurrent pleural effusion on right Active Problems:   Hypertension   Malignant pleural effusion   Adenocarcinoma of right lung (HCC)   COPD (chronic obstructive pulmonary disease) (HCC)  Recurrent right malignant pleural effusion:  For Pleurx catheter placement today.   Managed supportively.   Cardiothoracic input is highly appreciated.    Hypertension:  Continue to optimize.   Patient's blood pressure is well controlled.  Adenocarcinoma of right lung: Follow with oncology on discharge.  COPD: Stable. Continue current management.  DVT prophylaxis: SCDs. Code Status: Full Family Communication:   Disposition Plan: Home eventually  Consult called: Dr. Prescott Gum   Procedures:   Pleurx catheter placement is planned  Antimicrobials:   None   Subjective: Shortness of breath has improved.  Objective: Vitals:   05/16/18 0820 05/16/18 1114 05/16/18 1257 05/16/18 1438  BP: (!) 129/96 (!) 139/98 132/89     Pulse: 81 93 85   Resp: 18 19 20    Temp: 97.7 F (36.5 C) 97.7 F (36.5 C) 98 F (36.7 C)   TempSrc: Oral Oral Oral   SpO2: 93% 94% 96%   Weight:    112.3 kg  Height:    6' (1.829 m)    Intake/Output Summary (Last 24 hours) at 05/16/2018 1650 Last data filed at 05/16/2018 1621 Gross per 24 hour  Intake -  Output 2425 ml  Net -2425 ml   Filed Weights   05/15/18 2227 05/16/18 1438  Weight: 112.3 kg 112.3 kg    Examination:  General exam: Appears calm and comfortable  Respiratory system: No air entry right lung posteriorly Cardiovascular system: S1 & S2.   Gastrointestinal system: Abdomen is obese, soft and nontender.  Organs are difficult to assess.   Central nervous system: Alert and oriented.  Patient moves all limbs. Extremities: No leg edema.  Data Reviewed: I have personally reviewed following labs and imaging studies  CBC: Recent Labs  Lab 05/10/18 0906 05/15/18 1913  WBC 10.4 11.1*  NEUTROABS  --  7.2  HGB 17.8* 17.0  HCT 54.5* 53.6*  MCV 89.6 89.2  PLT 171 518   Basic Metabolic Panel: Recent Labs  Lab 05/10/18 0906 05/15/18 1913  NA 141 140  K 3.3* 3.4*  CL 106 104  CO2 26 28  GLUCOSE 104* 114*  BUN 7 9  CREATININE 0.98 1.28*  CALCIUM 8.5* 8.8*   GFR: Estimated Creatinine Clearance: 79.4 mL/min (A) (by C-G formula based on SCr of 1.28 mg/dL (H)). Liver Function Tests: No results for input(s): AST, ALT,  ALKPHOS, BILITOT, PROT, ALBUMIN in the last 168 hours. No results for input(s): LIPASE, AMYLASE in the last 168 hours. No results for input(s): AMMONIA in the last 168 hours. Coagulation Profile: Recent Labs  Lab 05/10/18 0906  INR 0.9   Cardiac Enzymes: No results for input(s): CKTOTAL, CKMB, CKMBINDEX, TROPONINI in the last 168 hours. BNP (last 3 results) No results for input(s): PROBNP in the last 8760 hours. HbA1C: No results for input(s): HGBA1C in the last 72 hours. CBG: No results for input(s): GLUCAP in the last 168  hours. Lipid Profile: No results for input(s): CHOL, HDL, LDLCALC, TRIG, CHOLHDL, LDLDIRECT in the last 72 hours. Thyroid Function Tests: No results for input(s): TSH, T4TOTAL, FREET4, T3FREE, THYROIDAB in the last 72 hours. Anemia Panel: No results for input(s): VITAMINB12, FOLATE, FERRITIN, TIBC, IRON, RETICCTPCT in the last 72 hours. Urine analysis:    Component Value Date/Time   COLORURINE AMBER (A) 05/15/2018 0008   APPEARANCEUR CLEAR 05/15/2018 0008   LABSPEC 1.027 05/15/2018 0008   PHURINE 5.0 05/15/2018 0008   GLUCOSEU NEGATIVE 05/15/2018 0008   HGBUR NEGATIVE 05/15/2018 0008   BILIRUBINUR NEGATIVE 05/15/2018 0008   KETONESUR NEGATIVE 05/15/2018 0008   PROTEINUR NEGATIVE 05/15/2018 0008   NITRITE NEGATIVE 05/15/2018 0008   LEUKOCYTESUR NEGATIVE 05/15/2018 0008   Sepsis Labs: @LABRCNTIP (procalcitonin:4,lacticidven:4)  ) Recent Results (from the past 240 hour(s))  Surgical pcr screen     Status: None   Collection Time: 05/15/18 11:52 PM  Result Value Ref Range Status   MRSA, PCR NEGATIVE NEGATIVE Final   Staphylococcus aureus NEGATIVE NEGATIVE Final    Comment: (NOTE) The Xpert SA Assay (FDA approved for NASAL specimens in patients 45 years of age and older), is one component of a comprehensive surveillance program. It is not intended to diagnose infection nor to guide or monitor treatment. Performed at [facility], [place] [address], [place], [place] [address]          Radiology Studies: Dg Chest 2 View  Result Date: 05/15/2018 CLINICAL DATA:  Shortness of breath for 3 weeks. Follow up malignant RIGHT pleural effusion. EXAM: CHEST - 2 VIEW COMPARISON:  Chest radiograph May 10, 2018 FINDINGS: Re-accumulation moderate to large RIGHT pleural effusion. RIGHT lung base opacity seen with atelectasis, pneumonia or mass. No mediastinal shift. LEFT lung is clear. Cardiac silhouette is obscured on the RIGHT, limiting evaluation. Mediastinal silhouette is not  suspicious. No pneumothorax. RIGHT cardiac pacemaker in situ. IMPRESSION: Reaccumulation of moderate to large RIGHT pleural effusion. Electronically Signed   By: Elon Alas M.D.   On: 05/15/2018 19:47   Dg Chest Portable 1 View  Result Date: 05/16/2018 CLINICAL DATA:  Pleural catheter placement EXAM: PORTABLE CHEST 1 VIEW COMPARISON:  05/15/2018 FINDINGS: A right tunneled pleural drain has been placed and the right pleural effusion has resolved. A right pneumothorax has developed. Overall, the amount of pleural air is less than the amount of pleural fluid seen previously and the lung is better expanded. There is no shift of the mediastinum. Lungs are under aerated with vascular crowding. Do lead right subclavian pacemaker device is stable. Upper normal heart size. IMPRESSION: Tunneled right pleural drain placed with resolution of the pleural fluid and a small ensuing basilar pneumothorax. Electronically Signed   By: Marybelle Killings M.D.   On: 05/16/2018 16:45        Scheduled Meds: . [MAR Hold] aspirin EC  81 mg Oral Daily  . [MAR Hold] atorvastatin  40 mg Oral QHS  . [  MAR Hold] busPIRone  5 mg Oral BID  . [MAR Hold] cholecalciferol  1,000 Units Oral Daily  . [MAR Hold] donepezil  5 mg Oral QHS  . [MAR Hold] enoxaparin (LOVENOX) injection  40 mg Subcutaneous Q24H  . [MAR Hold] escitalopram  20 mg Oral Daily  . [MAR Hold] lisinopril  5 mg Oral Daily  . [MAR Hold] pantoprazole  40 mg Oral Daily  . [MAR Hold] senna  1 tablet Oral BID  . [MAR Hold] sodium chloride flush  3 mL Intravenous Q12H  . [MAR Hold] vitamin B-12  500 mcg Oral Daily   Continuous Infusions: . [MAR Hold] sodium chloride    . dextrose 5 % and 0.9% NaCl 50 mL/hr at 05/16/18 0626  . lactated ringers 10 mL/hr at 05/16/18 1445     LOS: 0 days    Time spent: 25 minutes   Dana Allan, MD  [facility] Pager #: [phone] 7PM-7AM contact night coverage as above

## 2018-05-17 ENCOUNTER — Encounter (HOSPITAL_COMMUNITY): Payer: Self-pay | Admitting: Cardiothoracic Surgery

## 2018-05-17 ENCOUNTER — Observation Stay (HOSPITAL_COMMUNITY): Payer: No Typology Code available for payment source

## 2018-05-17 DIAGNOSIS — F17220 Nicotine dependence, chewing tobacco, uncomplicated: Secondary | ICD-10-CM | POA: Diagnosis present

## 2018-05-17 DIAGNOSIS — J449 Chronic obstructive pulmonary disease, unspecified: Secondary | ICD-10-CM | POA: Diagnosis present

## 2018-05-17 DIAGNOSIS — C3491 Malignant neoplasm of unspecified part of right bronchus or lung: Secondary | ICD-10-CM | POA: Diagnosis present

## 2018-05-17 DIAGNOSIS — Z79899 Other long term (current) drug therapy: Secondary | ICD-10-CM | POA: Diagnosis not present

## 2018-05-17 DIAGNOSIS — R0602 Shortness of breath: Secondary | ICD-10-CM | POA: Diagnosis present

## 2018-05-17 DIAGNOSIS — E785 Hyperlipidemia, unspecified: Secondary | ICD-10-CM | POA: Diagnosis present

## 2018-05-17 DIAGNOSIS — F4310 Post-traumatic stress disorder, unspecified: Secondary | ICD-10-CM | POA: Diagnosis present

## 2018-05-17 DIAGNOSIS — N179 Acute kidney failure, unspecified: Secondary | ICD-10-CM | POA: Diagnosis present

## 2018-05-17 DIAGNOSIS — I10 Essential (primary) hypertension: Secondary | ICD-10-CM | POA: Diagnosis present

## 2018-05-17 DIAGNOSIS — J9383 Other pneumothorax: Secondary | ICD-10-CM | POA: Diagnosis not present

## 2018-05-17 DIAGNOSIS — J90 Pleural effusion, not elsewhere classified: Secondary | ICD-10-CM | POA: Diagnosis not present

## 2018-05-17 DIAGNOSIS — Z950 Presence of cardiac pacemaker: Secondary | ICD-10-CM | POA: Diagnosis not present

## 2018-05-17 DIAGNOSIS — E7800 Pure hypercholesterolemia, unspecified: Secondary | ICD-10-CM | POA: Diagnosis present

## 2018-05-17 DIAGNOSIS — J910 Malignant pleural effusion: Secondary | ICD-10-CM | POA: Diagnosis present

## 2018-05-17 DIAGNOSIS — J9601 Acute respiratory failure with hypoxia: Secondary | ICD-10-CM | POA: Diagnosis present

## 2018-05-17 DIAGNOSIS — Z820 Family history of epilepsy and other diseases of the nervous system: Secondary | ICD-10-CM | POA: Diagnosis not present

## 2018-05-17 DIAGNOSIS — G4733 Obstructive sleep apnea (adult) (pediatric): Secondary | ICD-10-CM | POA: Diagnosis present

## 2018-05-17 DIAGNOSIS — I495 Sick sinus syndrome: Secondary | ICD-10-CM | POA: Diagnosis present

## 2018-05-17 DIAGNOSIS — K219 Gastro-esophageal reflux disease without esophagitis: Secondary | ICD-10-CM | POA: Diagnosis present

## 2018-05-17 DIAGNOSIS — E876 Hypokalemia: Secondary | ICD-10-CM | POA: Diagnosis present

## 2018-05-17 MED ORDER — FENTANYL CITRATE (PF) 100 MCG/2ML IJ SOLN
25.0000 ug | Freq: Four times a day (QID) | INTRAMUSCULAR | Status: DC | PRN
  Administered 2018-05-19: 25 ug via INTRAVENOUS
  Filled 2018-05-17: qty 2

## 2018-05-17 MED ORDER — POTASSIUM CHLORIDE 20 MEQ PO PACK
20.0000 meq | PACK | Freq: Once | ORAL | Status: AC
  Administered 2018-05-17: 20 meq via ORAL
  Filled 2018-05-17: qty 1

## 2018-05-17 NOTE — Progress Notes (Signed)
      [address]       [place] [address]             [phone]       1 Day Post-Op Procedure(s) (LRB): INSERTION PLEURAL DRAINAGE CATHETER (Right) Chest Tube Insertion (Right)  Subjective: Patient with pain at chest tube site since returning from bathroom;otherwise, no complaints.  Objective: Vital signs in last 24 hours: Temp:  [97.5 F (36.4 C)-98.1 F (36.7 C)] 98.1 F (36.7 C) (03/05 0440) Pulse Rate:  [76-95] 76 (03/05 0440) Cardiac Rhythm: Normal sinus rhythm (03/04 1938) Resp:  [12-36] 17 (03/05 0440) BP: (100-139)/(68-102) 121/78 (03/05 0440) SpO2:  [94 %-100 %] 95 % (03/05 0900) Weight:  [112.3 kg] 112.3 kg (03/04 1438)      Intake/Output from previous day: 03/04 0701 - 03/05 0700 In: 1060.7 [I.V.:1060.7] Out: 8466 [Blood:25; Chest Tube:750]   Physical Exam:  Cardiovascular: RRR Pulmonary: Clear to auscultation on left and slightly diminished right base Abdomen: Soft, non tender, bowel sounds present. Extremities: SCDs in place Wounds: Dressing is clean and dry.   Chest Tube: to water seal, no air leak  Lab Results: CBC: Recent Labs    05/15/18 1913  WBC 11.1*  HGB 17.0  HCT 53.6*  PLT 184   BMET:  Recent Labs    05/15/18 1913  NA 140  K 3.4*  CL 104  CO2 28  GLUCOSE 114*  BUN 9  CREATININE 1.28*  CALCIUM 8.8*    PT/INR: No results for input(s): LABPROT, INR in the last 72 hours. ABG:  INR: Will add last result for INR, ABG once components are confirmed Will add last 4 CBG results once components are confirmed  Assessment/Plan:  1. CV - HR in the 70's.  On Lisinopril 5 mg daily. 2.  Pulmonary - Right chest tube is to water seal and with 750 cc of output last 24 hours. Will begin draining right Pleur X catheter once chest tube removed. CXR this am shows no pneumothorax, small right pleural effusion.Check CXR in am. 3. Management per medicine  Ulices Maack M ZimmermanPA-C 05/17/2018,10:16 AM [phone]

## 2018-05-17 NOTE — Op Note (Signed)
NAME: Daniel Bryant, Daniel Bryant MEDICAL RECORD [id number] ACCOUNT [id number] DATE OF BIRTH:[birth year] FACILITY: [facility] LOCATION: [facility] PHYSICIAN:Rogenia Werntz VAN TRIGT III, MD  OPERATIVE REPORT  DATE OF PROCEDURE:  05/16/2018  OPERATION: 1.  Placement of right PleurX catheter and drainage of 2.5 liters of malignant pericardial effusion. 2.  Placement of a 51 French anterior right chest tube for post-procedure moderate pneumothorax.  SURGEON:  Tharon Aquas Trigt III, MD  PREOPERATIVE DIAGNOSIS:  Recurrent malignant right pleural effusion, history of adenocarcinoma of the right lung.  POSTOPERATIVE DIAGNOSIS:  Recurrent malignant right pleural effusion, history of adenocarcinoma of the right lung.  DESCRIPTION OF PROCEDURE:  The patient was brought from preop holding where the procedure was reviewed, informed consent was documented, and the proper site marked.  The patient overnight had developed increased difficulty breathing and required more  oxygen due to the rapid reaccumulation of the right malignant effusion.  The patient was taken back to the operating room and placed semi-erect on the operating table and was given oxygen and intravenous monitored sedation by the anesthesia team.  He was inclined further, so that the right chest and right upper abdomen could  be prepped and draped as a sterile field.  A proper time-out was performed.  Two incisions were made after local anesthesia was anesthetized in those locations which included the anterior axillary line in the 5th interspace in the midclavicular line at  the right costal margin.  Two incisions were made in the anesthetized areas.  In the upper incision, the tract was opened gently with a hemostat and more local was infiltrated down to the intercostal muscle.  Using a Seldinger technique, a guidewire was passed into the right pleural space and confirmed placement by C-arm fluoroscopy.  Next, the PleurX catheter was tunneled from the lower  incision up to the upper incision and cut to the appropriate length.  Next, the dilator, then dilator sheath was passed over the guidewire and the PleurX catheter was positioned in the pleural space.   C-arm fluoroscopy showed the catheter to be in the basilar right pleural space; however, there was a bend in the catheter and attempted drainage was not successful.  The catheter was then withdrawn.  A new catheter was placed through the 2 incisions using the same technique only the catheter was positioned in a different area of the pleural space.  Also, in the dependent portion of the right pleural space and at this time the catheter drained well  over 2 liters of fluid.  The upper incision was closed in layers using Vicryl and nylon for the skin.  The lower incision was closed with a silk suture, which was used to secure the exit site catheter.  The catheter was capped after the drainage was performed and placed under a  sterile waterproof clear dressing.  A chest x-ray was taken to the operating room because of concern that the patient could have sucked air into his right pleural space narrowing PleurX catheter insertion.  The patient was asleep, but had a very violent snoring respiratory pattern with  extremely high negative inspiratory pressures.  The chest x-ray showed a right moderate pneumothorax.  The patient was on high-flow oxygen with saturation of oxygen in the low 90s.  I felt that placing a chest tube in the operating room under anesthesia at this time would be the best course as the PleurX catheter was not able to drain out all of the air  that had been sucked into the pleural space.  While the patient was still asleep, the right anterior chest was prepped and draped again and local anesthesia was placed at the 4th interspace in the midclavicular line.  A 20 French chest tube was then positioned into the pleural space.  There was  immediate exit of air.  The catheter was secured to the  skin and then connected to a Pleur-Evac underwater drainage system.  Sterile dressing was applied.  A followup chest x-ray after chest tube placement showed complete reexpansion of the lung and chest tube in good position.  There was no significant air leak from the chest tube.  At this point, the patient was then transported back to the recovery room in stable condition.  TN/NUANCE  D:05/17/2018 T:05/17/2018 JOB:[id number]

## 2018-05-17 NOTE — Progress Notes (Addendum)
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  Since Jan this year he has had 4 Thoracentesis procedures (3 admissions and 1 done just in the ED on 2/27) with improvement after each, followed by recurrence of pleural effusion and recurrence of SOB.  Pleural fluid has shown adenocarcinoma.  Plurex catheter was recommended, but patient refused this previously.  Patient presented with recurrent pleural effusion and progressively worsening SOB. Patient admitted to hospitalist service and thoracic surgery consulted for Pleurx catheter placement.   Subjective:  Patient underwent Pleurx catheter placement yesterday by thoracic surgery complicated by development of small pneumothorax requiring chest tube placement.  Objective: Vitals:   05/17/18 0900 05/17/18 1100 05/17/18 1140 05/17/18 1200  BP:   (!) 116/59 111/73  Pulse: 71 72 72 77  Resp: 19 19 12 14   Temp:   98.1 F (36.7 C)   TempSrc:   Oral   SpO2: 95% 92% 93% 91%  Weight:      Height:        Intake/Output Summary (Last 24 hours) at 05/17/2018 1316 Last data filed at 05/17/2018 0547 Gross per 24 hour  Intake 1060.68 ml  Output 3175 ml  Net -2114.32 ml   Filed Weights   05/15/18 2227 05/16/18 1438  Weight: 112.3 kg 112.3 kg    Physical Examination:  General exam: Appears calm and comfortable  Respiratory system: Clear to auscultation except for decreased breath sounds at bases right greater than left.  Chest tube along the right chest wall and to waterseal. Respiratory effort normal. Cardiovascular system: S1 & S2 heard, RRR. No JVD, murmurs, rubs, gallops or clicks. No pedal edema. Gastrointestinal system: Abdomen is nondistended, soft and nontender. No organomegaly or masses felt. Normal bowel sounds heard. Central nervous system:  Alert and oriented. No focal neurological deficits. Extremities: Symmetric 5 x 5 power. Skin: No rashes, lesions or ulcers Psychiatry: Judgement and insight appear normal. Mood & affect appropriate.     Data Reviewed: I have personally reviewed following labs and imaging studies  CBC: Recent Labs  Lab 05/15/18 1913  WBC 11.1*  NEUTROABS 7.2  HGB 17.0  HCT 53.6*  MCV 89.2  PLT 016   Basic Metabolic Panel: Recent Labs  Lab 05/15/18 1913  NA 140  K 3.4*  CL 104  CO2 28  GLUCOSE 114*  BUN 9  CREATININE 1.28*  CALCIUM 8.8*   GFR: Estimated Creatinine Clearance: 79.4 mL/min (A) (by C-G formula based on SCr of 1.28 mg/dL (H)). Liver Function Tests: No results for input(s): AST, ALT, ALKPHOS, BILITOT, PROT, ALBUMIN in the last 168 hours. No results for input(s): LIPASE, AMYLASE in the last 168 hours. No results for input(s): AMMONIA in the last 168 hours. Coagulation Profile: No results for input(s): INR, PROTIME in the last 168 hours. Cardiac Enzymes: No results for input(s): CKTOTAL, CKMB, CKMBINDEX, TROPONINI in the last 168 hours. BNP (last 3 results) No results for input(s): PROBNP in the last 8760 hours. HbA1C: No results for input(s): HGBA1C in the last 72 hours. CBG: No results for input(s): GLUCAP in the last 168 hours. Lipid Profile: No results for input(s): CHOL, HDL, LDLCALC, TRIG, CHOLHDL, LDLDIRECT in the last 72 hours. Thyroid Function Tests: No results for input(s): TSH, T4TOTAL, FREET4, T3FREE, THYROIDAB in the last 72 hours. Anemia Panel: No results for  input(s): VITAMINB12, FOLATE, FERRITIN, TIBC, IRON, RETICCTPCT in the last 72 hours. Sepsis Labs: No results for input(s): PROCALCITON, LATICACIDVEN in the last 168 hours.  Recent Results (from the past 240 hour(s))  Surgical pcr screen     Status: None   Collection Time: 05/15/18 11:52 PM  Result Value Ref Range Status   MRSA, PCR NEGATIVE NEGATIVE Final   Staphylococcus aureus NEGATIVE  NEGATIVE Final    Comment: (NOTE) The Xpert SA Assay (FDA approved for NASAL specimens in patients 6 years of age and older), is one component of a comprehensive surveillance program. It is not intended to diagnose infection nor to guide or monitor treatment. Performed at [facility], [place] [address] [place], [place] [address]       Radiology Studies: Dg Chest 2 View  Result Date: 05/15/2018 CLINICAL DATA:  Shortness of breath for 3 weeks. Follow up malignant RIGHT pleural effusion. EXAM: CHEST - 2 VIEW COMPARISON:  Chest radiograph May 10, 2018 FINDINGS: Re-accumulation moderate to large RIGHT pleural effusion. RIGHT lung base opacity seen with atelectasis, pneumonia or mass. No mediastinal shift. LEFT lung is clear. Cardiac silhouette is obscured on the RIGHT, limiting evaluation. Mediastinal silhouette is not suspicious. No pneumothorax. RIGHT cardiac pacemaker in situ. IMPRESSION: Reaccumulation of moderate to large RIGHT pleural effusion. Electronically Signed   By: Elon Alas M.D.   On: 05/15/2018 19:47   Dg Chest Port 1 View  Result Date: 05/17/2018 CLINICAL DATA:  Chest tube in place EXAM: PORTABLE CHEST 1 VIEW COMPARISON:  05/16/2018 FINDINGS: Two chest tubes on the right unchanged in position. No pneumothorax. Right lower lobe airspace disease with mild improvement. Right pleural effusion unchanged. Left lung remains clear IMPRESSION: Two chest tubes on the right remain in place. No pneumothorax. Mild improvement in right lower lobe airspace disease. Small right effusion unchanged. Electronically Signed   By: Franchot Gallo M.D.   On: 05/17/2018 07:32   Dg Chest Portable 1 View  Result Date: 05/16/2018 CLINICAL DATA:  Malignant right pleural effusion. EXAM: PORTABLE CHEST 1 VIEW COMPARISON:  1624 hours FINDINGS: Additional right-sided chest tube is been placed with further re-expansion of the right lung and no significant residual pneumothorax identified. Basilar  PleurX catheter shows stable positioning. No significant right pleural fluid. There remains atelectasis/consolidation of the medial right lower lobe. IMPRESSION: Re-expansion of the right lung after additional right chest tube placement with no significant residual pneumothorax identified. Electronically Signed   By: Aletta Edouard M.D.   On: 05/16/2018 17:12   Dg Chest Portable 1 View  Result Date: 05/16/2018 CLINICAL DATA:  Pleural catheter placement EXAM: PORTABLE CHEST 1 VIEW COMPARISON:  05/15/2018 FINDINGS: A right tunneled pleural drain has been placed and the right pleural effusion has resolved. A right pneumothorax has developed. Overall, the amount of pleural air is less than the amount of pleural fluid seen previously and the lung is better expanded. There is no shift of the mediastinum. Lungs are under aerated with vascular crowding. Do lead right subclavian pacemaker device is stable. Upper normal heart size. IMPRESSION: Tunneled right pleural drain placed with resolution of the pleural fluid and a small ensuing basilar pneumothorax. Electronically Signed   By: Marybelle Killings M.D.   On: 05/16/2018 16:45   Dg C-arm 1-60 Min-no Report  Result Date: 05/16/2018 Fluoroscopy was utilized by the requesting physician.  No radiographic interpretation.        Scheduled Meds: . aspirin EC  81 mg Oral Daily  . atorvastatin  40 mg Oral QHS  . busPIRone  5 mg Oral BID  . cholecalciferol  1,000 Units Oral Daily  . donepezil  5 mg Oral QHS  . enoxaparin (LOVENOX) injection  40 mg Subcutaneous Q24H  . escitalopram  20 mg Oral Daily  . ketorolac  15 mg Intravenous Q6H  . levalbuterol  1.25 mg Nebulization Q6H  . lisinopril  5 mg Oral Daily  . pantoprazole  40 mg Oral Daily  . senna  1 tablet Oral BID  . sodium chloride flush  3 mL Intravenous Q12H  . vitamin B-12  500 mcg Oral Daily   Continuous Infusions: . sodium chloride    . lactated ringers 10 mL/hr at 05/16/18 1445    Assessment &  Plan:   Recurrent right malignant pleural effusion:  Pleurx catheter placement yesterday with complications of right-sided pneumothorax, now on chest tube to waterseal.  Repeat chest x-ray this a.m shows no pneumothorax, small right pleural effusion.  Management per thoracic surgery.    Hypertension/hyperlipidemia:  on lisinopril and statins  Mild AKI: creatinine peaked to 1.28 from baseline 0.8. Hold ACEI and scheduled Toradol dosing. Labs in am  Hypokalemia: replace  Adenocarcinoma of right lung: Follow with oncology on discharge.  Patient is uninsured and [facility]  COPD/sleep apnea: Neb treatments PRN.  Resume CPAP  PTSD: Resumed home medications.  GERD: PPI  DVT prophylaxis: lovenox Code Status: full code Family / Patient Communication: d/w patient  Disposition Plan: home once cleared medically     LOS: 0 days    Time spent: 35 minutes    Guilford Shi, MD [facility] Pager [phone]  If 7PM-7AM, please contact night-coverage [email or web address] Password [email or web address] 05/17/2018, 1:16 PM

## 2018-05-17 NOTE — Plan of Care (Signed)
  Problem: Health Behavior/Discharge Planning: Goal: Ability to manage health-related needs will improve Outcome: Progressing   Problem: Clinical Measurements: Goal: Respiratory complications will improve Outcome: Progressing   

## 2018-05-18 ENCOUNTER — Encounter: Payer: Self-pay | Admitting: *Deleted

## 2018-05-18 ENCOUNTER — Inpatient Hospital Stay (HOSPITAL_COMMUNITY): Payer: No Typology Code available for payment source

## 2018-05-18 DIAGNOSIS — C3491 Malignant neoplasm of unspecified part of right bronchus or lung: Secondary | ICD-10-CM

## 2018-05-18 LAB — BASIC METABOLIC PANEL
Anion gap: 8 (ref 5–15)
BUN: 10 mg/dL (ref 6–20)
CO2: 30 mmol/L (ref 22–32)
Calcium: 8.6 mg/dL — ABNORMAL LOW (ref 8.9–10.3)
Chloride: 101 mmol/L (ref 98–111)
Creatinine, Ser: 1.19 mg/dL (ref 0.61–1.24)
GFR calc Af Amer: >60 mL/min (ref >60)
GFR calc non Af Amer: >60 mL/min (ref >60)
GLUCOSE: 100 mg/dL — AB (ref 70–99)
POTASSIUM: 4.2 mmol/L (ref 3.5–5.1)
Sodium: 139 mmol/L (ref 135–145)

## 2018-05-18 MED ORDER — LEVALBUTEROL HCL 1.25 MG/0.5ML IN NEBU
1.2500 mg | INHALATION_SOLUTION | Freq: Three times a day (TID) | RESPIRATORY_TRACT | Status: DC
  Administered 2018-05-18 – 2018-05-21 (×10): 1.25 mg via RESPIRATORY_TRACT
  Filled 2018-05-18 (×10): qty 0.5

## 2018-05-18 NOTE — Progress Notes (Signed)
Oncology Nurse Navigator Documentation  Oncology Nurse Navigator Flowsheets 05/18/2018  Navigator Location [facility]  Referral date to RadOnc/MedOnc 05/17/2018  Navigator Encounter Type Other/I received referral on Daniel Bryant.  Patient is in the hospital.  I called the nurse taking care of him on $E today.  I update her on appt with Dr. Julien Nordmann on 05/25/2018.  She will update patient.    Patient Visit Type Inpatient  Treatment Phase Pre-Tx/Tx Discussion  Barriers/Navigation Needs Education;Coordination of Care  Education Other  Interventions Coordination of Care;Education  Coordination of Care Appts  Education Method Verbal  Acuity Level 2  Time Spent with Patient 30

## 2018-05-18 NOTE — Discharge Summary (Addendum)
Physician Discharge Summary  Patient ID: Daniel Bryant MRN: [id number] DOB/AGE: 1960/09/19 58 y.o.  Admit date: 05/15/2018 Discharge date: 05/21/2018  Admission Diagnoses:  Adenocarcinoma of the right lung Recurrent, symptomatic, malignant right pleural effusion Chronic obstructive pulmonary disease Gastroesophageal reflux disease Dyslipidemia Hypertension Obstructive sleep apnea Sick sinus syndrome, status post placement of a  permanent pacemaker History of seizure disorder  Discharge Diagnoses:  Adenocarcinoma of the right lung Recurrent, symptomatic, malignant right pleural effusion Chronic obstructive pulmonary disease Gastroesophageal reflux disease Dyslipidemia Hypertension Obstructive sleep apnea Sick sinus syndrome, status post placement of a  permanent pacemaker History of seizure disorder   Discharged Condition: stable  History of present illness: Daniel Bryant is a 58 year old male with the history of known adenocarcinoma of the right lung.  He has had 4 right thoracenteses for recurrent right pleural effusion since January of this year.  Fluid cytology has shown adenocarcinoma.  A Pleurx catheter has previously been commended but the patient declined.  He presented to the to the [place] clinic in [place] on 05/15/2018 with a complaint of recurrent, worsening dyspnea.  He was told he could not treat him and sent him to the [facility] emergency room by ambulance for management.  In the emergency room at this facility, chest x-ray confirmed the presence of a large right pleural effusion.  We were asked to evaluate Daniel Bryant and assist in managing his recurrent pleural effusion.  Hospital Course: Daniel Bryant remained stable after being admitted by the internal medicine service.  He was taken to the operative room on 05/16/2018 where right Pleurx catheter was placed.  During the course of the procedure, he had a vigorous cough that precipitated a small right pneumothorax.  For this  reason, a right pleural tube was placed and connected to a Pleur-evac.  T follow-up chest x-ray showed the pneumothorax had resolved and he had no active air leak.  Chest tube was placed on waterseal on the first postoperative day.  It drained approximately 450 mL over 24 hours.  Chest tube was removed on the second postoperative day. The PleurX catheter was drained on 05/19/18 448ml and 05/21/18 532ml. We originally planned for discharge on 05/19/18 but we were unable to get a home health agency to commit to carring for Daniel Bryant PleurX catheter in the home setting. This was imperative since he lives alone and has no personal transportation. Proper arrangements were made prior to his discharge. A referral was made to the oncology service.  The oncology nurse noted in the EMR that a follow-up appointment had been made for the patient with Dr. Julien Nordmann on 05/25/2018 at 9:30am.  Consults: Oncology referral, out-patient evaluation planned.  Significant Diagnostic Studies:  CLINICAL DATA:  Shortness of breath. Malignant pleural effusion. Thoracentesis this afternoon. Past medical history of COPD. Metastatic adenocarcinoma on cytology from 04/05/2018. Most consistent with primary lung adenocarcinoma.  EXAM: CT CHEST, ABDOMEN, AND PELVIS WITH CONTRAST  TECHNIQUE: Multidetector CT imaging of the chest, abdomen and pelvis was performed following the standard protocol during bolus administration of intravenous contrast.  CONTRAST:  139mL ISOVUE-300 IOPAMIDOL (ISOVUE-300) INJECTION 61%, 55mL OMNIPAQUE IOHEXOL 300 MG/ML SOLN  COMPARISON:  Chest radiograph 05/01/2018.  Today's H and P.  FINDINGS: CT CHEST FINDINGS  Cardiovascular: Dual lead pacer. Tortuous thoracic aorta. Normal heart size, without pericardial effusion. No central pulmonary embolism, on this non-dedicated study. Lad coronary artery calcification  Mediastinum/Nodes: No supraclavicular adenopathy.  Right paratracheal  adenopathy at 1.8 cm on image 18/2.  Borderline right  hilar adenopathy at 1.3 cm. Right juxta cardiac 9 mm enlarged node on image 33/2.  Lungs/Pleura: A small to moderate right-sided pleural effusion. Areas of pleural and subpleural irregularity, including at 1.2 cm on image 46/2, consistent with malignant pleural disease. No right-sided effusion.  Patent airways. Scattered areas of right-sided volume loss and subsegmental atelectasis.  Slightly irregular pleural-based right upper lobe pulmonary nodule measures 1.0 x 1.0 cm, including on image 44/4. An adjacent satellite nodule of 5 mm on image 48/4.  Right lower lobe collapse/consolidation with air bronchograms, favored to represent compressive atelectasis.  Apparent nodularity within the right major fissure likely represents pleural-based metastasis, including at 2.2 cm on image 76/4.  Musculoskeletal: No acute osseous abnormality.  CT ABDOMEN PELVIS FINDINGS  Hepatobiliary: High right hepatic lobe cyst. Hepatomegaly at 19.5 cm. Normal gallbladder, without biliary ductal dilatation.  Pancreas: Normal, without mass or ductal dilatation.  Spleen: Normal in size, without focal abnormality.  Adrenals/Urinary Tract: Normal adrenal glands. Too small to characterize lower pole right renal lesion. Normal left kidney. Normal urinary bladder.  Stomach/Bowel: Normal stomach, without wall thickening. Normal colon, appendix, and terminal ileum. Normal small bowel.  Vascular/Lymphatic: Aortic atherosclerosis. No abdominopelvic adenopathy.  Reproductive: Normal prostate.  Other: No significant free fluid. No evidence of omental or peritoneal disease.  Musculoskeletal: Scattered tiny sclerotic lesions throughout the pelvis are most likely bone islands. Similar findings within the lumbar spine.  IMPRESSION: 1. Right-sided pleural and thoracic nodal metastasis. The primary is presumably a spiculated anterior  right upper lobe pleural based nodule. 2. Small to moderate right pleural effusion with secondary volume loss in the right lower lobe. 3. No findings of metastatic disease in the abdomen or pelvis. 4. Coronary artery atherosclerosis. Aortic Atherosclerosis (ICD10-I70.0). 5. Hepatomegaly.   Electronically Signed   By: Abigail Miyamoto M.D.   On: 05/01/2018 20:59   Treatments: Surgery PATIENT:  Daniel Bryant  58 y.o. male  PRE-OPERATIVE DIAGNOSIS:  pleural effusion, malignant  POST-OPERATIVE DIAGNOSIS:  pleural effusion, malignant, basilar R pneumothorax  PROCEDURE:  Procedure(s): INSERTION PLEURAL DRAINAGE CATHETER (Right) Chest Tube Insertion (Right) Drainage 2.5 L R pleural fluid  Patient had a moderate right basilar pneumothorax after drainage of the effusion due to suction of air through the Pleurx catheter as it was being inserted and he became agitated with vigorous tachypnea.  Because his O2 saturation was borderline on fairly high-dose oxygen in the operating room I felt that a chest tube to resolve the pneumothorax would be beneficial and this was placed under local anesthesia while the patient was still receiving conscious monitored sedation by anesthesia.  A chest x-ray was taken following tube insertion showing resolution of pneumothorax.  There is no significant air leak through the chest tube.   Discharge Exam: Blood pressure (!) 145/86, pulse 78, temperature 98.1 F (36.7 C), temperature source Oral, resp. rate 18, height 6' (1.829 m), weight 108.5 kg, SpO2 99 %.  General appearance:alert, cooperative and mild distress Heart:regular rate and rhythm Chest:breath soundsclear to auscultation bilaterally, diminished on right.. No subQ air. Wound:The Pleurx catheter is covered with a clean,dry dressing.  Disposition:    Allergies as of 05/21/2018   No Known Allergies     Medication List    STOP taking these medications   nicotine 21 mg/24hr  patch Commonly known as:  NICODERM CQ - dosed in mg/24 hours     TAKE these medications   acetaminophen 325 MG tablet Commonly known as:  TYLENOL Take 2 tablets (650 mg  total) by mouth every 6 (six) hours as needed for mild pain or moderate pain (or Fever >/= 101).   albuterol (2.5 MG/3ML) 0.083% nebulizer solution Commonly known as:  PROVENTIL Take 3 mLs (2.5 mg total) by nebulization 2 (two) times daily as needed for wheezing or shortness of breath.   aspirin 81 MG EC tablet Take 1 tablet (81 mg total) by mouth daily. Start taking on:  May 22, 2018   atorvastatin 40 MG tablet Commonly known as:  LIPITOR Take 40 mg by mouth at bedtime.   donepezil 5 MG tablet Commonly known as:  ARICEPT Take 5 mg by mouth at bedtime.   escitalopram 20 MG tablet Commonly known as:  LEXAPRO Take 20 mg by mouth daily.   lisinopril 5 MG tablet Commonly known as:  PRINIVIL,ZESTRIL Take 5 mg by mouth daily.   oxyCODONE-acetaminophen 5-325 MG tablet Commonly known as:  Percocet Take 1 tablet by mouth every 8 (eight) hours as needed for up to 4 days for severe pain.   pantoprazole 40 MG tablet Commonly known as:  PROTONIX Take 1 tablet (40 mg total) by mouth daily.   vitamin B-12 500 MCG tablet Commonly known as:  CYANOCOBALAMIN Take 500 mcg by mouth daily.   Vitamin D3 25 MCG (1000 UT) Caps Take 1,000 Units by mouth daily.      Follow-up Information    [facility]. Go on 05/25/2018.   Specialty:  Cardiothoracic Surgery Why:  You have an appointment to Dr. Lucianne Lei Trigt's office on Thursday, May 24, 2018 at 10:00am AM for suture removal. Contact information: [place], [place]       Curt Bears, MD. Go on 05/25/2018.   Specialty:  Oncology Why:  You have and appointment with Dr. Julien Nordmann on Friday March 13 at 9:30 am. Contact information: [place] [address] [phone]           Signed: Antony Odea, PA-C 05/21/2018, 12:14 PM   patient examined and medical record reviewed,agree with above note. Tharon Aquas Trigt III 05/28/2018

## 2018-05-18 NOTE — Progress Notes (Addendum)
2 Days Post-Op Procedure(s) (LRB): INSERTION PLEURAL DRAINAGE CATHETER (Right) Chest Tube Insertion (Right) Subjective: Awake and alert.  Having some soreness at the chest tube insertion site but otherwise feels well. Denies shortness of breath.  Objective: Vital signs in last 24 hours: Temp:  [97.6 F (36.4 C)-98.8 F (37.1 C)] 98.4 F (36.9 C) (03/06 0623) Pulse Rate:  [71-83] 72 (03/06 0806) Cardiac Rhythm: Normal sinus rhythm (03/05 1908) Resp:  [12-23] 17 (03/06 0623) BP: (89-140)/(59-91) 140/91 (03/06 0623) SpO2:  [91 %-98 %] 94 % (03/06 0623) Weight:  [108.5 kg] 108.5 kg (03/06 0648)     Intake/Output from previous day: 03/05 0701 - 03/06 0700 In: 822.7 [P.O.:720; I.V.:102.7] Out: 570 [Chest Tube:570] Intake/Output this shift: No intake/output data recorded.  General appearance: alert, cooperative and mild distress Heart: regular rate and rhythm Chest: breath sounds clear to auscultation bilaterally.  No subQ air. Wound: Right-sided chest tube is well secured.  Chest tube drained a total of 570 mL's over the past 24 hours.  This is clear, serous fluid.  There was no air leak.  The Pleurx catheter is covered with a dry dressing.  Lab Results: Recent Labs    05/15/18 1913  WBC 11.1*  HGB 17.0  HCT 53.6*  PLT 184   BMET:  Recent Labs    05/15/18 1913 05/18/18 0703  NA 140 139  K 3.4* 4.2  CL 104 101  CO2 28 30  GLUCOSE 114* 100*  BUN 9 10  CREATININE 1.28* 1.19  CALCIUM 8.8* 8.6*    PT/INR: No results for input(s): LABPROT, INR in the last 72 hours. ABG    Component Value Date/Time   PHART 7.452 (H) 05/16/2018 0050   HCO3 25.7 05/16/2018 0050   ACIDBASEDEF 2.6 (H) 05/01/2018 2029   O2SAT 95.0 05/16/2018 0050   CBG (last 3)  No results for input(s): GLUCAP in the last 72 hours.  Assessment/Plan: S/P Procedure(s) (LRB): INSERTION PLEURAL DRAINAGE CATHETER (Right) Chest Tube Insertion (Right)  -POD2 placement of a right PleurX catheter for  recurrent malignant pleural effusion and placement of a right chest tube for acute pneumothorax.  CXR shows no pneumothorax and he has never had an active air leak.  Will remove the right chest tube.  Plan to drain the PleurX catheter tomorrow prior to discharge and arrange for Home Health nursing to assist with drainage 3x weekly.    LOS: 1 day    Antony Odea, [place] [phone] 05/18/2018   Chart reviewed, patient examined, agree with above. He feels ok with chest tube out but feels like pleural effusion is increasing. Plan to drain PleurX in am and send home with [facility] to drain at home 3X per week. He has appt with Dr. Julien Nordmann on 3/13.

## 2018-05-18 NOTE — Progress Notes (Signed)
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  Since Jan this year he has had 4 Thoracentesis procedures (3 admissions and 1 done just in the ED on 2/27) with improvement after each, followed by recurrence of pleural effusion and recurrence of SOB.  Pleural fluid has shown adenocarcinoma.  Plurex catheter was recommended, but patient refused this previously.  Patient presented with recurrent pleural effusion and progressively worsening SOB. Patient admitted to hospitalist service and thoracic surgery consulted for Pleurx catheter placement.Patient underwent Pleurx catheter placement on 3/4  by thoracic surgery complicated by development of small pneumothorax requiring chest tube placement.    Subjective: Chest tube removal anticipated today per patient.  He is complaining of pain at chest tube site.  He reports setting up oncology appointment with the [facility] on the 21st but also states would prefer seeing a community oncologist.  Objective: Vitals:   05/18/18 7017 05/18/18 0806 05/18/18 1215 05/18/18 1351  BP:      Pulse:  72 77   Resp:   15   Temp:      TempSrc:      SpO2:   95% 99%  Weight: 108.5 kg     Height:        Intake/Output Summary (Last 24 hours) at 05/18/2018 1800 Last data filed at 05/18/2018 1355 Gross per 24 hour  Intake 480 ml  Output 1170 ml  Net -690 ml   Filed Weights   05/15/18 2227 05/16/18 1438 05/18/18 0648  Weight: 112.3 kg 112.3 kg 108.5 kg    Physical Examination:  General exam: Appears calm and comfortable  Respiratory system: Clear to auscultation except for decreased breath sounds at bases right greater than left.  Chest tube along the right chest wall and s/p rt pleurx catheter . Respiratory effort normal. Cardiovascular system: S1 & S2 heard, RRR. No JVD,  murmurs, rubs, gallops or clicks. No pedal edema. Gastrointestinal system: Abdomen is nondistended, soft and nontender. No organomegaly or masses felt. Normal bowel sounds heard. Central nervous system: Alert and oriented. No focal neurological deficits. Extremities: Symmetric 5 x 5 power. Skin: No rashes, lesions or ulcers Psychiatry: Judgement and insight appear normal. Mood & affect appropriate.     Data Reviewed: I have personally reviewed following labs and imaging studies  CBC: Recent Labs  Lab 05/15/18 1913  WBC 11.1*  NEUTROABS 7.2  HGB 17.0  HCT 53.6*  MCV 89.2  PLT 793   Basic Metabolic Panel: Recent Labs  Lab 05/15/18 1913 05/18/18 0703  NA 140 139  K 3.4* 4.2  CL 104 101  CO2 28 30  GLUCOSE 114* 100*  BUN 9 10  CREATININE 1.28* 1.19  CALCIUM 8.8* 8.6*   GFR: Estimated Creatinine Clearance: 84 mL/min (by C-G formula based on SCr of 1.19 mg/dL). Liver Function Tests: No results for input(s): AST, ALT, ALKPHOS, BILITOT, PROT, ALBUMIN in the last 168 hours. No results for input(s): LIPASE, AMYLASE in the last 168 hours. No results for input(s): AMMONIA in the last 168 hours. Coagulation Profile: No results for input(s): INR, PROTIME in the last 168 hours. Cardiac Enzymes: No results for input(s): CKTOTAL, CKMB, CKMBINDEX, TROPONINI in the last 168 hours. BNP (last 3 results) No results for input(s): PROBNP in the last 8760 hours. HbA1C: No results for input(s): HGBA1C in the last  72 hours. CBG: No results for input(s): GLUCAP in the last 168 hours. Lipid Profile: No results for input(s): CHOL, HDL, LDLCALC, TRIG, CHOLHDL, LDLDIRECT in the last 72 hours. Thyroid Function Tests: No results for input(s): TSH, T4TOTAL, FREET4, T3FREE, THYROIDAB in the last 72 hours. Anemia Panel: No results for input(s): VITAMINB12, FOLATE, FERRITIN, TIBC, IRON, RETICCTPCT in the last 72 hours. Sepsis Labs: No results for input(s): PROCALCITON, LATICACIDVEN in the last  168 hours.  Recent Results (from the past 240 hour(s))  Surgical pcr screen     Status: None   Collection Time: 05/15/18 11:52 PM  Result Value Ref Range Status   MRSA, PCR NEGATIVE NEGATIVE Final   Staphylococcus aureus NEGATIVE NEGATIVE Final    Comment: (NOTE) The Xpert SA Assay (FDA approved for NASAL specimens in patients 72 years of age and older), is one component of a comprehensive surveillance program. It is not intended to diagnose infection nor to guide or monitor treatment. Performed at [facility], [place] [address], [place], [place] [address]       Radiology Studies: Dg Chest Port 1 View  Result Date: 05/18/2018 CLINICAL DATA:  Chest tube EXAM: PORTABLE CHEST 1 VIEW COMPARISON:  05/17/2018 FINDINGS: Right chest tubes remain in place, unchanged. No pneumothorax. Stable right pacer. Cardiomegaly. Patchy airspace disease in the right lower lobe with small right pleural effusion or pleural thickening. No confluent opacity on the left. No acute bony abnormality. IMPRESSION: No significant change.  No pneumothorax. Electronically Signed   By: Rolm Baptise M.D.   On: 05/18/2018 08:07   Dg Chest Port 1 View  Result Date: 05/17/2018 CLINICAL DATA:  Chest tube in place EXAM: PORTABLE CHEST 1 VIEW COMPARISON:  05/16/2018 FINDINGS: Two chest tubes on the right unchanged in position. No pneumothorax. Right lower lobe airspace disease with mild improvement. Right pleural effusion unchanged. Left lung remains clear IMPRESSION: Two chest tubes on the right remain in place. No pneumothorax. Mild improvement in right lower lobe airspace disease. Small right effusion unchanged. Electronically Signed   By: Franchot Gallo M.D.   On: 05/17/2018 07:32        Scheduled Meds: . aspirin EC  81 mg Oral Daily  . atorvastatin  40 mg Oral QHS  . busPIRone  5 mg Oral BID  . cholecalciferol  1,000 Units Oral Daily  . donepezil  5 mg Oral QHS  . enoxaparin (LOVENOX) injection  40 mg  Subcutaneous Q24H  . escitalopram  20 mg Oral Daily  . levalbuterol  1.25 mg Nebulization TID  . pantoprazole  40 mg Oral Daily  . senna  1 tablet Oral BID  . sodium chloride flush  3 mL Intravenous Q12H  . vitamin B-12  500 mcg Oral Daily   Continuous Infusions: . sodium chloride    . lactated ringers 10 mL/hr at 05/16/18 1445    Assessment & Plan:   Recurrent right malignant pleural effusion:  Pleurx catheter placement 3/4 with complications of right-sided pneumothorax, now on chest tube to waterseal.  Plan to be discharged home on Pleurx catheter.  Patient states he will probably need to self manage the catheter as he does not have any family members or friends to help him.  Management per thoracic surgery.    Hypertension/hyperlipidemia: on lisinopril and statins  Mild AKI: creatinine peaked to 1.28 from baseline 0.8.  Discontinued ACEI and scheduled Toradol dosing. Labs today show some improvement.  Hypokalemia: replaced  Adenocarcinoma of right lung: Follow with oncology  on discharge ( appointment set up with Dr. Julien Nordmann on March 13 through [place] [facility] per documentation).  Patient is uninsured and follows the [place]  COPD/sleep apnea:Neb treatments PRN.  Resume CPAP  PTSD: Resumed home medications.  GERD: PPI  DVT prophylaxis: lovenox Code Status: full code Family / Patient Communication: d/w patient  Disposition Plan: home once cleared medically     LOS: 1 day    Time spent: 25 minutes    Gu[place]ford Shi, MD [facility] Pager [phone]  If 7PM-7AM, please contact night-coverage [email or web address] Password [email or web address] 05/18/2018, 6:00 PM

## 2018-05-18 NOTE — Care Management Note (Signed)
Case Management Note Marvetta Gibbons RN, BSN Transitions of Care Unit 4E- RN Case Manager [phone]  Patient Details  Name: Daniel Bryant MRN: [id number] Date of Birth: 02/03/1961  Subjective/Objective:    Pt admitted with recurrent Pl. Effusion, s/p pleurx Cath drain, with pntx and CT placement                Action/Plan: PTA pt lived at home alone, noted pt will need HH for pleurX cath needs on transition home. Pt will need HHRN orders with drainage orders prior to discharge . PleurX forms have been signed by MD and started, on shadow chart - will need to be completed and faxed to [place] (and original mailed)- Pt is followed at the [facility] clinic for PCP needs- Dr. Collins Scotland, with CSW at the [facility]- contact # [phone] ext. [phone] (pager [phone]). CM spoke with pt at bedside regarding [place] needs at discharge for PleurX - pt states he does not really have anyone to assist him, has neighbors that "look in on him" but not sure any of them would be willing to help him with this. Pt states he is willing to use his Medicaid benefits to start [facility] services until [place] can be reached to see if they will be setting up [facility] for ongoing pleurX needs. Choice offered Per CMS guidelines from medicare.gov website with star ratings (copy placed in shadow chart)- pt states he does not have a preference. Calls made to all agencies on this list- and at this time no agency able to accept referral for pleurX needs either due to unable to take Medicaid or do not take pleurX caths. May need to wait to hear from [place] before transition home to secure assistance. CM has called CSW at the [place] this am and left message but as of this time have not received a call back regarding d/c needs. Pt will need box of catheter drainage kits to send home with him once [facility] agency found that can accept referral.   Expected Discharge Date:                  Expected Discharge Plan:  [place]  In-House Referral:     Discharge planning Services  CM Consult  Post Acute Care Choice:  Durable Medical Equipment Choice offered to:  Patient  DME Arranged:  Other see comment DME Agency:  Other - Comment  HH Arranged:  RN [place] Agency:     Status of Service:  In process, will continue to follow  If discussed at Long Length of Stay Meetings, dates discussed:    Discharge Disposition:   Additional Comments:  [name], RN 05/18/2018, 4:44 PM

## 2018-05-19 ENCOUNTER — Inpatient Hospital Stay (HOSPITAL_COMMUNITY): Payer: No Typology Code available for payment source

## 2018-05-19 NOTE — Progress Notes (Signed)
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  Since Jan this year he has had 4 Thoracentesis procedures (3 admissions and 1 done just in the ED on 2/27) with improvement after each, followed by recurrence of pleural effusion and recurrence of SOB.  Pleural fluid has shown adenocarcinoma.  Plurex catheter was recommended, but patient refused this previously.  Patient presented with recurrent pleural effusion and progressively worsening SOB. Patient admitted to hospitalist service and thoracic surgery consulted for Pleurx catheter placement.  05/19/2018: Patient is awaiting disposition.  Patient is clinically stable for discharge.  Apparently, barrier to discharge is been able to find home health agency that will look after patient's Pleurx catheter.  No new complaints.   Subjective: No new complaints. Shortness of breath.   No chest pain.  Objective: Vitals:   05/19/18 0423 05/19/18 0429 05/19/18 0903 05/19/18 1306  BP:  135/70  (!) 138/98  Pulse:  76 75 95  Resp:  19 14 (!) 21  Temp: 97.6 F (36.4 C)   98 F (36.7 C)  TempSrc: Oral   Oral  SpO2:  94% 96% 92%  Weight:      Height:       No intake or output data in the 24 hours ending 05/19/18 1424 Filed Weights   05/15/18 2227 05/16/18 1438 05/18/18 0648  Weight: 112.3 kg 112.3 kg 108.5 kg    Physical Examination:  General exam: Appears calm and comfortable  Respiratory system: Decreased air entry right lung base posteriorly.  Pleurx catheter in place and covered.   Cardiovascular system: S1 & S2 heard. No pedal edema. Gastrointestinal system: Abdomen is nondistended, soft and nontender. No organomegaly or masses felt. Normal bowel sounds heard. Central nervous system: Alert and oriented.  Patient moves all limbs.   Extremities:  No leg edema.  Data Reviewed: I have personally reviewed following labs and imaging studies  CBC: Recent Labs  Lab 05/15/18 1913  WBC 11.1*  NEUTROABS 7.2  HGB 17.0  HCT 53.6*  MCV 89.2  PLT 016   Basic Metabolic Panel: Recent Labs  Lab 05/15/18 1913 05/18/18 0703  NA 140 139  K 3.4* 4.2  CL 104 101  CO2 28 30  GLUCOSE 114* 100*  BUN 9 10  CREATININE 1.28* 1.19  CALCIUM 8.8* 8.6*   GFR: Estimated Creatinine Clearance: 84 mL/min (by C-G formula based on SCr of 1.19 mg/dL). Liver Function Tests: No results for input(s): AST, ALT, ALKPHOS, BILITOT, PROT, ALBUMIN in the last 168 hours. No results for input(s): LIPASE, AMYLASE in the last 168 hours. No results for input(s): AMMONIA in the last 168 hours. Coagulation Profile: No results for input(s): INR, PROTIME in the last 168 hours. Cardiac Enzymes: No results for input(s): CKTOTAL, CKMB, CKMBINDEX, TROPONINI in the last 168 hours. BNP (last 3 results) No results for input(s): PROBNP in the last 8760 hours. HbA1C: No results for input(s): HGBA1C in the last 72 hours. CBG: No results for input(s): GLUCAP in the last 168 hours. Lipid Profile: No results for input(s): CHOL, HDL, LDLCALC, TRIG, CHOLHDL, LDLDIRECT in the last 72 hours. Thyroid Function Tests: No results for input(s): TSH, T4TOTAL, FREET4, T3FREE, THYROIDAB in the last 72 hours. Anemia Panel: No results for input(s): VITAMINB12, FOLATE, FERRITIN, TIBC, IRON, RETICCTPCT in the last 72 hours. Sepsis Labs: No  results for input(s): PROCALCITON, LATICACIDVEN in the last 168 hours.  Recent Results (from the past 240 hour(s))  Surgical pcr screen     Status: None   Collection Time: 05/15/18 11:52 PM  Result Value Ref Range Status   MRSA, PCR NEGATIVE NEGATIVE Final   Staphylococcus aureus NEGATIVE NEGATIVE Final    Comment: (NOTE) The Xpert SA Assay (FDA approved for NASAL specimens in patients 14 years of age and older), is one component of a  comprehensive surveillance program. It is not intended to diagnose infection nor to guide or monitor treatment. Performed at [facility], [place] [address], [place], [place] [address]       Radiology Studies: Dg Chest Port 1 View  Result Date: 05/19/2018 CLINICAL DATA:  Follow-up pneumothorax EXAM: PORTABLE CHEST 1 VIEW COMPARISON:  05/18/2018 FINDINGS: Cardiac shadows is stable. Pacing device is again seen. Right-sided chest tube has been removed in the interval. Residual inferior chest tube remains on the right. No sizable pneumothorax is seen. Considerable pleural thickening is noted laterally stable from the previous exam. The left lung is clear. IMPRESSION: Interval removal of 1 of the 2 right chest tubes. No significant recurrent pneumothorax is seen. Stable pleural thickening is noted on the right. Electronically Signed   By: Inez Catalina M.D.   On: 05/19/2018 08:46   Dg Chest Port 1 View  Result Date: 05/18/2018 CLINICAL DATA:  Chest tube EXAM: PORTABLE CHEST 1 VIEW COMPARISON:  05/17/2018 FINDINGS: Right chest tubes remain in place, unchanged. No pneumothorax. Stable right pacer. Cardiomegaly. Patchy airspace disease in the right lower lobe with small right pleural effusion or pleural thickening. No confluent opacity on the left. No acute bony abnormality. IMPRESSION: No significant change.  No pneumothorax. Electronically Signed   By: Rolm Baptise M.D.   On: 05/18/2018 08:07        Scheduled Meds: . aspirin EC  81 mg Oral Daily  . atorvastatin  40 mg Oral QHS  . busPIRone  5 mg Oral BID  . cholecalciferol  1,000 Units Oral Daily  . donepezil  5 mg Oral QHS  . enoxaparin (LOVENOX) injection  40 mg Subcutaneous Q24H  . escitalopram  20 mg Oral Daily  . levalbuterol  1.25 mg Nebulization TID  . pantoprazole  40 mg Oral Daily  . senna  1 tablet Oral BID  . sodium chloride flush  3 mL Intravenous Q12H  . vitamin B-12  500 mcg Oral Daily   Continuous Infusions: . sodium  chloride    . lactated ringers 10 mL/hr at 05/16/18 1445    Assessment & Plan:   Recurrent right malignant pleural effusion:  Pleurx catheter placement yesterday with complications of right-sided pneumothorax, now on chest tube to waterseal.  Repeat chest x-ray this a.m shows no pneumothorax, small right pleural effusion.  Management per thoracic surgery.    Hypertension/hyperlipidemia:  on lisinopril and statins  Mild AKI: creatinine peaked to 1.28 from baseline 0.8. Hold ACEI and scheduled Toradol dosing. Labs in am  Hypokalemia: replace  Adenocarcinoma of right lung: Follow with oncology on discharge.  [facility] is [facility]  COPD/sleep apnea: Neb treatments PRN.  Resume CPAP  PTSD: Resumed home medications.  GERD: PPI  DVT prophylaxis: lovenox Code Status: full code Family / Patient Communication: d/w patient  Disposition Plan: home once cleared medically     LOS: 2 days    Time spent: 25 minutes    Babs Bertin  Marthenia Rolling, MD [facility] Pager [phone]  If 7PM-7AM, please contact night-coverage [email or web address] Password [facility] 05/19/2018, 2:24 PM

## 2018-05-19 NOTE — Progress Notes (Addendum)
3 Days Post-Op Procedure(s) (LRB): INSERTION PLEURAL DRAINAGE CATHETER (Right) Chest Tube Insertion (Right) Subjective: No new problems.  He agrees with plans for discharged home today.  Objective: Vital signs in last 24 hours: Temp:  [97.6 F (36.4 C)] 97.6 F (36.4 C) (03/07 0423) Pulse Rate:  [75-80] 75 (03/07 0903) Cardiac Rhythm: Normal sinus rhythm (03/07 0712) Resp:  [14-19] 14 (03/07 0903) BP: (124-135)/(70-85) 135/70 (03/07 0429) SpO2:  [94 %-99 %] 96 % (03/07 0903)     Intake/Output from previous day: 03/06 0701 - 03/07 0700 In: -  Out: 1000 [Urine:720; Chest Tube:280] Intake/Output this shift: No intake/output data recorded.  General appearance: alert, cooperative and mild distress Heart: regular rate and rhythm Chest: breath sounds clear to auscultation bilaterally.  No subQ air. Wound: Right-sided chest tube has been removed.  Site is dry.   The Pleurx catheter is covered with a dry dressing.  Lab Results: No results for input(s): WBC, HGB, HCT, PLT in the last 72 hours. BMET:  Recent Labs    05/18/18 0703  NA 139  K 4.2  CL 101  CO2 30  GLUCOSE 100*  BUN 10  CREATININE 1.19  CALCIUM 8.6*    PT/INR: No results for input(s): LABPROT, INR in the last 72 hours. ABG    Component Value Date/Time   PHART 7.452 (H) 05/16/2018 0050   HCO3 25.7 05/16/2018 0050   ACIDBASEDEF 2.6 (H) 05/01/2018 2029   O2SAT 95.0 05/16/2018 0050   CBG (last 3)  No results for input(s): GLUCAP in the last 72 hours.  Assessment/Plan: S/P Procedure(s) (LRB): INSERTION PLEURAL DRAINAGE CATHETER (Right) Chest Tube Insertion (Right)   -POD3 placement of a right PleurX catheter for recurrent malignant pleural effusion and placement of a right chest tube for acute pneumothorax.  CXR  is stable following removal of the chest tube.   Plan to drain the PleurX catheter today prior to discharge and arrange for Home Health nursing to assist with drainage 3x weekly.  AS of yesterday  afternoon, a home health agency has not committed to assuming care of Daniel Bryant PleurX in the home setting.  We will continue care here with drainage every other day an as needed.   LOS: 2 days    Antony Odea, PA-C [phone] 05/19/2018  I have seen and examined the patient and agree with the assessment and plan as outlined.  Rexene Alberts, MD 05/19/2018 6:03 PM

## 2018-05-19 NOTE — Progress Notes (Signed)
PleurX cath drain completed w/o difficulty.  450cc of yellow clear drainage was removed with several small clots.

## 2018-05-20 ENCOUNTER — Inpatient Hospital Stay (HOSPITAL_COMMUNITY): Payer: No Typology Code available for payment source

## 2018-05-20 NOTE — Progress Notes (Addendum)
4 Days Post-Op Procedure(s) (LRB): INSERTION PLEURAL DRAINAGE CATHETER (Right) Chest Tube Insertion (Right) Subjective: Resting comfortably.  No new problems. Pleurx drained 450 mL of serous fluid yesterday.  This improved his breathing but he said he thinks he can sense the fluid is reaccumulating.  Objective: Vital signs in last 24 hours: Temp:  [97.9 F (36.6 C)-98.1 F (36.7 C)] 98.1 F (36.7 C) (03/08 0511) Pulse Rate:  [70-95] 70 (03/08 0511) Cardiac Rhythm: Normal sinus rhythm (03/08 0700) Resp:  [15-21] 15 (03/08 0511) BP: (124-138)/(83-98) 127/83 (03/08 0511) SpO2:  [92 %-95 %] 94 % (03/08 0906)     Intake/Output from previous day: No intake/output data recorded. Intake/Output this shift: No intake/output data recorded.  General appearance:alert, cooperative and mild distress Heart:regular rate and rhythm Chest:breath soundsclear to auscultation bilaterally. No subQ air. Wound: The Pleurx catheter is covered with a clean, dry dressing.  Lab Results: No results for input(s): WBC, HGB, HCT, PLT in the last 72 hours. BMET:  Recent Labs    05/18/18 0703  NA 139  K 4.2  CL 101  CO2 30  GLUCOSE 100*  BUN 10  CREATININE 1.19  CALCIUM 8.6*    PT/INR: No results for input(s): LABPROT, INR in the last 72 hours. ABG    Component Value Date/Time   PHART 7.452 (H) 05/16/2018 0050   HCO3 25.7 05/16/2018 0050   ACIDBASEDEF 2.6 (H) 05/01/2018 2029   O2SAT 95.0 05/16/2018 0050   CBG (last 3)  No results for input(s): GLUCAP in the last 72 hours.  Assessment/Plan: S/P Procedure(s) (LRB): INSERTION PLEURAL DRAINAGE CATHETER (Right) Chest Tube Insertion (Right)  -POD4 placement of a right PleurX catheter for recurrent malignant pleural effusion and placement of a right chest tube for acute pneumothorax. CXR  is stable following removal of the chest tube. The PleurX catheter drain 450 mL yesterday.  We were unable to arrange for a home health agency to  assume care of Mr. Cherry Pleurx to permit discharge as planned on yesterday.  He lives alone and is unable to accomplish this by himself.  We will confer with care management team tomorrow morning for home health arrangements and hopefully discharge to home tomorrow. We will continue care here with drainage every other day an as needed.    LOS: 3 days    Antony Odea, PA-C [phone] 05/20/2018  I have seen and examined the patient and agree with the assessment and plan as outlined.  Awaiting disposition.  Rexene Alberts, MD 05/20/2018 1:59 PM

## 2018-05-20 NOTE — Progress Notes (Signed)
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  Since Jan this year he has had 4 Thoracentesis procedures (3 admissions and 1 done just in the ED on 2/27) with improvement after each, followed by recurrence of pleural effusion and recurrence of SOB.  Pleural fluid has shown adenocarcinoma.  Plurex catheter was recommended, but patient refused this previously.  Patient presented with recurrent pleural effusion and progressively worsening SOB. Patient admitted to hospitalist service and thoracic surgery consulted for Pleurx catheter placement.  05/20/2018: Patient is awaiting disposition.  Patient is clinically stable for discharge.  The barrier to discharge is been able to find home health agency that will look after patient's Pleurx catheter.  No new complaints.   Subjective: No new complaints. Shortness of breath.   No chest pain.  Objective: Vitals:   05/20/18 0906 05/20/18 1526 05/20/18 1529 05/20/18 1530  BP:   129/84   Pulse:    77  Resp:    14  Temp:  98.3 F (36.8 C)    TempSrc:  Oral    SpO2: 94%   93%  Weight:      Height:       No intake or output data in the 24 hours ending 05/20/18 1710 Filed Weights   05/15/18 2227 05/16/18 1438 05/18/18 0648  Weight: 112.3 kg 112.3 kg 108.5 kg    Physical Examination:  General exam: Appears calm and comfortable  Respiratory system: Decreased air entry right lung base posteriorly.  Pleurx catheter in place and covered.   Cardiovascular system: S1 & S2 heard. No pedal edema. Gastrointestinal system: Abdomen is nondistended, soft and nontender. No organomegaly or masses felt. Normal bowel sounds heard. Central nervous system: Alert and oriented.  Patient moves all limbs.   Extremities: No leg edema.  Data Reviewed: I have personally  reviewed following labs and imaging studies  CBC: Recent Labs  Lab 05/15/18 1913  WBC 11.1*  NEUTROABS 7.2  HGB 17.0  HCT 53.6*  MCV 89.2  PLT 837   Basic Metabolic Panel: Recent Labs  Lab 05/15/18 1913 05/18/18 0703  NA 140 139  K 3.4* 4.2  CL 104 101  CO2 28 30  GLUCOSE 114* 100*  BUN 9 10  CREATININE 1.28* 1.19  CALCIUM 8.8* 8.6*   GFR: Estimated Creatinine Clearance: 84 mL/min (by C-G formula based on SCr of 1.19 mg/dL). Liver Function Tests: No results for input(s): AST, ALT, ALKPHOS, BILITOT, PROT, ALBUMIN in the last 168 hours. No results for input(s): LIPASE, AMYLASE in the last 168 hours. No results for input(s): AMMONIA in the last 168 hours. Coagulation Profile: No results for input(s): INR, PROTIME in the last 168 hours. Cardiac Enzymes: No results for input(s): CKTOTAL, CKMB, CKMBINDEX, TROPONINI in the last 168 hours. BNP (last 3 results) No results for input(s): PROBNP in the last 8760 hours. HbA1C: No results for input(s): HGBA1C in the last 72 hours. CBG: No results for input(s): GLUCAP in the last 168 hours. Lipid Profile: No results for input(s): CHOL, HDL, LDLCALC, TRIG, CHOLHDL, LDLDIRECT in the last 72 hours. Thyroid Function Tests: No results for input(s): TSH, T4TOTAL, FREET4, T3FREE, THYROIDAB in the last 72 hours. Anemia Panel: No results for input(s): VITAMINB12, FOLATE, FERRITIN, TIBC, IRON, RETICCTPCT in the last 72 hours. Sepsis Labs: No results for input(s): PROCALCITON, LATICACIDVEN  in the last 168 hours.  Recent Results (from the past 240 hour(s))  Surgical pcr screen     Status: None   Collection Time: 05/15/18 11:52 PM  Result Value Ref Range Status   MRSA, PCR NEGATIVE NEGATIVE Final   Staphylococcus aureus NEGATIVE NEGATIVE Final    Comment: (NOTE) The Xpert SA Assay (FDA approved for NASAL specimens in patients 54 years of age and older), is one component of a comprehensive surveillance program. It is not intended to  diagnose infection nor to guide or monitor treatment. Performed at [facility], [place] [address], [place], [place] [address]       Radiology Studies: Dg Chest Port 1 View  Result Date: 05/20/2018 CLINICAL DATA:  Follow-up chest tube removal EXAM: PORTABLE CHEST 1 VIEW COMPARISON:  05/19/2018 FINDINGS: Cardiac shadow is stable. Pacing device is again seen. PleurX catheter is noted over the right base. No definitive pneumothorax is seen. Minimal pleural fluid is noted laterally. No other focal abnormality is noted. IMPRESSION: Stable appearance of the chest from the previous day. Electronically Signed   By: Inez Catalina M.D.   On: 05/20/2018 10:33   Dg Chest Port 1 View  Result Date: 05/19/2018 CLINICAL DATA:  Follow-up pneumothorax EXAM: PORTABLE CHEST 1 VIEW COMPARISON:  05/18/2018 FINDINGS: Cardiac shadows is stable. Pacing device is again seen. Right-sided chest tube has been removed in the interval. Residual inferior chest tube remains on the right. No sizable pneumothorax is seen. Considerable pleural thickening is noted laterally stable from the previous exam. The left lung is clear. IMPRESSION: Interval removal of 1 of the 2 right chest tubes. No significant recurrent pneumothorax is seen. Stable pleural thickening is noted on the right. Electronically Signed   By: Inez Catalina M.D.   On: 05/19/2018 08:46        Scheduled Meds: . aspirin EC  81 mg Oral Daily  . atorvastatin  40 mg Oral QHS  . busPIRone  5 mg Oral BID  . cholecalciferol  1,000 Units Oral Daily  . donepezil  5 mg Oral QHS  . enoxaparin (LOVENOX) injection  40 mg Subcutaneous Q24H  . escitalopram  20 mg Oral Daily  . levalbuterol  1.25 mg Nebulization TID  . pantoprazole  40 mg Oral Daily  . senna  1 tablet Oral BID  . sodium chloride flush  3 mL Intravenous Q12H  . vitamin B-12  500 mcg Oral Daily   Continuous Infusions: . sodium chloride    . lactated ringers 10 mL/hr at 05/16/18 1445    Assessment &  Plan:   Recurrent right malignant pleural effusion:  Pleurx catheter placement yesterday with complications of right-sided pneumothorax, now on chest tube to waterseal.  Repeat chest x-ray this a.m shows no pneumothorax, small right pleural effusion.  Management per thoracic surgery.    Hypertension/hyperlipidemia:  on lisinopril and statins  Mild AKI: creatinine peaked to 1.28 from baseline 0.8. Hold ACEI and scheduled Toradol dosing. Labs in am  Hypokalemia: replace  Adenocarcinoma of right lung: Follow with oncology on discharge.  [facility] is [facility] and [facility]  COPD/sleep apnea: Neb treatments PRN.  Resume CPAP  PTSD: Resumed home medications.  GERD: PPI  DVT prophylaxis: lovenox Code Status: full code Family / Patient Communication: d/w patient  Disposition Plan: home once cleared medically     LOS: 3 days    Time spent: 25 minutes    Bonnell Public, MD [facility] Pager  [phone]  If 7PM-7AM, please contact night-coverage [email or web address] Password [email or web address] 05/20/2018, 5:10 PM

## 2018-05-21 ENCOUNTER — Encounter (HOSPITAL_COMMUNITY): Payer: Self-pay | Admitting: Cardiology

## 2018-05-21 MED ORDER — OXYCODONE-ACETAMINOPHEN 5-325 MG PO TABS: 1.0000 | ORAL_TABLET | Freq: Three times a day (TID) | ORAL | 0 refills | Status: AC | PRN

## 2018-05-21 MED ORDER — ACETAMINOPHEN 325 MG PO TABS: 650.0000 mg | ORAL_TABLET | Freq: Four times a day (QID) | ORAL | Status: DC | PRN

## 2018-05-21 MED ORDER — ASPIRIN 81 MG PO TBEC: 81.0000 mg | DELAYED_RELEASE_TABLET | Freq: Every day | ORAL | Status: DC

## 2018-05-21 MED ORDER — LEVALBUTEROL HCL 1.25 MG/0.5ML IN NEBU
1.2500 mg | INHALATION_SOLUTION | Freq: Four times a day (QID) | RESPIRATORY_TRACT | Status: DC | PRN
  Administered 2018-05-22: 1.25 mg via RESPIRATORY_TRACT
  Filled 2018-05-21: qty 0.5

## 2018-05-21 NOTE — Discharge Instructions (Signed)
Pleurix drainage every other day : If <150 ml /drainage session x3 consecutive occasions on  QOD drainage schedule- call [facility] office  [phone]) for evaluation and possible removal

## 2018-05-21 NOTE — Progress Notes (Signed)
Pleurx catheter drained per order. 500cc of clear yellow fluid drained. Pt tolerated procedure well. Catheter redressed. Will continue to monitor.  Clyde Canterbury, RN

## 2018-05-21 NOTE — Progress Notes (Addendum)
5 Days Post-Op Procedure(s) (LRB): INSERTION PLEURAL DRAINAGE CATHETER (Right) Chest Tube Insertion (Right) Subjective: Resting comfortably.  No new problems. Pleurx last drained 450 mL of serous fluid on 05/19/18.   Objective: Vital signs in last 24 hours: Temp:  [97.6 F (36.4 C)-98.3 F (36.8 C)] 97.6 F (36.4 C) (03/09 0827) Pulse Rate:  [69-88] 88 (03/09 0900) Cardiac Rhythm: Normal sinus rhythm (03/09 0700) Resp:  [13-18] 18 (03/09 0900) BP: (113-160)/(64-95) 125/67 (03/09 0827) SpO2:  [93 %-100 %] 95 % (03/09 0900)   Intake/Output from previous day: No intake/output data recorded. Intake/Output this shift: Total I/O In: 360 [P.O.:360] Out: -   General appearance:alert, cooperative and mild distress Heart:regular rate and rhythm Chest:breath soundsclear to auscultation bilaterally, diminished on right.. No subQ air. Wound:The Pleurx catheter is covered with a clean, dry dressing.   Lab Results: No results for input(s): WBC, HGB, HCT, PLT in the last 72 hours. BMET: No results for input(s): NA, K, CL, CO2, GLUCOSE, BUN, CREATININE, CALCIUM in the last 72 hours.  PT/INR: No results for input(s): LABPROT, INR in the last 72 hours. ABG    Component Value Date/Time   PHART 7.452 (H) 05/16/2018 0050   HCO3 25.7 05/16/2018 0050   ACIDBASEDEF 2.6 (H) 05/01/2018 2029   O2SAT 95.0 05/16/2018 0050   CBG (last 3)  No results for input(s): GLUCAP in the last 72 hours.  Assessment/Plan: S/P Procedure(s) (LRB): INSERTION PLEURAL DRAINAGE CATHETER (Right) Chest Tube Insertion (Right)  -POD4placement of a right PleurX catheter for recurrent malignant pleural effusion and placement of a right chest tube for acute pneumothorax. CXRis stable following removal of the chest tube.The PleurX catheter drain 450 mL on 05/19/18. Will drain again today.   We were unable to arrange for a home health agency to assume care of Mr. Kelley Pleurx to permit discharge as planned on  Saturday.  He lives alone and is unable to accomplish this by himself.  We will confer with the care management team for home health arrangements and hopefully discharge to home later today.    LOS: 4 days    Antony Odea, [place] [phone] 05/21/2018 Patient seen and examined, agree with above  Remo Lipps C. Roxan Hockey, MD [facility] [phone]

## 2018-05-21 NOTE — Progress Notes (Addendum)
Received return call from [facility] at the [place] clinic regarding pt's transition needs for [facility] and pleurX cath drainage. Per Burman Nieves pt could use his [facility] benefits under [employer] 3rd party billing as long as [facility] agency willing to accept this. She provided four agencies to contact (two of which where on list from Friday and have already refused referral)- CM contacted other two agencies and one is looking into see if they can provide needed staffing to accept referral- other one states they can not accept due to not having the staffing, will await word from [facility].   Update: [id number]- received call from [facility] with [facility]- they are still reviewing patient for clinical needs with Pleurx- need to speak with the clinical supervisor who has been unavailable today to be sure they can provide pt with the care he needs at home for his PleurX cath- will give this CM a call in the am regarding confirmation on [facility] services.

## 2018-05-21 NOTE — Consult Note (Signed)
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 Since Jan 2020 he has required 4 thoracentesis procedures (3 admissions and 1 done in the ED on 2/27) with improvement after each, followed by recurrence of pleural effusion and SOB. Pleural fluid cytology noted adenocarcinoma. Plurex catheter was previously recommended, but the patient refused.   He was admitted 3/3 w/ recurrent symptomatic pleural effusion, and underwent placement of a Pleurex and chest tube in the OR on 05/16/18. Post-op care has been directed by TCTS.   Subjective: No active medical issues for TRH to address today. Disposition per TCTS, who has been directing care post-op. TRH will continue to follow w/ you until D/C.   Assessment & Plan:  Recurrentrightmalignant pleural effusion Pleurx catheter placement yesterday with complications of right-sided pneumothorax, now on chest tube to waterseal.  Repeat chest x-ray this a.m shows no pneumothorax, small right pleural effusion.  Management per thoracic surgery.   Hypertension BP well controlled  HLD Cont home medical tx  Mild AKI crt has returned to normal w/ GFR > 60  Hypokalemia Corrected   Adenocarcinoma of right lung Follow with Oncology in outpt setting   COPD / sleep apnea Neb PRN - cont usual home CPAP regimen   PTSD Cont usual home medications  DVT prophylaxis: lovenox  Code Status: FULL CODE Family Communication:  Disposition Plan:   Consultants:  TCTS  Antimicrobials:  None    Objective: Blood pressure 125/67, pulse 88, temperature 97.6 F (36.4 C), temperature source Oral, resp. rate 18, height 6' (1.829 m), weight 108.5 kg, SpO2 95 %.  Intake/Output Summary (Last 24 hours) at 05/21/2018 1145 Last data filed at 05/21/2018 0900 Gross per 24 hour    Intake 360 ml  Output -  Net 360 ml   Filed Weights   05/15/18 2227 05/16/18 1438 05/18/18 0648  Weight: 112.3 kg 112.3 kg 108.5 kg    Examination: No exam today   CBC: Recent Labs  Lab 05/15/18 1913  WBC 11.1*  NEUTROABS 7.2  HGB 17.0  HCT 53.6*  MCV 89.2  PLT 694   Basic Metabolic Panel: Recent Labs  Lab 05/15/18 1913 05/18/18 0703  NA 140 139  K 3.4* 4.2  CL 104 101  CO2 28 30  GLUCOSE 114* 100*  BUN 9 10  CREATININE 1.28* 1.19  CALCIUM 8.8* 8.6*   GFR: Estimated Creatinine Clearance: 84 mL/min (by C-G formula based on SCr of 1.19 mg/dL).   Recent Results (from the past 240 hour(s))  Surgical pcr screen     Status: None   Collection Time: 05/15/18 11:52 PM  Result Value Ref Range Status   MRSA, PCR NEGATIVE NEGATIVE Final   Staphylococcus aureus NEGATIVE NEGATIVE Final    Comment: (NOTE) The Xpert SA Assay (FDA approved for NASAL specimens in patients 72 years of age and older), is one component of a comprehensive surveillance program. It is not intended to diagnose infection nor to guide or monitor treatment. Performed at [facility], [place] [address] [place], [place] [address]      Scheduled Meds: . aspirin EC  81 mg Oral Daily  . atorvastatin  40 mg Oral QHS  . busPIRone  5 mg Oral BID  . cholecalciferol  1,000 Units Oral Daily  . donepezil  5 mg  Oral QHS  . enoxaparin (LOVENOX) injection  40 mg Subcutaneous Q24H  . escitalopram  20 mg Oral Daily  . levalbuterol  1.25 mg Nebulization TID  . pantoprazole  40 mg Oral Daily  . senna  1 tablet Oral BID  . sodium chloride flush  3 mL Intravenous Q12H  . vitamin B-12  500 mcg Oral Daily     LOS: 4 days   Cherene Altes, MD [facility] Office  [phone] Pager - Text Page per Shea Evans  If 7PM-7AM, please contact night-coverage per Amion 05/21/2018, 11:45 AM

## 2018-05-22 NOTE — Progress Notes (Signed)
6 Days Post-Op Procedure(s) (LRB): INSERTION PLEURAL DRAINAGE CATHETER (Right) Chest Tube Insertion (Right) Subjective: Sitting up eating breakfast. Denies pain or shortness of breath.   Objective: Vital signs in last 24 hours: Temp:  [97.6 F (36.4 C)-98.4 F (36.9 C)] 97.6 F (36.4 C) (03/10 0553) Pulse Rate:  [64-85] 64 (03/10 0553) Cardiac Rhythm: Normal sinus rhythm (03/10 0700) Resp:  [11-25] 11 (03/10 0840) BP: (132-145)/(79-97) 141/97 (03/10 0840) SpO2:  [95 %-99 %] 95 % (03/10 0553)     Intake/Output from previous day: 03/09 0701 - 03/10 0700 In: 1080 [P.O.:1080] Out: -  Intake/Output this shift: Total I/O In: 360 [P.O.:360] Out: -    General appearance:alert, cooperative and mild distress Heart:regular rate and rhythm Chest:breath soundsclear to auscultation bilaterally, diminished on right. Wound:The Pleurx catheter is covered with a clean,dry dressing.   Lab Results: No results for input(s): WBC, HGB, HCT, PLT in the last 72 hours. BMET: No results for input(s): NA, K, CL, CO2, GLUCOSE, BUN, CREATININE, CALCIUM in the last 72 hours.  PT/INR: No results for input(s): LABPROT, INR in the last 72 hours. ABG    Component Value Date/Time   PHART 7.452 (H) 05/16/2018 0050   HCO3 25.7 05/16/2018 0050   ACIDBASEDEF 2.6 (H) 05/01/2018 2029   O2SAT 95.0 05/16/2018 0050   CBG (last 3)  No results for input(s): GLUCAP in the last 72 hours.  Assessment/Plan: S/P Procedure(s) (LRB): INSERTION PLEURAL DRAINAGE CATHETER (Right) Chest Tube Insertion (Right)  -POD5placement of a right PleurX catheter for recurrent malignant pleural effusion and placement of a right chest tube for acute pneumothorax that has resolved. ThePleurX catheterdrain 450 mL on 05/19/18, 52ml on 05/21/18.  Care management has arranged for a home health agency to assume care of Mr. Banwart Pleurx as of this morning. Will discharge to home.     LOS: 5 days    Antony Odea, PA-C [phone] 05/22/2018

## 2018-05-22 NOTE — Progress Notes (Signed)
Patient given discharge instructions medication list and follow up appointments. Patient also given paper prescription. Patient verbalized understanding of AVS. IV and tele were dcd. Will discharge home as ordered. Patient to be transported to exit with wheel chair and nursing staff. Norman Piacentini, Bettina Gavia  rN

## 2018-05-22 NOTE — Care Management Note (Signed)
Case Management Note Marvetta Gibbons RN, BSN Transitions of Care Unit 4E- RN Case Manager [phone]  Patient Details  Name: Justan Gaede MRN: [id number] Date of Birth: Jul 02, 1960  Subjective/Objective:    Pt admitted with recurrent Pl. Effusion, s/p pleurx Cath drain, with pntx and CT placement                Action/Plan: PTA pt lived at home alone, noted pt will need HH for pleurX cath needs on transition home. Pt will need HHRN orders with drainage orders prior to discharge . PleurX forms have been signed by MD and started, on shadow chart - will need to be completed and faxed to [facility] (and original mailed)- Pt is followed at the [facility] clinic for PCP needs- Dr. Collins [place], with CSW at the [facility]- contact # [phone] ext. [phone] (pager [phone]). CM spoke with pt at bedside regarding [place] needs at discharge for PleurX - pt states he does not really have anyone to assist him, has neighbors that "look in on him" but not sure any of them would be willing to help him with this. Pt states he is willing to use his Medicaid benefits to start [facility] services until [place] can be reached to see if they will be setting up [facility] for ongoing pleurX needs. Choice offered Per CMS guidelines from medicare.gov website with star ratings (copy placed in shadow chart)- pt states he does not have a preference. Calls made to all agencies on this list- and at this time no agency able to accept referral for pleurX needs either due to unable to take Medicaid or do not take pleurX caths. May need to wait to hear from [place] before transition home to secure assistance. CM has called CSW at the [place] this am and left message but as of this time have not received a call back regarding d/c needs. Pt will need box of catheter drainage kits to send home with him once [facility] agency found that can accept referral.   Expected Discharge Date:  05/21/18               Expected Discharge Plan:  [place]  In-House Referral:     Discharge planning Services  CM Consult  Post Acute Care Choice:  Durable Medical Equipment, Home Health Choice offered to:  Patient  DME Arranged:  Other see comment DME Agency:  Other - Comment  HH Arranged:  RN [place] Agency:  Surveyor, quantity  Status of Service:  Completed, signed off  If discussed at [employer], dates discussed:    Discharge Disposition: home/home health   Additional Comments:  05/22/18- 1000- Marvetta Gibbons RN, CM- have received call this am from [facility] spoke with Lovena Le and Junie Panning, who have confirmed they will be able to accept referral and do start of care for 05/23/18- they have reached out to [place] for auth, pt has box of 10 drain kits at bedside for transition home. Maxim RN will come to hospital at 3pm today to see pt and speak with bedside RN prior to pt's discharge. Pt will need assist with transportation home, TOC to provide taxi voucher home. CM spoke with pt at bedside and discussed plan with pt who is agreeable and thankful for [facility] agency who as agreed to accept pt. CM has finished filling out PleurX forms and faxed to [facility], also faxed to [facility], and will mail originals.   Maika Kaczmarek, [place],  RN 05/22/2018, 11:19 AM

## 2018-05-23 ENCOUNTER — Telehealth: Payer: Self-pay | Admitting: *Deleted

## 2018-05-23 DIAGNOSIS — C3491 Malignant neoplasm of unspecified part of right bronchus or lung: Secondary | ICD-10-CM

## 2018-05-23 DIAGNOSIS — J910 Malignant pleural effusion: Secondary | ICD-10-CM

## 2018-05-23 NOTE — Telephone Encounter (Signed)
Oncology Nurse Navigator Documentation  Oncology Nurse Navigator Flowsheets 05/23/2018  Navigator Location [facility]  Navigator Encounter Type Telephone/I called to follow up with patient about his appt with Dr. Julien Nordmann.  I was unable to reach but did leave v mv message to call with my name and phone number.   Treatment Phase Pre-Tx/Tx Discussion  Barriers/Navigation Needs Education  Education Other  Interventions Education  Education Method Verbal  Acuity Level 1  Time Spent with Patient 15

## 2018-05-24 ENCOUNTER — Telehealth: Payer: Self-pay | Admitting: *Deleted

## 2018-05-24 NOTE — Telephone Encounter (Signed)
Oncology Nurse Navigator Documentation  Oncology Nurse Navigator Flowsheets 05/24/2018  Navigator Location [facility]  Navigator Encounter Type Telephone/I received a call from Mr. Wedge.  I called him back but was unable to reach or leave a vm message.   Telephone Incoming Call;Outgoing Call  Treatment Phase Pre-Tx/Tx Discussion  Acuity Level 1  Time Spent with Patient 15

## 2018-05-25 ENCOUNTER — Inpatient Hospital Stay: Payer: No Typology Code available for payment source | Attending: Internal Medicine

## 2018-05-25 ENCOUNTER — Inpatient Hospital Stay: Payer: No Typology Code available for payment source | Admitting: Internal Medicine

## 2018-05-25 ENCOUNTER — Telehealth: Payer: Self-pay | Admitting: *Deleted

## 2018-05-25 ENCOUNTER — Encounter: Payer: Self-pay | Admitting: Cardiothoracic Surgery

## 2018-05-25 NOTE — Telephone Encounter (Signed)
Oncology Nurse Navigator Documentation  Oncology Nurse Navigator Flowsheets 05/25/2018  Navigator Location [facility]  Navigator Encounter Type Telephone/Patient missed his appt with Dr. Julien Nordmann today.  I called to check on him but was unable to reach him or leave a vm message.  Dr. Julien Nordmann updated.   Telephone Outgoing Call  Treatment Phase Pre-Tx/Tx Discussion  Acuity Level 1  Time Spent with Patient 15

## 2018-05-28 ENCOUNTER — Telehealth: Payer: Self-pay | Admitting: *Deleted

## 2018-05-28 NOTE — Telephone Encounter (Signed)
Oncology Nurse Navigator Documentation  Oncology Nurse Navigator Flowsheets 05/28/2018  Navigator Location [facility]  Referral date to RadOnc/MedOnc -  Navigator Encounter Type Telephone/I called Daniel Bryant to check on him.  I was unable to reach him or leave a vm message.    Telephone Outgoing Call  Abnormal Finding Date -  Confirmed Diagnosis Date -  Patient Visit Type -  Treatment Phase Pre-Tx/Tx Discussion  Barriers/Navigation Needs -  Education -  Interventions -  Coordination of Care -  Education Method -  Acuity Level 1  Time Spent with Patient 15

## 2018-05-31 ENCOUNTER — Encounter (HOSPITAL_COMMUNITY): Payer: Self-pay | Admitting: Cardiology

## 2018-05-31 ENCOUNTER — Telehealth: Payer: Self-pay | Admitting: *Deleted

## 2018-05-31 ENCOUNTER — Encounter (HOSPITAL_COMMUNITY): Payer: Self-pay | Admitting: Internal Medicine

## 2018-05-31 NOTE — Telephone Encounter (Signed)
Oncology Nurse Navigator Documentation  Oncology Nurse Navigator Flowsheets 05/31/2018  Navigator Location [facility]  Referral date to RadOnc/MedOnc -  Navigator Encounter Type Telephone/I called Mr. Hasten to re-schedule.  I was unable to reach or leave a vm message.   Telephone Outgoing Call  Abnormal Finding Date -  Confirmed Diagnosis Date -  Patient Visit Type -  Treatment Phase Pre-Tx/Tx Discussion  Barriers/Navigation Needs -  Education -  Interventions -  Coordination of Care -  Education Method -  Acuity Level 1  Time Spent with Patient 15

## 2018-06-04 ENCOUNTER — Telehealth: Payer: Self-pay | Admitting: *Deleted

## 2018-06-04 ENCOUNTER — Telehealth: Payer: Self-pay

## 2018-06-04 NOTE — Telephone Encounter (Signed)
Home Health nurse, Magda Paganini, contacted the office [phone] with questions regarding patient sutures and multiple concerns about the patient.  Patient had PleurX catheter placed 05/16/2018 with Dr. Prescott Gum.  She stated that patient was to the point that "he was going to take the sutures out himself".  I advised against that.  Home Health nurse stated that her agency did not have the kits to remove the sutures from the home and he would have to come in to get them out.  I made an appointment on Wednesday, 06/06/2018 for his sutures to be removed.  She stated that she would be at his home today and would let him know.  She stated that he does not have a ride and she may have to bring him to his appointment.    She also stated that the patient only had 2 bottles left of PleurX drainage kits.  He is currently draining every other day and gets roughly 530mls out.  I advised her to contact the PleurX navigators in regards to getting more bottles as the patient has [place] insurance and Medicaid and unable to find a supplier of bottles.  She acknowledged receipt.  I also stated that if she and patient was unable to get bottles to let us know on Wednesday and he could supply some with what we have at the office.  If that is the case, we will let Dr. Prescott Gum know the situation and assess accordingly.

## 2018-06-04 NOTE — Telephone Encounter (Signed)
Oncology Nurse Navigator Documentation  Oncology Nurse Navigator Flowsheets 06/04/2018  Navigator Location [facility]  Referral date to RadOnc/MedOnc -  Navigator Encounter Type Telephone/I called Daniel Bryant today to schedule him to be seen with Dr. Julien Nordmann.  I was unable to reach or leave a vm message.   Telephone Outgoing Call  Abnormal Finding Date -  Confirmed Diagnosis Date -  Patient Visit Type -  Treatment Phase Pre-Tx/Tx Discussion  Barriers/Navigation Needs -  Education -  Interventions -  Coordination of Care -  Education Method -  Acuity Level 1  Time Spent with Patient 15

## 2018-06-06 ENCOUNTER — Other Ambulatory Visit: Payer: Self-pay

## 2018-06-06 ENCOUNTER — Encounter: Payer: Self-pay | Admitting: *Deleted

## 2018-06-06 ENCOUNTER — Ambulatory Visit (INDEPENDENT_AMBULATORY_CARE_PROVIDER_SITE_OTHER): Payer: Self-pay

## 2018-06-06 ENCOUNTER — Telehealth: Payer: Self-pay | Admitting: *Deleted

## 2018-06-06 VITALS — Temp 98.6°F

## 2018-06-06 DIAGNOSIS — Z4802 Encounter for removal of sutures: Secondary | ICD-10-CM

## 2018-06-06 NOTE — Telephone Encounter (Signed)
Oncology Nurse Navigator Documentation  Oncology Nurse Navigator Flowsheets 06/06/2018  [facility]  Referral date to RadOnc/MedOnc -  Navigator Encounter Type Telephone/I received a call from a nurse who works with Mr. Vespa.  She states she is not sure patient is being seen with oncology at [facility].  [facility] and if this is not with [facility], she will call me back so I can get him in with Dr. Julien Nordmann.   Telephone Incoming Call  Abnormal Finding Date -  Confirmed Diagnosis Date -  Patient Visit Type -  Treatment Phase Pre-Tx/Tx Discussion  Barriers/Navigation Needs Education  Education -  Interventions Education  Coordination of Care Other  Education Method -  Acuity Level 1  Time Spent with Patient 15

## 2018-06-06 NOTE — Progress Notes (Signed)
Oncology Nurse Navigator Documentation  Oncology Nurse Navigator Flowsheets 06/06/2018  Navigator Location [facility]  Referral date to RadOnc/MedOnc -  Navigator Encounter Type Telephone;Other/I received a message from nurse at thoracic office that patient is being seen by [facility] for treatment.  He has an appt tomorrow.  I called his phone but was unable to reach him.  Telephone Outgoing Call  Abnormal Finding Date -  Confirmed Diagnosis Date -  Patient Visit Type -  Treatment Phase Pre-Tx/Tx Discussion  Barriers/Navigation Needs Coordination of Care  Education -  Interventions Coordination of Care  Coordination of Care Other  Education Method -  Acuity Level 2  Time Spent with Patient 15

## 2018-06-06 NOTE — Progress Notes (Signed)
Removed 5 sutures from incision sites with no signs of infection and patient tolerated well.

## 2018-06-08 ENCOUNTER — Encounter: Payer: Self-pay | Admitting: *Deleted

## 2018-06-08 NOTE — Progress Notes (Signed)
Oncology Nurse Navigator Documentation  Oncology Nurse Navigator Flowsheets 06/08/2018  Navigator Location [facility]  Referral date to RadOnc/MedOnc -  Navigator Encounter Type Telephone/I received referral from [place] for patient to be seen by oncology here.  I called patient but was unable to reach.  I received another message with a number for a coordinator to call to reach her on behalf of Mr. Vonbargen.  I called this number but I was unable to reach or leave a vm message.   Telephone Outgoing Call  Abnormal Finding Date -  Confirmed Diagnosis Date -  Patient Visit Type -  Treatment Phase Pre-Tx/Tx Discussion  Barriers/Navigation Needs -  Education Other  Interventions -  Coordination of Care -  Education Method -  Acuity Level 2  Time Spent with Patient 30

## 2018-06-11 ENCOUNTER — Other Ambulatory Visit: Payer: Self-pay | Admitting: *Deleted

## 2018-06-11 ENCOUNTER — Encounter: Payer: Self-pay | Admitting: *Deleted

## 2018-06-11 ENCOUNTER — Telehealth: Payer: Self-pay | Admitting: *Deleted

## 2018-06-11 ENCOUNTER — Ambulatory Visit: Payer: No Typology Code available for payment source

## 2018-06-11 NOTE — Telephone Encounter (Signed)
Oncology Nurse Navigator Documentation  Oncology Nurse Navigator Flowsheets 06/11/2018  Navigator Location [facility]  Referral date to RadOnc/MedOnc -  Navigator Encounter Type Other/I received a call from [place] and [facility] nurse wanting thoughts on plan of care.  We discussed that due to his cognitive impairment that living placement is needed.  I will updated Dr. Julien Nordmann that the patient can't answer the phone and not sure how he would do with chemotherapy and the education with this treatment plan. He is scheduled for an appt this week.   Telephone -  Abnormal Finding Date -  Confirmed Diagnosis Date -  Patient Visit Type -  Treatment Phase -  Barriers/Navigation Needs Coordination of Care  Education -  Interventions Coordination of Care  Coordination of Care -  Education Method -  Acuity Level 2  Time Spent with Patient 30

## 2018-06-11 NOTE — Telephone Encounter (Signed)
Oncology Nurse Navigator Documentation  Oncology Nurse Navigator Flowsheets 06/11/2018  Navigator Location [facility]  Referral date to RadOnc/MedOnc -  Navigator Encounter Type Telephone/I called patient with the help of the CSW.  I spoke with him and updated him on his appt with Dr. Julien Nordmann.  I also contacted [place] with transportation to help with get him to appt.   Telephone Outgoing Call  Abnormal Finding Date -  Confirmed Diagnosis Date -  Patient Visit Type -  Treatment Phase Pre-Tx/Tx Discussion  Barriers/Navigation Needs Education;Coordination of Care  Education Other  Interventions Coordination of Care;Education  Coordination of Care Appts  Education Method Verbal  Acuity Level 2  Time Spent with Patient 30

## 2018-06-11 NOTE — Telephone Encounter (Signed)
Oncology Nurse Navigator Documentation  Oncology Nurse Navigator Flowsheets 06/11/2018  Navigator Location [facility]  Referral date to RadOnc/MedOnc -  Navigator Encounter Type Telephone/[facility] with Dr. Julien Nordmann.  I spoke with Ms. Hassell Done who updated me Daniel Bryant has been a challenge to schedule and set up for an supportive services due to poor cognition.  She gave me the [facility] nurse number to call.  I also got a message from [place] office to call a Ms. Pate about patient to get him scheduled for his appt with Dr. Julien Nordmann. I called her and spoke with her for a while.  She can help some but wanted me to call the CSW.  I called Lattie Haw CSW.  I spoke with Lattie Haw for a while and learned the extent of his cognitive deficit.  She states patient can not answer his phone.  I did update her that I had trouble reaching him several time.  We discussed patient getting into an assisted living for his deficits.  She will work on this and I will update Ms. Pate to bring patient to appts this week.    Telephone Outgoing Call  Abnormal Finding Date -  Confirmed Diagnosis Date -  Patient Visit Type -  Treatment Phase Pre-Tx/Tx Discussion  Barriers/Navigation Needs Coordination of Care  Education -  Interventions Coordination of Care  Coordination of Care Appts;Other  Education Method -  Acuity Level 4  Time Spent with Patient > 120

## 2018-06-11 NOTE — Progress Notes (Signed)
Oncology Nurse Navigator Documentation  Oncology Nurse Navigator Flowsheets 06/11/2018  Navigator Location [facility]  Referral date to RadOnc/MedOnc -  Navigator Encounter Type Other/I called coordinator Blanchard Kelch who is working with Mr. Yepiz to schedule him with Dr. Julien Nordmann.  I was unable to reach but did leave a vm message to call with my name and phone number.   Telephone -  Abnormal Finding Date -  Confirmed Diagnosis Date -  Patient Visit Type -  Treatment Phase Pre-Tx/Tx Discussion  Barriers/Navigation Needs Education  Education -  Interventions Education  Coordination of Care Other  Education Method -  Acuity Level 1  Time Spent with Patient 15

## 2018-06-12 ENCOUNTER — Encounter (HOSPITAL_COMMUNITY): Payer: Self-pay | Admitting: Emergency Medicine

## 2018-06-12 ENCOUNTER — Emergency Department (HOSPITAL_COMMUNITY): Payer: Medicaid Other

## 2018-06-12 ENCOUNTER — Emergency Department (HOSPITAL_COMMUNITY)
Admission: EM | Admit: 2018-06-12 | Discharge: 2018-06-12 | Disposition: A | Discharge status: Home / Self Care | Payer: Medicaid Other | Attending: Emergency Medicine | Admitting: Emergency Medicine

## 2018-06-12 ENCOUNTER — Other Ambulatory Visit: Payer: Self-pay

## 2018-06-12 DIAGNOSIS — J449 Chronic obstructive pulmonary disease, unspecified: Secondary | ICD-10-CM | POA: Insufficient documentation

## 2018-06-12 DIAGNOSIS — J90 Pleural effusion, not elsewhere classified: Secondary | ICD-10-CM

## 2018-06-12 DIAGNOSIS — R0602 Shortness of breath: Secondary | ICD-10-CM | POA: Diagnosis present

## 2018-06-12 DIAGNOSIS — Z950 Presence of cardiac pacemaker: Secondary | ICD-10-CM | POA: Diagnosis not present

## 2018-06-12 DIAGNOSIS — Z859 Personal history of malignant neoplasm, unspecified: Secondary | ICD-10-CM | POA: Diagnosis not present

## 2018-06-12 DIAGNOSIS — F17290 Nicotine dependence, other tobacco product, uncomplicated: Secondary | ICD-10-CM | POA: Insufficient documentation

## 2018-06-12 DIAGNOSIS — Z79899 Other long term (current) drug therapy: Secondary | ICD-10-CM | POA: Diagnosis not present

## 2018-06-12 DIAGNOSIS — I10 Essential (primary) hypertension: Secondary | ICD-10-CM | POA: Insufficient documentation

## 2018-06-12 NOTE — Discharge Instructions (Addendum)
Please contact your home health agency to get someone to come out and drain the effusion as they are scheduled to do.

## 2018-06-12 NOTE — ED Provider Notes (Signed)
Emergency Department Provider Note   I have reviewed the triage vital signs and the nursing notes.   HISTORY  Chief Complaint Hypertension and Shortness of Breath   HPI Daniel Bryant is a 58 y.o. male with mulitple medical problems documented below who presents the emergency department today with shortness of breath.  Patient has hypertension but that is baseline for him he states the reason he is here is because his home health nurse not being able to, and drain his Pleurx catheter for the last few days and thus he is get more short of breath this is pleural effusion secondary to lung cancer.  He has no fevers, cough, nausea, vomiting or other complaints.  No lower extremity swelling.  This is similar to other episodes of the same. No other associated or modifying symptoms.    Past Medical History:  Diagnosis Date  . Asthma   . Cancer ([place])   . COPD (chronic obstructive pulmonary disease) ([place])   . GERD (gastroesophageal reflux disease)   . High cholesterol   . History of petit-mal seizures   . Hyperlipidemia   . Hypertension   . Memory impairment   . OSA (obstructive sleep apnea)    Does not tolerate CPAP  . Pacemaker   . PTSD (post-traumatic stress disorder)   . Sick sinus syndrome [facility])     Patient Active Problem List   Diagnosis Date Noted  . Goals of care, counseling/discussion 06/14/2018  . Encounter for antineoplastic chemotherapy 06/14/2018  . Encounter for antineoplastic immunotherapy 06/14/2018  . Adenocarcinoma of right lung ([place]) 05/02/2018  . COPD (chronic obstructive pulmonary disease) ([place]) 05/02/2018  . Recurrent pleural effusion on right 05/01/2018  . Pacemaker 04/21/2018  . 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 High cholesterol 04/05/2018  . Hypertension 04/05/2018  . Polycythemia 04/05/2018    Past Surgical History:  Procedure Laterality Date  . CARDIAC SURGERY    . CHEST TUBE INSERTION Right 05/16/2018   Procedure: INSERTION PLEURAL  DRAINAGE CATHETER;  Surgeon: Ivin Poot, MD;  Location: [place];  Service: Thoracic;  Laterality: Right;  . CHEST TUBE INSERTION Right 05/16/2018   Procedure: Chest Tube Insertion;  Surgeon: Ivin Poot, MD;  Location: [place];  Service: Thoracic;  Laterality: Right;  . PACEMAKER INSERTION      Current Outpatient Rx  . Order #: [id number] Class: Print  . Order #: [id number] Class: OTC  . Order #: [id number] Class: Historical Med  . Order #: [id number] Class: Historical Med  . Order #: [id number] Class: Historical Med  . Order #: [id number] Class: Historical Med  . Order #: [id number] Class: Normal  . Order #: [id number] Class: Historical Med  . Order #: [id number] Class: Print  . Order #: [id number] Class: Normal  . Order #: [id number] Class: Historical Med    Allergies Patient has no known allergies.  Family History  Problem Relation Age of Onset  . Alzheimer's disease Mother   . Cancer Mother   . Memory loss Father     Social History Social History   Tobacco Use  . Smoking status: Former Smoker    Types: E-cigarettes  . Smokeless tobacco: Current User    Types: Chew  Substance Use Topics  . Alcohol use: Yes    Frequency: Never  . Drug use: No    Review of Systems  All other systems negative except as documented in the HPI. All pertinent positives and negatives as reviewed in the HPI. ____________________________________________   PHYSICAL EXAM:  VITAL SIGNS: ED Triage Vitals  Enc  Vitals Group     BP 06/12/18 1017 (!) 166/113     Pulse Rate 06/12/18 1017 98     Resp 06/12/18 1017 (!) 28     Temp 06/12/18 1017 98.7 F (37.1 C)     Temp Source 06/12/18 1017 Oral     SpO2 06/12/18 1016 95 %     Weight 06/12/18 1018 245 lb (111.1 kg)     Height 06/12/18 1018 6' (1.829 m)    Constitutional: Alert and oriented. Well appearing and in no acute distress. Eyes: Conjunctivae are normal. PERRL. EOMI. Head: Atraumatic. Nose: No congestion/rhinnorhea. Mouth/Throat: Mucous  membranes are moist.  Oropharynx non-erythematous. Neck: No stridor.  No meningeal signs.   Cardiovascular: Normal rate, regular rhythm. Good peripheral circulation. Grossly normal heart sounds.   Respiratory: tachypneic respiratory effort.  No retractions. Lungs diminished on right lower. Gastrointestinal: Soft and nontender. No distention.  Musculoskeletal: No lower extremity tenderness nor edema. No gross deformities of extremities. Neurologic:  Normal speech and language. No gross focal neurologic deficits are appreciated.  Skin:  Skin is warm, dry and intact. No rash noted.   ____________________________________________   LABS (all labs ordered are listed, but only abnormal results are displayed)  Labs Reviewed - No data to display ____________________________________________  EKG   EKG Interpretation  Date/Time:  Tuesday June 12 2018 10:15:59 EDT Ventricular Rate:  99 PR Interval:    QRS Duration: 98 QT Interval:  353 QTC Calculation: 453 R Axis:   58 Text Interpretation:  Sinus rhythm Borderline repolarization abnormality No significant change since last tracing Confirmed by Isla Pence [phone]) on 06/13/2018 12:16:56 PM       ____________________________________________  RADIOLOGY  No results found.  ____________________________________________   PROCEDURES  Procedure(s) performed:   Procedures   ____________________________________________   INITIAL IMPRESSION / ASSESSMENT AND PLAN / ED COURSE  Patient with pleural effusion in the proximal with 4 cc drained the Pleurx catheter is already in place sterilely.  Redressed it.  Repeat x-ray with improving effusion.  Patient symptomatically improved.  Stable for discharge.     Pertinent labs & imaging results that were available during my care of the patient were reviewed by me and considered in my medical decision making (see chart for details).  ____________________________________________  FINAL  CLINICAL IMPRESSION(S) / ED DIAGNOSES  Final diagnoses:  Pleural effusion     MEDICATIONS GIVEN DURING THIS VISIT:  Medications - No data to display   NEW OUTPATIENT MEDICATIONS STARTED DURING THIS VISIT:  Discharge Medication List as of 06/12/2018  1:18 PM      Note:  This note was prepared with assistance of Dragon voice recognition software. Occasional wrong-word or sound-a-like substitutions may have occurred due to the inherent limitations of voice recognition software.   Merrily Pew, MD 06/15/18 [phone]

## 2018-06-12 NOTE — ED Triage Notes (Signed)
Pt BIB [facility] from home complaining of HTN and shortness of breath and pleurex tube needing to be drained. Pt states that a home health nurse comes  Every other day to drain it but has not come for 2 days.

## 2018-06-13 ENCOUNTER — Encounter: Payer: Self-pay | Admitting: *Deleted

## 2018-06-13 NOTE — Progress Notes (Signed)
Oncology Nurse Navigator Documentation  Oncology Nurse Navigator Flowsheets 06/13/2018  Navigator Location [facility]  Referral date to RadOnc/MedOnc -  Navigator Encounter Type Telephone;Other/I received a message from Dr. Julien Nordmann that he would like to see the patient at an earlier time tomorrow.  I called patient and could not reach him.  I called his neighbor and to call him.  She called him then called me back.  I then called patient and he answered.  I updated him on schedule change.  He verbalized understanding.  I updated transportation services and they will pick him up at the earlier time.  [facility] then called [facility] and updated on schedule change.  They will also touch base with patient with another reminder.    Telephone Outgoing Call  Abnormal Finding Date -  Confirmed Diagnosis Date -  Patient Visit Type -  Treatment Phase Pre-Tx/Tx Discussion  Barriers/Navigation Needs Education;Coordination of Care  Education Other  Interventions Coordination of Care;Education  Coordination of Care Appts;Other  Education Method Verbal  Acuity Level 2  Time Spent with Patient 62

## 2018-06-14 ENCOUNTER — Encounter: Payer: Self-pay | Admitting: Internal Medicine

## 2018-06-14 ENCOUNTER — Other Ambulatory Visit: Payer: Self-pay | Admitting: *Deleted

## 2018-06-14 ENCOUNTER — Inpatient Hospital Stay: Payer: No Typology Code available for payment source

## 2018-06-14 ENCOUNTER — Inpatient Hospital Stay: Payer: No Typology Code available for payment source | Attending: Internal Medicine

## 2018-06-14 ENCOUNTER — Telehealth: Payer: Self-pay | Admitting: Medical Oncology

## 2018-06-14 ENCOUNTER — Inpatient Hospital Stay (HOSPITAL_BASED_OUTPATIENT_CLINIC_OR_DEPARTMENT_OTHER): Payer: No Typology Code available for payment source | Admitting: Internal Medicine

## 2018-06-14 ENCOUNTER — Other Ambulatory Visit: Payer: Self-pay

## 2018-06-14 VITALS — BP 145/99 | HR 101 | Temp 98.4°F | Resp 20 | Ht 73.0 in | Wt 240.4 lb

## 2018-06-14 DIAGNOSIS — I10 Essential (primary) hypertension: Secondary | ICD-10-CM | POA: Insufficient documentation

## 2018-06-14 DIAGNOSIS — R079 Chest pain, unspecified: Secondary | ICD-10-CM | POA: Diagnosis not present

## 2018-06-14 DIAGNOSIS — J910 Malignant pleural effusion: Secondary | ICD-10-CM

## 2018-06-14 DIAGNOSIS — K219 Gastro-esophageal reflux disease without esophagitis: Secondary | ICD-10-CM | POA: Insufficient documentation

## 2018-06-14 DIAGNOSIS — R0609 Other forms of dyspnea: Secondary | ICD-10-CM

## 2018-06-14 DIAGNOSIS — J449 Chronic obstructive pulmonary disease, unspecified: Secondary | ICD-10-CM

## 2018-06-14 DIAGNOSIS — Z5111 Encounter for antineoplastic chemotherapy: Secondary | ICD-10-CM

## 2018-06-14 DIAGNOSIS — F4310 Post-traumatic stress disorder, unspecified: Secondary | ICD-10-CM | POA: Diagnosis not present

## 2018-06-14 DIAGNOSIS — G4733 Obstructive sleep apnea (adult) (pediatric): Secondary | ICD-10-CM

## 2018-06-14 DIAGNOSIS — Z5112 Encounter for antineoplastic immunotherapy: Secondary | ICD-10-CM | POA: Insufficient documentation

## 2018-06-14 DIAGNOSIS — C3491 Malignant neoplasm of unspecified part of right bronchus or lung: Secondary | ICD-10-CM

## 2018-06-14 DIAGNOSIS — J45909 Unspecified asthma, uncomplicated: Secondary | ICD-10-CM | POA: Diagnosis not present

## 2018-06-14 DIAGNOSIS — C3411 Malignant neoplasm of upper lobe, right bronchus or lung: Secondary | ICD-10-CM | POA: Diagnosis present

## 2018-06-14 DIAGNOSIS — Z950 Presence of cardiac pacemaker: Secondary | ICD-10-CM | POA: Diagnosis not present

## 2018-06-14 DIAGNOSIS — C3490 Malignant neoplasm of unspecified part of unspecified bronchus or lung: Secondary | ICD-10-CM

## 2018-06-14 DIAGNOSIS — R05 Cough: Secondary | ICD-10-CM

## 2018-06-14 DIAGNOSIS — Z7189 Other specified counseling: Secondary | ICD-10-CM

## 2018-06-14 LAB — CMP (CANCER CENTER ONLY)
ALT: 18 U/L (ref 0–44)
AST: 17 U/L (ref 15–41)
Albumin: 3.8 g/dL (ref 3.5–5.0)
Alkaline Phosphatase: 121 U/L (ref 38–126)
Anion gap: 11 (ref 5–15)
BUN: 5 mg/dL — ABNORMAL LOW (ref 6–20)
CO2: 27 mmol/L (ref 22–32)
Calcium: 9.2 mg/dL (ref 8.9–10.3)
Chloride: 103 mmol/L (ref 98–111)
Creatinine: 0.91 mg/dL (ref 0.61–1.24)
GFR, Est AFR Am: >60 mL/min (ref >60)
GFR, Estimated: >60 mL/min (ref >60)
Glucose, Bld: 99 mg/dL (ref 70–99)
Potassium: 3.8 mmol/L (ref 3.5–5.1)
Sodium: 141 mmol/L (ref 135–145)
Total Bilirubin: 1.1 mg/dL (ref 0.3–1.2)
Total Protein: 7.3 g/dL (ref 6.5–8.1)

## 2018-06-14 LAB — CBC WITH DIFFERENTIAL (CANCER CENTER ONLY)
Abs Immature Granulocytes: 0.04 10*3/uL (ref 0.00–0.07)
Basophils Absolute: 0.1 10*3/uL (ref 0.0–0.1)
Basophils Relative: 1 %
Eosinophils Absolute: 1.8 10*3/uL — ABNORMAL HIGH (ref 0.0–0.5)
Eosinophils Relative: 15 %
HCT: 53.9 % — ABNORMAL HIGH (ref 39.0–52.0)
Hemoglobin: 17.3 g/dL — ABNORMAL HIGH (ref 13.0–17.0)
Immature Granulocytes: 0 %
Lymphocytes Relative: 16 %
Lymphs Abs: 1.9 10*3/uL (ref 0.7–4.0)
MCH: 28.8 pg (ref 26.0–34.0)
MCHC: 32.1 g/dL (ref 30.0–36.0)
MCV: 89.7 fL (ref 80.0–100.0)
Monocytes Absolute: 0.8 10*3/uL (ref 0.1–1.0)
Monocytes Relative: 7 %
Neutro Abs: 7.4 10*3/uL (ref 1.7–7.7)
Neutrophils Relative %: 61 %
Platelet Count: 186 10*3/uL (ref 150–400)
RBC: 6.01 MIL/uL — ABNORMAL HIGH (ref 4.22–5.81)
RDW: 14.5 % (ref 11.5–15.5)
WBC Count: 12.1 10*3/uL — ABNORMAL HIGH (ref 4.0–10.5)
nRBC: 0 % (ref 0.0–0.2)

## 2018-06-14 MED ORDER — CYANOCOBALAMIN 1000 MCG/ML IJ SOLN
INTRAMUSCULAR | Status: AC
  Filled 2018-06-14: qty 1

## 2018-06-14 MED ORDER — CYANOCOBALAMIN 1000 MCG/ML IJ SOLN
1000.0000 ug | Freq: Once | INTRAMUSCULAR | Status: DC
  Administered 2018-06-14: 15:00:00 1000 ug via INTRAMUSCULAR

## 2018-06-14 MED ORDER — FOLIC ACID 1 MG PO TABS: 1.0000 mg | ORAL_TABLET | Freq: Every day | ORAL | 4 refills | Status: DC

## 2018-06-14 MED ORDER — PROCHLORPERAZINE MALEATE 10 MG PO TABS: 10.0000 mg | ORAL_TABLET | Freq: Four times a day (QID) | ORAL | 0 refills | Status: DC | PRN

## 2018-06-14 NOTE — Progress Notes (Signed)
The proposed treatment discussed in cancer conference 06/14/2018 is for discussion purpose only and is not a binding recommendation.  The patient was not physically examined nor present for their treatment options.  Therefore, final treatment plans cannot be decided.

## 2018-06-14 NOTE — Progress Notes (Signed)
START ON PATHWAY REGIMEN - Non-Small Cell Lung     A cycle is every 21 days:     Pembrolizumab      Pemetrexed      Carboplatin   **Always confirm dose/schedule in your pharmacy ordering system**  Patient Characteristics: Stage IV Metastatic, Nonsquamous, Initial Chemotherapy/Immunotherapy, PS = 0, 1, ALK Translocation Negative/Unknown and EGFR Mutation Negative/Non-Sensitizing/Unknown, PD-L1 Expression Positive 1-49% (TPS) / Negative / Not Tested / Awaiting Test Results  and Immunotherapy Candidate AJCC T Category: T1b Current Disease Status: Distant Metastases AJCC N Category: N0 AJCC M Category: M1a AJCC 8 Stage Grouping: IVA Histology: Nonsquamous Cell ROS1 Rearrangement Status: Negative T790M Mutation Status: Not Applicable - EGFR Mutation Negative/Unknown Other Mutations/Biomarkers: No Other Actionable Mutations NTRK Gene Fusion Status: Awaiting Test Results PD-L1 Expression Status: PD-L1 Negative Chemotherapy/Immunotherapy LOT: Initial Chemotherapy/Immunotherapy Molecular Targeted Therapy: Not Appropriate ALK Translocation Status: Negative EGFR Mutation Status: Negative/Wild Type BRAF V600E Mutation Status: Negative ECOG Performance Status: 1 Immunotherapy Candidate Status: Candidate for Immunotherapy Intent of Therapy: Non-Curative / Palliative Intent, Discussed with Patient

## 2018-06-14 NOTE — Progress Notes (Signed)
[employer] [phone]   [phone] Multidisciplinary thoracic oncology clinic  CONSULT NOTE  REFERRING PHYSICIAN: Dr. Thornell Mule  REASON FOR CONSULTATION:  58 years old white male recently diagnosed with lung cancer.  HPI Daniel Bryant is a 58 y.o. male a never smoker with past medical history significant for hypertension, dyslipidemia, obstructive sleep apnea, COPD, GERD, asthma, PTSD, sick sinus syndrome status post pacemaker placement.  The patient mentioned that he has been complaining of shortness of breath since early January 2020.  He was diagnosed and treated pneumonia.  Chest x-ray on April 04, 2018 showed large right pleural effusion with mild adjacent right lung atelectasis.  The patient underwent ultrasound-guided right thoracentesis yielding 2.0 L of pleural fluid.  The cytology from the fluid ([id number]) showed malignant cells consistent with metastatic adenocarcinoma. The tumor cells are postive for cytokeratin 7, TTF-1 and MOC-31(weak) but negative for cytokeratin 20, CDX2 and calretinin. These findings are consistent with a primary lung adenocarcinoma.  For some reason the patient continues on observation because he was receiving his care at the [facility] in [facility].  He had repeat thoracentesis again on April 08, 2018 with drainage of 1.7 L of pleural fluid.  Another thoracentesis was performed on 04/21/2018 with drainage of 2.4 L of pleural fluid.  He was seen by Dr. Prescott Gum but declined Pleurx catheter placement at that time.  The malignant cells from the pleural fluid were sent for molecular studies by [facility] and that showed no actionable mutations.  Finally the patient agreed to have a Port-A-Cath placed and this was done on May 13, 2018 with drainage of 2.5 L of malignant pleural effusion at that time.  He had CT scan of the head, chest, abdomen and pelvis performed on 05/01/2018 and that showed no evidence of  intracranial abnormality is identified.  The scan of the chest, abdomen and pelvis showed the right sided pleural and thoracic nodal metastasis.  The primary is suspicious to be a spiculated anterior right upper lobe pleural-based nodule.  There was a small to moderate right pleural effusion with secondary volume loss in the right lower lobe.  There was no findings of metastatic disease in the abdomen or pelvis. The patient was referred to the multidisciplinary thoracic oncology clinic today for evaluation and recommendation regarding treatment of his condition. When seen today the patient is feeling fine except for dry cough as well as shortness of breath with exertion when he accumulates the fluids he also has intermittent right-sided chest pain but no hemoptysis.  The patient denied having any nausea or vomiting but has few episodes of diarrhea with no constipation.  He denied having any headache or visual changes.  HPI  Past Medical History:  Diagnosis Date   Asthma    Cancer ([place])    COPD (chronic obstructive pulmonary disease) (HCC)    GERD (gastroesophageal reflux disease)    High cholesterol    History of petit-mal seizures    Hyperlipidemia    Hypertension    Memory impairment    OSA (obstructive sleep apnea)    Does not tolerate CPAP   Pacemaker    PTSD (post-traumatic stress disorder)    Sick sinus syndrome [facility])     Past Surgical History:  Procedure Laterality Date   CARDIAC SURGERY     CHEST TUBE INSERTION Right 05/16/2018   Procedure: INSERTION PLEURAL DRAINAGE CATHETER;  Surgeon: Ivin Poot, MD;  Location: [place];  Service:  Thoracic;  Laterality: Right;   CHEST TUBE INSERTION Right 05/16/2018   Procedure: Chest Tube Insertion;  Surgeon: Ivin Poot, MD;  Location: [facility] OR;  Service: Thoracic;  Laterality: Right;   PACEMAKER INSERTION      Family History  Problem Relation Age of Onset   Alzheimer's disease Mother    Cancer Mother    Memory  loss Father     Social History Social History   Tobacco Use   Smoking status: Former Smoker    Types: E-cigarettes   Smokeless tobacco: Current User    Types: Chew  Substance Use Topics   Alcohol use: Yes    Frequency: Never   Drug use: No    No Known Allergies  Current Outpatient Medications  Medication Sig Dispense Refill   albuterol (PROVENTIL) (2.5 MG/3ML) 0.083% nebulizer solution Take 3 mLs (2.5 mg total) by nebulization 2 (two) times daily as needed for wheezing or shortness of breath. 75 mL 0   aspirin EC 81 MG EC tablet Take 1 tablet (81 mg total) by mouth daily.     atorvastatin (LIPITOR) 40 MG tablet Take 40 mg by mouth at bedtime.     Cholecalciferol (VITAMIN D3) 25 MCG (1000 UT) CAPS Take 1,000 Units by mouth daily.     donepezil (ARICEPT) 5 MG tablet Take 5 mg by mouth at bedtime.     escitalopram (LEXAPRO) 20 MG tablet Take 20 mg by mouth daily.      lisinopril (PRINIVIL,ZESTRIL) 5 MG tablet Take 5 mg by mouth daily.      pantoprazole (PROTONIX) 40 MG tablet Take 1 tablet (40 mg total) by mouth daily. 30 tablet 0   vitamin B-12 (CYANOCOBALAMIN) 500 MCG tablet Take 500 mcg by mouth daily.     No current facility-administered medications for this visit.     Review of Systems  Constitutional: positive for fatigue Eyes: negative Ears, nose, mouth, throat, and face: negative Respiratory: positive for cough, dyspnea on exertion and pleurisy/chest pain Cardiovascular: negative Gastrointestinal: negative Genitourinary:negative Integument/breast: negative Hematologic/lymphatic: negative Musculoskeletal:negative Neurological: negative Behavioral/Psych: negative Endocrine: negative Allergic/Immunologic: negative  Physical Exam  XTG:GYIRS, healthy, no distress, well nourished and well developed SKIN: skin color, texture, turgor are normal, no rashes or significant lesions HEAD: Normocephalic, No masses, lesions, tenderness or abnormalities EYES:  normal, PERRLA, Conjunctiva are pink and non-injected EARS: External ears normal, Canals clear OROPHARYNX:no exudate, no erythema and lips, buccal mucosa, and tongue normal  NECK: supple, no adenopathy, no JVD LYMPH:  no palpable lymphadenopathy, no hepatosplenomegaly LUNGS: clear to auscultation , and palpation HEART: regular rate & rhythm, no murmurs and no gallops ABDOMEN:abdomen soft, non-tender and normal bowel sounds BACK: Back symmetric, no curvature., No CVA tenderness EXTREMITIES:no joint deformities, effusion, or inflammation, no edema  NEURO: alert & oriented x 3 with fluent speech, no focal motor/sensory deficits  PERFORMANCE STATUS: ECOG 1  LABORATORY DATA: Lab Results  Component Value Date   WBC 11.1 (H) 05/15/2018   HGB 17.0 05/15/2018   HCT 53.6 (H) 05/15/2018   MCV 89.2 05/15/2018   PLT 184 05/15/2018      Chemistry      Component Value Date/Time   NA 139 05/18/2018 0703   K 4.2 05/18/2018 0703   CL 101 05/18/2018 0703   CO2 30 05/18/2018 0703   BUN 10 05/18/2018 0703   CREATININE 1.19 05/18/2018 0703      Component Value Date/Time   CALCIUM 8.6 (L) 05/18/2018 0703   ALKPHOS 83 05/02/2018 0514  AST 27 05/02/2018 0514   ALT 24 05/02/2018 0514   BILITOT 1.3 (H) 05/02/2018 0514       RADIOGRAPHIC STUDIES: Dg Chest 2 View  Result Date: 05/15/2018 CLINICAL DATA:  Shortness of breath for 3 weeks. Follow up malignant RIGHT pleural effusion. EXAM: CHEST - 2 VIEW COMPARISON:  Chest radiograph May 10, 2018 FINDINGS: Re-accumulation moderate to large RIGHT pleural effusion. RIGHT lung base opacity seen with atelectasis, pneumonia or mass. No mediastinal shift. LEFT lung is clear. Cardiac silhouette is obscured on the RIGHT, limiting evaluation. Mediastinal silhouette is not suspicious. No pneumothorax. RIGHT cardiac pacemaker in situ. IMPRESSION: Reaccumulation of moderate to large RIGHT pleural effusion. Electronically Signed   By: Elon Alas M.D.    On: 05/15/2018 19:47   Dg Chest Portable 1 View  Result Date: 06/12/2018 CLINICAL DATA:  History of right-sided PleurX catheter. EXAM: PORTABLE CHEST 1 VIEW COMPARISON:  06/02/2018; 05/20/2018; 05/19/2018 FINDINGS: Grossly unchanged cardiac silhouette and mediastinal contours. Stable position of support apparatus with interval reduction in persistent trace right-sided effusion. No pneumothorax. No definite left-sided pleural effusion. Improved aeration right lung base with persistent right mid and lower lung heterogeneous opacities. No new focal airspace opacities. No definite evidence of edema.  No acute osseous abnormalities. IMPRESSION: Stable position of support apparatus with interval reduction in persistent trace right-sided pleural effusion. Electronically Signed   By: Sandi Mariscal M.D.   On: 06/12/2018 12:56   Dg Chest Portable 1 View  Result Date: 06/12/2018 CLINICAL DATA:  Worsening shortness of breath and cough. EXAM: PORTABLE CHEST 1 VIEW COMPARISON:  Chest x-ray dated May 20, 2018. FINDINGS: Unchanged right chest wall pacemaker and right-sided PleurX catheter. Stable cardiomediastinal silhouette. Increasing small right pleural effusion with adjacent right lower lobe atelectasis. The left lung is clear. No pneumothorax. No acute osseous abnormality. IMPRESSION: 1. Increasing small right pleural effusion with adjacent right lower lobe atelectasis. Unchanged right-sided PleurX catheter. Electronically Signed   By: Titus Dubin M.D.   On: 06/12/2018 10:59   Dg Chest Port 1 View  Result Date: 05/20/2018 CLINICAL DATA:  Follow-up chest tube removal EXAM: PORTABLE CHEST 1 VIEW COMPARISON:  05/19/2018 FINDINGS: Cardiac shadow is stable. Pacing device is again seen. PleurX catheter is noted over the right base. No definitive pneumothorax is seen. Minimal pleural fluid is noted laterally. No other focal abnormality is noted. IMPRESSION: Stable appearance of the chest from the previous day.  Electronically Signed   By: Inez Catalina M.D.   On: 05/20/2018 10:33   Dg Chest Port 1 View  Result Date: 05/19/2018 CLINICAL DATA:  Follow-up pneumothorax EXAM: PORTABLE CHEST 1 VIEW COMPARISON:  05/18/2018 FINDINGS: Cardiac shadows is stable. Pacing device is again seen. Right-sided chest tube has been removed in the interval. Residual inferior chest tube remains on the right. No sizable pneumothorax is seen. Considerable pleural thickening is noted laterally stable from the previous exam. The left lung is clear. IMPRESSION: Interval removal of 1 of the 2 right chest tubes. No significant recurrent pneumothorax is seen. Stable pleural thickening is noted on the right. Electronically Signed   By: Inez Catalina M.D.   On: 05/19/2018 08:46   Dg Chest Port 1 View  Result Date: 05/18/2018 CLINICAL DATA:  Chest tube EXAM: PORTABLE CHEST 1 VIEW COMPARISON:  05/17/2018 FINDINGS: Right chest tubes remain in place, unchanged. No pneumothorax. Stable right pacer. Cardiomegaly. Patchy airspace disease in the right lower lobe with small right pleural effusion or pleural thickening. No confluent opacity on the  left. No acute bony abnormality. IMPRESSION: No significant change.  No pneumothorax. Electronically Signed   By: Rolm Baptise M.D.   On: 05/18/2018 08:07   Dg Chest Port 1 View  Result Date: 05/17/2018 CLINICAL DATA:  Chest tube in place EXAM: PORTABLE CHEST 1 VIEW COMPARISON:  05/16/2018 FINDINGS: Two chest tubes on the right unchanged in position. No pneumothorax. Right lower lobe airspace disease with mild improvement. Right pleural effusion unchanged. Left lung remains clear IMPRESSION: Two chest tubes on the right remain in place. No pneumothorax. Mild improvement in right lower lobe airspace disease. Small right effusion unchanged. Electronically Signed   By: Franchot Gallo M.D.   On: 05/17/2018 07:32   Dg Chest Portable 1 View  Result Date: 05/16/2018 CLINICAL DATA:  Malignant right pleural effusion.  EXAM: PORTABLE CHEST 1 VIEW COMPARISON:  1624 hours FINDINGS: Additional right-sided chest tube is been placed with further re-expansion of the right lung and no significant residual pneumothorax identified. Basilar PleurX catheter shows stable positioning. No significant right pleural fluid. There remains atelectasis/consolidation of the medial right lower lobe. IMPRESSION: Re-expansion of the right lung after additional right chest tube placement with no significant residual pneumothorax identified. Electronically Signed   By: Aletta Edouard M.D.   On: 05/16/2018 17:12   Dg Chest Portable 1 View  Result Date: 05/16/2018 CLINICAL DATA:  Pleural catheter placement EXAM: PORTABLE CHEST 1 VIEW COMPARISON:  05/15/2018 FINDINGS: A right tunneled pleural drain has been placed and the right pleural effusion has resolved. A right pneumothorax has developed. Overall, the amount of pleural air is less than the amount of pleural fluid seen previously and the lung is better expanded. There is no shift of the mediastinum. Lungs are under aerated with vascular crowding. Do lead right subclavian pacemaker device is stable. Upper normal heart size. IMPRESSION: Tunneled right pleural drain placed with resolution of the pleural fluid and a small ensuing basilar pneumothorax. Electronically Signed   By: Marybelle Killings M.D.   On: 05/16/2018 16:45   Dg C-arm 1-60 Min-no Report  Result Date: 05/16/2018 Fluoroscopy was utilized by the requesting physician.  No radiographic interpretation.    ASSESSMENT: This is a very pleasant 58 years old never smoker with recently diagnosed stage IV (T1b, N2, M1) non-small cell lung cancer, adenocarcinoma diagnosed in January 2020.  He presented with right upper lobe lung nodule in addition to mediastinal lymphadenopathy and malignant right pleural effusion.   PLAN: I had a lengthy discussion with the patient today about his current disease stage, prognosis and treatment options. I  personally and independently reviewed the scan images and discussed the result and showed the images to the patient today. I recommended for the patient to have a PET scan for further evaluation of his disease and to rule out any other metastatic disease in the chest or outside the chest. I will also request for the patient to have blood test sent to [facility] for comprehensive molecular studies to rule out any actionable mutation in this patient with a never smoking history. If the patient has no actionable mutations, I will arrange for him to start systemic chemotherapy with carboplatin for AUC of 5, Alimta 500 mg/M2 and Keytruda 200 mg IV every 3 weeks.  He will be expected to start the first cycle of this treatment on June 27, 2018. I discussed with the patient the adverse effect of this treatment including but not limited to alopecia, myelosuppression, nausea and vomiting, peripheral neuropathy, liver or renal dysfunction.  I also discussed with him the adverse effect of the immunotherapy.  He received a handout and chemo education from the thoracic navigator. The patient received vitamin B12 injection today. I will send a prescription to his pharmacy for folic acid 1 mg p.o. daily in addition to Compazine 10 mg p.o. every 6 hours as needed for nausea. I will arrange for the patient to come back for follow-up visit in 2 weeks before starting the first cycle of his treatment. The patient was advised to call immediately if he has any concerning symptoms in the interval. The patient voices understanding of current disease status and treatment options and is in agreement with the current care plan.  All questions were answered. The patient knows to call the clinic with any problems, questions or concerns. We can certainly see the patient much sooner if necessary.  Thank you so much for allowing me to participate in the care of Daniel Bryant. I will continue to follow up the patient with you and  assist in his care.  I spent 55 minutes counseling the patient face to face. The total time spent in the appointment was 80 minutes.  Disclaimer: This note was dictated with voice recognition software. Similar sounding words can inadvertently be transcribed and may not be corrected upon review.   Eilleen Kempf June 14, 2018, 2:12 PM

## 2018-06-14 NOTE — Telephone Encounter (Signed)
Reviewed plan of care with case manager, Lattie Haw.

## 2018-06-15 ENCOUNTER — Telehealth: Payer: Self-pay | Admitting: Internal Medicine

## 2018-06-15 NOTE — Telephone Encounter (Signed)
Tried to reach patient was unavailble

## 2018-06-18 ENCOUNTER — Encounter: Payer: Self-pay | Admitting: *Deleted

## 2018-06-18 DIAGNOSIS — C3491 Malignant neoplasm of unspecified part of right bronchus or lung: Secondary | ICD-10-CM

## 2018-06-18 NOTE — Progress Notes (Signed)
Oncology Nurse Navigator Documentation  Oncology Nurse Navigator Flowsheets 06/18/2018  Navigator Location [facility]  Referral date to RadOnc/MedOnc -  Navigator Encounter Type Clinic/MDC/I spoke with patient at thoracic clinic on 06/14/2018.  I gave and explained dx and treatment plan.  I completed chemo education.   Today, I checked on his appts.  He is scheduled for chemo ed.  I will have scheduling call and cancel this due to this has been completed.  I will also update transportation that he does not need a ride that day.  I will reach out to authorization to get his PET scan authorized.   Telephone -  Abnormal Finding Date 04/08/2018  Confirmed Diagnosis Date 05/10/2018  Multidisiplinary Clinic Date 06/14/2018  Treatment Initiated Date 06/27/2018  Patient Visit Type MedOnc  Treatment Phase Pre-Tx/Tx Discussion  Barriers/Navigation Needs Education;Coordination of Care  Education Newly Diagnosed Cancer Education;Other  Interventions Coordination of Care;Education  Coordination of Care Other  Education Method Verbal;Written  Acuity Level 2  Acuity Level 2 Educational needs  Time Spent with Patient 37

## 2018-06-19 ENCOUNTER — Encounter: Payer: Self-pay | Admitting: General Practice

## 2018-06-19 ENCOUNTER — Encounter: Payer: Self-pay | Admitting: *Deleted

## 2018-06-19 NOTE — Progress Notes (Signed)
[place] CSW Progress Notes  Referral received from nurse navigator, concerned that patient may not be able to remember dates/appointments/care needs.  Concerned that he will not be able to manage care needs during cancer treatment which will put increased stress on his ability to manage care needs.  [facility] [phone]).  [facility] has letter from neurologist from approx one year ago stating that "patient should not live on his own."  Since that time [name] has made two APS referrals - first referral was investigated and then declined as "he has food, shelter, is making his appointments without any problem."  Second referral was screened out prior to any investigation as APS determined that patient does not have any needs.  Currently lives in tiny house community after being placed there after two years at [facility].  [facility] CSW has talked to [facility] oncologist, [place] PACT workers, and staffed case on multiple occasions.  [facility] CSW has worked w neighbors (male and male) to help him get to appointments, get food and daily needs met.  Currently has no car.  Patient has been visited by his pastor who has provided food for him.  [place] caseworker [facility] Daniel Bryant) has bowed out of involvement in case as patient is no longer eligible for their services.  Per [facility] CSW, patients difficulties w short term memory are significant.Daniel Bryant remember appointments, how to get to grocery store, what needs to be done for self care.  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    Current plan by [facility] CSW is to have patient get psychological evaluation re competence/capacity in preparation for a determination of whether he can manage his own affairs and remain living independently in community.  Patient needs to be able to remember appointment and get to appt on his own.  [facility] cannot provide transportation at this time due to  [place].  This evaluation is needed in order proceed with any attempts to place patient. To this point, he has declined assisted living, in home care services and all other ways to provide additional support for independent living.  [facility] CSW will staff case w her supervisors and try to find alternative arrangements to help patient meet the demands of cancer treatment.  Information conveyed to nurse navigator.  Edwyna Shell, LCSW Edwyna Shell, LCSW Clinical Social Worker Phone:  [phone]  Clinical Social Worker Phone:  [phone]

## 2018-06-19 NOTE — Progress Notes (Signed)
[place] Psychosocial Distress Screening Clinical Social Work  Clinical Social Work was referred by distress screening protocol.  The patient scored a 9 on the Psychosocial Distress Thermometer which indicates severe distress. Clinical Social Worker contacted patient by phone to assess for distress and other psychosocial needs. Unable to reach, unable to leave VM.  Received VM from nurse navigator re possible psychosocial needs.  Per chart review, pt also has a case Freight forwarder and may be involved w the [place].  As this is Dr Lew Dawes patient, CSW Wallis Bamberg will be messaged for possible follow up and assistance needed.    ONCBCN DISTRESS SCREENING 06/18/2018  Screening Type Initial Screening  Distress experienced in past week (1-10) 9  Practical problem type Transportation  Emotional problem type Depression;Nervousness/Anxiety;Adjusting to illness  Information Concerns Type Lack of info about treatment;Lack of info about complementary therapy choices;Lack of info about maintaining fitness  Physical Problem type Nausea/vomiting;Sleep/insomnia;Getting around;Breathing  Referral to clinical social work Yes    Clinical Social Worker follow up needed: Yes.   CSW Wallis Bamberg to assess and determine follow up needs.    If yes, follow up plan:  Beverely Pace, [place], LCSW Clinical Social Worker Phone:  [phone]

## 2018-06-19 NOTE — Progress Notes (Signed)
Oncology Nurse Navigator Documentation  Oncology Nurse Navigator Flowsheets 06/19/2018  Navigator Location [facility]  Referral date to RadOnc/MedOnc -  Navigator Encounter Type Other/I spoke with Edwyna Shell CSW regarding getting Daniel Bryant services to help him through treatment.  She asked that I update CSW with the [facility] on patients upcoming schedule for treatment.  I updated her.   Telephone -  Abnormal Finding Date -  Confirmed Diagnosis Date -  Multidisiplinary Clinic Date -  Treatment Initiated Date -  Patient Visit Type -  Treatment Phase Pre-Tx/Tx Discussion  Barriers/Navigation Needs Coordination of Care  Education -  Interventions Coordination of Care  Coordination of Care Other  Education Method -  Acuity Level 2  Acuity Level 2 -  Time Spent with Patient 30

## 2018-06-20 ENCOUNTER — Encounter: Payer: Self-pay | Admitting: *Deleted

## 2018-06-20 NOTE — Progress Notes (Signed)
Oncology Nurse Navigator Documentation  Oncology Nurse Navigator Flowsheets 06/20/2018  Navigator Location [facility]  Referral date to RadOnc/MedOnc -  Navigator Encounter Type Other/recieved a message from Dr. Julien Nordmann that Mr. Ticas PET scan has been authorized.  I called patient central scheduling and was able to get an appt.  I called patient but was unable to reach. I called his neighbor but was unable to reach her.  I left her a vm message for her to call me.    Telephone -  Abnormal Finding Date -  Confirmed Diagnosis Date -  Multidisiplinary Clinic Date -  Treatment Initiated Date -  Patient Visit Type -  Treatment Phase Pre-Tx/Tx Discussion  Barriers/Navigation Needs Coordination of Care  Education -  Interventions Coordination of Care  Coordination of Care Appts  Education Method -  Acuity Level 3  Acuity Level 2 -  Time Spent with Patient 57

## 2018-06-21 ENCOUNTER — Encounter: Payer: Self-pay | Admitting: General Practice

## 2018-06-21 ENCOUNTER — Other Ambulatory Visit: Payer: Medicaid Other

## 2018-06-21 ENCOUNTER — Telehealth: Payer: Self-pay | Admitting: *Deleted

## 2018-06-21 NOTE — Progress Notes (Signed)
[place] CSW Progress Notes  Patient has a [facility] CSW at [place] [phone]).  She is aware of patient's needs in the community and can serve as a resource for planning.  Edwyna Shell, LCSW Clinical Social Worker Phone:  [phone]

## 2018-06-21 NOTE — Telephone Encounter (Signed)
Oncology Nurse Navigator Documentation  Oncology Nurse Navigator Flowsheets 06/21/2018  Navigator Location [facility]  Referral date to RadOnc/MedOnc -  Navigator Encounter Type Telephone/I called patient to update him on PET appt, but was unable to reach or leave vm message.  I called patient's neighbor and left a vm message to call me.    Telephone Outgoing Call  Abnormal Finding Date -  Confirmed Diagnosis Date -  Multidisiplinary Clinic Date -  Treatment Initiated Date -  Patient Visit Type -  Treatment Phase Pre-Tx/Tx Discussion  Barriers/Navigation Needs Education  Education Other  Interventions Education  Coordination of Care -  Education Method Verbal  Acuity Level 2  Acuity Level 2 -  Time Spent with Patient -

## 2018-06-22 ENCOUNTER — Telehealth: Payer: Self-pay | Admitting: *Deleted

## 2018-06-22 LAB — GUARDANT 360

## 2018-06-22 NOTE — Telephone Encounter (Signed)
Oncology Nurse Navigator Documentation  Oncology Nurse Navigator Flowsheets 06/22/2018  Navigator Location [facility]  Referral date to RadOnc/MedOnc -  Navigator Encounter Type Telephone/I received a call from patients neighbor that I could call Mr. Calabretta and he would answer phone if I called now.  I called him right away but he did not answer his phone.  I was unable to leave a vm message either.    Telephone Incoming Call;Outgoing Call  Abnormal Finding Date -  Confirmed Diagnosis Date -  Multidisiplinary Clinic Date -  Treatment Initiated Date -  Patient Visit Type -  Treatment Phase Pre-Tx/Tx Discussion  Barriers/Navigation Needs Education  Education Other  Interventions Education  Coordination of Care -  Education Method Verbal  Acuity Level 2  Acuity Level 2 -  Time Spent with Patient 15

## 2018-06-25 ENCOUNTER — Other Ambulatory Visit: Payer: Self-pay | Admitting: Medical Oncology

## 2018-06-25 NOTE — Progress Notes (Signed)
Vitamin b 12 injection given 06/14/2018.

## 2018-06-27 ENCOUNTER — Encounter: Payer: Self-pay | Admitting: *Deleted

## 2018-06-27 ENCOUNTER — Telehealth: Payer: Self-pay | Admitting: Internal Medicine

## 2018-06-27 ENCOUNTER — Inpatient Hospital Stay: Payer: No Typology Code available for payment source | Admitting: Internal Medicine

## 2018-06-27 ENCOUNTER — Inpatient Hospital Stay: Payer: No Typology Code available for payment source

## 2018-06-27 ENCOUNTER — Telehealth: Payer: Self-pay | Admitting: *Deleted

## 2018-06-27 NOTE — Telephone Encounter (Signed)
Tried to call patient per 4/15 sch message - no answer and no vmail . msg sent to MD and RN

## 2018-06-27 NOTE — Progress Notes (Signed)
Oncology Nurse Navigator Documentation  Oncology Nurse Navigator Flowsheets 06/27/2018  Navigator Location [facility]  Referral date to RadOnc/MedOnc -  Navigator Encounter Type Telephone/I received a message from Dr. Worthy Flank nurse today.  Patient was a no show to his appt today.  I called [facility] Maggie.  She states that Daniel Bryant was notified via phone of his appt today at the cancer center and in person of the appt.  She asked about his transportation.  I updated her that his transportation was arranged.  I called our coordinator for transportation to get an update on issue.  I called Den[place] but was unable to reach him.  I then called patient' neighbor and she states she will speak to him and figure out why he is not answer his phone and a no show to appts.  I will updated [name] with CSW to see what else can be done for patient.   Telephone Outgoing Call  Abnormal Finding Date -  Confirmed Diagnosis Date -  Multidisiplinary Clinic Date -  Treatment Initiated Date -  Patient Visit Type -  Treatment Phase Pre-Tx/Tx Discussion  Barriers/Navigation Needs Education;Coordination of Care  Education Other  Interventions Coordination of Care;Education  Coordination of Care Other  Education Method Verbal  Acuity Level 2  Acuity Level 2 -  Time Spent with Patient 59

## 2018-06-27 NOTE — Telephone Encounter (Signed)
TCT patient as he did not show up for labs. MD appt and 1st time treatment for his lung cancer. The phone # provided would not go through with several attempts.  No answer and no VM available for emergency contact. Will cancel appts for today and send rescheduling message

## 2018-06-28 ENCOUNTER — Telehealth: Payer: Self-pay

## 2018-06-28 ENCOUNTER — Encounter: Payer: Self-pay | Admitting: *Deleted

## 2018-06-28 ENCOUNTER — Telehealth: Payer: Self-pay | Admitting: Internal Medicine

## 2018-06-28 NOTE — Telephone Encounter (Signed)
[phone] ext [phone], contact Maggie Southern/Social Worker  Trying to get PleurX supplies Patient has medicaid and [employer] He was given pleurX kits from office but will need to have home health get supplies

## 2018-06-28 NOTE — Telephone Encounter (Signed)
Called pt - unable to reach or leave message per 4/16  sch message.

## 2018-06-28 NOTE — Progress Notes (Signed)
I spoke with patient's neighbor.  She will bring him to his appt on 07/04/2018

## 2018-06-28 NOTE — Progress Notes (Signed)
Oncology Nurse Navigator Documentation  Oncology Nurse Navigator Flowsheets 06/28/2018  Navigator Location [facility]  Referral date to RadOnc/MedOnc -  Navigator Encounter Type Other/I updated scheduling team to call and re-schedule Daniel Bryant missed appts.  I received a call from [place] at [place].  She needs some help getting an order for pleur x cath supplies.  I gave her the number for [facility] office for the order.    Telephone -  Abnormal Finding Date -  Confirmed Diagnosis Date -  Multidisiplinary Clinic Date -  Treatment Initiated Date -  Patient Visit Type -  Treatment Phase Pre-Tx/Tx Discussion  Barriers/Navigation Needs Coordination of Care  Education -  Interventions Coordination of Care  Coordination of Care Other  Education Method -  Acuity Level 2  Acuity Level 2 -  Time Spent with Patient 30

## 2018-06-28 NOTE — Telephone Encounter (Signed)
Scheduled appt for 4/22 - left Navigator Hinton Dyer know - she will try to get in contact with patient

## 2018-06-29 ENCOUNTER — Telehealth: Payer: Self-pay | Admitting: *Deleted

## 2018-06-29 NOTE — Telephone Encounter (Signed)
[place] Work  Clinical Social Work was referred by Development worker, international aid.  CSW contacted [place], to obtain more information.  CSW Southern explained current situation and lack of resources to address patient's psychosocial needs.  Patient is scheduled for telephonic neurologic evaluation on 07/06/18.  CSW plans to meet with patient at first infusion appointment next week.   Clinical Social Worker [facility]

## 2018-07-03 ENCOUNTER — Encounter: Payer: Self-pay | Admitting: *Deleted

## 2018-07-03 NOTE — Progress Notes (Signed)
Oncology Nurse Navigator Documentation  Oncology Nurse Navigator Flowsheets 07/03/2018  [facility]igator Location [facility]  Referral date to RadOnc/MedOnc -  Navigator Encounter Type Other/I received a message from [place] needing help with pleur x cath bottles.  I contacted thoracic office, Alphonsa Overall, and [place] [facility] navigator.  [facility] and updated [facility].  I was unable to reach her but did leave vm message with my name and phone number to call.   Telephone -  Abnormal Finding Date -  Confirmed Diagnosis Date -  Multidisiplinary Clinic Date -  Treatment Initiated Date -  Patient Visit Type -  Treatment Phase Pre-Tx/Tx Discussion  Barriers/Navigation Needs Coordination of Care  Education -  Interventions Coordination of Care  Coordination of Care Other  Education Method -  Acuity Level 3  Acuity Level 2 -  Time Spent with Patient > 120

## 2018-07-04 ENCOUNTER — Encounter: Payer: Self-pay | Admitting: *Deleted

## 2018-07-04 ENCOUNTER — Other Ambulatory Visit: Payer: Medicaid Other

## 2018-07-04 ENCOUNTER — Inpatient Hospital Stay (HOSPITAL_BASED_OUTPATIENT_CLINIC_OR_DEPARTMENT_OTHER): Payer: No Typology Code available for payment source | Admitting: Medical

## 2018-07-04 ENCOUNTER — Other Ambulatory Visit: Payer: Self-pay | Admitting: Medical

## 2018-07-04 ENCOUNTER — Telehealth: Payer: Self-pay | Admitting: Medical Oncology

## 2018-07-04 ENCOUNTER — Other Ambulatory Visit: Payer: Self-pay

## 2018-07-04 ENCOUNTER — Other Ambulatory Visit: Payer: Self-pay | Admitting: Medical Oncology

## 2018-07-04 ENCOUNTER — Inpatient Hospital Stay: Payer: No Typology Code available for payment source

## 2018-07-04 ENCOUNTER — Other Ambulatory Visit: Payer: Self-pay | Admitting: Internal Medicine

## 2018-07-04 ENCOUNTER — Encounter (HOSPITAL_COMMUNITY): Payer: Medicaid Other

## 2018-07-04 VITALS — BP 116/69 | HR 88 | Temp 98.3°F | Resp 18 | Wt 240.5 lb

## 2018-07-04 DIAGNOSIS — J359 Chronic disease of tonsils and adenoids, unspecified: Secondary | ICD-10-CM | POA: Diagnosis not present

## 2018-07-04 DIAGNOSIS — C3491 Malignant neoplasm of unspecified part of right bronchus or lung: Secondary | ICD-10-CM

## 2018-07-04 DIAGNOSIS — I10 Essential (primary) hypertension: Secondary | ICD-10-CM | POA: Diagnosis not present

## 2018-07-04 DIAGNOSIS — Z5111 Encounter for antineoplastic chemotherapy: Secondary | ICD-10-CM

## 2018-07-04 DIAGNOSIS — J039 Acute tonsillitis, unspecified: Secondary | ICD-10-CM

## 2018-07-04 DIAGNOSIS — R110 Nausea: Secondary | ICD-10-CM

## 2018-07-04 DIAGNOSIS — Z5112 Encounter for antineoplastic immunotherapy: Secondary | ICD-10-CM | POA: Diagnosis not present

## 2018-07-04 DIAGNOSIS — J029 Acute pharyngitis, unspecified: Secondary | ICD-10-CM

## 2018-07-04 LAB — CMP (CANCER CENTER ONLY)
ALT: 18 U/L (ref 0–44)
AST: 16 U/L (ref 15–41)
Albumin: 3.8 g/dL (ref 3.5–5.0)
Alkaline Phosphatase: 111 U/L (ref 38–126)
Anion gap: 11 (ref 5–15)
BUN: 9 mg/dL (ref 6–20)
CO2: 23 mmol/L (ref 22–32)
Calcium: 9.1 mg/dL (ref 8.9–10.3)
Chloride: 105 mmol/L (ref 98–111)
Creatinine: 0.86 mg/dL (ref 0.61–1.24)
GFR, Est AFR Am: >60 mL/min (ref >60)
GFR, Estimated: >60 mL/min (ref >60)
Glucose, Bld: 104 mg/dL — ABNORMAL HIGH (ref 70–99)
Potassium: 3.3 mmol/L — ABNORMAL LOW (ref 3.5–5.1)
Sodium: 139 mmol/L (ref 135–145)
Total Bilirubin: 0.7 mg/dL (ref 0.3–1.2)
Total Protein: 7.3 g/dL (ref 6.5–8.1)

## 2018-07-04 LAB — CBC WITH DIFFERENTIAL (CANCER CENTER ONLY)
Abs Immature Granulocytes: 0.03 10*3/uL (ref 0.00–0.07)
Basophils Absolute: 0.2 10*3/uL — ABNORMAL HIGH (ref 0.0–0.1)
Basophils Relative: 1 %
Eosinophils Absolute: 2.2 10*3/uL — ABNORMAL HIGH (ref 0.0–0.5)
Eosinophils Relative: 17 %
HCT: 53.1 % — ABNORMAL HIGH (ref 39.0–52.0)
Hemoglobin: 17.4 g/dL — ABNORMAL HIGH (ref 13.0–17.0)
Immature Granulocytes: 0 %
Lymphocytes Relative: 16 %
Lymphs Abs: 2.1 10*3/uL (ref 0.7–4.0)
MCH: 29 pg (ref 26.0–34.0)
MCHC: 32.8 g/dL (ref 30.0–36.0)
MCV: 88.6 fL (ref 80.0–100.0)
Monocytes Absolute: 0.8 10*3/uL (ref 0.1–1.0)
Monocytes Relative: 6 %
Neutro Abs: 7.8 10*3/uL — ABNORMAL HIGH (ref 1.7–7.7)
Neutrophils Relative %: 60 %
Platelet Count: 223 10*3/uL (ref 150–400)
RBC: 5.99 MIL/uL — ABNORMAL HIGH (ref 4.22–5.81)
RDW: 13.6 % (ref 11.5–15.5)
WBC Count: 13 10*3/uL — ABNORMAL HIGH (ref 4.0–10.5)
nRBC: 0 % (ref 0.0–0.2)

## 2018-07-04 LAB — TSH: TSH: 2.324 u[IU]/mL (ref 0.320–4.118)

## 2018-07-04 MED ORDER — CLONIDINE HCL 0.1 MG PO TABS
ORAL_TABLET | ORAL | Status: AC
  Filled 2018-07-04: qty 2

## 2018-07-04 MED ORDER — SODIUM CHLORIDE 0.9 % IV SOLN
750.0000 mg | Freq: Once | INTRAVENOUS | Status: AC
  Administered 2018-07-04: 750 mg via INTRAVENOUS
  Filled 2018-07-04: qty 75

## 2018-07-04 MED ORDER — CLONIDINE HCL 0.1 MG PO TABS: 0.2000 mg | ORAL_TABLET | Freq: Once | ORAL | Status: DC

## 2018-07-04 MED ORDER — PALONOSETRON HCL INJECTION 0.25 MG/5ML
INTRAVENOUS | Status: AC
  Filled 2018-07-04: qty 5

## 2018-07-04 MED ORDER — LIDOCAINE VISCOUS HCL 2 % MT SOLN: 5.0000 mL | OROMUCOSAL | 1 refills | Status: DC | PRN

## 2018-07-04 MED ORDER — PROCHLORPERAZINE MALEATE 10 MG PO TABS: 10.0000 mg | ORAL_TABLET | Freq: Four times a day (QID) | ORAL | 0 refills | Status: DC | PRN

## 2018-07-04 MED ORDER — CLONIDINE HCL 0.1 MG PO TABS
0.2000 mg | ORAL_TABLET | Freq: Once | ORAL | Status: AC
  Administered 2018-07-04: 12:00:00 0.2 mg via ORAL

## 2018-07-04 MED ORDER — SODIUM CHLORIDE 0.9 % IV SOLN
Freq: Once | INTRAVENOUS | Status: AC
  Administered 2018-07-04: 12:00:00 via INTRAVENOUS
  Filled 2018-07-04: qty 5

## 2018-07-04 MED ORDER — SODIUM CHLORIDE 0.9 % IV SOLN
200.0000 mg | Freq: Once | INTRAVENOUS | Status: AC
  Administered 2018-07-04: 200 mg via INTRAVENOUS
  Filled 2018-07-04: qty 8

## 2018-07-04 MED ORDER — ALUM & MAG HYDROXIDE-SIMETH 200-200-20 MG/5ML PO SUSP: 30.0000 mL | Freq: Once | ORAL | Status: DC

## 2018-07-04 MED ORDER — SODIUM CHLORIDE 0.9 % IV SOLN
Freq: Once | INTRAVENOUS | Status: AC
  Administered 2018-07-04: 12:00:00 via INTRAVENOUS
  Filled 2018-07-04: qty 250

## 2018-07-04 MED ORDER — AMOXICILLIN-POT CLAVULANATE 875-125 MG PO TABS: 1.0000 | ORAL_TABLET | Freq: Two times a day (BID) | ORAL | 0 refills | Status: DC

## 2018-07-04 MED ORDER — SODIUM CHLORIDE 0.9 % IV SOLN
510.0000 mg/m2 | Freq: Once | INTRAVENOUS | Status: AC
  Administered 2018-07-04: 14:00:00 1200 mg via INTRAVENOUS
  Filled 2018-07-04: qty 40

## 2018-07-04 MED ORDER — PALONOSETRON HCL INJECTION 0.25 MG/5ML
0.2500 mg | Freq: Once | INTRAVENOUS | Status: AC
  Administered 2018-07-04: 12:00:00 0.25 mg via INTRAVENOUS

## 2018-07-04 MED ORDER — LIDOCAINE VISCOUS HCL 2 % MT SOLN
15.0000 mL | Freq: Once | OROMUCOSAL | Status: DC
  Filled 2018-07-04: qty 15

## 2018-07-04 MED ORDER — LIDOCAINE VISCOUS HCL 2 % MT SOLN: 15.0000 mL | Freq: Once | OROMUCOSAL | Status: DC

## 2018-07-04 NOTE — Telephone Encounter (Signed)
[facility]

## 2018-07-04 NOTE — Progress Notes (Signed)
Oncology Nurse Navigator Documentation  Oncology Nurse Navigator Flowsheets 07/04/2018  Navigator Location [facility]  Referral date to RadOnc/MedOnc -  Navigator Encounter Type Other/I received a call from [place].  I called her back but was unable to reach.  I did leave vm message with my name and phone number to call if needed.   Telephone -  Abnormal Finding Date -  Confirmed Diagnosis Date -  Multidisiplinary Clinic Date -  Treatment Initiated Date -  Patient Visit Type -  Treatment Phase Pre-Tx/Tx Discussion  Barriers/Navigation Needs Coordination of Care  Education -  Interventions Coordination of Care  Coordination of Care Other  Education Method -  Acuity Level 1  Acuity Level 2 -  Time Spent with Patient > 120

## 2018-07-04 NOTE — Progress Notes (Signed)
Pt c/o of sore throat. Large white plaque noted on left side of throat. BP 156/111. [name] PA-C at chair side evaluating patient and providing orders.  Ok to treat per Dr. Julien Nordmann

## 2018-07-04 NOTE — Patient Instructions (Signed)
[facility] Discharge Instructions for Patients Receiving Chemotherapy  Today you received the following chemotherapy agents Keytruda, Alimta, Carboplatin  To help prevent nausea and vomiting after your treatment, we encourage you to take your nausea medication as directed.  If you develop nausea and vomiting that is not controlled by your nausea medication, call the clinic.   BELOW ARE SYMPTOMS THAT SHOULD BE REPORTED IMMEDIATELY:  *FEVER GREATER THAN 100.5 F  *CHILLS WITH OR WITHOUT FEVER  NAUSEA AND VOMITING THAT IS NOT CONTROLLED WITH YOUR NAUSEA MEDICATION  *UNUSUAL SHORTNESS OF BREATH  *UNUSUAL BRUISING OR BLEEDING  TENDERNESS IN MOUTH AND THROAT WITH OR WITHOUT PRESENCE OF ULCERS  *URINARY PROBLEMS  *BOWEL PROBLEMS  UNUSUAL RASH Items with * indicate a potential emergency and should be followed up as soon as possible.  Feel free to call the clinic should you have any questions or concerns. The clinic phone number is [phone].  Please show the [facility] at check-in to the Emergency Department and triage nurse.  Pembrolizumab injection(Keytruda)   What is this medicine? PEMBROLIZUMAB (pem broe liz ue mab) is a monoclonal antibody. It is used to treat cervical cancer, esophageal cancer, head and neck cancer, hepatocellular cancer, Hodgkin lymphoma, kidney cancer, lymphoma, melanoma, Merkel cell carcinoma, lung cancer, stomach cancer, urothelial cancer, and cancers that have a certain genetic condition. This medicine may be used for other purposes; ask your health care provider or pharmacist if you have questions. COMMON BRAND NAME(S): Keytruda What should I tell my health care provider before I take this medicine? They need to know if you have any of these conditions: -diabetes -immune system problems -inflammatory bowel disease -liver disease -lung or breathing disease -lupus -received or scheduled to receive an organ transplant or a  stem-cell transplant that uses donor stem cells -an unusual or allergic reaction to pembrolizumab, other medicines, foods, dyes, or preservatives -pregnant or trying to get pregnant -breast-feeding How should I use this medicine? This medicine is for infusion into a vein. It is given by a health care professional in a hospital or clinic setting. A special MedGuide will be given to you before each treatment. Be sure to read this information carefully each time. Talk to your pediatrician regarding the use of this medicine in children. While this drug may be prescribed for selected conditions, precautions do apply. Overdosage: If you think you have taken too much of this medicine contact a poison control center or emergency room at once. NOTE: This medicine is only for you. Do not share this medicine with others. What if I miss a dose? It is important not to miss your dose. Call your doctor or health care professional if you are unable to keep an appointment. What may interact with this medicine? Interactions have not been studied. Give your health care provider a list of all the medicines, herbs, non-prescription drugs, or dietary supplements you use. Also tell them if you smoke, drink alcohol, or use illegal drugs. Some items may interact with your medicine. This list may not describe all possible interactions. Give your health care provider a list of all the medicines, herbs, non-prescription drugs, or dietary supplements you use. Also tell them if you smoke, drink alcohol, or use illegal drugs. Some items may interact with your medicine. What should I watch for while using this medicine? Your condition will be monitored carefully while you are receiving this medicine. You may need blood work done while you are taking this medicine. Do not  become pregnant while taking this medicine or for 4 months after stopping it. Women should inform their doctor if they wish to become pregnant or think they  might be pregnant. There is a potential for serious side effects to an unborn child. Talk to your health care professional or pharmacist for more information. Do not breast-feed an infant while taking this medicine or for 4 months after the last dose. What side effects may I notice from receiving this medicine? Side effects that you should report to your doctor or health care professional as soon as possible: -allergic reactions like skin rash, itching or hives, swelling of the face, lips, or tongue -bloody or black, tarry -breathing problems -changes in vision -chest pain -chills -confusion -constipation -cough -diarrhea -dizziness or feeling faint or lightheaded -fast or irregular heartbeat -fever -flushing -hair loss -joint pain -low blood counts - this medicine may decrease the number of white blood cells, red blood cells and platelets. You may be at increased risk for infections and bleeding. -muscle pain -muscle weakness -persistent headache -redness, blistering, peeling or loosening of the skin, including inside the mouth -signs and symptoms of high blood sugar such as dizziness; dry mouth; dry skin; fruity breath; nausea; stomach pain; increased hunger or thirst; increased urination -signs and symptoms of kidney injury like trouble passing urine or change in the amount of urine -signs and symptoms of liver injury like dark urine, light-colored stools, loss of appetite, nausea, right upper belly pain, yellowing of the eyes or skin -sweating -swollen lymph nodes -weight loss Side effects that usually do not require medical attention (report to your doctor or health care professional if they continue or are bothersome): -decreased appetite -muscle pain -tiredness This list may not describe all possible side effects. Call your doctor for medical advice about side effects. You may report side effects to FDA at 1-800-FDA-1088. Where should I keep my medicine? This drug is given  in a hospital or clinic and will not be stored at home. NOTE: This sheet is a summary. It may not cover all possible information. If you have questions about this medicine, talk to your doctor, pharmacist, or health care provider.  2019 Elsevier/Gold Standard (2017-10-12 15:06:10)  Pemetrexed injection(Alimta) What is this medicine? PEMETREXED (PEM e TREX ed) is a chemotherapy drug used to treat lung cancers like non-small cell lung cancer and mesothelioma. It may also be used to treat other cancers. This medicine may be used for other purposes; ask your health care provider or pharmacist if you have questions. COMMON BRAND NAME(S): Alimta What should I tell my health care provider before I take this medicine? They need to know if you have any of these conditions: -infection (especially a virus infection such as chickenpox, cold sores, or herpes) -kidney disease -low blood counts, like low white cell, platelet, or red cell counts -lung or breathing disease, like asthma -radiation therapy -an unusual or allergic reaction to pemetrexed, other medicines, foods, dyes, or preservative -pregnant or trying to get pregnant -breast-feeding How should I use this medicine? This drug is given as an infusion into a vein. It is administered in a hospital or clinic by a specially trained health care professional. Talk to your pediatrician regarding the use of this medicine in children. Special care may be needed. Overdosage: If you think you have taken too much of this medicine contact a poison control center or emergency room at once. NOTE: This medicine is only for you. Do not share this medicine with others.  What if I miss a dose? It is important not to miss your dose. Call your doctor or health care professional if you are unable to keep an appointment. What may interact with this medicine? This medicine may interact with the following medications: -Ibuprofen This list may not describe all  possible interactions. Give your health care provider a list of all the medicines, herbs, non-prescription drugs, or dietary supplements you use. Also tell them if you smoke, drink alcohol, or use illegal drugs. Some items may interact with your medicine. What should I watch for while using this medicine? Visit your doctor for checks on your progress. This drug may make you feel generally unwell. This is not uncommon, as chemotherapy can affect healthy cells as well as cancer cells. Report any side effects. Continue your course of treatment even though you feel ill unless your doctor tells you to stop. In some cases, you may be given additional medicines to help with side effects. Follow all directions for their use. Call your doctor or health care professional for advice if you get a fever, chills or sore throat, or other symptoms of a cold or flu. Do not treat yourself. This drug decreases your body's ability to fight infections. Try to avoid being around people who are sick. This medicine may increase your risk to bruise or bleed. Call your doctor or health care professional if you notice any unusual bleeding. Be careful brushing and flossing your teeth or using a toothpick because you may get an infection or bleed more easily. If you have any dental work done, tell your dentist you are receiving this medicine. Avoid taking products that contain aspirin, acetaminophen, ibuprofen, naproxen, or ketoprofen unless instructed by your doctor. These medicines may hide a fever. Call your doctor or health care professional if you get diarrhea or mouth sores. Do not treat yourself. To protect your kidneys, drink water or other fluids as directed while you are taking this medicine. Do not become pregnant while taking this medicine or for 6 months after stopping it. Women should inform their doctor if they wish to become pregnant or think they might be pregnant. Men should not father a child while taking this  medicine and for 3 months after stopping it. This may interfere with the ability to father a child. You should talk to your doctor or health care professional if you are concerned about your fertility. There is a potential for serious side effects to an unborn child. Talk to your health care professional or pharmacist for more information. Do not breast-feed an infant while taking this medicine or for 1 week after stopping it. What side effects may I notice from receiving this medicine? Side effects that you should report to your doctor or health care professional as soon as possible: -allergic reactions like skin rash, itching or hives, swelling of the face, lips, or tongue -breathing problems -redness, blistering, peeling or loosening of the skin, including inside the mouth -signs and symptoms of bleeding such as bloody or black, tarry stools; red or dark-brown urine; spitting up blood or brown material that looks like coffee grounds; red spots on the skin; unusual bruising or bleeding from the eye, gums, or nose -signs and symptoms of infection like fever or chills; cough; sore throat; pain or trouble passing urine -signs and symptoms of kidney injury like trouble passing urine or change in the amount of urine -signs and symptoms of liver injury like dark yellow or brown urine; general ill feeling or  flu-like symptoms; light-colored stools; loss of appetite; nausea; right upper belly pain; unusually weak or tired; yellowing of the eyes or skin Side effects that usually do not require medical attention (report to your doctor or health care professional if they continue or are bothersome): -constipation -mouth sores -nausea, vomiting -unusually weak or tired This list may not describe all possible side effects. Call your doctor for medical advice about side effects. You may report side effects to FDA at 1-800-FDA-1088. Where should I keep my medicine? This drug is given in a hospital or clinic and  will not be stored at home. NOTE: This sheet is a summary. It may not cover all possible information. If you have questions about this medicine, talk to your doctor, pharmacist, or health care provider.  2019 Elsevier/Gold Standard (2017-04-19 16:11:33)  Carboplatin injection What is this medicine? CARBOPLATIN (KAR boe pla tin) is a chemotherapy drug. It targets fast dividing cells, like cancer cells, and causes these cells to die. This medicine is used to treat ovarian cancer and many other cancers. This medicine may be used for other purposes; ask your health care provider or pharmacist if you have questions. COMMON BRAND NAME(S): Paraplatin What should I tell my health care provider before I take this medicine? They need to know if you have any of these conditions: -blood disorders -hearing problems -kidney disease -recent or ongoing radiation therapy -an unusual or allergic reaction to carboplatin, cisplatin, other chemotherapy, other medicines, foods, dyes, or preservatives -pregnant or trying to get pregnant -breast-feeding How should I use this medicine? This drug is usually given as an infusion into a vein. It is administered in a hospital or clinic by a specially trained health care professional. Talk to your pediatrician regarding the use of this medicine in children. Special care may be needed. Overdosage: If you think you have taken too much of this medicine contact a poison control center or emergency room at once. NOTE: This medicine is only for you. Do not share this medicine with others. What if I miss a dose? It is important not to miss a dose. Call your doctor or health care professional if you are unable to keep an appointment. What may interact with this medicine? -medicines for seizures -medicines to increase blood counts like filgrastim, pegfilgrastim, sargramostim -some antibiotics like amikacin, gentamicin, neomycin, streptomycin, tobramycin -vaccines Talk to  your doctor or health care professional before taking any of these medicines: -acetaminophen -aspirin -ibuprofen -ketoprofen -naproxen This list may not describe all possible interactions. Give your health care provider a list of all the medicines, herbs, non-prescription drugs, or dietary supplements you use. Also tell them if you smoke, drink alcohol, or use illegal drugs. Some items may interact with your medicine. What should I watch for while using this medicine? Your condition will be monitored carefully while you are receiving this medicine. You will need important blood work done while you are taking this medicine. This drug may make you feel generally unwell. This is not uncommon, as chemotherapy can affect healthy cells as well as cancer cells. Report any side effects. Continue your course of treatment even though you feel ill unless your doctor tells you to stop. In some cases, you may be given additional medicines to help with side effects. Follow all directions for their use. Call your doctor or health care professional for advice if you get a fever, chills or sore throat, or other symptoms of a cold or flu. Do not treat yourself. This drug decreases  your body's ability to fight infections. Try to avoid being around people who are sick. This medicine may increase your risk to bruise or bleed. Call your doctor or health care professional if you notice any unusual bleeding. Be careful brushing and flossing your teeth or using a toothpick because you may get an infection or bleed more easily. If you have any dental work done, tell your dentist you are receiving this medicine. Avoid taking products that contain aspirin, acetaminophen, ibuprofen, naproxen, or ketoprofen unless instructed by your doctor. These medicines may hide a fever. Do not become pregnant while taking this medicine. Women should inform their doctor if they wish to become pregnant or think they might be pregnant. There is a  potential for serious side effects to an unborn child. Talk to your health care professional or pharmacist for more information. Do not breast-feed an infant while taking this medicine. What side effects may I notice from receiving this medicine? Side effects that you should report to your doctor or health care professional as soon as possible: -allergic reactions like skin rash, itching or hives, swelling of the face, lips, or tongue -signs of infection - fever or chills, cough, sore throat, pain or difficulty passing urine -signs of decreased platelets or bleeding - bruising, pinpoint red spots on the skin, black, tarry stools, nosebleeds -signs of decreased red blood cells - unusually weak or tired, fainting spells, lightheadedness -breathing problems -changes in hearing -changes in vision -chest pain -high blood pressure -low blood counts - This drug may decrease the number of white blood cells, red blood cells and platelets. You may be at increased risk for infections and bleeding. -nausea and vomiting -pain, swelling, redness or irritation at the injection site -pain, tingling, numbness in the hands or feet -problems with balance, talking, walking -trouble passing urine or change in the amount of urine Side effects that usually do not require medical attention (report to your doctor or health care professional if they continue or are bothersome): -hair loss -loss of appetite -metallic taste in the mouth or changes in taste This list may not describe all possible side effects. Call your doctor for medical advice about side effects. You may report side effects to FDA at 1-800-FDA-1088. Where should I keep my medicine? This drug is given in a hospital or clinic and will not be stored at home. NOTE: This sheet is a summary. It may not cover all possible information. If you have questions about this medicine, talk to your doctor, pharmacist, or health care provider.  2019 Elsevier/Gold  Standard (2007-06-05 14:38:05)

## 2018-07-04 NOTE — Telephone Encounter (Signed)
ILVM to have Lattie Haw check on his compazine rx and call me back.

## 2018-07-04 NOTE — Telephone Encounter (Addendum)
[facility]wen case manager will return my call to see if pt enrolled in pleurix pt assistance program. PT not enrolled in pt assistance through [facility].  I called [facility]. Per orders Lovena Le the [place] has to submit request for pleurix supplies to DME provider  [place] ( [place] [phone] ext [phone]).

## 2018-07-05 ENCOUNTER — Encounter: Payer: Self-pay | Admitting: *Deleted

## 2018-07-05 NOTE — Progress Notes (Signed)
Oncology Nurse Navigator Documentation  Oncology Nurse Navigator Flowsheets 07/05/2018  Navigator Location [facility]  Referral date to RadOnc/MedOnc -  Navigator Encounter Type Other/I followed up on Daniel Bryant and his appts.  He made his appt yesterday, his first infusion.  I called [place] to update and updated her on his upcoming appts.  I then called patient's neighbor to make sure she is able to bring him to his appt and the transportation service will bring him home.  I was unable to reach her but left a vm message with my name to call.   Telephone -  Abnormal Finding Date -  Confirmed Diagnosis Date -  [facility] Date -  Treatment Initiated Date -  Patient Visit Type -  Treatment Phase Treatment  Barriers/Navigation Needs Coordination of Care  Education -  Interventions Coordination of Care  Coordination of Care Other  Education Method -  Acuity Level 3  Acuity Level 2 -  Time Spent with Patient 45

## 2018-07-05 NOTE — Progress Notes (Signed)
These preliminary result these preliminary results were noted.  Awaiting final report.

## 2018-07-07 LAB — CULTURE, GROUP A STREP (THRC)

## 2018-07-08 NOTE — Progress Notes (Signed)
[facility] Progress Note   Daniel Bryant [id number] 1960/10/05 58 y.o.  Daniel Bryant is managed by Dr. Fanny Bien. Daniel Bryant  Actively treated with chemotherapy/immunotherapy/hormonal therapy: yes  Current therapy: Carboplatin, Keytruda, and Alimta  Last treated: 07/04/2018 (cycle 1, day 1)  Next scheduled appointment with provider: 07/10/2018  Assessment: Plan:    Lesion of tonsil - Plan: Culture, group A strep  Essential hypertension  Adenocarcinoma of right lung ([place])   Lesion of the left tonsil: An aerobic culture was collected today.  The patient was also given a prescription for Magic mouthwash and viscous lidocaine.  He was given a GI cocktail to swish today while he is being treated.  Essential hypertension: Patient was seen while he was receiving chemotherapy today.  He was noted to have a blood pressure of 157/111.  He was given clonidine 0.2 mg p.o. x1.    Stage IV non-small cell lung cancer, adenocarcinoma of the right lung: The patient is receiving cycle 1, day 1 of carboplatin, Alimta, and Keytruda.  He will follow-up with Dr. Julien Nordmann on 07/10/2018.  Please see After Visit Summary for patient specific instructions.  Future Appointments  Date Time Provider [place]  07/10/2018  9:00 AM [facility] LAB 5 [facility] None  07/10/2018  9:30 AM Curt Bears, MD [facility] None  07/10/2018  1:00 PM [facility] PET CT 1 [facility] [place]  07/11/2018 10:30 AM Ivin Poot, MD [facility]  07/18/2018  8:00 AM [facility] LAB 2 [facility] None  07/18/2018  8:30 AM Curt Bears, MD [facility] None  07/18/2018  9:30 AM [facility] INFUSION [facility] None  07/25/2018  3:00 PM [facility] LAB 1 [facility] None    Orders Placed This Encounter  Procedures  . Culture, group A strep       Subjective:   Patient ID:  Daniel Bryant is a 58 y.o. (DOB Dec 22, 1960) male.  Chief Complaint: No chief complaint on file.   HPI  Daniel Bryant is a 59 year old male with a history of a stage IV non-small cell lung cancer, adenocarcinoma of the right lung who is managed by Dr. Julien Nordmann and is receiving cycle 1, day 1 of carboplatin, Alimta, and Keytruda today.  He was seen in the infusion room when he was noted to have a blood pressure of 157/111.  He also reported that he was having throat pain.  He states that he has had a recurrent infection of his tonsils in the past and believes that this is the case today.  He has a large ulcerated lesion over his left tonsil.  Medications: I have reviewed the patient's current medications.  Allergies: No Known Allergies  Past Medical History:  Diagnosis Date  . Asthma   . Cancer ([place])   . COPD (chronic obstructive pulmonary disease) ([place])   . GERD (gastroesophageal reflux disease)   . High cholesterol   . History of petit-mal seizures   . Hyperlipidemia   . Hypertension   . Memory impairment   . OSA (obstructive sleep apnea)    Does not tolerate CPAP  . Pacemaker   . PTSD (post-traumatic stress disorder)   . Sick sinus syndrome [facility])     Past Surgical History:  Procedure Laterality Date  . CARDIAC SURGERY    . CHEST TUBE INSERTION Right 05/16/2018   Procedure: INSERTION PLEURAL DRAINAGE CATHETER;  Surgeon: Ivin Poot, MD;  Location: [place];  Service: Thoracic;  Laterality: Right;  . CHEST TUBE INSERTION Right 05/16/2018  Procedure: Chest Tube Insertion;  Surgeon: Ivin Poot, MD;  Location: [place];  Service: Thoracic;  Laterality: Right;  . PACEMAKER INSERTION      Family History  Problem Relation Age of Onset  . Alzheimer's disease Mother   . Cancer Mother   . Memory loss Father     Social History   Socioeconomic History  . Marital status: Widowed    Spouse name: Not on file  . Number of children: Not on file  . Years of education: Not on file  . Highest education level: Not on file  Occupational History  . Not on file  Social Needs  .  Financial resource strain: Not on file  . Food insecurity:    Worry: Not on file    Inability: Not on file  . Transportation needs:    Medical: Not on file    Non-medical: Not on file  Tobacco Use  . Smoking status: Former Smoker    Types: E-cigarettes  . Smokeless tobacco: Current User    Types: Chew  Substance and Sexual Activity  . Alcohol use: Yes    Frequency: Never  . Drug use: No  . Sexual activity: Not on file  Lifestyle  . Physical activity:    Days per week: Not on file    Minutes per session: Not on file  . Stress: Not on file  Relationships  . Social connections:    Talks on phone: Not on file    Gets together: Not on file    Attends religious service: Not on file    Active member of club or organization: Not on file    Attends meetings of clubs or organizations: Not on file    Relationship status: Not on file  . Intimate partner violence:    Fear of current or ex partner: Not on file    Emotionally abused: Not on file    Physically abused: Not on file    Forced sexual activity: Not on file  Other Topics Concern  . Not on file  Social History Narrative  . Not on file    Past Medical History, Surgical history, Social history, and Family history were reviewed and updated as appropriate.   Please see review of systems for further details on the patient's review from today.   Review of Systems:  Review of Systems  Constitutional: Negative for chills and diaphoresis.  HENT: Positive for sore throat and trouble swallowing. Negative for congestion, postnasal drip, sinus pressure and sinus pain.   Respiratory: Negative for cough, shortness of breath and wheezing.   Cardiovascular: Negative for chest pain and palpitations.  Neurological: Negative for headaches.    Objective:   Physical Exam:  There were no vitals taken for this visit. ECOG: 0  Physical Exam Constitutional:      General: He is not in acute distress.    Appearance: He is not  diaphoretic.  HENT:     Head: Normocephalic and atraumatic.     Mouth/Throat:     Comments: There is a large necrotic appearing indurated and tender lesion over the left anterior tonsil.  No exudate is noted. Neck:     Musculoskeletal: Normal range of motion and neck supple.  Cardiovascular:     Rate and Rhythm: Normal rate and regular rhythm.     Heart sounds: Normal heart sounds. No murmur. No friction rub. No gallop.   Pulmonary:     Effort: Pulmonary effort is normal. No respiratory distress.  Breath sounds: Normal breath sounds. No stridor. No wheezing or rales.  Lymphadenopathy:     Cervical: No cervical adenopathy.  Skin:    General: Skin is warm and dry.     Findings: No erythema or rash.  Neurological:     Mental Status: He is alert.     Lab Review:       Component Value Date/Time   NA 139 07/04/2018 1006   K 3.3 (L) 07/04/2018 1006   CL 105 07/04/2018 1006   CO2 23 07/04/2018 1006   GLUCOSE 104 (H) 07/04/2018 1006   BUN 9 07/04/2018 1006   CREATININE 0.86 07/04/2018 1006   CALCIUM 9.1 07/04/2018 1006   PROT 7.3 07/04/2018 1006   ALBUMIN 3.8 07/04/2018 1006   AST 16 07/04/2018 1006   ALT 18 07/04/2018 1006   ALKPHOS 111 07/04/2018 1006   BILITOT 0.7 07/04/2018 1006   GFRNONAA >60 07/04/2018 1006   GFRAA >60 07/04/2018 1006       Component Value Date/Time   WBC 13.0 (H) 07/04/2018 1006   WBC 11.1 (H) 05/15/2018 1913   RBC 5.99 (H) 07/04/2018 1006   HGB 17.4 (H) 07/04/2018 1006   HCT 53.1 (H) 07/04/2018 1006   PLT 223 07/04/2018 1006   MCV 88.6 07/04/2018 1006   MCH 29.0 07/04/2018 1006   MCHC 32.8 07/04/2018 1006   RDW 13.6 07/04/2018 1006   LYMPHSABS 2.1 07/04/2018 1006   MONOABS 0.8 07/04/2018 1006   EOSABS 2.2 (H) 07/04/2018 1006   BASOSABS 0.2 (H) 07/04/2018 1006   -------------------------------  Imaging from last 24 hours (if applicable):  Radiology interpretation: Dg Chest Portable 1 View  Result Date: 06/12/2018 CLINICAL DATA:   History of right-sided PleurX catheter. EXAM: PORTABLE CHEST 1 VIEW COMPARISON:  06/02/2018; 05/20/2018; 05/19/2018 FINDINGS: Grossly unchanged cardiac silhouette and mediastinal contours. Stable position of support apparatus with interval reduction in persistent trace right-sided effusion. No pneumothorax. No definite left-sided pleural effusion. Improved aeration right lung base with persistent right mid and lower lung heterogeneous opacities. No new focal airspace opacities. No definite evidence of edema.  No acute osseous abnormalities. IMPRESSION: Stable position of support apparatus with interval reduction in persistent trace right-sided pleural effusion. Electronically Signed   By: Sandi Mariscal M.D.   On: 06/12/2018 12:56   Dg Chest Portable 1 View  Result Date: 06/12/2018 CLINICAL DATA:  Worsening shortness of breath and cough. EXAM: PORTABLE CHEST 1 VIEW COMPARISON:  Chest x-ray dated May 20, 2018. FINDINGS: Unchanged right chest wall pacemaker and right-sided PleurX catheter. Stable cardiomediastinal silhouette. Increasing small right pleural effusion with adjacent right lower lobe atelectasis. The left lung is clear. No pneumothorax. No acute osseous abnormality. IMPRESSION: 1. Increasing small right pleural effusion with adjacent right lower lobe atelectasis. Unchanged right-sided PleurX catheter. Electronically Signed   By: Titus Dubin M.D.   On: 06/12/2018 10:59

## 2018-07-09 ENCOUNTER — Telehealth: Payer: Self-pay | Admitting: *Deleted

## 2018-07-09 NOTE — Telephone Encounter (Signed)
[place] Work  Clinical Social Work contacted patient by phone to follow up and remind him of tomorrow's appointment.  Patient reported he's recently bought a car, but is unsure how to get to cancer center.  Patient agreed to use Lyft program.  CSW scheduled to deliver patient "Box of Love" meal on Wednesday.   Gwinda Maine, LCSW  Clinical Social Worker [facility]

## 2018-07-10 ENCOUNTER — Ambulatory Visit (HOSPITAL_COMMUNITY)
Admission: RE | Admit: 2018-07-10 | Discharge: 2018-07-10 | Disposition: A | Discharge status: Home / Self Care | Payer: Medicaid Other | Source: Ambulatory Visit | Attending: Internal Medicine | Admitting: Internal Medicine

## 2018-07-10 ENCOUNTER — Other Ambulatory Visit: Payer: Self-pay

## 2018-07-10 ENCOUNTER — Telehealth: Payer: Self-pay | Admitting: *Deleted

## 2018-07-10 ENCOUNTER — Telehealth: Payer: Self-pay | Admitting: Medical Oncology

## 2018-07-10 ENCOUNTER — Inpatient Hospital Stay: Payer: No Typology Code available for payment source

## 2018-07-10 ENCOUNTER — Inpatient Hospital Stay (HOSPITAL_BASED_OUTPATIENT_CLINIC_OR_DEPARTMENT_OTHER): Payer: No Typology Code available for payment source | Admitting: Internal Medicine

## 2018-07-10 ENCOUNTER — Encounter: Payer: Self-pay | Admitting: Internal Medicine

## 2018-07-10 VITALS — BP 123/87 | HR 101 | Temp 98.1°F | Resp 20 | Ht 73.0 in | Wt 233.0 lb

## 2018-07-10 DIAGNOSIS — Z5112 Encounter for antineoplastic immunotherapy: Secondary | ICD-10-CM | POA: Diagnosis not present

## 2018-07-10 DIAGNOSIS — C3491 Malignant neoplasm of unspecified part of right bronchus or lung: Secondary | ICD-10-CM

## 2018-07-10 DIAGNOSIS — C3490 Malignant neoplasm of unspecified part of unspecified bronchus or lung: Secondary | ICD-10-CM | POA: Insufficient documentation

## 2018-07-10 DIAGNOSIS — R110 Nausea: Secondary | ICD-10-CM | POA: Diagnosis not present

## 2018-07-10 DIAGNOSIS — Z5111 Encounter for antineoplastic chemotherapy: Secondary | ICD-10-CM

## 2018-07-10 DIAGNOSIS — J910 Malignant pleural effusion: Secondary | ICD-10-CM

## 2018-07-10 DIAGNOSIS — R197 Diarrhea, unspecified: Secondary | ICD-10-CM

## 2018-07-10 DIAGNOSIS — C3411 Malignant neoplasm of upper lobe, right bronchus or lung: Secondary | ICD-10-CM | POA: Diagnosis not present

## 2018-07-10 DIAGNOSIS — K1370 Unspecified lesions of oral mucosa: Secondary | ICD-10-CM | POA: Diagnosis not present

## 2018-07-10 LAB — CBC WITH DIFFERENTIAL (CANCER CENTER ONLY)
Abs Immature Granulocytes: 0.01 10*3/uL (ref 0.00–0.07)
Basophils Absolute: 0 10*3/uL (ref 0.0–0.1)
Basophils Relative: 1 %
Eosinophils Absolute: 1 10*3/uL — ABNORMAL HIGH (ref 0.0–0.5)
Eosinophils Relative: 17 %
HCT: 50.6 % (ref 39.0–52.0)
Hemoglobin: 16.2 g/dL (ref 13.0–17.0)
Immature Granulocytes: 0 %
Lymphocytes Relative: 16 %
Lymphs Abs: 1 10*3/uL (ref 0.7–4.0)
MCH: 28.3 pg (ref 26.0–34.0)
MCHC: 32 g/dL (ref 30.0–36.0)
MCV: 88.3 fL (ref 80.0–100.0)
Monocytes Absolute: 0.1 10*3/uL (ref 0.1–1.0)
Monocytes Relative: 2 %
Neutro Abs: 4 10*3/uL (ref 1.7–7.7)
Neutrophils Relative %: 64 %
Platelet Count: 170 10*3/uL (ref 150–400)
RBC: 5.73 MIL/uL (ref 4.22–5.81)
RDW: 13.1 % (ref 11.5–15.5)
WBC Count: 6.1 10*3/uL (ref 4.0–10.5)
nRBC: 0 % (ref 0.0–0.2)

## 2018-07-10 LAB — CMP (CANCER CENTER ONLY)
ALT: 16 U/L (ref 0–44)
AST: 18 U/L (ref 15–41)
Albumin: 3.6 g/dL (ref 3.5–5.0)
Alkaline Phosphatase: 97 U/L (ref 38–126)
Anion gap: 14 (ref 5–15)
BUN: 20 mg/dL (ref 6–20)
CO2: 30 mmol/L (ref 22–32)
Calcium: 8.9 mg/dL (ref 8.9–10.3)
Chloride: 95 mmol/L — ABNORMAL LOW (ref 98–111)
Creatinine: 1.04 mg/dL (ref 0.61–1.24)
GFR, Est AFR Am: >60 mL/min (ref >60)
GFR, Estimated: >60 mL/min (ref >60)
Glucose, Bld: 105 mg/dL — ABNORMAL HIGH (ref 70–99)
Potassium: 3.5 mmol/L (ref 3.5–5.1)
Sodium: 139 mmol/L (ref 135–145)
Total Bilirubin: 1.1 mg/dL (ref 0.3–1.2)
Total Protein: 7.6 g/dL (ref 6.5–8.1)

## 2018-07-10 LAB — GLUCOSE, CAPILLARY: Glucose-Capillary: 102 mg/dL — ABNORMAL HIGH (ref 70–99)

## 2018-07-10 MED ORDER — FLUDEOXYGLUCOSE F - 18 (FDG) INJECTION
10.6300 | Freq: Once | INTRAVENOUS | Status: AC
  Administered 2018-07-10: 10.63 via INTRAVENOUS

## 2018-07-10 NOTE — Progress Notes (Signed)
[place] Telephone:([phone]) [phone]   [phone]  OFFICE PROGRESS NOTE  Default, Provider, MD No address on file  DIAGNOSIS: Stage IV (T1b, N2, M1) non-small cell lung cancer, adenocarcinoma diagnosed in January 2020.  He presented with right upper lobe lung nodule in addition to mediastinal lymphadenopathy and malignant right pleural effusion.  PRIOR THERAPY: None  CURRENT THERAPY: Systemic chemotherapy with carboplatin for AUC of 5, Alimta 500 mg/M2 and Keytruda 200 mg IV every 3 weeks status post 1 cycle.  First dose was given on July 04, 2018.  INTERVAL HISTORY: Daniel Bryant 58 y.o. male returns to the clinic today for follow-up visit.  The patient is feeling fine today with no concerning complaints except for one episode of nausea last night and 2 episodes of diarrhea the first 1 was last night and 1 earlier today.  He denied having any current fever or chills.  He has no headache or visual changes.  He tolerated the first week of his treatment well except for the few episodes of diarrhea and nausea.  He has no significant weight loss.  He is scheduled to have a PET scan later today.  The patient also continues to have ulcers in his mouth with broken teeth.  He is followed by the [facility] and still waiting for an appointment with his dentist.  The patient is here today for evaluation and repeat blood work.  MEDICAL HISTORY: Past Medical History:  Diagnosis Date  . Asthma   . Cancer ([place])   . COPD (chronic obstructive pulmonary disease) ([place])   . GERD (gastroesophageal reflux disease)   . High cholesterol   . History of petit-mal seizures   . Hyperlipidemia   . Hypertension   . Memory impairment   . OSA (obstructive sleep apnea)    Does not tolerate CPAP  . Pacemaker   . PTSD (post-traumatic stress disorder)   . Sick sinus syndrome ([facility])     ALLERGIES:  has No Known Allergies.  MEDICATIONS:  Current Outpatient Medications  Medication  Sig Dispense Refill  . albuterol (PROVENTIL) (2.5 MG/3ML) 0.083% nebulizer solution Take 3 mLs (2.5 mg total) by nebulization 2 (two) times daily as needed for wheezing or shortness of breath. 75 mL 0  . amoxicillin-clavulanate (AUGMENTIN) 875-125 MG tablet Take 1 tablet by mouth 2 (two) times daily. 14 tablet 0  . aspirin EC 81 MG EC tablet Take 1 tablet (81 mg total) by mouth daily.    Marland Kitchen atorvastatin (LIPITOR) 40 MG tablet Take 40 mg by mouth at bedtime.    . Cholecalciferol (VITAMIN D3) 25 MCG (1000 UT) CAPS Take 1,000 Units by mouth daily.    Marland Kitchen donepezil (ARICEPT) 5 MG tablet Take 5 mg by mouth at bedtime.    Marland Kitchen escitalopram (LEXAPRO) 20 MG tablet Take 20 mg by mouth daily.     . folic acid (FOLVITE) 1 MG tablet Take 1 tablet (1 mg total) by mouth daily. 30 tablet 4  . lidocaine (XYLOCAINE) 2 % solution Use as directed 5 mLs in the mouth or throat every 3 (three) hours as needed for mouth pain. Gargle and spit 200 mL 1  . lisinopril (PRINIVIL,ZESTRIL) 5 MG tablet Take 5 mg by mouth daily.     . pantoprazole (PROTONIX) 40 MG tablet Take 1 tablet (40 mg total) by mouth daily. 30 tablet 0  . prochlorperazine (COMPAZINE) 10 MG tablet Take 1 tablet (10 mg total) by mouth every 6 (six)  hours as needed for nausea or vomiting. 30 tablet 0  . vitamin B-12 (CYANOCOBALAMIN) 500 MCG tablet Take 500 mcg by mouth daily.     No current facility-administered medications for this visit.     SURGICAL HISTORY:  Past Surgical History:  Procedure Laterality Date  . CARDIAC SURGERY    . CHEST TUBE INSERTION Right 05/16/2018   Procedure: INSERTION PLEURAL DRAINAGE CATHETER;  Surgeon: Ivin Poot, MD;  Location: [place];  Service: Thoracic;  Laterality: Right;  . CHEST TUBE INSERTION Right 05/16/2018   Procedure: Chest Tube Insertion;  Surgeon: Ivin Poot, MD;  Location: [place];  Service: Thoracic;  Laterality: Right;  . PACEMAKER INSERTION      REVIEW OF SYSTEMS:  A comprehensive review of systems was  negative except for: Ears, nose, mouth, throat, and face: positive for sore mouth Gastrointestinal: positive for diarrhea and nausea   PHYSICAL EXAMINATION: General appearance: alert, cooperative and no distress Head: Normocephalic, without obvious abnormality, atraumatic Neck: no adenopathy, no JVD, supple, symmetrical, trachea midline and thyroid not enlarged, symmetric, no tenderness/mass/nodules Lymph nodes: Cervical, supraclavicular, and axillary nodes normal. Resp: clear to auscultation bilaterally Back: symmetric, no curvature. ROM normal. No CVA tenderness. Cardio: regular rate and rhythm, S1, S2 normal, no murmur, click, rub or gallop GI: soft, non-tender; bowel sounds normal; no masses,  no organomegaly Extremities: extremities normal, atraumatic, no cyanosis or edema  ECOG PERFORMANCE STATUS: 1 - Symptomatic but completely ambulatory  Blood pressure 123/87, pulse (!) 101, temperature 98.1 F (36.7 C), temperature source Oral, resp. rate 20, height 6\' 1"  (1.854 m), weight 233 lb (105.7 kg), SpO2 97 %.  LABORATORY DATA: Lab Results  Component Value Date   WBC 13.0 (H) 07/04/2018   HGB 17.4 (H) 07/04/2018   HCT 53.1 (H) 07/04/2018   MCV 88.6 07/04/2018   PLT 223 07/04/2018      Chemistry      Component Value Date/Time   NA 139 07/04/2018 1006   K 3.3 (L) 07/04/2018 1006   CL 105 07/04/2018 1006   CO2 23 07/04/2018 1006   BUN 9 07/04/2018 1006   CREATININE 0.86 07/04/2018 1006      Component Value Date/Time   CALCIUM 9.1 07/04/2018 1006   ALKPHOS 111 07/04/2018 1006   AST 16 07/04/2018 1006   ALT 18 07/04/2018 1006   BILITOT 0.7 07/04/2018 1006       RADIOGRAPHIC STUDIES: Dg Chest Portable 1 View  Result Date: 06/12/2018 CLINICAL DATA:  History of right-sided PleurX catheter. EXAM: PORTABLE CHEST 1 VIEW COMPARISON:  06/02/2018; 05/20/2018; 05/19/2018 FINDINGS: Grossly unchanged cardiac silhouette and mediastinal contours. Stable position of support apparatus  with interval reduction in persistent trace right-sided effusion. No pneumothorax. No definite left-sided pleural effusion. Improved aeration right lung base with persistent right mid and lower lung heterogeneous opacities. No new focal airspace opacities. No definite evidence of edema.  No acute osseous abnormalities. IMPRESSION: Stable position of support apparatus with interval reduction in persistent trace right-sided pleural effusion. Electronically Signed   By: Sandi Mariscal M.D.   On: 06/12/2018 12:56   Dg Chest Portable 1 View  Result Date: 06/12/2018 CLINICAL DATA:  Worsening shortness of breath and cough. EXAM: PORTABLE CHEST 1 VIEW COMPARISON:  Chest x-ray dated May 20, 2018. FINDINGS: Unchanged right chest wall pacemaker and right-sided PleurX catheter. Stable cardiomediastinal silhouette. Increasing small right pleural effusion with adjacent right lower lobe atelectasis. The left lung is clear. No pneumothorax. No acute osseous abnormality. IMPRESSION: 1.  Increasing small right pleural effusion with adjacent right lower lobe atelectasis. Unchanged right-sided PleurX catheter. Electronically Signed   By: Titus Dubin M.D.   On: 06/12/2018 10:59    ASSESSMENT AND PLAN: This is a very pleasant 58 years old white male with metastatic non-small cell lung cancer, adenocarcinoma currently undergoing systemic chemotherapy with carboplatin, Alimta and Keytruda status post 1 cycle. The patient tolerated the first cycle of his treatment well with no concerning adverse effect except for one episode of nausea and 2 episodes of diarrhea started last night. I recommended for the patient to increase his oral hydration and also to use Imodium for now for the diarrhea.  He was advised that if the diarrhea started getting worse and more than 5-6 times daily to call back as the patient may need treatment with high-dose steroids. For the oral ulcers and broken teeth, he was advised to contact his dentist at the  [facility] facility for sooner evaluation. I will see him back for follow-up visit.  2 weeks for evaluation before starting cycle #2. The patient was advised to call immediately if he has any concerning symptoms in the interval. The patient voices understanding of current disease status and treatment options and is in agreement with the current care plan.  All questions were answered. The patient knows to call the clinic with any problems, questions or concerns. We can certainly see the patient much sooner if necessary.  I spent 10 minutes counseling the patient face to face. The total time spent in the appointment was 15 minutes.  Disclaimer: This note was dictated with voice recognition software. Similar sounding words can inadvertently be transcribed and may not be corrected upon review.

## 2018-07-10 NOTE — Telephone Encounter (Signed)
No call made to patient today.  [name] here today for provider F/U.  Noted 07-05-2018 Navigator call note after 07-04-2018 treatment requesting return call from patient.

## 2018-07-10 NOTE — Telephone Encounter (Signed)
-----   Message from Royston Bake, RN sent at 07/04/2018 12:30 PM EDT ----- Regarding: Dr. Julien Nordmann first treatment Dr. Julien Nordmann first treatment.

## 2018-07-10 NOTE — Telephone Encounter (Signed)
Mouth ulcers - I called Lattie Haw , case Freight forwarder at [facility] , and told her pt needs to see PCP asap for mouth ulcers. She said she will get an appt set up.

## 2018-07-11 ENCOUNTER — Ambulatory Visit (INDEPENDENT_AMBULATORY_CARE_PROVIDER_SITE_OTHER): Payer: Medicaid Other | Admitting: Cardiothoracic Surgery

## 2018-07-11 ENCOUNTER — Other Ambulatory Visit: Payer: Medicaid Other

## 2018-07-11 ENCOUNTER — Encounter: Payer: Self-pay | Admitting: Cardiothoracic Surgery

## 2018-07-11 ENCOUNTER — Ambulatory Visit: Payer: Medicaid Other | Admitting: Internal Medicine

## 2018-07-11 VITALS — BP 144/88 | HR 110 | Temp 96.5°F | Resp 20 | Ht 73.0 in | Wt 233.0 lb

## 2018-07-11 DIAGNOSIS — J90 Pleural effusion, not elsewhere classified: Secondary | ICD-10-CM

## 2018-07-11 NOTE — Progress Notes (Signed)
PCP is Default, Provider, MD Referring Provider is Dorie Rank, MD  Chief Complaint  Patient presents with  . Pleural Effusion    f/u with CXR    HPI: Patient examined, images of recent PET scan personally reviewed and discussed with patient.  58 [facility] with recently diagnosed advanced stage adenocarcinoma of the right lung.  He had a Pleurx catheter placed approximately a month ago for recurrent malignant effusion.  He is now under the care of Dr. Earlie Server and has received his first cycle of chemotherapy.  CT-PET scan images performed yesterday showed minimal right pleural effusion with catheter in good position.  The catheter drainage has been on a every 3-day schedule in the last 2 drainage volumes have been reduced at 125 cc and 75 cc.  PET scan shows hypermetabolic pleural nodules in the right chest as well as a right lower lobe parenchymal hypermetabolic density and hypermetabolic right paratracheal nodes.  There is been no drainage from the exit site of the Pleurx catheter in the silk suture was removed today in the office and a clean dressing applied.  It looks fine.  The patient is scheduled to receive another cycle of chemotherapy in the next 2 to 3 weeks.  The patient understands that the Pleurx drainage may reduce in volume because of the chemotherapy.  He will now change his drainage schedule to twice a week on Monday-Thursday.  Past Medical History:  Diagnosis Date  . Asthma   . Cancer ([place])   . COPD (chronic obstructive pulmonary disease) ([place])   . GERD (gastroesophageal reflux disease)   . High cholesterol   . History of petit-mal seizures   . Hyperlipidemia   . Hypertension   . Memory impairment   . OSA (obstructive sleep apnea)    Does not tolerate CPAP  . Pacemaker   . PTSD (post-traumatic stress disorder)   . Sick sinus syndrome [facility])     Past Surgical History:  Procedure Laterality Date  . CARDIAC SURGERY    . CHEST TUBE INSERTION Right 05/16/2018    Procedure: INSERTION PLEURAL DRAINAGE CATHETER;  Surgeon: Ivin Poot, MD;  Location: [place];  Service: Thoracic;  Laterality: Right;  . CHEST TUBE INSERTION Right 05/16/2018   Procedure: Chest Tube Insertion;  Surgeon: Ivin Poot, MD;  Location: [place];  Service: Thoracic;  Laterality: Right;  . PACEMAKER INSERTION      Family History  Problem Relation Age of Onset  . Alzheimer's disease Mother   . Cancer Mother   . Memory loss Father     Social History Social History   Tobacco Use  . Smoking status: Former Smoker    Types: E-cigarettes  . Smokeless tobacco: Current User    Types: Chew  Substance Use Topics  . Alcohol use: Yes    Frequency: Never  . Drug use: No    Current Outpatient Medications  Medication Sig Dispense Refill  . albuterol (PROVENTIL) (2.5 MG/3ML) 0.083% nebulizer solution Take 3 mLs (2.5 mg total) by nebulization 2 (two) times daily as needed for wheezing or shortness of breath. 75 mL 0  . amoxicillin-clavulanate (AUGMENTIN) 875-125 MG tablet Take 1 tablet by mouth 2 (two) times daily. 14 tablet 0  . aspirin EC 81 MG EC tablet Take 1 tablet (81 mg total) by mouth daily.    Marland Kitchen atorvastatin (LIPITOR) 40 MG tablet Take 40 mg by mouth at bedtime.    . Cholecalciferol (VITAMIN D3) 25 MCG (1000 UT) CAPS Take 1,000  Units by mouth daily.    Marland Kitchen donepezil (ARICEPT) 5 MG tablet Take 5 mg by mouth at bedtime.    Marland Kitchen escitalopram (LEXAPRO) 20 MG tablet Take 20 mg by mouth daily.     . folic acid (FOLVITE) 1 MG tablet Take 1 tablet (1 mg total) by mouth daily. 30 tablet 4  . lidocaine (XYLOCAINE) 2 % solution Use as directed 5 mLs in the mouth or throat every 3 (three) hours as needed for mouth pain. Gargle and spit 200 mL 1  . lisinopril (PRINIVIL,ZESTRIL) 5 MG tablet Take 5 mg by mouth daily.     . pantoprazole (PROTONIX) 40 MG tablet Take 1 tablet (40 mg total) by mouth daily. 30 tablet 0  . prochlorperazine (COMPAZINE) 10 MG tablet Take 1 tablet (10 mg total) by  mouth every 6 (six) hours as needed for nausea or vomiting. 30 tablet 0  . vitamin B-12 (CYANOCOBALAMIN) 500 MCG tablet Take 500 mcg by mouth daily.     No current facility-administered medications for this visit.     No Known Allergies  Review of Systems   Weight stable No fever No falls No shortness of breath Complains of poor short-term memory-head CT scan was negative for metastatic disease  BP (!) 144/88   Pulse (!) 110   Temp (!) 96.5 F (35.8 C) (Skin) Comment (Src): [place]  Resp 20   Ht 6\' 1"  (1.854 m)   Wt 233 lb (105.7 kg)   SpO2 96% Comment: RA  BMI 30.74 kg/m  Physical Exam Alert and appropriate Breath sounds clear and equal Heart rhythm regular no murmur Pleurx catheter site examined, suture removed, and clean dressing applied No abdominal tenderness  Diagnostic Tests: Images of PET  scan and CT scan format reviewed showing minimal pleural effusion and catheter in good position above the right hemidiaphragm  Impression: 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  Plan: Continue drainage of Pleurx catheter with home health nurse twice a week on Mondays and Thursdays.  Return in 4 weeks with chest x-ray to review situation.  Patient will call if drainage drops off significantly at which time the drainage schedule can be reduced to once a week.   Len Childs, MD [facility] [phone]

## 2018-07-12 ENCOUNTER — Encounter: Payer: Self-pay | Admitting: *Deleted

## 2018-07-12 NOTE — Progress Notes (Signed)
Oncology Nurse Navigator Documentation  Oncology Nurse Navigator Flowsheets 07/12/2018  Navigator Location [facility]  Referral date to RadOnc/MedOnc -  Navigator Encounter Type Telephone/I received a vm message from patient's neighbor [name] to call her.  I called but was unable to reach her but left my name to call if needed.   Telephone Outgoing Call  Abnormal Finding Date -  Confirmed Diagnosis Date -  Multidisiplinary Clinic Date -  Treatment Initiated Date -  Patient Visit Type -  Treatment Phase Treatment  Barriers/Navigation Needs Education  Education Other  Interventions Education  Coordination of Care Other  Education Method Verbal  Acuity Level 1  Acuity Level 2 -  Time Spent with Patient 15

## 2018-07-13 ENCOUNTER — Inpatient Hospital Stay: Payer: No Typology Code available for payment source | Attending: Internal Medicine | Admitting: Medical

## 2018-07-13 ENCOUNTER — Telehealth: Payer: Self-pay | Admitting: *Deleted

## 2018-07-13 ENCOUNTER — Other Ambulatory Visit: Payer: Self-pay

## 2018-07-13 VITALS — BP 147/102 | HR 94 | Temp 98.4°F | Resp 18 | Ht 73.0 in | Wt 233.2 lb

## 2018-07-13 DIAGNOSIS — R112 Nausea with vomiting, unspecified: Secondary | ICD-10-CM | POA: Diagnosis not present

## 2018-07-13 DIAGNOSIS — Z5111 Encounter for antineoplastic chemotherapy: Secondary | ICD-10-CM | POA: Insufficient documentation

## 2018-07-13 DIAGNOSIS — J90 Pleural effusion, not elsewhere classified: Secondary | ICD-10-CM | POA: Diagnosis not present

## 2018-07-13 DIAGNOSIS — Z87891 Personal history of nicotine dependence: Secondary | ICD-10-CM

## 2018-07-13 DIAGNOSIS — R21 Rash and other nonspecific skin eruption: Secondary | ICD-10-CM | POA: Diagnosis not present

## 2018-07-13 DIAGNOSIS — R197 Diarrhea, unspecified: Secondary | ICD-10-CM

## 2018-07-13 DIAGNOSIS — C3411 Malignant neoplasm of upper lobe, right bronchus or lung: Secondary | ICD-10-CM | POA: Insufficient documentation

## 2018-07-13 DIAGNOSIS — Z79899 Other long term (current) drug therapy: Secondary | ICD-10-CM | POA: Diagnosis not present

## 2018-07-13 DIAGNOSIS — R110 Nausea: Secondary | ICD-10-CM | POA: Insufficient documentation

## 2018-07-13 DIAGNOSIS — E876 Hypokalemia: Secondary | ICD-10-CM | POA: Insufficient documentation

## 2018-07-13 DIAGNOSIS — C3491 Malignant neoplasm of unspecified part of right bronchus or lung: Secondary | ICD-10-CM

## 2018-07-13 MED ORDER — SODIUM CHLORIDE 0.9 % IV SOLN: 10.0000 mg | Freq: Once | INTRAVENOUS | Status: DC

## 2018-07-13 MED ORDER — SODIUM CHLORIDE 0.9 % IV SOLN
Freq: Once | INTRAVENOUS | Status: AC
  Administered 2018-07-13: 11:00:00 via INTRAVENOUS
  Filled 2018-07-13: qty 250

## 2018-07-13 MED ORDER — DEXAMETHASONE SODIUM PHOSPHATE 10 MG/ML IJ SOLN
INTRAMUSCULAR | Status: AC
  Filled 2018-07-13: qty 1

## 2018-07-13 MED ORDER — DEXAMETHASONE SODIUM PHOSPHATE 10 MG/ML IJ SOLN
10.0000 mg | Freq: Once | INTRAMUSCULAR | Status: AC
  Administered 2018-07-13: 10 mg via INTRAVENOUS

## 2018-07-13 MED ORDER — ONDANSETRON HCL 4 MG/2ML IJ SOLN
INTRAMUSCULAR | Status: AC
  Filled 2018-07-13: qty 4

## 2018-07-13 MED ORDER — DIPHENOXYLATE-ATROPINE 2.5-0.025 MG PO TABS
2.0000 | ORAL_TABLET | Freq: Once | ORAL | Status: AC
  Administered 2018-07-13: 2 via ORAL

## 2018-07-13 MED ORDER — ONDANSETRON HCL 4 MG/2ML IJ SOLN
4.0000 mg | Freq: Once | INTRAMUSCULAR | Status: AC
  Administered 2018-07-13: 4 mg via INTRAVENOUS

## 2018-07-13 MED ORDER — DIPHENOXYLATE-ATROPINE 2.5-0.025 MG PO TABS
ORAL_TABLET | ORAL | Status: AC
  Filled 2018-07-13: qty 2

## 2018-07-13 NOTE — Telephone Encounter (Addendum)
Notified [name] to expect transportation at 9:00 am per transportation coordinator for [facility] visit today.  No lab per [facility].    [name] able to speak and breath well with conversation.  Denies shortness of breath.    Voicemail and call from [facility], Natividad Brood RN, [phone]).  "He received first chemotherapy recently and has broken out in a severe rash.  He's burning up with itching, rash that's almost blistering to neck, chest, upper torso going down almost to his belly button.  (First Carboplatin, Keytruda, Alimta 07-04-2018.)  I've applied Benadryl cream.   No thermometer found to check temperature.  Doesn't feel feverish to touch.   He vomited last night after dinner, no nausea at this time.  I'm a night nurse, here to change dressing and drain his PleuRx, challenge for dressing with the rash."

## 2018-07-13 NOTE — Progress Notes (Signed)
Pt reports feeling better after receiving IVF.  VSS.  Able to drink during infusion, tolerated well.  Ambulatory to exit alone with belongings.  Transportation by [employer].

## 2018-07-13 NOTE — Patient Instructions (Signed)

## 2018-07-14 NOTE — Progress Notes (Signed)
Symptoms Management Clinic Progress Note   Daniel Bryant [id number] Nov 28, 1960 58 y.o.  Daniel Bryant is managed by Dr. Fanny Bien. Daniel Bryant  Actively treated with chemotherapy/immunotherapy/hormonal therapy: yes  Current therapy: Carboplatin, Alimta, and Keytruda  Last treated: 07/04/2018 (cycle 1, day 1)  Next scheduled appointment with provider:   07/24/2018  Assessment: Plan:    Non-intractable vomiting with nausea, unspecified vomiting type - Plan: 0.9 %  sodium chloride infusion, ondansetron (ZOFRAN) injection 4 mg, dexamethasone (DECADRON) injection 10 mg, DISCONTINUED: dexamethasone (DECADRON) 10 mg in sodium chloride 0.9 % 50 mL IVPB  Diarrhea, unspecified type - Plan: diphenoxylate-atropine (LOMOTIL) 2.5-0.025 MG per tablet 2 tablet  Adenocarcinoma of right lung (HCC)   Nausea and vomiting: The patient was given 500 ml of normal saline today along with Zofran 4 mg IV Decadron 10 mg IV x1.  Diarrhea: The patient was given 2 tablets of Lomotil today.  A prescription for Lomotil was also sent to his pharmacy.  Stage IV (T1b, N2, M1) non-small cell lung cancer, adenocarcinoma: Daniel Bryant is status post cycle 1, day 1 of carboplatin, Alimta, and Keytruda which was dosed on 07/04/2018.  He is scheduled to return on 07/18/2018 for his next cycle of chemotherapy and will be seen by Dr. Julien Bryant on 07/24/2018.  Please see After Visit Summary for patient specific instructions.  Future Appointments  Date Time Provider Michalene Debruler Dyne  07/18/2018  8:00 AM [facility] None  07/18/2018  9:30 AM [facility] INFUSION [facility] None  07/24/2018  9:00 AM [facility] 2 [facility] None  07/24/2018  9:30 AM Curt Bears, MD [facility] None  07/24/2018 10:30 AM [facility] INFUSION [facility] None  07/31/2018  2:15 PM [facility] LAB 5 [facility] None  08/07/2018  2:15 PM [facility] LAB 2 [facility] None  08/14/2018  9:00 AM [facility] ONLY [facility]  None  08/14/2018  9:30 AM Curt Bears, MD [facility] None  08/14/2018 10:30 AM [facility] INFUSION [facility] None  08/15/2018 11:00 AM Ivin Poot, MD [facility]  08/21/2018  2:15 PM [facility] LAB 1 [facility] None  08/28/2018  2:15 PM [facility] LAB 1 [facility] None  09/04/2018  8:45 AM [facility] ONLY [facility] None  09/04/2018  9:15 AM Curt Bears, MD [facility] None  09/04/2018 10:15 AM [facility] INFUSION [facility] None    No orders of the defined types were placed in this encounter.      Subjective:   Patient ID:  Daniel Bryant is a 58 y.o. (DOB [birth year]) male.  Chief Complaint:  Chief Complaint  Patient presents with   Rash    HPI Daniel Bryant is a 58 year old male with a history of a stage IV (T1b, N2, M1) non-small cell lung cancer, adenocarcinoma which was diagnosed in January 2020 when he presented with a right upper lobe lung nodule in addition to mediastinal lymphadenopathy and a malignant right pleural effusion.  He is status post cycle 1, day 1 of carboplatin, Alimta, and Keytruda which was dosed on 07/04/2018.  He presents to the office today with nausea, diarrhea, and a rash over his chest, arms, neck, and right flank.  He states that the rash does not itch.  He reports that his symptoms began last evening.  He has not taken any medicine for diarrhea.  He denies fevers, chills, or sweats.  Medications: I have reviewed the patient's current medications.  Allergies:  Allergies  Allergen Reactions   Other Other (See Comments)    Patient states "I had a  scrotal procedure a couple of years ago, there was something they gave me to relax me and I started freaking out and seeing things".  Unable to ascertain from pt or his records what medication was.    Past Medical History:  Diagnosis Date   Asthma    Cancer ([place])    COPD (chronic obstructive pulmonary disease) (HCC)    GERD (gastroesophageal reflux disease)    High  cholesterol    History of petit-mal seizures    Hyperlipidemia    Hypertension    Memory impairment    OSA (obstructive sleep apnea)    Does not tolerate CPAP   Pacemaker    PTSD (post-traumatic stress disorder)    Sick sinus syndrome ([place])     Past Surgical History:  Procedure Laterality Date   CARDIAC SURGERY     CHEST TUBE INSERTION Right 05/16/2018   Procedure: INSERTION PLEURAL DRAINAGE CATHETER;  Surgeon: Ivin Poot, MD;  Location: [place];  Service: Thoracic;  Laterality: Right;   CHEST TUBE INSERTION Right 05/16/2018   Procedure: Chest Tube Insertion;  Surgeon: Ivin Poot, MD;  Location: [place];  Service: Thoracic;  Laterality: Right;   PACEMAKER INSERTION      Family History  Problem Relation Age of Onset   Alzheimer's disease Mother    Cancer Mother    Memory loss Father     Social History   Socioeconomic History   Marital status: Widowed    Spouse name: Not on file   Number of children: Not on file   Years of education: Not on file   Highest education level: Not on file  Occupational History   Not on file  Social Needs   Financial resource strain: Not on file   Food insecurity:    Worry: Not on file    Inability: Not on file   Transportation needs:    Medical: Not on file    Non-medical: Not on file  Tobacco Use   Smoking status: Former Smoker    Types: E-cigarettes   Smokeless tobacco: Current User    Types: Chew  Substance and Sexual Activity   Alcohol use: Yes    Frequency: Never   Drug use: No   Sexual activity: Not on file  Lifestyle   Physical activity:    Days per week: Not on file    Minutes per session: Not on file   Stress: Not on file  Relationships   Social connections:    Talks on phone: Not on file    Gets together: Not on file    Attends religious service: Not on file    Active member of club or organization: Not on file    Attends meetings of clubs or organizations: Not on file     Relationship status: Not on file   Intimate partner violence:    Fear of current or ex partner: Not on file    Emotionally abused: Not on file    Physically abused: Not on file    Forced sexual activity: Not on file  Other Topics Concern   Not on file  Social History Narrative   Not on file    Past Medical History, Surgical history, Social history, and Family history were reviewed and updated as appropriate.   Please see review of systems for further details on the patient's review from today.   Review of Systems:  Review of Systems  Constitutional: Negative for appetite change, chills, diaphoresis and fever.  HENT:  Negative for trouble swallowing and voice change.   Respiratory: Negative for cough, chest tightness, shortness of breath and wheezing.   Cardiovascular: Negative for chest pain, palpitations and leg swelling.  Gastrointestinal: Positive for diarrhea, nausea and vomiting. Negative for abdominal distention, abdominal pain, blood in stool and constipation.  Genitourinary: Negative for decreased urine volume and difficulty urinating.  Musculoskeletal: Negative for back pain and myalgias.  Skin: Positive for rash.  Neurological: Negative for dizziness, weakness, light-headedness and headaches.    Objective:   Physical Exam:  BP (!) 147/102 (BP Location: Left Arm, Patient Position: Sitting) Comment: Pt did not want to retake BP/ Liza RN was notified   Pulse 94    Temp 98.4 F (36.9 C) (Oral)    Resp 18    Ht 6\' 1"  (1.854 m)    Wt 233 lb 3.2 oz (105.8 kg)    SpO2 98%    BMI 30.77 kg/m  ECOG: 0  Physical Exam Constitutional:      General: He is not in acute distress.    Appearance: He is not diaphoretic.  HENT:     Head: Normocephalic and atraumatic.  Eyes:     General: No scleral icterus.       Right eye: No discharge.        Left eye: No discharge.     Conjunctiva/sclera: Conjunctivae normal.  Cardiovascular:     Rate and Rhythm: Normal rate and regular  rhythm.     Heart sounds: Normal heart sounds. No murmur. No friction rub. No gallop.   Pulmonary:     Effort: Pulmonary effort is normal. No respiratory distress.     Breath sounds: Normal breath sounds. No stridor. No wheezing or rales.  Musculoskeletal:        General: No deformity.  Skin:    General: Skin is warm and dry.     Findings: Erythema and rash present.     Comments: The patient has a diffuse erythematous rash over his neck, upper extremities, chest, and right flank.  There are multiple raised 2 to 3 mm lesions noted.  The patient has a dressing in his right flank at the site of a recent thoracentesis.  Neurological:     Mental Status: He is alert.     Coordination: Coordination normal.     Gait: Gait normal.  Psychiatric:        Mood and Affect: Mood normal.        Behavior: Behavior normal.        Thought Content: Thought content normal.        Judgment: Judgment normal.       Lab Review:     Component Value Date/Time   NA 139 07/10/2018 0928   K 3.5 07/10/2018 0928   CL 95 (L) 07/10/2018 0928   CO2 30 07/10/2018 0928   GLUCOSE 105 (H) 07/10/2018 0928   BUN 20 07/10/2018 0928   CREATININE 1.04 07/10/2018 0928   CALCIUM 8.9 07/10/2018 0928   PROT 7.6 07/10/2018 0928   ALBUMIN 3.6 07/10/2018 0928   AST 18 07/10/2018 0928   ALT 16 07/10/2018 0928   ALKPHOS 97 07/10/2018 0928   BILITOT 1.1 07/10/2018 0928   GFRNONAA >60 07/10/2018 0928   GFRAA >60 07/10/2018 0928       Component Value Date/Time   WBC 6.1 07/10/2018 0928   WBC 11.1 (H) 05/15/2018 1913   RBC 5.73 07/10/2018 0928   HGB 16.2 07/10/2018 0928   HCT 50.6 07/10/2018  0928   PLT 170 07/10/2018 0928   MCV 88.3 07/10/2018 0928   MCH 28.3 07/10/2018 0928   MCHC 32.0 07/10/2018 0928   RDW 13.1 07/10/2018 0928   LYMPHSABS 1.0 07/10/2018 0928   MONOABS 0.1 07/10/2018 0928   EOSABS 1.0 (H) 07/10/2018 0928   BASOSABS 0.0 07/10/2018 0928   -------------------------------  Imaging from last 24  hours (if applicable):  Radiology interpretation: Nm Pet Image Initial (pi) Skull Base To Thigh  Result Date: 07/10/2018 CLINICAL DATA:  Subsequent treatment strategy for lung carcinoma non-small cell lung cancer. EXAM: NUCLEAR MEDICINE PET SKULL BASE TO THIGH TECHNIQUE: 10.6 mCi F-18 FDG was injected intravenously. Full-ring PET imaging was performed from the skull base to thigh after the radiotracer. CT data was obtained and used for attenuation correction and anatomic localization. Fasting blood glucose: 08/12/2000 mg/dl COMPARISON:  CT 2182 FINDINGS: Mediastinal blood pool activity: SUV max 2.7 NECK: Focal activity in the LEFT posterior oropharynx in the region of the LEFT palatine tonsil. Lesion is intensely hypermetabolic with SUV max equal 12.6. Activity localizes to an approximately 2 cm lesion. There is a hypermetabolic lymph node in the adjacent LEFT level 2 nodal station anterior to the LEFT sternocleidomastoid muscle with SUV max equal 9.1. This node measures 12 mm (image 33/4) Incidental CT findings: none CHEST: There multiple hypermetabolic nodules scattered throughout the RIGHT pleural space and extending into the anterior RIGHT mediastinum. Additionally discrete hypermetabolic nodules within the the pulmonary parenchyma which for the most part are associated with the fissures. For example nodule along the oblique fissure with SUV max equal 11.1. Nodules in the lateral pleural space RIGHT upper lobe SUV max equal 13.2. Nodules adjacent to the RIGHT atrium within the anterior aspect the RIGHT mediastinum with SUV max equal 12.3. Additionally there is intensely hypermetabolic RIGHT lower paratracheal lymph node SUV max equal 13.5. No hypermetabolic nodules or pleural thickening in the LEFT lung. Incidental CT findings: none ABDOMEN/PELVIS: Several small hypermetabolic retroperitoneal nodes in the upper abdomen adjacent to the aorta and IVC which are intense for size. For example node posterior the  IVC with SUV max equal 7.3 measures approximately 4 mm. No abnormal activity in the liver. Adrenal glands are normal. No abnormal activity in the bowel. No pelvic adenopathy Incidental CT findings: none SKELETON: No focal hypermetabolic activity to suggest skeletal metastasis. Incidental CT findings: none IMPRESSION: 1. Multiple intensely hypermetabolic nodules in the right pleural space and extending along the fissures into the anterior mediastinum consistent with extensive pleural spread of bronchogenic carcinoma. 2. Hypermetabolic RIGHT lower paratracheal metastatic lymph node. 3. Extension of adenopathy into the upper retroperitoneum adjacent the aorta and IVC. 4. Hypermetabolic focus in the left posterior oropharynx (LEFT tonsil) with associated hypermetabolic LEFT level II lymph node. Differential would include primary head neck cancer versus less likely tonsillitis or metastatic lung cancer. Recommend neck CT or direct visualization. Electronically Signed   By: Suzy Bouchard M.D.   On: 07/10/2018 16:26

## 2018-07-17 ENCOUNTER — Telehealth: Payer: Self-pay | Admitting: *Deleted

## 2018-07-17 NOTE — Telephone Encounter (Signed)
SOncology Nurse Navigator Documentation  Oncology Nurse Navigator Flowsheets 07/17/2018  Navigator Location [facility]  Referral date to RadOnc/MedOnc -  Navigator Encounter Type Telephone/I received a call from patient's neighbor.  She states that patient purchased a car.  I was concerned about that and called to update [facility].  They have a psych referral for Mr. Elms.  I did update the neighbor on his next appt for treatment.  She will update patient and makes sure he gets to appt.   Telephone Incoming Call;Outgoing Call  Abnormal Finding Date -  Confirmed Diagnosis Date -  Multidisiplinary Clinic Date -  Treatment Initiated Date -  Patient Visit Type -  Treatment Phase Treatment  Barriers/Navigation Needs Education  Education Other  Interventions Education  Coordination of Care Other  Education Method Verbal  Acuity Level 1  Acuity Level 2 -  Time Spent with Patient 30

## 2018-07-18 ENCOUNTER — Other Ambulatory Visit: Payer: Medicaid Other

## 2018-07-18 ENCOUNTER — Ambulatory Visit: Payer: Medicaid Other

## 2018-07-18 ENCOUNTER — Ambulatory Visit: Payer: Medicaid Other | Admitting: Internal Medicine

## 2018-07-20 ENCOUNTER — Encounter: Payer: Self-pay | Admitting: *Deleted

## 2018-07-20 NOTE — Progress Notes (Signed)
Oncology Nurse Navigator Documentation  Oncology Nurse Navigator Flowsheets 07/20/2018  Navigator Location [facility]  Referral date to RadOnc/MedOnc -  Navigator Encounter Type Telephone/I checked on patient's schedule and his appt. Daniel Bryant received IV fluids last week.  I called to update [facility] with an update.    Telephone Incoming Call;Outgoing Call  Abnormal Finding Date -  Confirmed Diagnosis Date -  Multidisiplinary Clinic Date -  Treatment Initiated Date -  Patient Visit Type -  Treatment Phase Treatment  Barriers/Navigation Needs Coordination of Care  Education -  Interventions Coordination of Care  Coordination of Care Other  Education Method -  Acuity Level 1  Acuity Level 2 -  Time Spent with Patient 30

## 2018-07-22 DIAGNOSIS — C3491 Malignant neoplasm of unspecified part of right bronchus or lung: Secondary | ICD-10-CM

## 2018-07-22 DIAGNOSIS — J910 Malignant pleural effusion: Secondary | ICD-10-CM

## 2018-07-24 ENCOUNTER — Other Ambulatory Visit: Payer: Self-pay | Admitting: Medical Oncology

## 2018-07-24 ENCOUNTER — Encounter: Payer: Self-pay | Admitting: Internal Medicine

## 2018-07-24 ENCOUNTER — Other Ambulatory Visit: Payer: Self-pay

## 2018-07-24 ENCOUNTER — Inpatient Hospital Stay: Payer: No Typology Code available for payment source

## 2018-07-24 ENCOUNTER — Other Ambulatory Visit: Payer: Self-pay | Admitting: *Deleted

## 2018-07-24 ENCOUNTER — Inpatient Hospital Stay (HOSPITAL_BASED_OUTPATIENT_CLINIC_OR_DEPARTMENT_OTHER): Payer: No Typology Code available for payment source | Admitting: Internal Medicine

## 2018-07-24 VITALS — BP 168/102 | HR 91 | Temp 97.3°F | Resp 20 | Ht 73.0 in | Wt 242.2 lb

## 2018-07-24 VITALS — BP 160/95

## 2018-07-24 DIAGNOSIS — J910 Malignant pleural effusion: Secondary | ICD-10-CM

## 2018-07-24 DIAGNOSIS — Z5111 Encounter for antineoplastic chemotherapy: Secondary | ICD-10-CM

## 2018-07-24 DIAGNOSIS — C3411 Malignant neoplasm of upper lobe, right bronchus or lung: Secondary | ICD-10-CM | POA: Diagnosis not present

## 2018-07-24 DIAGNOSIS — C3491 Malignant neoplasm of unspecified part of right bronchus or lung: Secondary | ICD-10-CM

## 2018-07-24 DIAGNOSIS — E876 Hypokalemia: Secondary | ICD-10-CM | POA: Diagnosis not present

## 2018-07-24 DIAGNOSIS — R110 Nausea: Secondary | ICD-10-CM

## 2018-07-24 DIAGNOSIS — J449 Chronic obstructive pulmonary disease, unspecified: Secondary | ICD-10-CM

## 2018-07-24 DIAGNOSIS — R21 Rash and other nonspecific skin eruption: Secondary | ICD-10-CM

## 2018-07-24 DIAGNOSIS — L270 Generalized skin eruption due to drugs and medicaments taken internally: Secondary | ICD-10-CM

## 2018-07-24 LAB — CMP (CANCER CENTER ONLY)
ALT: 35 U/L (ref 0–44)
AST: 22 U/L (ref 15–41)
Albumin: 3.1 g/dL — ABNORMAL LOW (ref 3.5–5.0)
Alkaline Phosphatase: 121 U/L (ref 38–126)
Anion gap: 12 (ref 5–15)
BUN: 7 mg/dL (ref 6–20)
CO2: 30 mmol/L (ref 22–32)
Calcium: 8.5 mg/dL — ABNORMAL LOW (ref 8.9–10.3)
Chloride: 101 mmol/L (ref 98–111)
Creatinine: 1.12 mg/dL (ref 0.61–1.24)
GFR, Est AFR Am: >60 mL/min (ref >60)
GFR, Estimated: >60 mL/min (ref >60)
Glucose, Bld: 103 mg/dL — ABNORMAL HIGH (ref 70–99)
Potassium: 2.8 mmol/L — CL (ref 3.5–5.1)
Sodium: 143 mmol/L (ref 135–145)
Total Bilirubin: 0.3 mg/dL (ref 0.3–1.2)
Total Protein: 6.3 g/dL — ABNORMAL LOW (ref 6.5–8.1)

## 2018-07-24 LAB — CBC WITH DIFFERENTIAL (CANCER CENTER ONLY)
Abs Immature Granulocytes: 0.12 K/uL — ABNORMAL HIGH (ref 0.00–0.07)
Basophils Absolute: 0.1 K/uL (ref 0.0–0.1)
Basophils Relative: 1 %
Eosinophils Absolute: 2.8 K/uL — ABNORMAL HIGH (ref 0.0–0.5)
Eosinophils Relative: 28 %
HCT: 45.1 % (ref 39.0–52.0)
Hemoglobin: 14.9 g/dL (ref 13.0–17.0)
Immature Granulocytes: 1 %
Lymphocytes Relative: 18 %
Lymphs Abs: 1.7 K/uL (ref 0.7–4.0)
MCH: 28.7 pg (ref 26.0–34.0)
MCHC: 33 g/dL (ref 30.0–36.0)
MCV: 86.9 fL (ref 80.0–100.0)
Monocytes Absolute: 1 K/uL (ref 0.1–1.0)
Monocytes Relative: 10 %
Neutro Abs: 4.1 K/uL (ref 1.7–7.7)
Neutrophils Relative %: 42 %
Platelet Count: 282 K/uL (ref 150–400)
RBC: 5.19 MIL/uL (ref 4.22–5.81)
RDW: 14.3 % (ref 11.5–15.5)
WBC Count: 9.9 K/uL (ref 4.0–10.5)
nRBC: 0 % (ref 0.0–0.2)

## 2018-07-24 LAB — TSH: TSH: 1.698 u[IU]/mL (ref 0.320–4.118)

## 2018-07-24 MED ORDER — SODIUM CHLORIDE 0.9 % IV SOLN
510.0000 mg/m2 | Freq: Once | INTRAVENOUS | Status: AC
  Administered 2018-07-24: 1200 mg via INTRAVENOUS
  Filled 2018-07-24: qty 40

## 2018-07-24 MED ORDER — PALONOSETRON HCL INJECTION 0.25 MG/5ML
INTRAVENOUS | Status: AC
  Filled 2018-07-24: qty 5

## 2018-07-24 MED ORDER — SODIUM CHLORIDE 0.9 % IV SOLN
665.5000 mg | Freq: Once | INTRAVENOUS | Status: AC
  Administered 2018-07-24: 670 mg via INTRAVENOUS
  Filled 2018-07-24: qty 67

## 2018-07-24 MED ORDER — PREDNISONE 20 MG PO TABS: ORAL_TABLET | ORAL | 0 refills | Status: DC

## 2018-07-24 MED ORDER — SODIUM CHLORIDE 0.9 % IV SOLN
Freq: Once | INTRAVENOUS | Status: AC
  Administered 2018-07-24: 11:00:00 via INTRAVENOUS
  Filled 2018-07-24: qty 250

## 2018-07-24 MED ORDER — PALONOSETRON HCL INJECTION 0.25 MG/5ML
0.2500 mg | Freq: Once | INTRAVENOUS | Status: AC
  Administered 2018-07-24: 12:00:00 0.25 mg via INTRAVENOUS

## 2018-07-24 MED ORDER — SODIUM CHLORIDE 0.9 % IV SOLN
Freq: Once | INTRAVENOUS | Status: AC
  Administered 2018-07-24: 11:00:00 via INTRAVENOUS
  Filled 2018-07-24: qty 500

## 2018-07-24 MED ORDER — SODIUM CHLORIDE 0.9% FLUSH
10.0000 mL | INTRAVENOUS | Status: DC | PRN
  Filled 2018-07-24: qty 10

## 2018-07-24 MED ORDER — PROCHLORPERAZINE MALEATE 10 MG PO TABS: 10.0000 mg | ORAL_TABLET | Freq: Four times a day (QID) | ORAL | 0 refills | Status: DC | PRN

## 2018-07-24 MED ORDER — SODIUM CHLORIDE 0.9 % IV SOLN
Freq: Once | INTRAVENOUS | Status: AC
  Administered 2018-07-24: 12:00:00 via INTRAVENOUS
  Filled 2018-07-24: qty 5

## 2018-07-24 MED ORDER — PROCHLORPERAZINE MALEATE 10 MG PO TABS: 10.0000 mg | ORAL_TABLET | Freq: Four times a day (QID) | ORAL | 0 refills | Status: AC | PRN

## 2018-07-24 NOTE — Patient Instructions (Signed)
[facility] Discharge Instructions for Patients Receiving Chemotherapy  Today you received the following chemotherapy agents Alimta and Carboplatin.  To help prevent nausea and vomiting after your treatment, we encourage you to take your nausea medication as directed.  If you develop nausea and vomiting that is not controlled by your nausea medication, call the clinic.   BELOW ARE SYMPTOMS THAT SHOULD BE REPORTED IMMEDIATELY:  *FEVER GREATER THAN 100.5 F  *CHILLS WITH OR WITHOUT FEVER  NAUSEA AND VOMITING THAT IS NOT CONTROLLED WITH YOUR NAUSEA MEDICATION  *UNUSUAL SHORTNESS OF BREATH  *UNUSUAL BRUISING OR BLEEDING  TENDERNESS IN MOUTH AND THROAT WITH OR WITHOUT PRESENCE OF ULCERS  *URINARY PROBLEMS  *BOWEL PROBLEMS  UNUSUAL RASH Items with * indicate a potential emergency and should be followed up as soon as possible.  Feel free to call the clinic should you have any questions or concerns. The clinic phone number is [phone].  Please show the [facility] at check-in to the Emergency Department and triage nurse.   Hypokalemia Hypokalemia means that the amount of potassium in the blood is lower than normal.Potassium is a chemical that helps regulate the amount of fluid in the body (electrolyte). It also stimulates muscle tightening (contraction) and helps nerves work properly.Normally, most of the body's potassium is inside of cells, and only a very small amount is in the blood. Because the amount in the blood is so small, minor changes to potassium levels in the blood can be life-threatening. What are the causes? This condition may be caused by:  Antibiotic medicine.  Diarrhea or vomiting. Taking too much of a medicine that helps you have a bowel movement (laxative) can cause diarrhea and lead to hypokalemia.  Chronic kidney disease (CKD).  Medicines that help the body get rid of excess fluid (diuretics).  Eating disorders, such as bulimia.   Low magnesium levels in the body.  Sweating a lot. What are the signs or symptoms? Symptoms of this condition include:  Weakness.  Constipation.  Fatigue.  Muscle cramps.  Mental confusion.  Skipped heartbeats or irregular heartbeat (palpitations).  Tingling or numbness. How is this diagnosed? This condition is diagnosed with a blood test. How is this treated? Hypokalemia can be treated by taking potassium supplements by mouth or adjusting the medicines that you take. Treatment may also include eating more foods that contain a lot of potassium. If your potassium level is very low, you may need to get potassium through an IV tube in one of your veins and be monitored in the hospital. Follow these instructions at home:   Take over-the-counter and prescription medicines only as told by your health care provider. This includes vitamins and supplements.  Eat a healthy diet. A healthy diet includes fresh fruits and vegetables, whole grains, healthy fats, and lean proteins.  If instructed, eat more foods that contain a lot of potassium, such as: ? Nuts, such as peanuts and pistachios. ? Seeds, such as sunflower seeds and pumpkin seeds. ? Peas, lentils, and lima beans. ? Whole grain and bran cereals and breads. ? Fresh fruits and vegetables, such as apricots, avocado, bananas, cantaloupe, kiwi, oranges, tomatoes, asparagus, and potatoes. ? Orange juice. ? Tomato juice. ? Red meats. ? Yogurt.  Keep all follow-up visits as told by your health care provider. This is important. Contact a health care provider if:  You have weakness that gets worse.  You feel your heart pounding or racing.  You vomit.  You have diarrhea.  You have diabetes (diabetes mellitus) and you have trouble keeping your blood sugar (glucose) in your target range. Get help right away if:  You have chest pain.  You have shortness of breath.  You have vomiting or diarrhea that lasts for more than 2  days.  You faint. This information is not intended to replace advice given to you by your health care provider. Make sure you discuss any questions you have with your health care provider. Document Released: 02/28/2005 Document Revised: 10/17/2015 Document Reviewed: 10/17/2015 Elsevier Interactive Patient Education  2019 [employer].

## 2018-07-24 NOTE — Progress Notes (Signed)
Patient c/o of burning to PIV site with [place] infusing at 166ml/hr. After confirming blood return and proper placement of PIV,rate decreased to 181ml/hr and patient better able to tolerate. Confirmed with pharmacy that this rate is OK.

## 2018-07-24 NOTE — Progress Notes (Signed)
[facility] [phone]   [phone]  OFFICE PROGRESS NOTE  Default, Provider, MD No address on file  DIAGNOSIS: Stage IV (T1b, N2, M1) non-small cell lung cancer, adenocarcinoma diagnosed in January 2020.  He presented with right upper lobe lung nodule in addition to mediastinal lymphadenopathy and malignant right pleural effusion.  PRIOR THERAPY: None  CURRENT THERAPY: Systemic chemotherapy with carboplatin for AUC of 5, Alimta 500 mg/M2 and Keytruda 200 mg IV every 3 weeks status post 1 cycle.  First dose was given on July 04, 2018.  INTERVAL HISTORY: Daniel Bryant 58 y.o. male returns to the clinic today for follow-up visit.  The patient is feeling fine today with no concerning complaints except for extensive skin rash started after the first cycle of his treatment. It is involving the upper extremities, chest and back.  The patient denied having any chest pain, shortness of breath, cough or hemoptysis.  He denied having any fever or chills.  He has no nausea, vomiting, diarrhea or constipation.  He had a PET scan performed recently and he is here today for evaluation before starting cycle #2.   MEDICAL HISTORY: Past Medical History:  Diagnosis Date   Asthma    Cancer ([employer])    COPD (chronic obstructive pulmonary disease) (HCC)    GERD (gastroesophageal reflux disease)    High cholesterol    History of petit-mal seizures    Hyperlipidemia    Hypertension    Memory impairment    OSA (obstructive sleep apnea)    Does not tolerate CPAP   Pacemaker    PTSD (post-traumatic stress disorder)    Sick sinus syndrome (HCC)     ALLERGIES:  is allergic to other.  MEDICATIONS:  Current Outpatient Medications  Medication Sig Dispense Refill   albuterol (PROVENTIL) (2.5 MG/3ML) 0.083% nebulizer solution Take 3 mLs (2.5 mg total) by nebulization 2 (two) times daily as needed for wheezing or shortness of breath. 75 mL 0    aspirin EC 81 MG EC tablet Take 1 tablet (81 mg total) by mouth daily.     atorvastatin (LIPITOR) 40 MG tablet Take 40 mg by mouth at bedtime.     Cholecalciferol (VITAMIN D3) 25 MCG (1000 UT) CAPS Take 1,000 Units by mouth daily.     donepezil (ARICEPT) 5 MG tablet Take 5 mg by mouth at bedtime.     escitalopram (LEXAPRO) 20 MG tablet Take 20 mg by mouth daily.      folic acid (FOLVITE) 1 MG tablet Take 1 tablet (1 mg total) by mouth daily. 30 tablet 4   lisinopril (PRINIVIL,ZESTRIL) 5 MG tablet Take 5 mg by mouth daily.      pantoprazole (PROTONIX) 40 MG tablet Take 1 tablet (40 mg total) by mouth daily. 30 tablet 0   vitamin B-12 (CYANOCOBALAMIN) 500 MCG tablet Take 500 mcg by mouth daily.     lidocaine (XYLOCAINE) 2 % solution Use as directed 5 mLs in the mouth or throat every 3 (three) hours as needed for mouth pain. Gargle and spit (Patient not taking: Reported on 07/24/2018) 200 mL 1   prochlorperazine (COMPAZINE) 10 MG tablet Take 1 tablet (10 mg total) by mouth every 6 (six) hours as needed for nausea or vomiting. (Patient not taking: Reported on 07/24/2018) 30 tablet 0   No current facility-administered medications for this visit.     SURGICAL HISTORY:  Past Surgical History:  Procedure Laterality Date   CARDIAC SURGERY  CHEST TUBE INSERTION Right 05/16/2018   Procedure: INSERTION PLEURAL DRAINAGE CATHETER;  Surgeon: Ivin Poot, MD;  Location: [facility];  Service: Thoracic;  Laterality: Right;   CHEST TUBE INSERTION Right 05/16/2018   Procedure: Chest Tube Insertion;  Surgeon: Ivin Poot, MD;  Location: [facility];  Service: Thoracic;  Laterality: Right;   PACEMAKER INSERTION      REVIEW OF SYSTEMS:  Constitutional: positive for fatigue Eyes: negative Ears, nose, mouth, throat, and face: negative Respiratory: negative Cardiovascular: negative Gastrointestinal: negative Genitourinary:negative Integument/breast: positive for rash Hematologic/lymphatic:  negative Musculoskeletal:negative Neurological: negative Behavioral/Psych: negative Endocrine: negative Allergic/Immunologic: negative   PHYSICAL EXAMINATION: General appearance: alert, cooperative and no distress Head: Normocephalic, without obvious abnormality, atraumatic Neck: no adenopathy, no JVD, supple, symmetrical, trachea midline and thyroid not enlarged, symmetric, no tenderness/mass/nodules Lymph nodes: Cervical, supraclavicular, and axillary nodes normal. Resp: clear to auscultation bilaterally Back: symmetric, no curvature. ROM normal. No CVA tenderness. Cardio: regular rate and rhythm, S1, S2 normal, no murmur, click, rub or gallop GI: soft, non-tender; bowel sounds normal; no masses,  no organomegaly Extremities: extremities normal, atraumatic, no cyanosis or edema Neurologic: Alert and oriented X 3, normal strength and tone. Normal symmetric reflexes. Normal coordination and gait  Skin rash: Showed extensive papular rash on the upper and lower extremities as well as abdomen and back.  ECOG PERFORMANCE STATUS: 1 - Symptomatic but completely ambulatory  Blood pressure (!) 168/102, pulse 91, temperature (!) 97.3 F (36.3 C), temperature source Oral, resp. rate 20, height 6\' 1"  (1.854 m), weight 242 lb 3.2 oz (109.9 kg), SpO2 98 %.  LABORATORY DATA: Lab Results  Component Value Date   WBC 9.9 07/24/2018   HGB 14.9 07/24/2018   HCT 45.1 07/24/2018   MCV 86.9 07/24/2018   PLT 282 07/24/2018      Chemistry      Component Value Date/Time   NA 143 07/24/2018 0902   K 2.8 (LL) 07/24/2018 0902   CL 101 07/24/2018 0902   CO2 30 07/24/2018 0902   BUN 7 07/24/2018 0902   CREATININE 1.12 07/24/2018 0902      Component Value Date/Time   CALCIUM 8.5 (L) 07/24/2018 0902   ALKPHOS 121 07/24/2018 0902   AST 22 07/24/2018 0902   ALT 35 07/24/2018 0902   BILITOT 0.3 07/24/2018 0902       RADIOGRAPHIC STUDIES: Nm Pet Image Initial (pi) Skull Base To Thigh  Result  Date: 07/10/2018 CLINICAL DATA:  Subsequent treatment strategy for lung carcinoma non-small cell lung cancer. EXAM: NUCLEAR MEDICINE PET SKULL BASE TO THIGH TECHNIQUE: 10.6 mCi F-18 FDG was injected intravenously. Full-ring PET imaging was performed from the skull base to thigh after the radiotracer. CT data was obtained and used for attenuation correction and anatomic localization. Fasting blood glucose: 08/12/2000 mg/dl COMPARISON:  CT 2182 FINDINGS: Mediastinal blood pool activity: SUV max 2.7 NECK: Focal activity in the LEFT posterior oropharynx in the region of the LEFT palatine tonsil. Lesion is intensely hypermetabolic with SUV max equal 12.6. Activity localizes to an approximately 2 cm lesion. There is a hypermetabolic lymph node in the adjacent LEFT level 2 nodal station anterior to the LEFT sternocleidomastoid muscle with SUV max equal 9.1. This node measures 12 mm (image 33/4) Incidental CT findings: none CHEST: There multiple hypermetabolic nodules scattered throughout the RIGHT pleural space and extending into the anterior RIGHT mediastinum. Additionally discrete hypermetabolic nodules within the the pulmonary parenchyma which for the most part are associated with the fissures. For example  nodule along the oblique fissure with SUV max equal 11.1. Nodules in the lateral pleural space RIGHT upper lobe SUV max equal 13.2. Nodules adjacent to the RIGHT atrium within the anterior aspect the RIGHT mediastinum with SUV max equal 12.3. Additionally there is intensely hypermetabolic RIGHT lower paratracheal lymph node SUV max equal 13.5. No hypermetabolic nodules or pleural thickening in the LEFT lung. Incidental CT findings: none ABDOMEN/PELVIS: Several small hypermetabolic retroperitoneal nodes in the upper abdomen adjacent to the aorta and IVC which are intense for size. For example node posterior the IVC with SUV max equal 7.3 measures approximately 4 mm. No abnormal activity in the liver. Adrenal glands are  normal. No abnormal activity in the bowel. No pelvic adenopathy Incidental CT findings: none SKELETON: No focal hypermetabolic activity to suggest skeletal metastasis. Incidental CT findings: none IMPRESSION: 1. Multiple intensely hypermetabolic nodules in the right pleural space and extending along the fissures into the anterior mediastinum consistent with extensive pleural spread of bronchogenic carcinoma. 2. Hypermetabolic RIGHT lower paratracheal metastatic lymph node. 3. Extension of adenopathy into the upper retroperitoneum adjacent the aorta and IVC. 4. Hypermetabolic focus in the left posterior oropharynx (LEFT tonsil) with associated hypermetabolic LEFT level II lymph node. Differential would include primary head neck cancer versus less likely tonsillitis or metastatic lung cancer. Recommend neck CT or direct visualization. Electronically Signed   By: Suzy Bouchard M.D.   On: 07/10/2018 16:26    ASSESSMENT AND PLAN: This is a very pleasant 58 years old white male with metastatic non-small cell lung cancer, adenocarcinoma currently undergoing systemic chemotherapy with carboplatin, Alimta and Keytruda status post 1 cycle. He tolerated the first cycle of this treatment well except for the extensive skin rash likely secondary to his treatment with Keytruda. I recommended for the patient to proceed with cycle #2 today but I will hold his treatment with [facility] for now. For the extensive skin rash I will start the patient on a tapering dose of prednisone starting at 100 mg p.o. daily for 1 week followed by 80 mg p.o. daily for 1 week followed by 60 mg p.o. daily for 1 week followed by 40 mg p.o. daily for 1 week followed by 20 mg p.o. daily for 1 weeks then 10 mg p.o. daily for 1 week. I will give the patient refill for his Compazine today. For the hypokalemia, we will give the patient 40 mEq of potassium chloride during his treatment today. He will come back for follow-up visit in 3 weeks for  evaluation before starting cycle #3. He was advised to call immediately if he has any concerning symptoms in the interval The patient voices understanding of current disease status and treatment options and is in agreement with the current care plan.  All questions were answered. The patient knows to call the clinic with any problems, questions or concerns. We can certainly see the patient much sooner if necessary.  Disclaimer: This note was dictated with voice recognition software. Similar sounding words can inadvertently be transcribed and may not be corrected upon review.

## 2018-07-25 ENCOUNTER — Other Ambulatory Visit: Payer: Medicaid Other

## 2018-07-25 ENCOUNTER — Telehealth: Payer: Self-pay | Admitting: Internal Medicine

## 2018-07-25 NOTE — Telephone Encounter (Signed)
Scheduled appt per 5/12 los - pt to get an updated schedule next visit.

## 2018-07-30 ENCOUNTER — Encounter (HOSPITAL_COMMUNITY): Payer: Self-pay

## 2018-07-30 ENCOUNTER — Encounter: Payer: Self-pay | Admitting: *Deleted

## 2018-07-30 ENCOUNTER — Other Ambulatory Visit: Payer: Self-pay

## 2018-07-30 ENCOUNTER — Telehealth: Payer: Self-pay | Admitting: Emergency Medicine

## 2018-07-30 ENCOUNTER — Emergency Department (HOSPITAL_COMMUNITY)
Admission: EM | Admit: 2018-07-30 | Discharge: 2018-07-30 | Disposition: A | Discharge status: Home / Self Care | Payer: Medicaid Other | Attending: Emergency Medicine | Admitting: Emergency Medicine

## 2018-07-30 DIAGNOSIS — L270 Generalized skin eruption due to drugs and medicaments taken internally: Secondary | ICD-10-CM | POA: Diagnosis not present

## 2018-07-30 DIAGNOSIS — Z79899 Other long term (current) drug therapy: Secondary | ICD-10-CM | POA: Insufficient documentation

## 2018-07-30 DIAGNOSIS — J449 Chronic obstructive pulmonary disease, unspecified: Secondary | ICD-10-CM | POA: Insufficient documentation

## 2018-07-30 DIAGNOSIS — Z5111 Encounter for antineoplastic chemotherapy: Secondary | ICD-10-CM

## 2018-07-30 DIAGNOSIS — Z950 Presence of cardiac pacemaker: Secondary | ICD-10-CM | POA: Insufficient documentation

## 2018-07-30 DIAGNOSIS — R21 Rash and other nonspecific skin eruption: Secondary | ICD-10-CM | POA: Diagnosis present

## 2018-07-30 DIAGNOSIS — I10 Essential (primary) hypertension: Secondary | ICD-10-CM | POA: Insufficient documentation

## 2018-07-30 DIAGNOSIS — F17220 Nicotine dependence, chewing tobacco, uncomplicated: Secondary | ICD-10-CM | POA: Diagnosis not present

## 2018-07-30 DIAGNOSIS — Z7982 Long term (current) use of aspirin: Secondary | ICD-10-CM | POA: Insufficient documentation

## 2018-07-30 DIAGNOSIS — R110 Nausea: Secondary | ICD-10-CM

## 2018-07-30 DIAGNOSIS — Z85118 Personal history of other malignant neoplasm of bronchus and lung: Secondary | ICD-10-CM | POA: Diagnosis not present

## 2018-07-30 LAB — CBC WITH DIFFERENTIAL/PLATELET
Abs Immature Granulocytes: 0.03 10*3/uL (ref 0.00–0.07)
Basophils Absolute: 0 10*3/uL (ref 0.0–0.1)
Basophils Relative: 1 %
Eosinophils Absolute: 1.1 10*3/uL — ABNORMAL HIGH (ref 0.0–0.5)
Eosinophils Relative: 30 %
HCT: 37.4 % — ABNORMAL LOW (ref 39.0–52.0)
Hemoglobin: 12.8 g/dL — ABNORMAL LOW (ref 13.0–17.0)
Immature Granulocytes: 1 %
Lymphocytes Relative: 12 %
Lymphs Abs: 0.5 10*3/uL — ABNORMAL LOW (ref 0.7–4.0)
MCH: 30 pg (ref 26.0–34.0)
MCHC: 34.2 g/dL (ref 30.0–36.0)
MCV: 87.8 fL (ref 80.0–100.0)
Monocytes Absolute: 0.1 10*3/uL (ref 0.1–1.0)
Monocytes Relative: 2 %
Neutro Abs: 2.1 10*3/uL (ref 1.7–7.7)
Neutrophils Relative %: 54 %
Platelets: 84 10*3/uL — ABNORMAL LOW (ref 150–400)
RBC: 4.26 MIL/uL (ref 4.22–5.81)
RDW: 14.4 % (ref 11.5–15.5)
WBC: 3.8 10*3/uL — ABNORMAL LOW (ref 4.0–10.5)
nRBC: 0 % (ref 0.0–0.2)

## 2018-07-30 LAB — SEDIMENTATION RATE: Sed Rate: 26 mm/hr — ABNORMAL HIGH (ref 0–16)

## 2018-07-30 LAB — COMPREHENSIVE METABOLIC PANEL
ALT: 23 U/L (ref 0–44)
AST: 28 U/L (ref 15–41)
Albumin: 3 g/dL — ABNORMAL LOW (ref 3.5–5.0)
Alkaline Phosphatase: 79 U/L (ref 38–126)
Anion gap: 11 (ref 5–15)
BUN: 18 mg/dL (ref 6–20)
CO2: 29 mmol/L (ref 22–32)
Calcium: 8 mg/dL — ABNORMAL LOW (ref 8.9–10.3)
Chloride: 96 mmol/L — ABNORMAL LOW (ref 98–111)
Creatinine, Ser: 1.02 mg/dL (ref 0.61–1.24)
GFR calc Af Amer: >60 mL/min (ref >60)
GFR calc non Af Amer: >60 mL/min (ref >60)
Glucose, Bld: 112 mg/dL — ABNORMAL HIGH (ref 70–99)
Potassium: 3 mmol/L — ABNORMAL LOW (ref 3.5–5.1)
Sodium: 136 mmol/L (ref 135–145)
Total Bilirubin: 1.7 mg/dL — ABNORMAL HIGH (ref 0.3–1.2)
Total Protein: 5.7 g/dL — ABNORMAL LOW (ref 6.5–8.1)

## 2018-07-30 LAB — C-REACTIVE PROTEIN: CRP: 7.1 mg/dL — ABNORMAL HIGH (ref <1.0)

## 2018-07-30 MED ORDER — DEXAMETHASONE SODIUM PHOSPHATE 10 MG/ML IJ SOLN
10.0000 mg | Freq: Once | INTRAMUSCULAR | Status: AC
  Administered 2018-07-30: 10 mg via INTRAVENOUS
  Filled 2018-07-30: qty 1

## 2018-07-30 MED ORDER — PREDNISONE 20 MG PO TABS: ORAL_TABLET | ORAL | 0 refills | Status: DC

## 2018-07-30 MED ORDER — DIPHENHYDRAMINE HCL 50 MG/ML IJ SOLN
25.0000 mg | Freq: Once | INTRAMUSCULAR | Status: AC
  Administered 2018-07-30: 11:00:00 25 mg via INTRAVENOUS
  Filled 2018-07-30: qty 1

## 2018-07-30 MED ORDER — POTASSIUM CHLORIDE CRYS ER 20 MEQ PO TBCR
40.0000 meq | EXTENDED_RELEASE_TABLET | Freq: Once | ORAL | Status: AC
  Administered 2018-07-30: 12:00:00 40 meq via ORAL
  Filled 2018-07-30: qty 2

## 2018-07-30 MED ORDER — PREDNISONE 10 MG PO TABS: ORAL_TABLET | ORAL | 0 refills | Status: DC

## 2018-07-30 MED ORDER — POTASSIUM CHLORIDE 20 MEQ/15ML (10%) PO SOLN
20.0000 meq | Freq: Once | ORAL | Status: AC
  Administered 2018-07-30: 20 meq via ORAL
  Filled 2018-07-30: qty 15

## 2018-07-30 MED ORDER — DIPHENHYDRAMINE HCL 25 MG PO TABS: 25.0000 mg | ORAL_TABLET | Freq: Four times a day (QID) | ORAL | 0 refills | Status: DC | PRN

## 2018-07-30 MED ORDER — SODIUM CHLORIDE 0.9 % IV BOLUS
1000.0000 mL | Freq: Once | INTRAVENOUS | Status: AC
  Administered 2018-07-30: 1000 mL via INTRAVENOUS

## 2018-07-30 NOTE — ED Notes (Signed)
Bed: RK27 Expected date:  Expected time:  Means of arrival:  Comments: EMS cancer rash

## 2018-07-30 NOTE — Telephone Encounter (Signed)
Received call back from [place], [place] with [facility]. She is at pt's home and states that pt is 'swollen and red all over with blisters on both legs'.  Pamala Hurry, RN states that pt is weak and can't walk from his bed to his bathroom without assist. As reported earlier, pt did not pick up his prescription for Predisone  last week as instructed. Again, advised that pt go to the [facility] for evaluation and treatment. Pamala Hurry, RN voiced understanding.

## 2018-07-30 NOTE — ED Provider Notes (Signed)
[facility] DEPT Provider Note   CSN: [id number] Arrival date & time: 07/30/18  7829    History   Chief Complaint No chief complaint on file.   HPI Daniel Bryant is a 58 y.o. male.     The history is provided by the patient and medical records. No language interpreter was used.  Rash     58 year old male with history of stage IV non-small cell lung cancer diagnosed in January 2020 heart currently receiving systemic chemotherapy with carboplatin, Alimta, and Keytruda presenting for evaluation of a rash.  Patient report gradual onset of worsening rash since the start of his chemotherapy.  Rash initially started in his arms and has now spread throughout his entire body.  He described it as itchiness and throbbing pain.  Rash is not associate with fever, headache, trouble swallowing, chest pain, shortness of breath, or abnormal abdominal cramping.  He denies joint pain.  He reported having approximately 2 treatments of chemotherapy with the last one less than a week ago.  He did try using Benadryl twice but it provided minimal relief.  Aside from resting at home, no other specific treatment tried.  No other environmental changes.    Past Medical History:  Diagnosis Date  . Asthma   . Cancer ([place])   . COPD (chronic obstructive pulmonary disease) ([place])   . GERD (gastroesophageal reflux disease)   . High cholesterol   . History of petit-mal seizures   . Hyperlipidemia   . Hypertension   . Memory impairment   . OSA (obstructive sleep apnea)    Does not tolerate CPAP  . Pacemaker   . PTSD (post-traumatic stress disorder)   . Sick sinus syndrome [facility])     Patient Active Problem List   Diagnosis Date Noted  . Drug-induced skin rash 07/24/2018  . Goals of care, counseling/discussion 06/14/2018  . Encounter for antineoplastic chemotherapy 06/14/2018  . Encounter for antineoplastic immunotherapy 06/14/2018  . Adenocarcinoma of right lung ([place])  05/02/2018  . COPD (chronic obstructive pulmonary disease) ([place]) 05/02/2018  . Recurrent pleural effusion on right 05/01/2018  . Pacemaker 04/21/2018  . 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 Hypokalemia 04/05/2018  . High cholesterol 04/05/2018  . Hypertension 04/05/2018  . Polycythemia 04/05/2018    Past Surgical History:  Procedure Laterality Date  . CARDIAC SURGERY    . CHEST TUBE INSERTION Right 05/16/2018   Procedure: INSERTION PLEURAL DRAINAGE CATHETER;  Surgeon: Ivin Poot, MD;  Location: [facility];  Service: Thoracic;  Laterality: Right;  . CHEST TUBE INSERTION Right 05/16/2018   Procedure: Chest Tube Insertion;  Surgeon: Ivin Poot, MD;  Location: [place];  Service: Thoracic;  Laterality: Right;  . PACEMAKER INSERTION          Home Medications    Prior to Admission medications   Medication Sig Start Date End Date Taking? Authorizing Provider  albuterol (PROVENTIL) (2.5 MG/3ML) 0.083% nebulizer solution Take 3 mLs (2.5 mg total) by nebulization 2 (two) times daily as needed for wheezing or shortness of breath. 04/09/18   Kayleen Memos, DO  aspirin EC 81 MG EC tablet Take 1 tablet (81 mg total) by mouth daily. 05/22/18   Antony Odea, PA-C  atorvastatin (LIPITOR) 40 MG tablet Take 40 mg by mouth at bedtime.    [provider]  Cholecalciferol (VITAMIN D3) 25 MCG (1000 UT) CAPS Take 1,000 Units by mouth daily.    [provider]  donepezil (ARICEPT) 5 MG tablet  Take 5 mg by mouth at bedtime.    [provider]  escitalopram (LEXAPRO) 20 MG tablet Take 20 mg by mouth daily.     [provider]  folic acid (FOLVITE) 1 MG tablet Take 1 tablet (1 mg total) by mouth daily. 06/14/18   Curt Bears, MD  lidocaine (XYLOCAINE) 2 % solution Use as directed 5 mLs in the mouth or throat every 3 (three) hours as needed for mouth pain. Gargle and spit Patient not taking: Reported on 07/24/2018 07/04/18   [name] E., PA-C  lisinopril  (PRINIVIL,ZESTRIL) 5 MG tablet Take 5 mg by mouth daily.     [provider]  pantoprazole (PROTONIX) 40 MG tablet Take 1 tablet (40 mg total) by mouth daily. 04/10/18   Kayleen Memos, DO  predniSONE (DELTASONE) 20 MG tablet 5 tablet p.o. daily for 1 week then 4 tablets p.o. daily for 1 week then 3 tablets p.o. daily for 1 week then 2 tablets  p.o. daily for 1 week then 1 tablet p.o. daily for 1 week. 07/24/18   Curt Bears, MD  prochlorperazine (COMPAZINE) 10 MG tablet Take 1 tablet (10 mg total) by mouth every 6 (six) hours as needed for nausea or vomiting. 07/24/18   Curt Bears, MD  vitamin B-12 (CYANOCOBALAMIN) 500 MCG tablet Take 500 mcg by mouth daily.    [provider]    Family History Family History  Problem Relation Age of Onset  . Alzheimer's disease Mother   . Cancer Mother   . Memory loss Father     Social History Social History   Tobacco Use  . Smoking status: Former Smoker    Types: E-cigarettes  . Smokeless tobacco: Current User    Types: Chew  Substance Use Topics  . Alcohol use: Yes    Frequency: Never  . Drug use: No     Allergies   Other   Review of Systems Review of Systems  Skin: Positive for rash.  All other systems reviewed and are negative.    Physical Exam Updated Vital Signs BP 134/76   Pulse 85   Temp 98.3 F (36.8 C) (Oral)   Resp 18   Ht 6\' 1"  (1.854 m)   Wt 109.9 kg   SpO2 95%   BMI 31.97 kg/m   Physical Exam Vitals signs and nursing note reviewed.  Constitutional:      General: He is not in acute distress.    Appearance: He is well-developed.  HENT:     Head: Atraumatic.     Mouth/Throat:     Comments: No obvious mucosal rash appreciated Eyes:     Conjunctiva/sclera: Conjunctivae normal.  Neck:     Musculoskeletal: Neck supple.  Cardiovascular:     Rate and Rhythm: Normal rate and regular rhythm.     Pulses: Normal pulses.     Heart sounds: Normal heart sounds.  Pulmonary:     Effort:  Pulmonary effort is normal.     Breath sounds: Normal breath sounds.  Abdominal:     Palpations: Abdomen is soft.     Tenderness: There is no abdominal tenderness.  Skin:    Findings: Rash (Diffuse maculopapular erythematous rash noted throughout body with mottled skin appearance.  It is blanchable.  Multiple bulla forming to sole of feet) present.  Neurological:     Mental Status: He is alert.      ED Treatments / Results  Labs (all labs ordered are listed, but only abnormal results  are displayed) Labs Reviewed  CBC WITH DIFFERENTIAL/PLATELET - Abnormal; Notable for the following components:      Result Value   WBC 3.8 (*)    Hemoglobin 12.8 (*)    HCT 37.4 (*)    Platelets 84 (*)    Lymphs Abs 0.5 (*)    Eosinophils Absolute 1.1 (*)    All other components within normal limits  COMPREHENSIVE METABOLIC PANEL - Abnormal; Notable for the following components:   Potassium 3.0 (*)    Chloride 96 (*)    Glucose, Bld 112 (*)    Calcium 8.0 (*)    Total Protein 5.7 (*)    Albumin 3.0 (*)    Total Bilirubin 1.7 (*)    All other components within normal limits  C-REACTIVE PROTEIN - Abnormal; Notable for the following components:   CRP 7.1 (*)    All other components within normal limits  SEDIMENTATION RATE - Abnormal; Notable for the following components:   Sed Rate 26 (*)    All other components within normal limits    EKG None  ED ECG REPORT   Date: 07/30/2018  Rate: 89  Rhythm: normal sinus rhythm  QRS Axis: normal  Intervals: normal  ST/T Wave abnormalities: normal  Conduction Disutrbances:none  Narrative Interpretation:   Old EKG Reviewed: unchanged  I have personally reviewed the EKG tracing and agree with the computerized printout as noted.   Radiology No results found.  Procedures Procedures (including critical care time)  Medications Ordered in ED Medications  sodium chloride 0.9 % bolus 1,000 mL (0 mLs Intravenous Stopped 07/30/18 1219)   diphenhydrAMINE (BENADRYL) injection 25 mg (25 mg Intravenous Given 07/30/18 1039)  dexamethasone (DECADRON) injection 10 mg (10 mg Intravenous Given 07/30/18 1040)  potassium chloride SA (K-DUR) CR tablet 40 mEq (40 mEq Oral Given 07/30/18 1216)  potassium chloride 20 MEQ/15ML (10%) solution 20 mEq (20 mEq Oral Given 07/30/18 1239)     Initial Impression / Assessment and Plan / ED Course  I have reviewed the triage vital signs and the nursing notes.  Pertinent labs & imaging results that were available during my care of the patient were reviewed by me and considered in my medical decision making (see chart for details).        BP 134/76   Pulse 85   Temp 98.3 F (36.8 C) (Oral)   Resp 18   Ht 6\' 1"  (1.854 m)   Wt 109.9 kg   SpO2 95%   BMI 31.97 kg/m    Final Clinical Impressions(s) / ED Diagnoses   Final diagnoses:  Drug-induced skin rash    ED Discharge Orders         Ordered    predniSONE (DELTASONE) 20 MG tablet  Status:  Discontinued     07/30/18 1241    predniSONE (DELTASONE) 10 MG tablet     07/30/18 1244    diphenhydrAMINE (BENADRYL) 25 MG tablet  Every 6 hours PRN     07/30/18 1246         10:01 AM Patient developed extensive skin rash in the first cycle of his treatment last month.  He was seen by his oncologist Dr. Earlie Server 5/12 who is made aware of his rash and felt that is likely a reaction to [facility].  Oncologist recommended starting prednisone, however patient states he was not aware that he needs to pick up prednisone prescription therefore he has not received any treatment for that.  The prednisone taper course includes 100  mg p.o. daily for 1 week followed by 80 mg p.o. daily for 1 week followed by 60 mg p.o. daily for 1 week followed by 40 mg p.o. daily for 1 week followed by 20 mg p.o. daily for 1 weeks then 10 mg p.o. daily for 1 week.  12:34 PM Labs remarkable for potassium of 3.0 which is a mild improvement from previous.  EKG without  concerning changes.  Patient given supplementation here.  His inflammatory marker is elevated, sed rate is 26, and CRP is 7.1.  This is secondary to his rash.  At this time, patient felt an improvement with his symptoms after receiving Decadron, and Benadryl.  He feels comfortable going home.  Will prescribe prednisone taper course as previously recommended.  Encourage patient to follow-up closely with his oncologist for further care.  Return precaution discussed.   Domenic Moras, PA-C 07/30/18 1246    Lacretia Leigh, MD 08/01/18 [phone]

## 2018-07-30 NOTE — ED Triage Notes (Signed)
Pt BIB EMS from home. Patient receiving chemo treatments for lung cancer- last treatment was this past Wednesday (which was his 3rd treatment). Shortly thereafter, patient started noticing full body rash with blotchy spots thoughout (full body including feet) with itchiness. Patient called cancer doctor and was told to check into the ED to get the rash "checked out." Patient does have chest tube to right chest- last drainage of fluid was today by home health nurse. Patient denies CP/SOB.

## 2018-07-30 NOTE — ED Notes (Addendum)
Discharge instructions reviewed with patient. Patient verbalizes understanding. VSS.  Patient requests to "sleep for a few hours before I get discharged." This RN explained that the patient would be safer and probably more comfortable sleeping in his own bed. Patient states he doesn't know how he is going to get home. Patient states he "doesn't do buses. I get lost, no matter how many specific directions I get." Patient states he does not want to spend money for a cab. Patient does not want to respond regarding someone to call to drive him home. Patient ambulatory at discharge with admitted pain to his feet. Patient requests wheelchair to be wheeled to front of ED. [employer] called for patient and patient assisted into cab.

## 2018-07-30 NOTE — Telephone Encounter (Signed)
Returning VM left this am by Pamala Hurry, a nurse from [facility] on behalf of pt.  She reports that pt has worsening rash that has started to blister and cause severe swelling in his bilat legs with weeping/drainage.  Per Pamala Hurry the pt denies SOB or CP or trouble swallowing but states that he does not feel well and his skin is severely red and itchy.  Pt is unsure if he has a fever at this time.  Pt told [facility] nurse that he did not pick up his prednisone prescribed to him last week for the previously present rash b/c he was not aware that it had been sent to the pharmacy.  Pt was given benadryl cream by [facility] nurse without any improvement.  Pamala Hurry states that a rep from the [place] is also present and that neither she nor the [place] can transport him at this time to [facility] for a [facility] appt, and that the pt is not capable of driving at this time.  Advised HH nurse to call EMS and have pt transported to ED d/t the patient's worsening condition.  Barbara VU of plan, states she will discuss it with the pt and call back as needed.

## 2018-07-30 NOTE — Discharge Instructions (Signed)
Your rash is likely due to recent chemo medication. Per recommendation of your oncologist Dr. Julien Nordmann, please take prednisone as prescribed.  Call and follow up closely with your doctor for further care.  Take benadryl as needed for itch.  Return if your condition worsen or if you have other concerns.

## 2018-07-30 NOTE — ED Notes (Signed)
Pt had difficult time swallowing PO potassium tablets. Pt was able to take 20 meq of potassium. PA made aware. This RN asked if PA could switch remaining 20 meq dose to oral suspension

## 2018-07-30 NOTE — Progress Notes (Signed)
Oncology Nurse Navigator Documentation  Oncology Nurse Navigator Flowsheets 07/30/2018  Navigator Location [facility]  Referral date to RadOnc/MedOnc -  Navigator Encounter Type Letter/Fax/Email;Other/I updated Dr. Julien Nordmann that pateint was in the ED with rash and weakness.  He states Daniel Bryant treatment plan has changed and he will not be getting Keytruda next cycle. I updated [facility] case manager on his admission and update on treatment plan.  I also sent her an updated treatment schedule.  I will send Daniel Bryant a print out of his treatment schedule.    Telephone -  Abnormal Finding Date -  Confirmed Diagnosis Date -  Multidisiplinary Clinic Date -  Treatment Initiated Date -  Patient Visit Type -  Treatment Phase Treatment  Barriers/Navigation Needs Education;Coordination of Care  Education Other  Interventions Coordination of Care;Education  Coordination of Care Other  Education Method Written  Acuity Level 3  Acuity Level 2 -  Time Spent with Patient 82

## 2018-07-30 NOTE — Progress Notes (Signed)
Oncology Nurse Navigator Documentation  Oncology Nurse Navigator Flowsheets 07/30/2018  Navigator Location [facility]  Referral date to RadOnc/MedOnc -  Navigator Encounter Type Other/I followed up on Daniel Bryant schedule.  It looks like he went to ED for rash and weakness.  I called [facility] to update and will reach out to patient to help him understand how to take his pre medication for his chemo.    Telephone -  Abnormal Finding Date -  Confirmed Diagnosis Date -  Multidisiplinary Clinic Date -  Treatment Initiated Date -  Patient Visit Type -  Treatment Phase Treatment  Barriers/Navigation Needs Coordination of Care  Education -  Interventions Coordination of Care  Coordination of Care Other  Education Method -  Acuity Level 1  Acuity Level 2 -  Time Spent with Patient 30

## 2018-07-31 ENCOUNTER — Inpatient Hospital Stay: Payer: No Typology Code available for payment source

## 2018-07-31 ENCOUNTER — Encounter: Payer: Self-pay | Admitting: *Deleted

## 2018-07-31 ENCOUNTER — Other Ambulatory Visit: Payer: Self-pay

## 2018-07-31 DIAGNOSIS — E876 Hypokalemia: Secondary | ICD-10-CM | POA: Diagnosis not present

## 2018-07-31 DIAGNOSIS — Z79899 Other long term (current) drug therapy: Secondary | ICD-10-CM | POA: Diagnosis not present

## 2018-07-31 DIAGNOSIS — R197 Diarrhea, unspecified: Secondary | ICD-10-CM | POA: Diagnosis not present

## 2018-07-31 DIAGNOSIS — R110 Nausea: Secondary | ICD-10-CM | POA: Diagnosis not present

## 2018-07-31 DIAGNOSIS — J90 Pleural effusion, not elsewhere classified: Secondary | ICD-10-CM | POA: Diagnosis not present

## 2018-07-31 DIAGNOSIS — C3491 Malignant neoplasm of unspecified part of right bronchus or lung: Secondary | ICD-10-CM

## 2018-07-31 DIAGNOSIS — C3411 Malignant neoplasm of upper lobe, right bronchus or lung: Secondary | ICD-10-CM | POA: Diagnosis present

## 2018-07-31 DIAGNOSIS — R21 Rash and other nonspecific skin eruption: Secondary | ICD-10-CM | POA: Diagnosis not present

## 2018-07-31 DIAGNOSIS — Z5111 Encounter for antineoplastic chemotherapy: Secondary | ICD-10-CM | POA: Diagnosis present

## 2018-07-31 LAB — CBC WITH DIFFERENTIAL (CANCER CENTER ONLY)
Abs Immature Granulocytes: 0.03 10*3/uL (ref 0.00–0.07)
Basophils Absolute: 0 10*3/uL (ref 0.0–0.1)
Basophils Relative: 0 %
Eosinophils Absolute: 0.6 10*3/uL — ABNORMAL HIGH (ref 0.0–0.5)
Eosinophils Relative: 16 %
HCT: 34.6 % — ABNORMAL LOW (ref 39.0–52.0)
Hemoglobin: 12 g/dL — ABNORMAL LOW (ref 13.0–17.0)
Immature Granulocytes: 1 %
Lymphocytes Relative: 20 %
Lymphs Abs: 0.8 10*3/uL (ref 0.7–4.0)
MCH: 29.4 pg (ref 26.0–34.0)
MCHC: 34.7 g/dL (ref 30.0–36.0)
MCV: 84.8 fL (ref 80.0–100.0)
Monocytes Absolute: 0.1 10*3/uL (ref 0.1–1.0)
Monocytes Relative: 2 %
Neutro Abs: 2.5 10*3/uL (ref 1.7–7.7)
Neutrophils Relative %: 61 %
Platelet Count: 64 10*3/uL — ABNORMAL LOW (ref 150–400)
RBC: 4.08 MIL/uL — ABNORMAL LOW (ref 4.22–5.81)
RDW: 13.9 % (ref 11.5–15.5)
WBC Count: 4 10*3/uL (ref 4.0–10.5)
nRBC: 0 % (ref 0.0–0.2)

## 2018-07-31 LAB — CMP (CANCER CENTER ONLY)
ALT: 42 U/L (ref 0–44)
AST: 50 U/L — ABNORMAL HIGH (ref 15–41)
Albumin: 3.1 g/dL — ABNORMAL LOW (ref 3.5–5.0)
Alkaline Phosphatase: 96 U/L (ref 38–126)
Anion gap: 11 (ref 5–15)
BUN: 15 mg/dL (ref 6–20)
CO2: 28 mmol/L (ref 22–32)
Calcium: 8.3 mg/dL — ABNORMAL LOW (ref 8.9–10.3)
Chloride: 101 mmol/L (ref 98–111)
Creatinine: 0.83 mg/dL (ref 0.61–1.24)
GFR, Est AFR Am: >60 mL/min (ref >60)
GFR, Estimated: >60 mL/min (ref >60)
Glucose, Bld: 97 mg/dL (ref 70–99)
Potassium: 3.2 mmol/L — ABNORMAL LOW (ref 3.5–5.1)
Sodium: 140 mmol/L (ref 135–145)
Total Bilirubin: 0.6 mg/dL (ref 0.3–1.2)
Total Protein: 6.3 g/dL — ABNORMAL LOW (ref 6.5–8.1)

## 2018-07-31 NOTE — Progress Notes (Signed)
I called and updated [facility] nurse coordinator on recent ED admission and plan of care.

## 2018-08-02 ENCOUNTER — Telehealth: Payer: Self-pay | Admitting: *Deleted

## 2018-08-02 NOTE — Telephone Encounter (Signed)
TCT patient regarding lab results from 07/31/18. Spoke with patient and reviewed labs-specifically his K+  Level was 3.2.  Encouraged to increase amount of K+ rich foods. Reviewed food groups with him. He voiced understanding. No further questions or concerns.

## 2018-08-02 NOTE — Telephone Encounter (Signed)
-----   Message from Curt Bears, MD sent at 07/31/2018  3:17 PM EDT ----- Please encourage him to increase his potassium rich diet. ----- Message ----- From: Buel Ream, [facility] [place] Sent: 07/31/2018   2:44 PM EDT To: Curt Bears, MD

## 2018-08-03 ENCOUNTER — Telehealth: Payer: Self-pay | Admitting: Medical Oncology

## 2018-08-03 NOTE — Telephone Encounter (Signed)
Rash and Passing blood clots-Barbara, Maxim RN update- She is on the way to see pt . He reported to her he did not get medrol dose pack for rash from last ED visit and that he was passing blood clots in his stool today. I told her to call me when she gets to pt house and cal with assessment.  hgb 12 three days ago. Update given to [facility] and Barbara instructed to call back and ask for Learta Codding in [facility].

## 2018-08-07 ENCOUNTER — Inpatient Hospital Stay (HOSPITAL_COMMUNITY)
Admission: EM | Admit: 2018-08-07 | Discharge: 2018-08-10 | DRG: 393 | Disposition: A | Discharge status: Home Health | Payer: No Typology Code available for payment source | Attending: Family Medicine | Admitting: Family Medicine

## 2018-08-07 ENCOUNTER — Emergency Department (HOSPITAL_COMMUNITY): Payer: No Typology Code available for payment source

## 2018-08-07 ENCOUNTER — Inpatient Hospital Stay: Payer: No Typology Code available for payment source

## 2018-08-07 ENCOUNTER — Telehealth: Payer: Self-pay | Admitting: *Deleted

## 2018-08-07 ENCOUNTER — Other Ambulatory Visit: Payer: Self-pay

## 2018-08-07 ENCOUNTER — Encounter (HOSPITAL_COMMUNITY): Payer: Self-pay

## 2018-08-07 DIAGNOSIS — Z720 Tobacco use: Secondary | ICD-10-CM | POA: Diagnosis not present

## 2018-08-07 DIAGNOSIS — E785 Hyperlipidemia, unspecified: Secondary | ICD-10-CM | POA: Diagnosis present

## 2018-08-07 DIAGNOSIS — R5081 Fever presenting with conditions classified elsewhere: Secondary | ICD-10-CM | POA: Diagnosis present

## 2018-08-07 DIAGNOSIS — C3491 Malignant neoplasm of unspecified part of right bronchus or lung: Secondary | ICD-10-CM | POA: Diagnosis present

## 2018-08-07 DIAGNOSIS — K37 Unspecified appendicitis: Secondary | ICD-10-CM | POA: Diagnosis present

## 2018-08-07 DIAGNOSIS — J910 Malignant pleural effusion: Secondary | ICD-10-CM | POA: Diagnosis not present

## 2018-08-07 DIAGNOSIS — S90821A Blister (nonthermal), right foot, initial encounter: Secondary | ICD-10-CM | POA: Diagnosis present

## 2018-08-07 DIAGNOSIS — Z7982 Long term (current) use of aspirin: Secondary | ICD-10-CM | POA: Diagnosis not present

## 2018-08-07 DIAGNOSIS — Z6831 Body mass index (BMI) 31.0-31.9, adult: Secondary | ICD-10-CM

## 2018-08-07 DIAGNOSIS — K5000 Crohn's disease of small intestine without complications: Secondary | ICD-10-CM | POA: Diagnosis present

## 2018-08-07 DIAGNOSIS — L270 Generalized skin eruption due to drugs and medicaments taken internally: Secondary | ICD-10-CM | POA: Diagnosis present

## 2018-08-07 DIAGNOSIS — J449 Chronic obstructive pulmonary disease, unspecified: Secondary | ICD-10-CM | POA: Diagnosis present

## 2018-08-07 DIAGNOSIS — Z809 Family history of malignant neoplasm, unspecified: Secondary | ICD-10-CM

## 2018-08-07 DIAGNOSIS — F4310 Post-traumatic stress disorder, unspecified: Secondary | ICD-10-CM | POA: Diagnosis present

## 2018-08-07 DIAGNOSIS — J90 Pleural effusion, not elsewhere classified: Secondary | ICD-10-CM | POA: Diagnosis present

## 2018-08-07 DIAGNOSIS — Z888 Allergy status to other drugs, medicaments and biological substances status: Secondary | ICD-10-CM

## 2018-08-07 DIAGNOSIS — K5289 Other specified noninfective gastroenteritis and colitis: Secondary | ICD-10-CM | POA: Diagnosis not present

## 2018-08-07 DIAGNOSIS — Z713 Dietary counseling and surveillance: Secondary | ICD-10-CM

## 2018-08-07 DIAGNOSIS — K36 Other appendicitis: Secondary | ICD-10-CM

## 2018-08-07 DIAGNOSIS — Z1159 Encounter for screening for other viral diseases: Secondary | ICD-10-CM

## 2018-08-07 DIAGNOSIS — G4733 Obstructive sleep apnea (adult) (pediatric): Secondary | ICD-10-CM | POA: Diagnosis present

## 2018-08-07 DIAGNOSIS — D709 Neutropenia, unspecified: Secondary | ICD-10-CM | POA: Diagnosis present

## 2018-08-07 DIAGNOSIS — E7800 Pure hypercholesterolemia, unspecified: Secondary | ICD-10-CM | POA: Diagnosis present

## 2018-08-07 DIAGNOSIS — E860 Dehydration: Secondary | ICD-10-CM | POA: Diagnosis present

## 2018-08-07 DIAGNOSIS — C781 Secondary malignant neoplasm of mediastinum: Secondary | ICD-10-CM | POA: Diagnosis present

## 2018-08-07 DIAGNOSIS — D61810 Antineoplastic chemotherapy induced pancytopenia: Secondary | ICD-10-CM | POA: Diagnosis present

## 2018-08-07 DIAGNOSIS — C799 Secondary malignant neoplasm of unspecified site: Secondary | ICD-10-CM | POA: Diagnosis not present

## 2018-08-07 DIAGNOSIS — I495 Sick sinus syndrome: Secondary | ICD-10-CM | POA: Diagnosis present

## 2018-08-07 DIAGNOSIS — E876 Hypokalemia: Secondary | ICD-10-CM | POA: Diagnosis present

## 2018-08-07 DIAGNOSIS — E871 Hypo-osmolality and hyponatremia: Secondary | ICD-10-CM | POA: Diagnosis present

## 2018-08-07 DIAGNOSIS — K5530 Necrotizing enterocolitis, unspecified: Principal | ICD-10-CM | POA: Diagnosis present

## 2018-08-07 DIAGNOSIS — Z950 Presence of cardiac pacemaker: Secondary | ICD-10-CM | POA: Diagnosis present

## 2018-08-07 DIAGNOSIS — Z79899 Other long term (current) drug therapy: Secondary | ICD-10-CM

## 2018-08-07 DIAGNOSIS — D701 Agranulocytosis secondary to cancer chemotherapy: Secondary | ICD-10-CM | POA: Diagnosis present

## 2018-08-07 DIAGNOSIS — I10 Essential (primary) hypertension: Secondary | ICD-10-CM | POA: Diagnosis not present

## 2018-08-07 DIAGNOSIS — T451X5A Adverse effect of antineoplastic and immunosuppressive drugs, initial encounter: Secondary | ICD-10-CM | POA: Diagnosis present

## 2018-08-07 DIAGNOSIS — K219 Gastro-esophageal reflux disease without esophagitis: Secondary | ICD-10-CM | POA: Diagnosis present

## 2018-08-07 DIAGNOSIS — Z9221 Personal history of antineoplastic chemotherapy: Secondary | ICD-10-CM

## 2018-08-07 LAB — CBC WITH DIFFERENTIAL/PLATELET
Abs Immature Granulocytes: 0.02 10*3/uL (ref 0.00–0.07)
Basophils Absolute: 0 10*3/uL (ref 0.0–0.1)
Basophils Relative: 0 %
Eosinophils Absolute: 0 10*3/uL (ref 0.0–0.5)
Eosinophils Relative: 1 %
HCT: 29.6 % — ABNORMAL LOW (ref 39.0–52.0)
Hemoglobin: 10.3 g/dL — ABNORMAL LOW (ref 13.0–17.0)
Immature Granulocytes: 2 %
Lymphocytes Relative: 61 %
Lymphs Abs: 0.6 10*3/uL — ABNORMAL LOW (ref 0.7–4.0)
MCH: 29.3 pg (ref 26.0–34.0)
MCHC: 34.8 g/dL (ref 30.0–36.0)
MCV: 84.3 fL (ref 80.0–100.0)
Monocytes Absolute: 0.2 10*3/uL (ref 0.1–1.0)
Monocytes Relative: 22 %
Neutro Abs: 0.1 10*3/uL — ABNORMAL LOW (ref 1.7–7.7)
Neutrophils Relative %: 14 %
Platelets: 65 10*3/uL — ABNORMAL LOW (ref 150–400)
RBC: 3.51 MIL/uL — ABNORMAL LOW (ref 4.22–5.81)
RDW: 13.2 % (ref 11.5–15.5)
WBC: 1 10*3/uL — CL (ref 4.0–10.5)
nRBC: 0 % (ref 0.0–0.2)

## 2018-08-07 LAB — COMPREHENSIVE METABOLIC PANEL
ALT: 25 U/L (ref 0–44)
AST: 25 U/L (ref 15–41)
Albumin: 3 g/dL — ABNORMAL LOW (ref 3.5–5.0)
Alkaline Phosphatase: 87 U/L (ref 38–126)
Anion gap: 15 (ref 5–15)
BUN: 14 mg/dL (ref 6–20)
CO2: 26 mmol/L (ref 22–32)
Calcium: 7.8 mg/dL — ABNORMAL LOW (ref 8.9–10.3)
Chloride: 92 mmol/L — ABNORMAL LOW (ref 98–111)
Creatinine, Ser: 0.92 mg/dL (ref 0.61–1.24)
GFR calc Af Amer: >60 mL/min (ref >60)
GFR calc non Af Amer: >60 mL/min (ref >60)
Glucose, Bld: 95 mg/dL (ref 70–99)
Potassium: 2.5 mmol/L — CL (ref 3.5–5.1)
Sodium: 133 mmol/L — ABNORMAL LOW (ref 135–145)
Total Bilirubin: 1.2 mg/dL (ref 0.3–1.2)
Total Protein: 6.6 g/dL (ref 6.5–8.1)

## 2018-08-07 LAB — SARS CORONAVIRUS 2 BY RT PCR (HOSPITAL ORDER, PERFORMED IN ~~LOC~~ HOSPITAL LAB): SARS Coronavirus 2: NEGATIVE

## 2018-08-07 LAB — LIPASE, BLOOD: Lipase: 29 U/L (ref 11–51)

## 2018-08-07 LAB — TSH: TSH: 1.331 u[IU]/mL (ref 0.350–4.500)

## 2018-08-07 MED ORDER — POLYETHYLENE GLYCOL 3350 17 G PO PACK: 17.0000 g | PACK | Freq: Every day | ORAL | Status: DC | PRN

## 2018-08-07 MED ORDER — ASPIRIN EC 81 MG PO TBEC
81.0000 mg | DELAYED_RELEASE_TABLET | Freq: Every day | ORAL | Status: DC
  Administered 2018-08-08 – 2018-08-10 (×3): 81 mg via ORAL
  Filled 2018-08-07 (×3): qty 1

## 2018-08-07 MED ORDER — DONEPEZIL HCL 5 MG PO TABS
5.0000 mg | ORAL_TABLET | Freq: Every day | ORAL | Status: DC
  Administered 2018-08-07 – 2018-08-09 (×3): 5 mg via ORAL
  Filled 2018-08-07 (×3): qty 1

## 2018-08-07 MED ORDER — HYDROMORPHONE HCL 1 MG/ML IJ SOLN
1.0000 mg | Freq: Once | INTRAMUSCULAR | Status: AC
  Administered 2018-08-07: 1 mg via INTRAVENOUS
  Filled 2018-08-07: qty 1

## 2018-08-07 MED ORDER — TBO-FILGRASTIM 480 MCG/0.8ML ~~LOC~~ SOSY
480.0000 ug | PREFILLED_SYRINGE | Freq: Once | SUBCUTANEOUS | Status: AC
  Administered 2018-08-07: 480 ug via SUBCUTANEOUS
  Filled 2018-08-07: qty 0.8

## 2018-08-07 MED ORDER — VITAMIN D3 25 MCG (1000 UNIT) PO TABS
1000.0000 [IU] | ORAL_TABLET | Freq: Every day | ORAL | Status: DC
  Administered 2018-08-08 – 2018-08-10 (×3): 1000 [IU] via ORAL
  Filled 2018-08-07 (×3): qty 1

## 2018-08-07 MED ORDER — OXYCODONE HCL 5 MG PO TABS
5.0000 mg | ORAL_TABLET | ORAL | Status: DC | PRN
  Administered 2018-08-07 – 2018-08-08 (×3): 5 mg via ORAL
  Filled 2018-08-07 (×3): qty 1

## 2018-08-07 MED ORDER — PANTOPRAZOLE SODIUM 40 MG PO TBEC
40.0000 mg | DELAYED_RELEASE_TABLET | Freq: Every day | ORAL | Status: DC
  Administered 2018-08-08 – 2018-08-10 (×3): 40 mg via ORAL
  Filled 2018-08-07 (×3): qty 1

## 2018-08-07 MED ORDER — POTASSIUM CHLORIDE CRYS ER 20 MEQ PO TBCR
40.0000 meq | EXTENDED_RELEASE_TABLET | Freq: Once | ORAL | Status: AC
  Administered 2018-08-07: 40 meq via ORAL
  Filled 2018-08-07: qty 2

## 2018-08-07 MED ORDER — VANCOMYCIN HCL IN DEXTROSE 1-5 GM/200ML-% IV SOLN
1000.0000 mg | Freq: Two times a day (BID) | INTRAVENOUS | Status: DC
  Administered 2018-08-08 – 2018-08-09 (×3): 1000 mg via INTRAVENOUS
  Filled 2018-08-07 (×2): qty 200

## 2018-08-07 MED ORDER — ACETAMINOPHEN 325 MG PO TABS: 650.0000 mg | ORAL_TABLET | Freq: Four times a day (QID) | ORAL | Status: DC | PRN

## 2018-08-07 MED ORDER — IOHEXOL 300 MG/ML  SOLN
100.0000 mL | Freq: Once | INTRAMUSCULAR | Status: AC | PRN
  Administered 2018-08-07: 100 mL via INTRAVENOUS

## 2018-08-07 MED ORDER — POTASSIUM CHLORIDE 10 MEQ/100ML IV SOLN
10.0000 meq | INTRAVENOUS | Status: AC
  Administered 2018-08-07 (×2): 10 meq via INTRAVENOUS
  Filled 2018-08-07 (×2): qty 100

## 2018-08-07 MED ORDER — ONDANSETRON HCL 4 MG PO TABS
4.0000 mg | ORAL_TABLET | Freq: Four times a day (QID) | ORAL | Status: DC | PRN
  Administered 2018-08-07 – 2018-08-10 (×2): 4 mg via ORAL
  Filled 2018-08-07 (×2): qty 1

## 2018-08-07 MED ORDER — POTASSIUM CHLORIDE IN NACL 40-0.9 MEQ/L-% IV SOLN
INTRAVENOUS | Status: DC
  Administered 2018-08-07 – 2018-08-09 (×5): 100 mL/h via INTRAVENOUS
  Filled 2018-08-07 (×6): qty 1000

## 2018-08-07 MED ORDER — ACETAMINOPHEN 650 MG RE SUPP: 650.0000 mg | Freq: Four times a day (QID) | RECTAL | Status: DC | PRN

## 2018-08-07 MED ORDER — FOLIC ACID 1 MG PO TABS
1.0000 mg | ORAL_TABLET | Freq: Every day | ORAL | Status: DC
  Administered 2018-08-08 – 2018-08-10 (×3): 1 mg via ORAL
  Filled 2018-08-07 (×3): qty 1

## 2018-08-07 MED ORDER — ONDANSETRON HCL 4 MG/2ML IJ SOLN
4.0000 mg | Freq: Once | INTRAMUSCULAR | Status: AC
  Administered 2018-08-07: 4 mg via INTRAVENOUS
  Filled 2018-08-07: qty 2

## 2018-08-07 MED ORDER — SODIUM CHLORIDE 0.9 % IV BOLUS
1000.0000 mL | Freq: Once | INTRAVENOUS | Status: AC
  Administered 2018-08-07: 1000 mL via INTRAVENOUS

## 2018-08-07 MED ORDER — ATORVASTATIN CALCIUM 40 MG PO TABS
40.0000 mg | ORAL_TABLET | Freq: Every day | ORAL | Status: DC
  Administered 2018-08-07 – 2018-08-09 (×3): 40 mg via ORAL
  Filled 2018-08-07 (×3): qty 1

## 2018-08-07 MED ORDER — ALBUTEROL SULFATE (2.5 MG/3ML) 0.083% IN NEBU: 2.5000 mg | INHALATION_SOLUTION | Freq: Two times a day (BID) | RESPIRATORY_TRACT | Status: DC | PRN

## 2018-08-07 MED ORDER — VANCOMYCIN HCL IN DEXTROSE 1-5 GM/200ML-% IV SOLN: 1000.0000 mg | Freq: Once | INTRAVENOUS | Status: DC

## 2018-08-07 MED ORDER — SODIUM CHLORIDE 0.9 % IV SOLN
2.0000 g | Freq: Three times a day (TID) | INTRAVENOUS | Status: DC
  Administered 2018-08-07 – 2018-08-09 (×5): 2 g via INTRAVENOUS
  Filled 2018-08-07 (×6): qty 2

## 2018-08-07 MED ORDER — DIPHENHYDRAMINE HCL 25 MG PO CAPS
25.0000 mg | ORAL_CAPSULE | Freq: Four times a day (QID) | ORAL | Status: DC | PRN
  Administered 2018-08-08: 25 mg via ORAL
  Filled 2018-08-07 (×3): qty 1

## 2018-08-07 MED ORDER — PROCHLORPERAZINE MALEATE 10 MG PO TABS: 10.0000 mg | ORAL_TABLET | Freq: Four times a day (QID) | ORAL | Status: DC | PRN

## 2018-08-07 MED ORDER — VANCOMYCIN HCL 10 G IV SOLR
2000.0000 mg | Freq: Once | INTRAVENOUS | Status: AC
  Administered 2018-08-07: 2000 mg via INTRAVENOUS
  Filled 2018-08-07: qty 2000

## 2018-08-07 MED ORDER — ONDANSETRON HCL 4 MG/2ML IJ SOLN
4.0000 mg | Freq: Four times a day (QID) | INTRAMUSCULAR | Status: DC | PRN
  Administered 2018-08-09: 4 mg via INTRAVENOUS
  Filled 2018-08-07: qty 2

## 2018-08-07 MED ORDER — SODIUM CHLORIDE (PF) 0.9 % IJ SOLN
INTRAMUSCULAR | Status: AC
  Filled 2018-08-07: qty 50

## 2018-08-07 MED ORDER — CYANOCOBALAMIN 500 MCG PO TABS
500.0000 ug | ORAL_TABLET | Freq: Every day | ORAL | Status: DC
  Administered 2018-08-08 – 2018-08-10 (×3): 500 ug via ORAL
  Filled 2018-08-07 (×3): qty 1

## 2018-08-07 MED ORDER — ESCITALOPRAM OXALATE 20 MG PO TABS
20.0000 mg | ORAL_TABLET | Freq: Every day | ORAL | Status: DC
  Administered 2018-08-08 – 2018-08-10 (×3): 20 mg via ORAL
  Filled 2018-08-07 (×3): qty 1

## 2018-08-07 MED ORDER — SODIUM CHLORIDE 0.9 % IV SOLN: 2.0000 g | Freq: Once | INTRAVENOUS | Status: DC

## 2018-08-07 MED ORDER — POTASSIUM CHLORIDE 10 MEQ/100ML IV SOLN
10.0000 meq | INTRAVENOUS | Status: AC
  Administered 2018-08-07 (×2): 10 meq via INTRAVENOUS
  Filled 2018-08-07 (×2): qty 100

## 2018-08-07 MED ORDER — SODIUM CHLORIDE 0.9 % IV SOLN
2.0000 g | Freq: Once | INTRAVENOUS | Status: AC
  Administered 2018-08-07: 2 g via INTRAVENOUS
  Filled 2018-08-07: qty 2

## 2018-08-07 MED ORDER — BISACODYL 10 MG RE SUPP: 10.0000 mg | Freq: Every day | RECTAL | Status: DC | PRN

## 2018-08-07 NOTE — ED Triage Notes (Signed)
Pt BIBA from home. Pt has had weakness, nausea, and diarrhea x 1 week, which has gotten worse the last 24 hours. Pt c/o bloody diarrhea intermittently. Pt has hx of lung ca. Pt has chest tube for pleural fluid, emptied today. VSS.

## 2018-08-07 NOTE — Progress Notes (Signed)
Pharmacy Antibiotic Note  Shoji Pertuit is a 58 y.o. male with lung cancer currently undergoing chemotherapy treatment (had chemo on 07/24/18), presented to the ED on 5/26 with c/o weakness and n/v. He was found to be neutropenic and hypokalemic. To start broad abx with vancomycin and cefepime for empiric coverage.   - scr 0.92 (crcl~100) - ANC 0.1  Plan: - vancomycin 2000 mg IV x1, then 1000 mg IV q12h for est AUC 468 - cefepime 2gm IV q8h per MD  _____________________________________  Height: 6' (182.9 cm) Weight: 230 lb (104.3 kg) IBW/kg (Calculated) : 77.6  Temp (24hrs), Avg:99.6 F (37.6 C), Min:99.6 F (37.6 C), Max:99.6 F (37.6 C)  Recent Labs  Lab 08/07/18 1148  WBC 1.0*  CREATININE 0.92    Estimated Creatinine Clearance: 106.6 mL/min (by C-G formula based on SCr of 0.92 mg/dL).    Allergies  Allergen Reactions  . Other Other (See Comments)    Patient states "I had a scrotal procedure a couple of years ago, there was something they gave me to relax me and I started freaking out and seeing things".  Unable to ascertain from pt or his records what medication was.     Thank you for allowing pharmacy to be a part of this patient's care.  Lynelle Doctor 08/07/2018 4:18 PM

## 2018-08-07 NOTE — Progress Notes (Signed)
A consult was received from an ED physician for vancomycin and cefepime per pharmacy dosing (for an indication other than meningitis). The patient's profile has been reviewed for ht/wt/allergies/indication/available labs. A one time order has been placed for the above antibiotics.  Further antibiotics/pharmacy consults should be ordered by admitting physician if indicated.                       Reuel Boom, PharmD, BCPS [phone] 08/07/2018, 1:21 PM

## 2018-08-07 NOTE — H&P (Signed)
History and Physical    Daniel Bryant BJS:[id number] DOB: [birth year] DOA: 08/07/2018  PCP: [facility]   Patient coming from: Home  Chief Complaint: Weakness, nausea, vomiting, diarrhea, abdominal pain  HPI: Daniel Bryant is a 58 y.o. 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  Of note patient is unreliable historian given his memory impairment but did answer some questions appropriately.  That "chemotherapy has messed him up and he is forgetful.  He states that [place] lives in a [place] community and started having some this over the last 4 to 5 days associated with nausea, diarrhea and noted that there is blood in his diarrhea but does not know if it is still or not.  He states that he had some chills but did not know if he has had a fever or not and remained weak and dehydrated.  Complaining of some whole body pain but there is also having some nausea, vomiting, as well as diarrhea that lasted over a week and got worse last 24 hours.  Pleural fluid was emptied today by his home health nurse who evaluated him and advised him to go to the emergency room for further evaluation recommendations.  Upon arrival to the emergency room he is found to be severely neutropenic along with pancytopenic and had a potassium level 2.5.  Patient was complaining of abdominal pain so CT of the  abdomen pelvis was done which showed terminal ileitis.  I discussed the case with gastroenterology who recommends that this is typhlitis in the setting of his neutropenia.  I consulted medical oncology and they will see the patient tomorrow.  [facility] was asked to admit this patient for the above symptoms and he was admitted as an inpatient to the telemetry floor.  ED Course: Patient had basic blood work done along with a CT of the abdomen and pelvis and chest x-ray.  COVID testing was negative.  Patient was given a liter of normal saline bolus along with potassium supplementation.  He was started on broad-spectrum antibiotics with IV vancomycin and IV cefepime which had been continued.  Review of Systems: As per HPI otherwise 10 point review of systems negative.   Past Medical History:  Diagnosis Date   Asthma    Cancer ([place])    COPD (chronic obstructive pulmonary disease) (HCC)    GERD (gastroesophageal reflux disease)    High cholesterol    History of petit-mal seizures    Hyperlipidemia    Hypertension    Memory impairment    OSA (obstructive sleep apnea)    Does not tolerate CPAP   Pacemaker    PTSD (post-traumatic stress disorder)    Sick sinus syndrome [facility])    Past Surgical History:  Procedure Laterality Date   CARDIAC SURGERY     CHEST TUBE INSERTION Right 05/16/2018   Procedure: INSERTION PLEURAL DRAINAGE CATHETER;  Surgeon: Ivin Poot, MD;  Location: [facility];  Service: Thoracic;  Laterality: Right;   CHEST TUBE INSERTION Right  05/16/2018   Procedure: Chest Tube Insertion;  Surgeon: Ivin Poot, MD;  Location: [place];  Service: Thoracic;  Laterality: Right;   PACEMAKER INSERTION     SOCIAL HISTORY   reports that he has quit smoking. His smoking use included e-cigarettes. His smokeless tobacco use includes chew. He reports current alcohol use. He reports that he does not use drugs.  Allergies  Allergen Reactions   Other Other (See Comments)    Patient  states "I had a scrotal procedure a couple of years ago, there was something they gave me to relax me and I started freaking out and seeing things".  Unable to ascertain from pt or his records what medication was.    Family History  Problem Relation Age of Onset   Alzheimer's disease Mother    Cancer Mother    Memory loss Father    Prior to Admission medications   Medication Sig Start Date End Date Taking? Authorizing Provider  albuterol (PROVENTIL) (2.5 MG/3ML) 0.083% nebulizer solution Take 3 mLs (2.5 mg total) by nebulization 2 (two) times daily as needed for wheezing or shortness of breath. 04/09/18   Kayleen Memos, DO  aspirin EC 81 MG EC tablet Take 1 tablet (81 mg total) by mouth daily. 05/22/18   Antony Odea, PA-C  atorvastatin (LIPITOR) 40 MG tablet Take 40 mg by mouth at bedtime.    [provider]  Cholecalciferol (VITAMIN D3) 25 MCG (1000 UT) CAPS Take 1,000 Units by mouth daily.    [provider]  diphenhydrAMINE (BENADRYL) 25 MG tablet Take 1 tablet (25 mg total) by mouth every 6 (six) hours as needed for itching or allergies. 07/30/18   Domenic Moras, PA-C  donepezil (ARICEPT) 5 MG tablet Take 5 mg by mouth at bedtime.    [provider]  escitalopram (LEXAPRO) 20 MG tablet Take 20 mg by mouth daily.     [provider]  folic acid (FOLVITE) 1 MG tablet Take 1 tablet (1 mg total) by mouth daily. 06/14/18   Curt Bears, MD  lidocaine (XYLOCAINE) 2 % solution Use as directed 5 mLs in the mouth or throat every 3 (three) hours as needed for mouth pain. Gargle and spit Patient not taking: Reported on 07/24/2018 07/04/18   [name] E., PA-C  lisinopril (PRINIVIL,ZESTRIL) 5 MG tablet Take 5 mg by mouth daily.     [provider]  pantoprazole (PROTONIX) 40 MG tablet Take 1 tablet (40 mg total) by mouth daily. 04/10/18   Kayleen Memos, DO  predniSONE (DELTASONE) 10 MG tablet Take 100 mg p.o. daily for 1 week followed by 80 mg  p.o. daily for 1 week followed by 60 mg p.o. daily for 1 week followed by 40 mg p.o. daily for 1 week followed by 20 mg p.o. daily for 1 weeks then 10 mg p.o. daily for 1 week. 07/30/18   Domenic Moras, PA-C  prochlorperazine (COMPAZINE) 10 MG tablet Take 1 tablet (10 mg total) by mouth every 6 (six) hours as needed for nausea or vomiting. 07/24/18   Curt Bears, MD  vitamin B-12 (CYANOCOBALAMIN) 500 MCG tablet Take 500 mcg by mouth daily.    [provider]   Physical Exam: Vitals:   08/07/18 1400 08/07/18 1447 08/07/18 1500 08/07/18 1530  BP: 130/72 (!) 131/59 135/88 135/84  Pulse: 96 95 100 89  Resp: 18 18  18   Temp:      TempSrc:      SpO2: 94% 94%  92% 94%  Weight:      Height:       Constitutional: WN/WD obese chronically ill appearing Caucaisan male NAD and appears calm but uncomfortable Eyes:  Lids and conjunctivae normal, sclerae anicteric  ENMT: External Ears, Nose appear normal. Grossly normal hearing. Mucous membranes are dry Neck: Appears normal, supple, no cervical masses, normal ROM, no appreciable thyromegaly; no JVD Respiratory: Diminished o auscultation bilaterally, no wheezing, rales, rhonchi or crackles. Normal respiratory effort and patient is not tachypenic. No accessory muscle use. Has a Right Pleur-X Catheter in place Cardiovascular: RRR, no murmurs / rubs / gallops. S1 and S2 auscultated. 1+ LE extremity edema.  Abdomen: Soft, Tender to palpate, Distended due to body habitus. No masses palpated. No appreciable hepatosplenomegaly. Bowel sounds positive.  GU: Deferred. Musculoskeletal: No clubbing / cyanosis of digits/nails. No joint deformity upper and lower extremities.  Skin: Has a significant whole body drug induced rash with blistering on the bottom right foot. Has scratch marks and erythema and scaliness  Neurologic: CN 2-12 grossly intact with no focal deficits.  Romberg sign cerebellar reflexes not assessed.  Psychiatric: Normal judgment and insight.  Alert and oriented x 3 but is forgetful. Slightly mood and appropriate affect.   Labs on Admission: I have personally reviewed following labs and imaging studies  CBC: Recent Labs  Lab 08/07/18 1148  WBC 1.0*  NEUTROABS 0.1*  HGB 10.3*  HCT 29.6*  MCV 84.3  PLT 65*   Basic Metabolic Panel: Recent Labs  Lab 08/07/18 1148  NA 133*  K 2.5*  CL 92*  CO2 26  GLUCOSE 95  BUN 14  CREATININE 0.92  CALCIUM 7.8*   GFR: Estimated Creatinine Clearance: 106.6 mL/min (by C-G formula based on SCr of 0.92 mg/dL). Liver Function Tests: Recent Labs  Lab 08/07/18 1148  AST 25  ALT 25  ALKPHOS 87  BILITOT 1.2  PROT 6.6  ALBUMIN 3.0*   Recent Labs  Lab 08/07/18 1148  LIPASE 29   No results for input(s): AMMONIA in the last 168 hours. Coagulation Profile: No results for input(s): INR, PROTIME in the last 168 hours. Cardiac Enzymes: No results for input(s): CKTOTAL, CKMB, CKMBINDEX, TROPONINI in the last 168 hours. BNP (last 3 results) No results for input(s): PROBNP in the last 8760 hours. HbA1C: No results for input(s): HGBA1C in the last 72 hours. CBG: No results for input(s): GLUCAP in the last 168 hours. Lipid Profile: No results for input(s): CHOL, HDL, LDLCALC, TRIG, CHOLHDL, LDLDIRECT in the last 72 hours. Thyroid Function Tests: No results for input(s): TSH, T4TOTAL, FREET4, T3FREE, THYROIDAB in the last 72 hours. Anemia Panel: No results for input(s): VITAMINB12, FOLATE, FERRITIN, TIBC, IRON, RETICCTPCT in the last 72 hours. Urine analysis:    Component Value Date/Time   COLORURINE AMBER (A) 05/15/2018 0008   APPEARANCEUR CLEAR 05/15/2018 0008   LABSPEC 1.027 05/15/2018 0008   PHURINE 5.0 05/15/2018 0008   GLUCOSEU NEGATIVE 05/15/2018 0008   HGBUR NEGATIVE 05/15/2018 0008   BILIRUBINUR NEGATIVE 05/15/2018 0008   KETONESUR NEGATIVE 05/15/2018 0008   PROTEINUR NEGATIVE 05/15/2018 0008   NITRITE NEGATIVE 05/15/2018 0008   LEUKOCYTESUR NEGATIVE 05/15/2018  0008   Sepsis Labs: !!!!!!!!!!!!!!!!!!!!!!!!!!!!!!!!!!!!!!!!!!!! @LABRCNTIP (procalcitonin:4,lacticidven:4) ) Recent Results (from the past 240 hour(s))  SARS Coronavirus 2 (CEPHEID- Performed in [place] hospital lab), Hosp Order     Status: None   Collection Time: 08/07/18 11:56 AM  Result Value Ref Range Status   SARS Coronavirus 2 NEGATIVE NEGATIVE Final  Comment: (NOTE) If result is NEGATIVE SARS-CoV-2 target nucleic acids are NOT DETECTED. The SARS-CoV-2 RNA is generally detectable in upper and lower  respiratory specimens during the acute phase of infection. The lowest  concentration of SARS-CoV-2 viral copies this assay can detect is 250  copies / mL. A negative result does not preclude SARS-CoV-2 infection  and should not be used as the sole basis for treatment or other  patient management decisions.  A negative result may occur with  improper specimen collection / handling, submission of specimen other  than nasopharyngeal swab, presence of viral mutation(s) within the  areas targeted by this assay, and inadequate number of viral copies  (<250 copies / mL). A negative result must be combined with clinical  observations, patient history, and epidemiological information. If result is POSITIVE SARS-CoV-2 target nucleic acids are DETECTED. The SARS-CoV-2 RNA is generally detectable in upper and lower  respiratory specimens dur ing the acute phase of infection.  Positive  results are indicative of active infection with SARS-CoV-2.  Clinical  correlation with patient history and other diagnostic information is  necessary to determine patient infection status.  Positive results do  not rule out bacterial infection or co-infection with other viruses. If result is PRESUMPTIVE POSTIVE SARS-CoV-2 nucleic acids MAY BE PRESENT.   A presumptive positive result was obtained on the submitted specimen  and confirmed on repeat testing.  While 2019 novel coronavirus  (SARS-CoV-2)  nucleic acids may be present in the submitted sample  additional confirmatory testing may be necessary for epidemiological  and / or clinical management purposes  to differentiate between  SARS-CoV-2 and other Sarbecovirus currently known to infect humans.  If clinically indicated additional testing with an alternate test  methodology [phone]) is advised. The SARS-CoV-2 RNA is generally  detectable in upper and lower respiratory sp ecimens during the acute  phase of infection. The expected result is Negative. Fact Sheet for Patients:  StrictlyIdeas.no Fact Sheet for Healthcare Providers: BankingDealers.co.za This test is not yet approved or cleared by the [place] FDA and has been authorized for detection and/or diagnosis of SARS-CoV-2 by FDA under an Emergency Use Authorization (EUA).  This EUA will remain in effect (meaning this test can be used) for the duration of the COVID-19 declaration under Section 564(b)(1) of the Act, 21 U.S.C. section 360bbb-3(b)(1), unless the authorization is terminated or revoked sooner. Performed at [facility], [place] [address] [place], [place] [address]      Radiological Exams on Admission: Ct Abdomen Pelvis W Contrast  Result Date: 08/07/2018 CLINICAL DATA:  Weakness, nausea, and diarrhea for the past week. Intermittent hematochezia. History of lung cancer. EXAM: CT ABDOMEN AND PELVIS WITH CONTRAST TECHNIQUE: Multidetector CT imaging of the abdomen and pelvis was performed using the standard protocol following bolus administration of intravenous contrast. CONTRAST:  12mL OMNIPAQUE IOHEXOL 300 MG/ML  SOLN COMPARISON:  PET-CT dated July 10, 2018. FINDINGS: Lower chest: Right-sided PleurX catheter again noted with trace loculated effusion. Right-sided pleural and mediastinal nodularity is grossly unchanged. Unchanged 9 x 8 mm nodule in the right lower lobe. Hepatobiliary: Unchanged  small simple cyst in the hepatic dome. No other focal liver abnormality. The gallbladder is unremarkable. No biliary dilatation. Pancreas: Unremarkable. No pancreatic ductal dilatation or surrounding inflammatory changes. Spleen: Normal in size without focal abnormality. Adrenals/Urinary Tract: The adrenal glands are unremarkable. No focal renal lesion. Unchanged punctate bilateral nonobstructive renal calculi. No hydronephrosis. The bladder is unremarkable. Stomach/Bowel: The stomach is within normal limits.  There is mild circumferential wall thickening of the terminal ileum the remaining small bowel and colon are unremarkable. Normal appendix. Vascular/Lymphatic: Aortic atherosclerosis. No enlarged abdominal or pelvic lymph nodes. Reproductive: Prostate is unremarkable. Other: No abdominal wall hernia or abnormality. No abdominopelvic ascites. No pneumoperitoneum. Musculoskeletal: No acute or significant osseous findings. IMPRESSION: 1. Terminal ileitis. 2. 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 3. Unchanged punctate nonobstructive bilateral nephrolithiasis. 4.  Aortic atherosclerosis (ICD10-I70.0). Electronically Signed   By: Titus Dubin M.D.   On: 08/07/2018 14:53   Dg Chest Portable 1 View  Result Date: 08/07/2018 CLINICAL DATA:  Lung cancer, weakness EXAM: PORTABLE CHEST 1 VIEW COMPARISON:  06/12/2018 FINDINGS: Right Port-A-Cath remains in place, unchanged. Heart is normal size. Right PleurX catheter remains in place. Right pleural effusion and/or pleural thickening has increased since prior study. No pneumothorax. Right lower lobe atelectasis or infiltrate. Left lung clear. IMPRESSION: Increasing pleural density on the right which could reflect pleural effusion and/or pleural thickening. No pneumothorax. Right lower lobe atelectasis or infiltrate. Electronically Signed   By: Rolm Baptise M.D.   On: 08/07/2018 12:42    EKG: Independently reviewed.  Showed sinus rhythm at a  rate of 89 and a QTC of 470.  There is no evidence of ST elevation or ST depression on my interpretation.  Assessment/Plan Active Problems:   Neutropenic fever (HCC)  Severe neutropenia with concern for febrile neutropenia -Admit to inpatient telemetry -Patient's WBC was 1000 and his ANC was 100 -We will provide Granix 480 mcg daily until his [place] is greater than 1000 per Onc Recc's -Continue supportive care and IV fluids with normal saline +40 mEq of KCl at a rate of 100 mL's per hour -Start broad-spectrum antibiotics with IV cefepime and IV vancomycin and de-escalate as necessary -SARS-CoV-2 testing was negative and blood cultures x2 are pending and will need to follow-up on blood cultures -CT of the abdomen pelvis showed terminal ileitis and 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 Unchanged punctate nonobstructive bilateral nephrolithiasis. Aortic atherosclerosis -Chest x-ray showed "Increasing pleural density on the right which could reflect pleural effusion and/or pleural thickening. No pneumothorax. Right lower lobe atelectasis or infiltrate." -No UA was done and there is no urine culture so will obtain one now -I spoke with oncology Dr. Earlie Server who recommends broad-spectrum antibiotics and starting Granix -I also spoke with Dr. Cristina Gong who feels the patient has typhlitis or neutropenic enterocolitis and recommend supportive care and treatment -Continue to follow cultures and continue with symptomatic treatment -Patient will be placed on clear liquid diet we will continue this for now and allow for bowel rest -CBC on admission was 1.0 will need to continue to follow and repeat CBC in a.m.  Typhlitis/Neutropenic Enterocolitis -Patient's WBC was 1,000 and absolute neutrophil count was 100 -Clinical picture fits with neutropenic enterocolitis and typhlitis given the patient has had recently chemotherapy on the 12th and is neutropenic along with nausea, vomiting,  abdominal pain, bloody diarrhea and imaging of terminal ileitis as patient does not have any history of inflammatory bowel disease; Also has generalized Weakness -Dr. Cristina Gong also feels the same and recommends continuing empiric antibiotics and supportive care and will continue IV fluid hydration with normal saline -Continue clear liquid diet -Patient will be getting Granix injections to boost his immune system and will need to follow CBC with differential daily -Continue antiemetics as necessary and needed -Will get PT/OT to Evaluate and Treat   Pancytopenia -In the setting of chemotherapy -Patient's WBC was  1000, hemoglobin/hematocrit was 10.3/29.6, and platelet count was 65,000 -Continue to monitor for signs and symptoms of bleeding -Transfuse PRBCs if hemoglobin drops below 7  -Transfuse platelets if platelet count drops below 20,000  Hyponatremia -In the setting of nausea, vomiting, diarrhea -Continue maintenance IV fluids at this time  Severe hypokalemia Patient's potassium this morning was 2.5 -Replete with IV potassium chloride 20 mEq and 40 mEq p.o. in the ED -We will repeat this dosage and check a magnesium level -We will start normal saline at a rate 100 mL's per hour +40 mEq of potassium chloride -Continue to monitor and replete as necessary -Repeat CBC in a.m.  Non-small cell adenocarcinoma of the right lung stage 4 (T1b, N2, M1) with recurrent right pleural effusion status post Pleurx catheter drainage  -Ccurrently undergoing chemotherapy with carboplatin, Alimta, and Keytruda status post cycle 2 with Last Chemo on 07/24/2018 -Medical oncology notified and they will see the patient in the a.m. -C/w Symptomatic Care -Drain Pleur-X As Necessary   Hypercholesterolemia -C/w Atorvastatin 40 mg po qHS  Memory Impairment -C/w Donepezil 5 mg po QHS  Hx of OSA -Not on CPAP  GERD -C/w Pantoprazole 40 mg po Daily  SSS s/p Pacer -C/w ASA 81 mg po Daily  -C/w Monitoring  on Telmetry  HTN -Hold Lisinopril 5 mg po Currently  -Blood Pressure was on the softer side and was 126/71 -Continue to Monitor per Protocol   COPD -C/w Albuterol Neb 2.5 mg NE BIDprn Wheezing and SOB  PTSD -C/w Escitalopram 20 mg po Daily   Whole-body diffuse rash; Drug Induced -Chemotherapy-induced and on the legs -We will have oncology weigh in -We will consult wound nurse for blisters on feet  Morbid Obesity -Estimated body mass index is 31.19 kg/m as calculated from the following:   Height as of this encounter: 6' (1.829 m).   Weight as of this encounter: 104.3 kg. -Weight Loss and Dietary Counseling Given  DVT prophylaxis: SCDs Code Status: FULL CODE Family Communication: No family present at bedside Disposition Plan: Remain Inpatient until improved and will need PT/OT to evaluate and Treat Consults called: Discussed Case with Gastroenterologist Dr. Cristina Gong and Medical Oncology Dr. Julien Nordmann will consult in AM  Admission status: Inpatient Telemetry   Severity of Illness: The appropriate patient status for this patient is INPATIENT. Inpatient status is judged to be reasonable and necessary in order to provide the required intensity of service to ensure the patient's safety. The patient's presenting symptoms, physical exam findings, and initial radiographic and laboratory data in the context of their chronic comorbidities is felt to place them at high risk for further clinical deterioration. Furthermore, it is not anticipated that the patient will be medically stable for discharge from the hospital within 2 midnights of admission. The following factors support the patient status of inpatient.   " The patient's presenting symptoms include N/V/D/Abdominal Pain and Weakness. " The worrisome physical exam findings include Rash and dry mm. " The initial radiographic and laboratory data are worrisome because of Terminal Colitis . " The chronic co-morbidities are listed as above in  the HPI.  * I certify that at the point of admission it is my clinical judgment that the patient will require inpatient hospital care spanning beyond 2 midnights from the point of admission due to high intensity of service, high risk for further deterioration and high frequency of surveillance required.Kerney Elbe, D.O. [facility] PAGER is on [place]  If 7PM-7AM, please contact night-coverage [email or web address]  Password [email or web address]  08/07/2018, 4:13 PM

## 2018-08-07 NOTE — ED Provider Notes (Signed)
[facility] DEPT Provider Note   CSN: [id number] Arrival date & time: 08/07/18  1139    History   Chief Complaint Chief Complaint  Patient presents with  . Weakness    HPI Daniel Bryant is a 58 y.o. male.     HPI 36-year-old male with a history of lung cancer currently undergoing chemotherapy presents the emergency department with worsening generalized weakness over the past 4 to 5 days with associated nausea and diarrhea.  Initially there was some blood in his diarrhea but this is since resolved.  Chills without documented fever.  He feels weak and dehydrated.  He states pain from the top of his head all the way down to his feet.  Symptoms are moderate in severity at this time.   Past Medical History:  Diagnosis Date  . Asthma   . Cancer ([place])   . COPD (chronic obstructive pulmonary disease) ([place])   . GERD (gastroesophageal reflux disease)   . High cholesterol   . History of petit-mal seizures   . Hyperlipidemia   . Hypertension   . Memory impairment   . OSA (obstructive sleep apnea)    Does not tolerate CPAP  . Pacemaker   . PTSD (post-traumatic stress disorder)   . Sick sinus syndrome [facility])     Patient Active Problem List   Diagnosis Date Noted  . Drug-induced skin rash 07/24/2018  . Goals of care, counseling/discussion 06/14/2018  . Encounter for antineoplastic chemotherapy 06/14/2018  . Encounter for antineoplastic immunotherapy 06/14/2018  . Adenocarcinoma of right lung ([place]) 05/02/2018  . COPD (chronic obstructive pulmonary disease) ([place]) 05/02/2018  . Recurrent pleural effusion on right 05/01/2018  . Pacemaker 04/21/2018  . 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 Hypokalemia 04/05/2018  . High cholesterol 04/05/2018  . Hypertension 04/05/2018  . Polycythemia 04/05/2018    Past Surgical History:  Procedure Laterality Date  . CARDIAC SURGERY    . CHEST TUBE INSERTION Right 05/16/2018   Procedure: INSERTION PLEURAL  DRAINAGE CATHETER;  Surgeon: Ivin Poot, MD;  Location: [facility];  Service: Thoracic;  Laterality: Right;  . CHEST TUBE INSERTION Right 05/16/2018   Procedure: Chest Tube Insertion;  Surgeon: Ivin Poot, MD;  Location: [facility];  Service: Thoracic;  Laterality: Right;  . PACEMAKER INSERTION          Home Medications    Prior to Admission medications   Medication Sig Start Date End Date Taking? Authorizing Provider  albuterol (PROVENTIL) (2.5 MG/3ML) 0.083% nebulizer solution Take 3 mLs (2.5 mg total) by nebulization 2 (two) times daily as needed for wheezing or shortness of breath. 04/09/18   Kayleen Memos, DO  aspirin EC 81 MG EC tablet Take 1 tablet (81 mg total) by mouth daily. 05/22/18   Antony Odea, PA-C  atorvastatin (LIPITOR) 40 MG tablet Take 40 mg by mouth at bedtime.    [provider]  Cholecalciferol (VITAMIN D3) 25 MCG (1000 UT) CAPS Take 1,000 Units by mouth daily.    [provider]  diphenhydrAMINE (BENADRYL) 25 MG tablet Take 1 tablet (25 mg total) by mouth every 6 (six) hours as needed for itching or allergies. 07/30/18   Domenic Moras, PA-C  donepezil (ARICEPT) 5 MG tablet Take 5 mg by mouth at bedtime.    [provider]  escitalopram (LEXAPRO) 20 MG tablet Take 20 mg by mouth daily.     [provider]  folic acid (FOLVITE) 1 MG tablet Take 1 tablet (1  mg total) by mouth daily. 06/14/18   Curt Bears, MD  lidocaine (XYLOCAINE) 2 % solution Use as directed 5 mLs in the mouth or throat every 3 (three) hours as needed for mouth pain. Gargle and spit Patient not taking: Reported on 07/24/2018 07/04/18   [name] E., PA-C  lisinopril (PRINIVIL,ZESTRIL) 5 MG tablet Take 5 mg by mouth daily.     [provider]  pantoprazole (PROTONIX) 40 MG tablet Take 1 tablet (40 mg total) by mouth daily. 04/10/18   Kayleen Memos, DO  predniSONE (DELTASONE) 10 MG tablet Take 100 mg p.o. daily for 1 week followed by 80 mg p.o. daily  for 1 week followed by 60 mg p.o. daily for 1 week followed by 40 mg p.o. daily for 1 week followed by 20 mg p.o. daily for 1 weeks then 10 mg p.o. daily for 1 week. 07/30/18   Domenic Moras, PA-C  prochlorperazine (COMPAZINE) 10 MG tablet Take 1 tablet (10 mg total) by mouth every 6 (six) hours as needed for nausea or vomiting. 07/24/18   Curt Bears, MD  vitamin B-12 (CYANOCOBALAMIN) 500 MCG tablet Take 500 mcg by mouth daily.    [provider]    Family History Family History  Problem Relation Age of Onset  . Alzheimer's disease Mother   . Cancer Mother   . Memory loss Father     Social History Social History   Tobacco Use  . Smoking status: Former Smoker    Types: E-cigarettes  . Smokeless tobacco: Current User    Types: Chew  Substance Use Topics  . Alcohol use: Yes    Frequency: Never  . Drug use: No     Allergies   Other   Review of Systems Review of Systems  All other systems reviewed and are negative.    Physical Exam Updated Vital Signs BP (!) 131/59   Pulse 95   Temp 99.6 F (37.6 C) (Oral)   Resp 18   Ht 6' (1.829 m)   Wt 104.3 kg   SpO2 94%   BMI 31.19 kg/m   Physical Exam Vitals signs and nursing note reviewed.  Constitutional:      Appearance: He is well-developed.  HENT:     Head: Normocephalic and atraumatic.  Neck:     Musculoskeletal: Normal range of motion.  Cardiovascular:     Rate and Rhythm: Normal rate and regular rhythm.     Heart sounds: Normal heart sounds.  Pulmonary:     Effort: Pulmonary effort is normal. No respiratory distress.     Breath sounds: Normal breath sounds.  Abdominal:     General: There is no distension.     Palpations: Abdomen is soft.     Comments: Generalized abdominal tenderness without guarding or rebound.  Musculoskeletal: Normal range of motion.  Skin:    General: Skin is warm and dry.  Neurological:     Mental Status: He is alert and oriented to person, place, and time.   Psychiatric:        Judgment: Judgment normal.      ED Treatments / Results  Labs (all labs ordered are listed, but only abnormal results are displayed) Labs Reviewed  CBC WITH DIFFERENTIAL/PLATELET - Abnormal; Notable for the following components:      Result Value   WBC 1.0 (*)    RBC 3.51 (*)    Hemoglobin 10.3 (*)    HCT 29.6 (*)    Platelets 65 (*)  Neutro Abs 0.1 (*)    Lymphs Abs 0.6 (*)    All other components within normal limits  COMPREHENSIVE METABOLIC PANEL - Abnormal; Notable for the following components:   Sodium 133 (*)    Potassium 2.5 (*)    Chloride 92 (*)    Calcium 7.8 (*)    Albumin 3.0 (*)    All other components within normal limits  SARS CORONAVIRUS 2 (HOSPITAL ORDER, [facility])  CULTURE, BLOOD (ROUTINE X 2)  CULTURE, BLOOD (ROUTINE X 2)  URINE CULTURE  LIPASE, BLOOD  URINALYSIS, ROUTINE W REFLEX MICROSCOPIC    EKG None  Radiology Ct Abdomen Pelvis W Contrast  Result Date: 08/07/2018 CLINICAL DATA:  Weakness, nausea, and diarrhea for the past week. Intermittent hematochezia. History of lung cancer. EXAM: CT ABDOMEN AND PELVIS WITH CONTRAST TECHNIQUE: Multidetector CT imaging of the abdomen and pelvis was performed using the standard protocol following bolus administration of intravenous contrast. CONTRAST:  140mL OMNIPAQUE IOHEXOL 300 MG/ML  SOLN COMPARISON:  PET-CT dated July 10, 2018. FINDINGS: Lower chest: Right-sided PleurX catheter again noted with trace loculated effusion. Right-sided pleural and mediastinal nodularity is grossly unchanged. Unchanged 9 x 8 mm nodule in the right lower lobe. Hepatobiliary: Unchanged small simple cyst in the hepatic dome. No other focal liver abnormality. The gallbladder is unremarkable. No biliary dilatation. Pancreas: Unremarkable. No pancreatic ductal dilatation or surrounding inflammatory changes. Spleen: Normal in size without focal abnormality. Adrenals/Urinary Tract: The  adrenal glands are unremarkable. No focal renal lesion. Unchanged punctate bilateral nonobstructive renal calculi. No hydronephrosis. The bladder is unremarkable. Stomach/Bowel: The stomach is within normal limits. There is mild circumferential wall thickening of the terminal ileum the remaining small bowel and colon are unremarkable. Normal appendix. Vascular/Lymphatic: Aortic atherosclerosis. No enlarged abdominal or pelvic lymph nodes. Reproductive: Prostate is unremarkable. Other: No abdominal wall hernia or abnormality. No abdominopelvic ascites. No pneumoperitoneum. Musculoskeletal: No acute or significant osseous findings. IMPRESSION: 1. Terminal ileitis. 2. 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 3. Unchanged punctate nonobstructive bilateral nephrolithiasis. 4.  Aortic atherosclerosis (ICD10-I70.0). Electronically Signed   By: Titus Dubin M.D.   On: 08/07/2018 14:53   Dg Chest Portable 1 View  Result Date: 08/07/2018 CLINICAL DATA:  Lung cancer, weakness EXAM: PORTABLE CHEST 1 VIEW COMPARISON:  06/12/2018 FINDINGS: Right Port-A-Cath remains in place, unchanged. Heart is normal size. Right PleurX catheter remains in place. Right pleural effusion and/or pleural thickening has increased since prior study. No pneumothorax. Right lower lobe atelectasis or infiltrate. Left lung clear. IMPRESSION: Increasing pleural density on the right which could reflect pleural effusion and/or pleural thickening. No pneumothorax. Right lower lobe atelectasis or infiltrate. Electronically Signed   By: Rolm Baptise M.D.   On: 08/07/2018 12:42    Procedures .Critical Care Performed by: Jola Schmidt, MD Authorized by: Jola Schmidt, MD   Critical care provider statement:    Critical care time (minutes):  33   Critical care was time spent personally by me on the following activities:  Discussions with consultants, evaluation of patient's response to treatment, examination of patient,  ordering and performing treatments and interventions, ordering and review of laboratory studies, ordering and review of radiographic studies, pulse oximetry, re-evaluation of patient's condition, obtaining history from patient or surrogate and review of old charts   (including critical care time)  Medications Ordered in ED Medications  potassium chloride 10 mEq in 100 mL IVPB (10 mEq Intravenous New Bag/Given 08/07/18 1412)  vancomycin (VANCOCIN) 2,000 mg  in sodium chloride 0.9 % 500 mL IVPB (2,000 mg Intravenous New Bag/Given 08/07/18 1414)  sodium chloride (PF) 0.9 % injection (has no administration in time range)  sodium chloride 0.9 % bolus 1,000 mL (0 mLs Intravenous Stopped 08/07/18 1359)  ondansetron (ZOFRAN) injection 4 mg (4 mg Intravenous Given 08/07/18 1227)  HYDROmorphone (DILAUDID) injection 1 mg (1 mg Intravenous Given 08/07/18 1227)  potassium chloride SA (K-DUR) CR tablet 40 mEq (40 mEq Oral Given 08/07/18 1325)  ceFEPIme (MAXIPIME) 2 g in sodium chloride 0.9 % 100 mL IVPB (0 g Intravenous Stopped 08/07/18 1409)  iohexol (OMNIPAQUE) 300 MG/ML solution 100 mL (100 mLs Intravenous Contrast Given 08/07/18 1427)     Initial Impression / Assessment and Plan / ED Course  I have reviewed the triage vital signs and the nursing notes.  Pertinent labs & imaging results that were available during my care of the patient were reviewed by me and considered in my medical decision making (see chart for details).        Neutropenia and profound hypokalemia found.  IV potassium now.  Admission for hypokalemia.  Neutropenia with possible fever given oral temp 99.6 and chills at home.  Blood and urine cultures obtained.  Broad-spectrum antibiotics now.  Patient will be admitted to the hospital for ongoing observation as well as treatment of his profound hypokalemia.  CT scan demonstrates evidence of terminal ileitis no known history of inflammatory bowel disease.  Final Clinical Impressions(s) / ED  Diagnoses   Final diagnoses:  None    ED Discharge Orders    None       Jola Schmidt, MD 08/07/18 1500

## 2018-08-07 NOTE — Telephone Encounter (Addendum)
Last friday pt was instructed to call 911 for rash .Pamala Hurry said he did not go. She said she called him sat and he was doing fine. Pt is due for labs today. Today home health nurse called me that pt was on way to ED .

## 2018-08-07 NOTE — TOC Initial Note (Signed)
Transition of Care [facility]) - Initial/Assessment Note    Patient Details  Name: Daniel Bryant MRN: [id number] Date of Birth: [birth year]  [facility]) CM/SW Contact:    Daniel Rasher, RN Phone Number: 08/07/2018, 2:52 PM  Clinical Narrative:                 Spoke to pt and states he goes to the [facility] and PCP is Daniel Bryant. States he has [place] with [facility]. Waiting final recommendations for home. Will continue to follow for dc needs.   Expected Discharge Plan: [place] Barriers to Discharge: Continued Medical Work up   Patient Goals and CMS Choice        Expected Discharge Plan and Services Expected Discharge Plan: [place]   Discharge Planning Services: CM Consult Post Acute Care Choice: [place] arrangements for the past 2 months: Apartment                                      Prior Living Arrangements/Services Living arrangements for the past 2 months: Apartment Lives with:: Self Patient language and need for interpreter reviewed:: Yes        Need for Family Participation in Patient Care: No (Comment) Care giver support system in place?: No (comment)   Criminal Activity/Legal Involvement Pertinent to Current Situation/Hospitalization: No - Comment as needed  Activities of Daily Living      Permission Sought/Granted Permission sought to share information with : Case Manager, PCP, Other (comment) Permission granted to share information with : Yes, Verbal Permission Granted              Emotional Assessment       Orientation: : Oriented to Self, Oriented to Place, Oriented to  Time, Oriented to Situation   Psych Involvement: No (comment)  Admission diagnosis:  generalized weakness; nausea Patient Active Problem List   Diagnosis Date Noted  . Drug-induced skin rash 07/24/2018  . Goals of care, counseling/discussion 06/14/2018  . Encounter for antineoplastic  chemotherapy 06/14/2018  . Encounter for antineoplastic immunotherapy 06/14/2018  . Adenocarcinoma of right lung ([place]) 05/02/2018  . COPD (chronic obstructive pulmonary disease) ([place]) 05/02/2018  . Recurrent pleural effusion on right 05/01/2018  . Pacemaker 04/21/2018  . 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 Hypokalemia 04/05/2018  . High cholesterol 04/05/2018  . Hypertension 04/05/2018  . Polycythemia 04/05/2018   PCP:  Default, Provider, MD Pharmacy:   [facility], [place]. [place] [address] Phone: [phone] Fax: [phone]  [place], [place] - [place] [address] Phone: [phone] Fax: [phone]     Social Determinants of Health (SDOH) Interventions    Readmission Risk Interventions No flowsheet data found.

## 2018-08-07 NOTE — ED Notes (Signed)
Patient transported to CT 

## 2018-08-07 NOTE — ED Notes (Signed)
ED TO INPATIENT HANDOFF REPORT  ED Nurse Name and Phone #:   S Name/Age/Gender Daniel Bryant 58 y.o. male Room/Bed: WA08/WA08  Code Status   Code Status: Prior  Home/SNF/Other Home Patient oriented to: self, place, time and situation Is this baseline? Yes   Triage Complete: Triage complete  Chief Complaint generalized weakness; nausea  Triage Note Pt BIBA from home. Pt has had weakness, nausea, and diarrhea x 1 week, which has gotten worse the last 24 hours. Pt c/o bloody diarrhea intermittently. Pt has hx of lung ca. Pt has chest tube for pleural fluid, emptied today. VSS.   Allergies Allergies  Allergen Reactions  . Other Other (See Comments)    Patient states "I had a scrotal procedure a couple of years ago, there was something they gave me to relax me and I started freaking out and seeing things".  Unable to ascertain from pt or his records what medication was.    Level of Care/Admitting Diagnosis ED Disposition    ED Disposition Condition Comment   Admit  Hospital Area: [place] [[id number]]  Level of Care: Telemetry [5]  Admit to tele based on following criteria: Monitor QTC interval  Covid Evaluation: N/A  Diagnosis: Neutropenic fever [facility]) [[id number]]  Admitting Physician: Raiford Noble LATIF [[id number]]  Attending Physician: Raiford Noble LATIF [[id number]]  Estimated length of stay: past midnight tomorrow  Certification:: I certify this patient will need inpatient services for at least 2 midnights  PT Class (Do Not Modify): Inpatient [101]  PT Acc Code (Do Not Modify): Private [1]       B Medical/Surgery History Past Medical History:  Diagnosis Date  . Asthma   . Cancer ([place])   . COPD (chronic obstructive pulmonary disease) ([place])   . GERD (gastroesophageal reflux disease)   . High cholesterol   . History of petit-mal seizures   . Hyperlipidemia   . Hypertension   . Memory impairment   . OSA (obstructive sleep apnea)    Does not  tolerate CPAP  . Pacemaker   . PTSD (post-traumatic stress disorder)   . Sick sinus syndrome [facility])    Past Surgical History:  Procedure Laterality Date  . CARDIAC SURGERY    . CHEST TUBE INSERTION Right 05/16/2018   Procedure: INSERTION PLEURAL DRAINAGE CATHETER;  Surgeon: Ivin Poot, MD;  Location: [place];  Service: Thoracic;  Laterality: Right;  . CHEST TUBE INSERTION Right 05/16/2018   Procedure: Chest Tube Insertion;  Surgeon: Ivin Poot, MD;  Location: [facility];  Service: Thoracic;  Laterality: Right;  . PACEMAKER INSERTION       A IV Location/Drains/Wounds Patient Lines/Drains/Airways Status   Active Line/Drains/Airways    Name:   Placement date:   Placement time:   Site:   Days:   Peripheral IV 08/07/18 Left Antecubital   08/07/18    1230    Antecubital   less than 1   Incision (Closed) 05/16/18 Chest Right   05/16/18    1621     83          Intake/Output Last 24 hours No intake or output data in the 24 hours ending 08/07/18 1709  Labs/Imaging Results for orders placed or performed during the hospital encounter of 08/07/18 (from the past 48 hour(s))  CBC with Differential/Platelet     Status: Abnormal   Collection Time: 08/07/18 11:48 AM  Result Value Ref Range   WBC 1.0 (LL) 4.0 - 10.5 K/uL    Comment:  This critical result has verified and been called to [facility]. RN by Raelyn Ensign on 05 26 2020 at 1240, and has been read back. CRITICAL RESULT VERIFIED   RBC 3.51 (L) 4.22 - 5.81 MIL/uL   Hemoglobin 10.3 (L) 13.0 - 17.0 g/dL   HCT 29.6 (L) 39.0 - 52.0 %   MCV 84.3 80.0 - 100.0 fL   MCH 29.3 26.0 - 34.0 pg   MCHC 34.8 30.0 - 36.0 g/dL   RDW 13.2 11.5 - 15.5 %   Platelets 65 (L) 150 - 400 K/uL    Comment: Immature Platelet Fraction may be clinically indicated, consider ordering this additional test [id number]    nRBC 0.0 0.0 - 0.2 %   Neutrophils Relative % 14 %   Neutro Abs 0.1 (L) 1.7 - 7.7 K/uL   Lymphocytes Relative 61 %   Lymphs Abs 0.6 (L) 0.7 -  4.0 K/uL   Monocytes Relative 22 %   Monocytes Absolute 0.2 0.1 - 1.0 K/uL   Eosinophils Relative 1 %   Eosinophils Absolute 0.0 0.0 - 0.5 K/uL   Basophils Relative 0 %   Basophils Absolute 0.0 0.0 - 0.1 K/uL   WBC Morphology MILD LEFT SHIFT (1-5% METAS, OCC MYELO, OCC BANDS)    Immature Granulocytes 2 %   Abs Immature Granulocytes 0.02 0.00 - 0.07 K/uL    Comment: Performed at [facility], [place] [address] [place], [place] [address]  Comprehensive metabolic panel     Status: Abnormal   Collection Time: 08/07/18 11:48 AM  Result Value Ref Range   Sodium 133 (L) 135 - 145 mmol/L   Potassium 2.5 (LL) 3.5 - 5.1 mmol/L    Comment: CRITICAL RESULT CALLED TO, READ BACK BY AND VERIFIED WITH: S.BINGHAM RN AT 1303 ON 08/07/2018 BY S.VANHOORNE    Chloride 92 (L) 98 - 111 mmol/L   CO2 26 22 - 32 mmol/L   Glucose, Bld 95 70 - 99 mg/dL   BUN 14 6 - 20 mg/dL   Creatinine, Ser 0.92 0.61 - 1.24 mg/dL   Calcium 7.8 (L) 8.9 - 10.3 mg/dL   Total Protein 6.6 6.5 - 8.1 g/dL   Albumin 3.0 (L) 3.5 - 5.0 g/dL   AST 25 15 - 41 U/L   ALT 25 0 - 44 U/L   Alkaline Phosphatase 87 38 - 126 U/L   Total Bilirubin 1.2 0.3 - 1.2 mg/dL   GFR calc non Af Amer >60 >60 mL/min   GFR calc Af Amer >60 >60 mL/min   Anion gap 15 5 - 15    Comment: Performed at [facility], [place] [address], [place], [place] [address]  Lipase, blood     Status: None   Collection Time: 08/07/18 11:48 AM  Result Value Ref Range   Lipase 29 11 - 51 U/L    Comment: Performed at [facility], [place] [address], [place],  [address]  SARS Coronavirus 2 (CEPHEID- Performed in [place] hospital lab), Hosp Order     Status: None   Collection Time: 08/07/18 11:56 AM  Result Value Ref Range   SARS Coronavirus 2 NEGATIVE NEGATIVE    Comment: (NOTE) If result is NEGATIVE SARS-CoV-2 target nucleic acids are NOT DETECTED. The SARS-CoV-2 RNA is generally detectable in upper and lower   respiratory specimens during the acute phase of infection. The lowest  concentration of SARS-CoV-2 viral copies this assay can detect is 250  copies / mL. A negative result  does not preclude SARS-CoV-2 infection  and should not be used as the sole basis for treatment or other  patient management decisions.  A negative result may occur with  improper specimen collection / handling, submission of specimen other  than nasopharyngeal swab, presence of viral mutation(s) within the  areas targeted by this assay, and inadequate number of viral copies  (<250 copies / mL). A negative result must be combined with clinical  observations, patient history, and epidemiological information. If result is POSITIVE SARS-CoV-2 target nucleic acids are DETECTED. The SARS-CoV-2 RNA is generally detectable in upper and lower  respiratory specimens dur ing the acute phase of infection.  Positive  results are indicative of active infection with SARS-CoV-2.  Clinical  correlation with patient history and other diagnostic information is  necessary to determine patient infection status.  Positive results do  not rule out bacterial infection or co-infection with other viruses. If result is PRESUMPTIVE POSTIVE SARS-CoV-2 nucleic acids MAY BE PRESENT.   A presumptive positive result was obtained on the submitted specimen  and confirmed on repeat testing.  While 2019 novel coronavirus  (SARS-CoV-2) nucleic acids may be present in the submitted sample  additional confirmatory testing may be necessary for epidemiological  and / or clinical management purposes  to differentiate between  SARS-CoV-2 and other Sarbecovirus currently known to infect humans.  If clinically indicated additional testing with an alternate test  methodology [phone]) is advised. The SARS-CoV-2 RNA is generally  detectable in upper and lower respiratory sp ecimens during the acute  phase of infection. The expected result is Negative. Fact  Sheet for Patients:  StrictlyIdeas.no Fact Sheet for Healthcare Providers: BankingDealers.co.za This test is not yet approved or cleared by the [place] FDA and has been authorized for detection and/or diagnosis of SARS-CoV-2 by FDA under an Emergency Use Authorization (EUA).  This EUA will remain in effect (meaning this test can be used) for the duration of the COVID-19 declaration under Section 564(b)(1) of the Act, 21 U.S.C. section 360bbb-3(b)(1), unless the authorization is terminated or revoked sooner. Performed at [facility], [place] [address] [place], [place] [address]    Ct Abdomen Pelvis W Contrast  Result Date: 08/07/2018 CLINICAL DATA:  Weakness, nausea, and diarrhea for the past week. Intermittent hematochezia. History of lung cancer. EXAM: CT ABDOMEN AND PELVIS WITH CONTRAST TECHNIQUE: Multidetector CT imaging of the abdomen and pelvis was performed using the standard protocol following bolus administration of intravenous contrast. CONTRAST:  18mL OMNIPAQUE IOHEXOL 300 MG/ML  SOLN COMPARISON:  PET-CT dated July 10, 2018. FINDINGS: Lower chest: Right-sided PleurX catheter again noted with trace loculated effusion. Right-sided pleural and mediastinal nodularity is grossly unchanged. Unchanged 9 x 8 mm nodule in the right lower lobe. Hepatobiliary: Unchanged small simple cyst in the hepatic dome. No other focal liver abnormality. The gallbladder is unremarkable. No biliary dilatation. Pancreas: Unremarkable. No pancreatic ductal dilatation or surrounding inflammatory changes. Spleen: Normal in size without focal abnormality. Adrenals/Urinary Tract: The adrenal glands are unremarkable. No focal renal lesion. Unchanged punctate bilateral nonobstructive renal calculi. No hydronephrosis. The bladder is unremarkable. Stomach/Bowel: The stomach is within normal limits. There is mild circumferential wall thickening of the  terminal ileum the remaining small bowel and colon are unremarkable. Normal appendix. Vascular/Lymphatic: Aortic atherosclerosis. No enlarged abdominal or pelvic lymph nodes. Reproductive: Prostate is unremarkable. Other: No abdominal wall hernia or abnormality. No abdominopelvic ascites. No pneumoperitoneum. Musculoskeletal: No acute or significant osseous findings. IMPRESSION: 1. Terminal ileitis. 2. 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 3. Unchanged punctate nonobstructive bilateral nephrolithiasis. 4.  Aortic atherosclerosis (ICD10-I70.0). Electronically Signed   By: Titus Dubin M.D.   On: 08/07/2018 14:53   Dg Chest Portable 1 View  Result Date: 08/07/2018 CLINICAL DATA:  Lung cancer, weakness EXAM: PORTABLE CHEST 1 VIEW COMPARISON:  06/12/2018 FINDINGS: Right Port-A-Cath remains in place, unchanged. Heart is normal size. Right PleurX catheter remains in place. Right pleural effusion and/or pleural thickening has increased since prior study. No pneumothorax. Right lower lobe atelectasis or infiltrate. Left lung clear. IMPRESSION: Increasing pleural density on the right which could reflect pleural effusion and/or pleural thickening. No pneumothorax. Right lower lobe atelectasis or infiltrate. Electronically Signed   By: Rolm Baptise M.D.   On: 08/07/2018 12:42    Pending Labs Unresulted Labs (From admission, onward)    Start     Ordered   08/07/18 1156  Urine culture  ONCE - STAT,   STAT    Question:  Patient immune status  Answer:  Immunocompromised   08/07/18 1156   08/07/18 1155  Blood culture (routine x 2)  BLOOD CULTURE X 2,   STAT    Question:  Patient immune status  Answer:  Immunocompromised   08/07/18 1156   08/07/18 1148  Urinalysis, Routine w reflex microscopic  Once,   R     08/07/18 1148   Signed and Held  Magnesium  Tomorrow morning,   R     Signed and Held   Signed and Held  Phosphorus  Tomorrow morning,   R     Signed and Held   Signed and  Held  Comprehensive metabolic panel  Tomorrow morning,   R     Signed and Held   Signed and Held  CBC WITH DIFFERENTIAL  Tomorrow morning,   R     Signed and Held   Signed and Held  TSH  Once,   R     Signed and Held   Signed and Held  Urine culture  Once,   R     Signed and Held   Signed and Held  Hemoglobin A1c  Tomorrow morning,   R     Signed and Held   Signed and Held  Urinalysis, Complete w Microscopic  Once,   R     Signed and Held          Vitals/Pain Today's Vitals   08/07/18 1600 08/07/18 1630 08/07/18 1649 08/07/18 1700  BP: 126/69 126/71  133/65  Pulse: 88 88  87  Resp: 18 18    Temp:      TempSrc:      SpO2: 95% 93%  93%  Weight:      Height:      PainSc:   0-No pain     Isolation Precautions Droplet and Contact precautions  Medications Medications  sodium chloride (PF) 0.9 % injection (has no administration in time range)  Tbo-Filgrastim (GRANIX) injection 480 mcg (has no administration in time range)  vancomycin (VANCOCIN) IVPB 1000 mg/200 mL premix (has no administration in time range)  potassium chloride 10 mEq in 100 mL IVPB (has no administration in time range)  potassium chloride SA (K-DUR) CR tablet 40 mEq (has no administration in time range)  sodium chloride 0.9 % bolus 1,000 mL (0 mLs Intravenous Stopped 08/07/18 1359)  ondansetron (ZOFRAN) injection 4 mg (4 mg Intravenous Given 08/07/18 1227)  HYDROmorphone (DILAUDID) injection 1 mg (1 mg Intravenous Given 08/07/18 1227)  potassium chloride 10 mEq in 100 mL IVPB (0 mEq Intravenous Stopped 08/07/18 1621)  potassium chloride SA (K-DUR) CR tablet 40 mEq (40 mEq Oral Given 08/07/18 1325)  ceFEPIme (MAXIPIME) 2 g in sodium chloride 0.9 % 100 mL IVPB (0 g Intravenous Stopped 08/07/18 1409)  vancomycin (VANCOCIN) 2,000 mg in sodium chloride 0.9 % 500 mL IVPB (0 mg Intravenous Stopped 08/07/18 1648)  iohexol (OMNIPAQUE) 300 MG/ML solution 100 mL (100 mLs Intravenous Contrast Given 08/07/18 1427)     Mobility walks Low fall risk      R Recommendations: See Admitting Provider Note  Report given to:   Additional Notes:

## 2018-08-07 NOTE — ED Notes (Addendum)
CRITICAL VALUE STICKER  CRITICAL VALUE: potassium 2.5  RECEIVER (on-site recipient of call): Reeves Musick, RN  DATE & TIME NOTIFIED: 5/26 1303  MD NOTIFIED: Dr. Venora Maples  TIME OF NOTIFICATION: 5/26 1304

## 2018-08-07 NOTE — ED Notes (Signed)
Bed: WA08 Expected date:  Expected time:  Means of arrival:  Comments: EMS 58 yo [place] pt , weakness

## 2018-08-07 NOTE — ED Notes (Signed)
Pt informed that urine collection is needed. Urinal at bedside

## 2018-08-07 NOTE — Telephone Encounter (Signed)
FYI "Natividad Brood RN [phone]).  EMS arrived within two minutes while I called at [name] request to go to the hospital.  I am his home health nurse that drains the PleuRx.  Today he was tachycardic (P= 120), pale, nauseated, fever (T = 99.5).  Rash and edema to skin have gone but he looks almost jaundiced.  Let the PA and nurse know Ad Guttman said "I'll go to the ED) is why I called 911."  [facility] notified of call.

## 2018-08-08 DIAGNOSIS — D701 Agranulocytosis secondary to cancer chemotherapy: Secondary | ICD-10-CM

## 2018-08-08 DIAGNOSIS — R5081 Fever presenting with conditions classified elsewhere: Secondary | ICD-10-CM

## 2018-08-08 DIAGNOSIS — T451X5A Adverse effect of antineoplastic and immunosuppressive drugs, initial encounter: Secondary | ICD-10-CM

## 2018-08-08 DIAGNOSIS — C3491 Malignant neoplasm of unspecified part of right bronchus or lung: Secondary | ICD-10-CM

## 2018-08-08 DIAGNOSIS — D709 Neutropenia, unspecified: Secondary | ICD-10-CM

## 2018-08-08 LAB — COMPREHENSIVE METABOLIC PANEL
ALT: 23 U/L (ref 0–44)
AST: 25 U/L (ref 15–41)
Albumin: 2.4 g/dL — ABNORMAL LOW (ref 3.5–5.0)
Alkaline Phosphatase: 75 U/L (ref 38–126)
Anion gap: 7 (ref 5–15)
BUN: 9 mg/dL (ref 6–20)
CO2: 28 mmol/L (ref 22–32)
Calcium: 7 mg/dL — ABNORMAL LOW (ref 8.9–10.3)
Chloride: 98 mmol/L (ref 98–111)
Creatinine, Ser: 0.82 mg/dL (ref 0.61–1.24)
GFR calc Af Amer: >60 mL/min (ref >60)
GFR calc non Af Amer: >60 mL/min (ref >60)
Glucose, Bld: 111 mg/dL — ABNORMAL HIGH (ref 70–99)
Potassium: 3.2 mmol/L — ABNORMAL LOW (ref 3.5–5.1)
Sodium: 133 mmol/L — ABNORMAL LOW (ref 135–145)
Total Bilirubin: 0.9 mg/dL (ref 0.3–1.2)
Total Protein: 5.3 g/dL — ABNORMAL LOW (ref 6.5–8.1)

## 2018-08-08 LAB — CBC WITH DIFFERENTIAL/PLATELET
Abs Immature Granulocytes: 0.05 10*3/uL (ref 0.00–0.07)
Basophils Absolute: 0 10*3/uL (ref 0.0–0.1)
Basophils Relative: 1 %
Eosinophils Absolute: 0 10*3/uL (ref 0.0–0.5)
Eosinophils Relative: 2 %
HCT: 27.3 % — ABNORMAL LOW (ref 39.0–52.0)
Hemoglobin: 9.1 g/dL — ABNORMAL LOW (ref 13.0–17.0)
Immature Granulocytes: 3 %
Lymphocytes Relative: 40 %
Lymphs Abs: 0.6 10*3/uL — ABNORMAL LOW (ref 0.7–4.0)
MCH: 28.7 pg (ref 26.0–34.0)
MCHC: 33.3 g/dL (ref 30.0–36.0)
MCV: 86.1 fL (ref 80.0–100.0)
Monocytes Absolute: 0.2 10*3/uL (ref 0.1–1.0)
Monocytes Relative: 15 %
Neutro Abs: 0.6 10*3/uL — ABNORMAL LOW (ref 1.7–7.7)
Neutrophils Relative %: 39 %
Platelets: 77 10*3/uL — ABNORMAL LOW (ref 150–400)
RBC: 3.17 MIL/uL — ABNORMAL LOW (ref 4.22–5.81)
RDW: 13.2 % (ref 11.5–15.5)
WBC: 1.5 10*3/uL — ABNORMAL LOW (ref 4.0–10.5)
nRBC: 0 % (ref 0.0–0.2)

## 2018-08-08 LAB — URINALYSIS, COMPLETE (UACMP) WITH MICROSCOPIC
Bilirubin Urine: NEGATIVE
Glucose, UA: NEGATIVE mg/dL
Ketones, ur: NEGATIVE mg/dL
Leukocytes,Ua: NEGATIVE
Nitrite: NEGATIVE
Protein, ur: NEGATIVE mg/dL
Specific Gravity, Urine: 1.025 (ref 1.005–1.030)
pH: 5 (ref 5.0–8.0)

## 2018-08-08 LAB — HEMOGLOBIN A1C
Hgb A1c MFr Bld: 6 % — ABNORMAL HIGH (ref 4.8–5.6)
Mean Plasma Glucose: 125.5 mg/dL

## 2018-08-08 LAB — PHOSPHORUS: Phosphorus: 1.2 mg/dL — ABNORMAL LOW (ref 2.5–4.6)

## 2018-08-08 LAB — MAGNESIUM: Magnesium: 1.4 mg/dL — ABNORMAL LOW (ref 1.7–2.4)

## 2018-08-08 MED ORDER — AQUAPHOR EX OINT
TOPICAL_OINTMENT | Freq: Two times a day (BID) | CUTANEOUS | Status: DC
  Administered 2018-08-08 – 2018-08-10 (×4): via TOPICAL
  Filled 2018-08-08: qty 50

## 2018-08-08 MED ORDER — POTASSIUM CHLORIDE CRYS ER 20 MEQ PO TBCR
40.0000 meq | EXTENDED_RELEASE_TABLET | Freq: Three times a day (TID) | ORAL | Status: AC
  Administered 2018-08-08 (×2): 40 meq via ORAL
  Filled 2018-08-08 (×2): qty 2

## 2018-08-08 MED ORDER — MAGNESIUM SULFATE 2 GM/50ML IV SOLN
2.0000 g | Freq: Once | INTRAVENOUS | Status: AC
  Administered 2018-08-08: 2 g via INTRAVENOUS
  Filled 2018-08-08: qty 50

## 2018-08-08 NOTE — Progress Notes (Signed)
PT Cancellation Note  Patient Details Name: Sabin Gibeault MRN: [id number] DOB: 13-Aug-1960   Cancelled Treatment:    Reason Eval/Treat Not Completed: PT screened, no needs identified, will sign off. Spoke with pt who denied need for PT services. He stated he has been walking to/from bathroom without assistance. Encouraged pt to mobilize as able. Will sign off at pt's request.    Weston Anna, PT Acute Rehabilitation Services Pager: [phone] Office: [phone]

## 2018-08-08 NOTE — Progress Notes (Addendum)
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  CT abdomen was done in the emergency department and he was diagnosed with terminal ileitis.  He was also found to have significant pancytopenia and was also diagnosed with neutropenic fever.  He was admitted under hospitalist service and oncology as well as GI was consulted and he was started on IV cefepime and vancomycin.  Consultants:   GI  Oncology  Procedures:   None  Antimicrobials:   IV cefepime and vancomycin started on 08/07/2018   Subjective: Patient seen and examined.  He is alert and oriented.  He states that he feels better and did not have any complaint.  Objective: Vitals:   08/07/18 1738 08/07/18 2107 08/08/18 0501 08/08/18 0814  BP: (!) 153/93 128/78 132/82   Pulse: 97 91 86   Resp: 18 20 17    Temp: 98.5 F (36.9 C) 99.6 F (37.6 C) 98.9 F (37.2 C)   TempSrc: Oral     SpO2: 96% 97% 96% 94%  Weight:   103.9 kg   Height:        Intake/Output Summary (Last 24 hours) at 08/08/2018 0910 Last data filed at 08/08/2018 0500 Gross per 24 hour  Intake 1195.02 ml  Output -  Net 1195.02 ml   Filed Weights   08/07/18 1147 08/08/18 0501  Weight: 104.3 kg 103.9 kg    Examination:  General exam: Appears calm and comfortable, morbidly obese Respiratory system: Clear to auscultation. Respiratory effort normal. Cardiovascular system: S1 & S2 heard, RRR. No JVD, murmurs, rubs, gallops or clicks. No pedal edema. Gastrointestinal system:  Abdomen is nondistended, soft and nontender. No organomegaly or masses felt. Normal bowel sounds heard. Central nervous system: Alert and oriented. No focal neurological deficits. Extremities: Symmetric 5 x 5 power. Skin: Maculopapular rash involving all extremities.  Has popped a blister on the lateral side of the left foot/heel and has intact blister on the palmar side of the right foot Psychiatry: Judgement and insight appear normal. Mood & affect appropriate.    Data Reviewed: I have personally reviewed following labs and imaging studies  CBC: Recent Labs  Lab 08/07/18 1148 08/08/18 0601  WBC 1.0* 1.5*  NEUTROABS 0.1* 0.6*  HGB 10.3* 9.1*  HCT 29.6* 27.3*  MCV 84.3 86.1  PLT 65* 77*   Basic Metabolic Panel: Recent Labs  Lab 08/07/18 1148 08/08/18 0601  NA 133* 133*  K 2.5* 3.2*  CL 92* 98  CO2 26 28  GLUCOSE 95 111*  BUN 14 9  CREATININE 0.92 0.82  CALCIUM 7.8* 7.0*  MG  --  1.4*  PHOS  --  1.2*   GFR: Estimated Creatinine Clearance: 119.4 mL/min (by C-G formula based on SCr of 0.82 mg/dL). Liver Function Tests: Recent Labs  Lab 08/07/18 1148 08/08/18 0601  AST 25 25  ALT 25 23  ALKPHOS 87 75  BILITOT 1.2 0.9  PROT 6.6 5.3*  ALBUMIN 3.0* 2.4*   Recent Labs  Lab 08/07/18 1148  LIPASE 29   No results for input(s): AMMONIA in the  last 168 hours. Coagulation Profile: No results for input(s): INR, PROTIME in the last 168 hours. Cardiac Enzymes: No results for input(s): CKTOTAL, CKMB, CKMBINDEX, TROPONINI in the last 168 hours. BNP (last 3 results) No results for input(s): PROBNP in the last 8760 hours. HbA1C: No results for input(s): HGBA1C in the last 72 hours. CBG: No results for input(s): GLUCAP in the last 168 hours. Lipid Profile: No results for input(s): CHOL, HDL, LDLCALC, TRIG, CHOLHDL, LDLDIRECT in the last 72 hours. Thyroid Function Tests: Recent Labs    08/07/18 1837  TSH 1.331   Anemia Panel: No results for input(s): VITAMINB12,  FOLATE, FERRITIN, TIBC, IRON, RETICCTPCT in the last 72 hours. Sepsis Labs: No results for input(s): PROCALCITON, LATICACIDVEN in the last 168 hours.  Recent Results (from the past 240 hour(s))  SARS Coronavirus 2 (CEPHEID- Performed in [place] hospital lab), Hosp Order     Status: None   Collection Time: 08/07/18 11:56 AM  Result Value Ref Range Status   SARS Coronavirus 2 NEGATIVE NEGATIVE Final    Comment: (NOTE) If result is NEGATIVE SARS-CoV-2 target nucleic acids are NOT DETECTED. The SARS-CoV-2 RNA is generally detectable in upper and lower  respiratory specimens during the acute phase of infection. The lowest  concentration of SARS-CoV-2 viral copies this assay can detect is 250  copies / mL. A negative result does not preclude SARS-CoV-2 infection  and should not be used as the sole basis for treatment or other  patient management decisions.  A negative result may occur with  improper specimen collection / handling, submission of specimen other  than nasopharyngeal swab, presence of viral mutation(s) within the  areas targeted by this assay, and inadequate number of viral copies  (<250 copies / mL). A negative result must be combined with clinical  observations, patient history, and epidemiological information. If result is POSITIVE SARS-CoV-2 target nucleic acids are DETECTED. The SARS-CoV-2 RNA is generally detectable in upper and lower  respiratory specimens dur ing the acute phase of infection.  Positive  results are indicative of active infection with SARS-CoV-2.  Clinical  correlation with patient history and other diagnostic information is  necessary to determine patient infection status.  Positive results do  not rule out bacterial infection or co-infection with other viruses. If result is PRESUMPTIVE POSTIVE SARS-CoV-2 nucleic acids MAY BE PRESENT.   A presumptive positive result was obtained on the submitted specimen  and confirmed on repeat testing.  While  2019 novel coronavirus  (SARS-CoV-2) nucleic acids may be present in the submitted sample  additional confirmatory testing may be necessary for epidemiological  and / or clinical management purposes  to differentiate between  SARS-CoV-2 and other Sarbecovirus currently known to infect humans.  If clinically indicated additional testing with an alternate test  methodology [phone]) is advised. The SARS-CoV-2 RNA is generally  detectable in upper and lower respiratory sp ecimens during the acute  phase of infection. The expected result is Negative. Fact Sheet for Patients:  StrictlyIdeas.no Fact Sheet for Healthcare Providers: BankingDealers.co.za This test is not yet approved or cleared by the [place] FDA and has been authorized for detection and/or diagnosis of SARS-CoV-2 by FDA under an Emergency Use Authorization (EUA).  This EUA will remain in effect (meaning this test can be used) for the duration of the COVID-19 declaration under Section 564(b)(1) of the Act, 21 U.S.C. section 360bbb-3(b)(1), unless the authorization is terminated or revoked sooner. Performed at [facility], [address] [place],  [place] [address]       Radiology Studies: Ct Abdomen Pelvis W Contrast  Result Date: 08/07/2018 CLINICAL DATA:  Weakness, nausea, and diarrhea for the past week. Intermittent hematochezia. History of lung cancer. EXAM: CT ABDOMEN AND PELVIS WITH CONTRAST TECHNIQUE: Multidetector CT imaging of the abdomen and pelvis was performed using the standard protocol following bolus administration of intravenous contrast. CONTRAST:  119mL OMNIPAQUE IOHEXOL 300 MG/ML  SOLN COMPARISON:  PET-CT dated July 10, 2018. FINDINGS: Lower chest: Right-sided PleurX catheter again noted with trace loculated effusion. Right-sided pleural and mediastinal nodularity is grossly unchanged. Unchanged 9 x 8 mm nodule in the right lower lobe.  Hepatobiliary: Unchanged small simple cyst in the hepatic dome. No other focal liver abnormality. The gallbladder is unremarkable. No biliary dilatation. Pancreas: Unremarkable. No pancreatic ductal dilatation or surrounding inflammatory changes. Spleen: Normal in size without focal abnormality. Adrenals/Urinary Tract: The adrenal glands are unremarkable. No focal renal lesion. Unchanged punctate bilateral nonobstructive renal calculi. No hydronephrosis. The bladder is unremarkable. Stomach/Bowel: The stomach is within normal limits. There is mild circumferential wall thickening of the terminal ileum the remaining small bowel and colon are unremarkable. Normal appendix. Vascular/Lymphatic: Aortic atherosclerosis. No enlarged abdominal or pelvic lymph nodes. Reproductive: Prostate is unremarkable. Other: No abdominal wall hernia or abnormality. No abdominopelvic ascites. No pneumoperitoneum. Musculoskeletal: No acute or significant osseous findings. IMPRESSION: 1. Terminal ileitis. 2. 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 3. Unchanged punctate nonobstructive bilateral nephrolithiasis. 4.  Aortic atherosclerosis (ICD10-I70.0). Electronically Signed   By: Titus Dubin M.D.   On: 08/07/2018 14:53   Dg Chest Portable 1 View  Result Date: 08/07/2018 CLINICAL DATA:  Lung cancer, weakness EXAM: PORTABLE CHEST 1 VIEW COMPARISON:  06/12/2018 FINDINGS: Right Port-A-Cath remains in place, unchanged. Heart is normal size. Right PleurX catheter remains in place. Right pleural effusion and/or pleural thickening has increased since prior study. No pneumothorax. Right lower lobe atelectasis or infiltrate. Left lung clear. IMPRESSION: Increasing pleural density on the right which could reflect pleural effusion and/or pleural thickening. No pneumothorax. Right lower lobe atelectasis or infiltrate. Electronically Signed   By: Rolm Baptise M.D.   On: 08/07/2018 12:42    Scheduled Meds: . aspirin EC   81 mg Oral Daily  . atorvastatin  40 mg Oral QHS  . cholecalciferol  1,000 Units Oral Daily  . donepezil  5 mg Oral QHS  . escitalopram  20 mg Oral Daily  . folic acid  1 mg Oral Daily  . mineral oil-hydrophilic petrolatum   Topical BID  . pantoprazole  40 mg Oral Daily  . potassium chloride  40 mEq Oral TID  . vitamin B-12  500 mcg Oral Daily   Continuous Infusions: . 0.9 % NaCl with KCl 40 mEq / L 100 mL/hr (08/08/18 0815)  . ceFEPime (MAXIPIME) IV 200 mL/hr at 08/08/18 0500  . magnesium sulfate bolus IVPB    . vancomycin Stopped (08/08/18 0323)     LOS: 1 day   Assessment & Plan:   Active Problems:   Hypokalemia   High cholesterol   Hypertension   Malignant pleural effusion   Pacemaker   Recurrent pleural effusion on right   Drug-induced skin rash   Neutropenic fever (HCC)   Neutropenic typhlitis   Antineoplastic chemotherapy induced pancytopenia (HCC)   Hyponatremia  Neutropenic fever with pancytopenia: ANC was 100 upon presentation.  He continues to be on Granix 40 mcg daily along with IV cefepime and vancomycin.  Follow daily CBC.  Oncology  to see patient and we will defer further management to them.  All the cultures are negative and we will follow them.  Neutropenic enterocolitis/typhlitis: CT abdomen and pelvis shows terminal ileitis.  Case was discussed between on-call GI and admitting hospitalist service and they are working diagnosis is neutropenic enterocolitis. patient was started on cefepime and vancomycin for that.  GI to see patient today.  He is on clear liquid diet.  Hypokalemia: Likely due to diarrhea.  Came in with 2.5.  Currently 3.2.  Will replace more through oral and he is also getting through IV infusion.  Recheck in the morning.  Hypomagnesemia: 1. 4.  Will transfuse magnesium sulfate and recheck in the morning.  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  Oncology to see patient.  Defer further management to them.  Hyperlipidemia: Continue atorvastatin.  Obstructive sleep apnea: None on CPAP  GERD: Continue omeprazole  SSS status post pacemaker: Continue aspirin and monitor on telemetry.  Hypertension: Lisinopril on hold.  Blood pressure controlled.  COPD: None in exacerbation.  Continue albuterol as needed.  Diffuse maculopapular drug-induced rash: Continue topical cream.  Wound care on board.   DVT prophylaxis: SCD Code Status: Full code Family Communication: Discussed with patient Disposition Plan: Pending clinical improvement   Time spent: 40 minutes   Darliss Cheney, MD [facility] Pager [phone]  If 7PM-7AM, please contact night-coverage [email or web address] Password [email or web address] 08/08/2018, 9:10 AM

## 2018-08-08 NOTE — Evaluation (Addendum)
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  CT abdomen was done in the emergency department and he was diagnosed with terminal ileitis.  He was also found to have significant pancytopenia and was also diagnosed with neutropenic fever.  He was admitted under hospitalist service and oncology as well as GI consulted.   Clinical Impression   This 3/ yo male presents with the above. PTA pt reports independence with ADL and functional mobility. Pt performing room level without AD as well as pushing iv pole at supervision level. He currently requires supervision for toileting and standing grooming ADL, requires minA for LB ADL secondary to difficulty accessing feet at this time. Pt also with reports of back pain/generalized pain with mobility/ADL tasks this session. He will benefit from continued acute OT services to further address LB ADL and to maximize his overall safety and independence with ADL and mobility piror to discharge home. Do not anticipate pt will require follow up OT services after discharge.     Follow Up Recommendations  No OT follow up;Supervision - Intermittent    Equipment Recommendations  None recommended by OT    Recommendations for Other Services       Precautions / Restrictions Precautions Precautions: None Restrictions Weight Bearing Restrictions: No      Mobility Bed Mobility Overal bed mobility: Needs Assistance Bed Mobility: Supine to Sit;Sit to Supine      Supine to sit: Min guard Sit to supine: Supervision   General bed mobility comments: increased time/effort to transition to EOB, no physical assist required; supervision for lines  Transfers Overall transfer level: Needs assistance Equipment used: None Transfers: Sit to/from Stand Sit to Stand: Supervision         General transfer comment: for general safety and immediate standing balance, no physical assist required    Balance Overall balance assessment: No apparent balance deficits (not formally assessed)                                         ADL either performed or assessed with clinical judgement   ADL Overall ADL's : Needs assistance/impaired Eating/Feeding: Modified independent;Sitting Eating/Feeding Details (indicate cue type and reason): clear liquids only at this time Grooming: Wash/dry hands;Supervision/safety;Standing   Upper Body Bathing: Supervision/ safety;Sitting   Lower Body Bathing: Minimal assistance;Sit to/from stand   Upper Body Dressing : Set up;Supervision/safety;Sitting   Lower Body Dressing: Minimal assistance;Sit to/from stand Lower Body Dressing Details (indicate cue type and reason): pt requires assist to don socks this AM; minguard to supervision for standing balance Toilet Transfer: Min guard;Supervision/safety;Ambulation;Regular Toilet   Toileting- Clothing Manipulation and Hygiene: Supervision/safety;Min guard;Sit to/from stand Toileting - Clothing Manipulation Details (indicate cue type and reason): pt performing pericare and clothing management (underwear and gown) without assist      Functional mobility during ADLs: Min guard;Supervision/safety       Vision         Perception  Praxis      Pertinent Vitals/Pain Pain Assessment: Faces Faces Pain Scale: Hurts little more Pain Location: back, generalized Pain Descriptors / Indicators: Discomfort;Guarding;Moaning Pain Intervention(s): Limited activity  within patient's tolerance;Monitored during session;Repositioned     Hand Dominance     Extremity/Trunk Assessment Upper Extremity Assessment Upper Extremity Assessment: Overall WFL for tasks assessed   Lower Extremity Assessment Lower Extremity Assessment: Defer to PT evaluation       Communication Communication Communication: No difficulties   Cognition Arousal/Alertness: Awake/alert Behavior During Therapy: WFL for tasks assessed/performed Overall Cognitive Status: No family/caregiver present to determine baseline cognitive functioning                                 General Comments: pt disoriented to month, noted mild impulsivities, suspect likely baseline   General Comments       Exercises     Shoulder Instructions      Home Living Family/patient expects to be discharged to:: Private residence Living Arrangements: Alone Available Help at Discharge: Friend(s);Available PRN/intermittently(home health nurse) Type of Home: House Home Access: Stairs to enter Entrance Stairs-Number of Steps: 3 Entrance Stairs-Rails: None Home Layout: One level     Bathroom Shower/Tub: Occupational psychologist: Standard     Home Equipment: None   Additional Comments: pt reports he lives in a "tiny house community"      Prior Functioning/Environment Level of Independence: Independent        Comments: has home health nurse, reports he no longer drives        OT Problem List: Decreased activity tolerance;Decreased strength;Decreased safety awareness;Pain;Obesity      OT Treatment/Interventions: Self-care/ADL training;Therapeutic exercise;DME and/or AE instruction;Therapeutic activities;Patient/family education;Balance training;Cognitive remediation/compensation    OT Goals(Current goals can be found in the care plan section) Acute Rehab OT Goals Patient Stated Goal: home soon OT Goal Formulation: With patient Time For Goal Achievement:  08/22/18 Potential to Achieve Goals: Good  OT Frequency: Min 2X/week   Barriers to D/C:            Co-evaluation              AM-PAC OT "6 Clicks" Daily Activity     Outcome Measure Help from another person eating meals?: None Help from another person taking care of personal grooming?: None Help from another person toileting, which includes using toliet, bedpan, or urinal?: None Help from another person bathing (including washing, rinsing, drying)?: A Little Help from another person to put on and taking off regular upper body clothing?: None Help from another person to put on and taking off regular lower body clothing?: A Little 6 Click Score: 22   End of Session Nurse Communication: Mobility status  Activity Tolerance: Patient tolerated treatment well Patient left: in bed;with call bell/phone within reach;with nursing/sitter in room  OT Visit Diagnosis: Muscle weakness (generalized) (M62.81);Pain Pain - part of body: (back, generalized)                Time: 2122-4825 OT Time Calculation (min): 23 min Charges:  OT General Charges $OT Visit: 1 Visit OT Evaluation $OT Eval Moderate Complexity: Young Harris, OT [employer] Pager [phone] Office [phone]   Raymondo Band 08/08/2018, 9:37 AM

## 2018-08-08 NOTE — Progress Notes (Signed)
HEMATOLOGY-ONCOLOGY PROGRESS NOTE  SUBJECTIVE: Daniel Bryant is a 58 year old male with Stage IV non-small cell lung cancer, adenocarcinoma. He has been receiving chemotherapy with Carboplatin for an AUC of 5, Alimta 500 mg/m2, and Keytruda every 3 weeks; status post 2 cycles. Last dose given on 07/24/2018 - Keytruda was held on 5/12 secondary to skin rash. He was advised at this last appointment to take Prednisone for his rash, but review of his records indicate that he never picked up his prescription. He was seen in the ER for the rash on 5/18 and he was advised at that time to take his previously ordered Prednisone.   The patient developed weakness, nausea, vomiting, diarrhea, and abdominal pain and presented to the ER. CT of the abdomen and pelvis was performed with showed terminal ileitis. CXR showed increasing pleural density on the right which could reflect pleural effusion and/or pleural thickening. No pneumothorax. Right lower lobe atelectasis or infiltrate. He was found to have pancytopenia and started on Granix and antibiotics. Cultures obtained. He also had sever hypokalemia.  When seen today, the patient reports that he feels better. Still having diarrhea and nausea. No vomiting.  T-Max 99.6. Has ongoing rash and itching. He is unable to tell me if he ever took his Prednisone. Denies chest pain, shortness of breath, cough, hemoptysis. No bleeding.     Adenocarcinoma of right lung ([place])   05/02/2018 Initial Diagnosis    Adenocarcinoma of right lung ([place])    07/04/2018 -  Chemotherapy    The patient had palonosetron (ALOXI) injection 0.25 mg, 0.25 mg, Intravenous,  Once, 2 of 4 cycles Administration: 0.25 mg (07/04/2018), 0.25 mg (07/24/2018) PEMEtrexed (ALIMTA) 1,200 mg in sodium chloride 0.9 % 100 mL chemo infusion, 510 mg/m2 = 1,175 mg, Intravenous,  Once, 2 of 9 cycles Administration: 1,200 mg (07/04/2018), 1,200 mg (07/24/2018) CARBOplatin (PARAPLATIN) 750 mg in sodium chloride 0.9 % 250 mL  chemo infusion, 750 mg (100 % of original dose 750 mg), Intravenous,  Once, 2 of 4 cycles Dose modification: 750 mg (original dose 750 mg, Cycle 1), 665.5 mg (original dose 665.5 mg, Cycle 2) Administration: 750 mg (07/04/2018), 670 mg (07/24/2018) pembrolizumab (KEYTRUDA) 200 mg in sodium chloride 0.9 % 50 mL chemo infusion, 200 mg, Intravenous, Once, 1 of 8 cycles Administration: 200 mg (07/04/2018) fosaprepitant (EMEND) 150 mg, dexamethasone (DECADRON) 12 mg in sodium chloride 0.9 % 145 mL IVPB, , Intravenous,  Once, 2 of 4 cycles Administration:  (07/04/2018),  (07/24/2018)  for chemotherapy treatment.       REVIEW OF SYSTEMS:   Constitutional: Denies fevers, chills Eyes: Denies blurriness of vision Ears, nose, mouth, throat, and face: Denies mucositis or sore throat Respiratory: Denies cough, dyspnea or wheezes Cardiovascular: Denies palpitation, chest discomfort Gastrointestinal: reports nausea and diarrhea Skin: Reports rash to entire body with itching.  Lymphatics: Denies new lymphadenopathy or easy bruising Neurological:Denies numbness, tingling or new weaknesses Behavioral/Psych: Mood is stable, no new changes  Extremities: No lower extremity edema All other systems were reviewed with the patient and are negative.  I have reviewed the past medical history, past surgical history, social history and family history with the patient and they are unchanged from previous note.   PHYSICAL EXAMINATION: ECOG PERFORMANCE STATUS: 1 - Symptomatic but completely ambulatory  Vitals:   08/08/18 0501 08/08/18 0814  BP: 132/82   Pulse: 86   Resp: 17   Temp: 98.9 F (37.2 C)   SpO2: 96% 94%   [employer]  08/07/18 1147 08/08/18 0501  Weight: 230 lb (104.3 kg) 229 lb 0.9 oz (103.9 kg)    Intake/Output from previous day: 05/26 0701 - 05/27 0700 In: 4403 [P.O.:240; I.V.:652.9; IV Piggyback:302.1] Out: -   GENERAL:alert, no distress and comfortable SKIN: maculopapular rash to  body EYES: normal, Conjunctiva are pink and non-injected, sclera clear OROPHARYNX:no exudate, no erythema and lips, buccal mucosa, and tongue normal  NECK: supple, thyroid normal size, non-tender, without nodularity LYMPH:  no palpable lymphadenopathy in the cervical, axillary or inguinal LUNGS: Diminished on right. Pleurx to right chest.  HEART: regular rate & rhythm and no murmurs and no lower extremity edema ABDOMEN:abdomen soft, non-tender and normal bowel sounds Musculoskeletal:no cyanosis of digits and no clubbing  NEURO: alert & oriented x 3 with fluent speech, no focal motor/sensory deficits  LABORATORY DATA:  I have reviewed the data as listed CMP Latest Ref Rng & Units 08/08/2018 08/07/2018 07/31/2018  Glucose 70 - 99 mg/dL 111(H) 95 97  BUN 6 - 20 mg/dL 9 14 15   Creatinine 0.61 - 1.24 mg/dL 0.82 0.92 0.83  Sodium 135 - 145 mmol/L 133(L) 133(L) 140  Potassium 3.5 - 5.1 mmol/L 3.2(L) 2.5(LL) 3.2(L)  Chloride 98 - 111 mmol/L 98 92(L) 101  CO2 22 - 32 mmol/L 28 26 28   Calcium 8.9 - 10.3 mg/dL 7.0(L) 7.8(L) 8.3(L)  Total Protein 6.5 - 8.1 g/dL 5.3(L) 6.6 6.3(L)  Total Bilirubin 0.3 - 1.2 mg/dL 0.9 1.2 0.6  Alkaline Phos 38 - 126 U/L 75 87 96  AST 15 - 41 U/L 25 25 50(H)  ALT 0 - 44 U/L 23 25 42    Lab Results  Component Value Date   WBC 1.5 (L) 08/08/2018   HGB 9.1 (L) 08/08/2018   HCT 27.3 (L) 08/08/2018   MCV 86.1 08/08/2018   PLT 77 (L) 08/08/2018   NEUTROABS 0.6 (L) 08/08/2018    Ct Abdomen Pelvis W Contrast  Result Date: 08/07/2018 CLINICAL DATA:  Weakness, nausea, and diarrhea for the past week. Intermittent hematochezia. History of lung cancer. EXAM: CT ABDOMEN AND PELVIS WITH CONTRAST TECHNIQUE: Multidetector CT imaging of the abdomen and pelvis was performed using the standard protocol following bolus administration of intravenous contrast. CONTRAST:  137mL OMNIPAQUE IOHEXOL 300 MG/ML  SOLN COMPARISON:  PET-CT dated July 10, 2018. FINDINGS: Lower chest:  Right-sided PleurX catheter again noted with trace loculated effusion. Right-sided pleural and mediastinal nodularity is grossly unchanged. Unchanged 9 x 8 mm nodule in the right lower lobe. Hepatobiliary: Unchanged small simple cyst in the hepatic dome. No other focal liver abnormality. The gallbladder is unremarkable. No biliary dilatation. Pancreas: Unremarkable. No pancreatic ductal dilatation or surrounding inflammatory changes. Spleen: Normal in size without focal abnormality. Adrenals/Urinary Tract: The adrenal glands are unremarkable. No focal renal lesion. Unchanged punctate bilateral nonobstructive renal calculi. No hydronephrosis. The bladder is unremarkable. Stomach/Bowel: The stomach is within normal limits. There is mild circumferential wall thickening of the terminal ileum the remaining small bowel and colon are unremarkable. Normal appendix. Vascular/Lymphatic: Aortic atherosclerosis. No enlarged abdominal or pelvic lymph nodes. Reproductive: Prostate is unremarkable. Other: No abdominal wall hernia or abnormality. No abdominopelvic ascites. No pneumoperitoneum. Musculoskeletal: No acute or significant osseous findings. IMPRESSION: 1. Terminal ileitis. 2. 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 3. Unchanged punctate nonobstructive bilateral nephrolithiasis. 4.  Aortic atherosclerosis (ICD10-I70.0). Electronically Signed   By: Titus Dubin M.D.   On: 08/07/2018 14:53   Nm Pet Image Initial (pi) Skull Base To Thigh  Result Date:  07/10/2018 CLINICAL DATA:  Subsequent treatment strategy for lung carcinoma non-small cell lung cancer. EXAM: NUCLEAR MEDICINE PET SKULL BASE TO THIGH TECHNIQUE: 10.6 mCi F-18 FDG was injected intravenously. Full-ring PET imaging was performed from the skull base to thigh after the radiotracer. CT data was obtained and used for attenuation correction and anatomic localization. Fasting blood glucose: 08/12/2000 mg/dl COMPARISON:  CT 2182  FINDINGS: Mediastinal blood pool activity: SUV max 2.7 NECK: Focal activity in the LEFT posterior oropharynx in the region of the LEFT palatine tonsil. Lesion is intensely hypermetabolic with SUV max equal 12.6. Activity localizes to an approximately 2 cm lesion. There is a hypermetabolic lymph node in the adjacent LEFT level 2 nodal station anterior to the LEFT sternocleidomastoid muscle with SUV max equal 9.1. This node measures 12 mm (image 33/4) Incidental CT findings: none CHEST: There multiple hypermetabolic nodules scattered throughout the RIGHT pleural space and extending into the anterior RIGHT mediastinum. Additionally discrete hypermetabolic nodules within the the pulmonary parenchyma which for the most part are associated with the fissures. For example nodule along the oblique fissure with SUV max equal 11.1. Nodules in the lateral pleural space RIGHT upper lobe SUV max equal 13.2. Nodules adjacent to the RIGHT atrium within the anterior aspect the RIGHT mediastinum with SUV max equal 12.3. Additionally there is intensely hypermetabolic RIGHT lower paratracheal lymph node SUV max equal 13.5. No hypermetabolic nodules or pleural thickening in the LEFT lung. Incidental CT findings: none ABDOMEN/PELVIS: Several small hypermetabolic retroperitoneal nodes in the upper abdomen adjacent to the aorta and IVC which are intense for size. For example node posterior the IVC with SUV max equal 7.3 measures approximately 4 mm. No abnormal activity in the liver. Adrenal glands are normal. No abnormal activity in the bowel. No pelvic adenopathy Incidental CT findings: none SKELETON: No focal hypermetabolic activity to suggest skeletal metastasis. Incidental CT findings: none IMPRESSION: 1. Multiple intensely hypermetabolic nodules in the right pleural space and extending along the fissures into the anterior mediastinum consistent with extensive pleural spread of bronchogenic carcinoma. 2. Hypermetabolic RIGHT lower  paratracheal metastatic lymph node. 3. Extension of adenopathy into the upper retroperitoneum adjacent the aorta and IVC. 4. Hypermetabolic focus in the left posterior oropharynx (LEFT tonsil) with associated hypermetabolic LEFT level II lymph node. Differential would include primary head neck cancer versus less likely tonsillitis or metastatic lung cancer. Recommend neck CT or direct visualization. Electronically Signed   By: Suzy Bouchard M.D.   On: 07/10/2018 16:26   Dg Chest Portable 1 View  Result Date: 08/07/2018 CLINICAL DATA:  Lung cancer, weakness EXAM: PORTABLE CHEST 1 VIEW COMPARISON:  06/12/2018 FINDINGS: Right Port-A-Cath remains in place, unchanged. Heart is normal size. Right PleurX catheter remains in place. Right pleural effusion and/or pleural thickening has increased since prior study. No pneumothorax. Right lower lobe atelectasis or infiltrate. Left lung clear. IMPRESSION: Increasing pleural density on the right which could reflect pleural effusion and/or pleural thickening. No pneumothorax. Right lower lobe atelectasis or infiltrate. Electronically Signed   By: Rolm Baptise M.D.   On: 08/07/2018 12:42    ASSESSMENT AND PLAN: This is a very pleasant 58 year old white male with metastatic non-small cell lung cancer, adenocarcinoma currently undergoing systemic chemotherapy with carboplatin, Alimta and Keytruda status post 2 cycles. [name] was held with cycle 2 secondary to rash.  He is now admitted with pancytopenia, entercolitis/typhlitis, hypokalemia, and hypomagnesemia.   CBC reviewed. WBC is coming up with Granix. Will plan to continue Granix until [facility]  is 1.5 or higher. No need for PRBC or platelet transfusion today. Tranfuse PRBC for Hgb <7.0 or active bleeding. Transfuse platelets for platelet count <20,000 or active bleeding.   Cultures negative to date. Continue antibiotics for now. De-escalate if cultures remain negative and he remains afebrile.   Continue Benadryl and  topical creams for rash. Patient may benefit from the Prednisone that was previously prescribed. I do not think that he ever took this.  Agree with GI consult.  K+ and Mg replacement per hospitalist.    LOS: 1 day   Mikey Bussing, DNP, AGPCNP-BC, AOCNP 08/08/18

## 2018-08-08 NOTE — Consult Note (Addendum)
[place] Nurse wound consult note Patient receiving care in [place].  At the current time, the FYIS box contains the following statement:  "POSSIBLE NOVEL CORONAVIRUS (2019-NCOV)". Reason for Consult: total body rash and blisters on feet Wound type: Drug induced erythematous patches on BLE, dry flaking; peeling skin on BUE; right plantar mid-foot with large maroon tinged fluid filled bulla. Ruptured bullae on right inter toe space 4/5, left great toe, and left lateral mid foot.  None of the areas on the feet have induration, erythema, or purulence. Dressing procedure/placement/frequency: Apply vaseline gauzes over intact and ruptured "blisters" on feet.  Secure with kerlex. Change daily. Aquaphor twice daily to total body rash areas and UEs. Monitor the wound area(s) for worsening of condition such as: Signs/symptoms of infection,  Increase in size,  Development of or worsening of odor, Development of pain, or increased pain at the affected locations.  Notify the medical team if any of these develop.  Thank you for the consult.  Discussed plan of care with the patient and bedside nurse.  [facility] nurse will not follow at this time.  Please re-consult the [facility] team if needed.  Val Riles, RN, MSN, CWOCN, CNS-BC, pager [phone]

## 2018-08-09 DIAGNOSIS — T451X5A Adverse effect of antineoplastic and immunosuppressive drugs, initial encounter: Secondary | ICD-10-CM | POA: Diagnosis present

## 2018-08-09 DIAGNOSIS — D701 Agranulocytosis secondary to cancer chemotherapy: Secondary | ICD-10-CM | POA: Diagnosis present

## 2018-08-09 LAB — COMPREHENSIVE METABOLIC PANEL
ALT: 27 U/L (ref 0–44)
AST: 31 U/L (ref 15–41)
Albumin: 2.5 g/dL — ABNORMAL LOW (ref 3.5–5.0)
Alkaline Phosphatase: 91 U/L (ref 38–126)
Anion gap: 9 (ref 5–15)
BUN: 5 mg/dL — ABNORMAL LOW (ref 6–20)
CO2: 25 mmol/L (ref 22–32)
Calcium: 7.7 mg/dL — ABNORMAL LOW (ref 8.9–10.3)
Chloride: 100 mmol/L (ref 98–111)
Creatinine, Ser: 0.8 mg/dL (ref 0.61–1.24)
GFR calc Af Amer: >60 mL/min (ref >60)
GFR calc non Af Amer: >60 mL/min (ref >60)
Glucose, Bld: 111 mg/dL — ABNORMAL HIGH (ref 70–99)
Potassium: 3.3 mmol/L — ABNORMAL LOW (ref 3.5–5.1)
Sodium: 134 mmol/L — ABNORMAL LOW (ref 135–145)
Total Bilirubin: 0.6 mg/dL (ref 0.3–1.2)
Total Protein: 5.7 g/dL — ABNORMAL LOW (ref 6.5–8.1)

## 2018-08-09 LAB — CBC WITH DIFFERENTIAL/PLATELET
Abs Immature Granulocytes: 0.7 10*3/uL — ABNORMAL HIGH (ref 0.00–0.07)
Basophils Absolute: 0 10*3/uL (ref 0.0–0.1)
Basophils Relative: 0 %
Eosinophils Absolute: 0 10*3/uL (ref 0.0–0.5)
Eosinophils Relative: 0 %
HCT: 30 % — ABNORMAL LOW (ref 39.0–52.0)
Hemoglobin: 10.4 g/dL — ABNORMAL LOW (ref 13.0–17.0)
Lymphocytes Relative: 20 %
Lymphs Abs: 1.1 10*3/uL (ref 0.7–4.0)
MCH: 29.7 pg (ref 26.0–34.0)
MCHC: 34.7 g/dL (ref 30.0–36.0)
MCV: 85.7 fL (ref 80.0–100.0)
Metamyelocytes Relative: 2 %
Monocytes Absolute: 0.3 10*3/uL (ref 0.1–1.0)
Monocytes Relative: 5 %
Myelocytes: 9 %
Neutro Abs: 3.5 10*3/uL (ref 1.7–7.7)
Neutrophils Relative %: 62 %
Platelets: 167 10*3/uL (ref 150–400)
Promyelocytes Relative: 2 %
RBC: 3.5 MIL/uL — ABNORMAL LOW (ref 4.22–5.81)
RDW: 13.4 % (ref 11.5–15.5)
WBC: 5.6 10*3/uL (ref 4.0–10.5)
nRBC: 0 % (ref 0.0–0.2)

## 2018-08-09 LAB — GLUCOSE, CAPILLARY: Glucose-Capillary: 91 mg/dL (ref 70–99)

## 2018-08-09 LAB — URINE CULTURE: Culture: NO GROWTH

## 2018-08-09 MED ORDER — WITCH HAZEL-GLYCERIN EX PADS
MEDICATED_PAD | CUTANEOUS | Status: DC | PRN
  Filled 2018-08-09: qty 100

## 2018-08-09 MED ORDER — METRONIDAZOLE 500 MG PO TABS
500.0000 mg | ORAL_TABLET | Freq: Three times a day (TID) | ORAL | Status: DC
  Administered 2018-08-09 – 2018-08-10 (×4): 500 mg via ORAL
  Filled 2018-08-09 (×4): qty 1

## 2018-08-09 MED ORDER — POTASSIUM CHLORIDE CRYS ER 20 MEQ PO TBCR
40.0000 meq | EXTENDED_RELEASE_TABLET | Freq: Two times a day (BID) | ORAL | Status: AC
  Administered 2018-08-09 (×2): 40 meq via ORAL
  Filled 2018-08-09 (×2): qty 2

## 2018-08-09 MED ORDER — CIPROFLOXACIN HCL 500 MG PO TABS
500.0000 mg | ORAL_TABLET | Freq: Two times a day (BID) | ORAL | Status: DC
  Administered 2018-08-09 – 2018-08-10 (×3): 500 mg via ORAL
  Filled 2018-08-09 (×3): qty 1

## 2018-08-09 NOTE — Progress Notes (Signed)
HEMATOLOGY-ONCOLOGY PROGRESS NOTE  SUBJECTIVE: The patient is feeling better this morning.  Reports that his nausea and vomiting have resolved.  Reports intermittent diarrhea and has had about 2 episodes since yesterday.  Has been noted in his stool.  Remains afebrile.  Itching and rash are about the same.  He has no other complaints this morning.    Adenocarcinoma of right lung ([place])   05/02/2018 Initial Diagnosis    Adenocarcinoma of right lung ([place])    07/04/2018 -  Chemotherapy    The patient had palonosetron (ALOXI) injection 0.25 mg, 0.25 mg, Intravenous,  Once, 2 of 4 cycles Administration: 0.25 mg (07/04/2018), 0.25 mg (07/24/2018) PEMEtrexed (ALIMTA) 1,200 mg in sodium chloride 0.9 % 100 mL chemo infusion, 510 mg/m2 = 1,175 mg, Intravenous,  Once, 2 of 9 cycles Administration: 1,200 mg (07/04/2018), 1,200 mg (07/24/2018) CARBOplatin (PARAPLATIN) 750 mg in sodium chloride 0.9 % 250 mL chemo infusion, 750 mg (100 % of original dose 750 mg), Intravenous,  Once, 2 of 4 cycles Dose modification: 750 mg (original dose 750 mg, Cycle 1), 665.5 mg (original dose 665.5 mg, Cycle 2) Administration: 750 mg (07/04/2018), 670 mg (07/24/2018) pembrolizumab (KEYTRUDA) 200 mg in sodium chloride 0.9 % 50 mL chemo infusion, 200 mg, Intravenous, Once, 1 of 8 cycles Administration: 200 mg (07/04/2018) fosaprepitant (EMEND) 150 mg, dexamethasone (DECADRON) 12 mg in sodium chloride 0.9 % 145 mL IVPB, , Intravenous,  Once, 2 of 4 cycles Administration:  (07/04/2018),  (07/24/2018)  for chemotherapy treatment.       REVIEW OF SYSTEMS:   Constitutional: Denies fevers, chills Eyes: Denies blurriness of vision Ears, nose, mouth, throat, and face: Denies mucositis or sore throat Respiratory: Denies cough, dyspnea or wheezes Cardiovascular: Denies palpitation, chest discomfort Gastrointestinal: Nausea and vomiting have resolved.  Reports about 2 episodes of diarrhea in the past 24 hours. Skin: Reports rash to  entire body with itching.  Lymphatics: Denies new lymphadenopathy or easy bruising Neurological:Denies numbness, tingling or new weaknesses Behavioral/Psych: Mood is stable, no new changes  Extremities: No lower extremity edema All other systems were reviewed with the patient and are negative.  I have reviewed the past medical history, past surgical history, social history and family history with the patient and they are unchanged from previous note.   PHYSICAL EXAMINATION: ECOG PERFORMANCE STATUS: 1 - Symptomatic but completely ambulatory  Vitals:   08/08/18 2127 08/09/18 0548  BP: 140/63 129/69  Pulse: (!) 101 91  Resp: 16 14  Temp: 98.3 F (36.8 C) 98.4 F (36.9 C)  SpO2: 93% 96%   Filed Weights   08/07/18 1147 08/08/18 0501 08/09/18 0548  Weight: 230 lb (104.3 kg) 229 lb 0.9 oz (103.9 kg) 229 lb 0.9 oz (103.9 kg)    Intake/Output from previous day: 05/27 0701 - 05/28 0700 In: 3484.7 [P.O.:780; I.V.:1940; IV Piggyback:764.7] Out: -   GENERAL:alert, no distress and comfortable SKIN: maculopapular rash to body EYES: normal, Conjunctiva are pink and non-injected, sclera clear OROPHARYNX:no exudate, no erythema and lips, buccal mucosa, and tongue normal  NECK: supple, thyroid normal size, non-tender, without nodularity LYMPH:  no palpable lymphadenopathy in the cervical, axillary or inguinal LUNGS: Diminished on right. Pleurx to right chest.  HEART: regular rate & rhythm and no murmurs and no lower extremity edema ABDOMEN:abdomen soft, non-tender and normal bowel sounds Musculoskeletal:no cyanosis of digits and no clubbing  NEURO: alert & oriented x 3 with fluent speech, no focal motor/sensory deficits  LABORATORY DATA:  I have reviewed the data  as listed CMP Latest Ref Rng & Units 08/09/2018 08/08/2018 08/07/2018  Glucose 70 - 99 mg/dL 111(H) 111(H) 95  BUN 6 - 20 mg/dL 5(L) 9 14  Creatinine 0.61 - 1.24 mg/dL 0.80 0.82 0.92  Sodium 135 - 145 mmol/L 134(L) 133(L) 133(L)   Potassium 3.5 - 5.1 mmol/L 3.3(L) 3.2(L) 2.5(LL)  Chloride 98 - 111 mmol/L 100 98 92(L)  CO2 22 - 32 mmol/L 25 28 26   Calcium 8.9 - 10.3 mg/dL 7.7(L) 7.0(L) 7.8(L)  Total Protein 6.5 - 8.1 g/dL 5.7(L) 5.3(L) 6.6  Total Bilirubin 0.3 - 1.2 mg/dL 0.6 0.9 1.2  Alkaline Phos 38 - 126 U/L 91 75 87  AST 15 - 41 U/L 31 25 25   ALT 0 - 44 U/L 27 23 25     Lab Results  Component Value Date   WBC 5.6 08/09/2018   HGB 10.4 (L) 08/09/2018   HCT 30.0 (L) 08/09/2018   MCV 85.7 08/09/2018   PLT 167 08/09/2018   NEUTROABS 3.5 08/09/2018    Ct Abdomen Pelvis W Contrast  Result Date: 08/07/2018 CLINICAL DATA:  Weakness, nausea, and diarrhea for the past week. Intermittent hematochezia. History of lung cancer. EXAM: CT ABDOMEN AND PELVIS WITH CONTRAST TECHNIQUE: Multidetector CT imaging of the abdomen and pelvis was performed using the standard protocol following bolus administration of intravenous contrast. CONTRAST:  112mL OMNIPAQUE IOHEXOL 300 MG/ML  SOLN COMPARISON:  PET-CT dated July 10, 2018. FINDINGS: Lower chest: Right-sided PleurX catheter again noted with trace loculated effusion. Right-sided pleural and mediastinal nodularity is grossly unchanged. Unchanged 9 x 8 mm nodule in the right lower lobe. Hepatobiliary: Unchanged small simple cyst in the hepatic dome. No other focal liver abnormality. The gallbladder is unremarkable. No biliary dilatation. Pancreas: Unremarkable. No pancreatic ductal dilatation or surrounding inflammatory changes. Spleen: Normal in size without focal abnormality. Adrenals/Urinary Tract: The adrenal glands are unremarkable. No focal renal lesion. Unchanged punctate bilateral nonobstructive renal calculi. No hydronephrosis. The bladder is unremarkable. Stomach/Bowel: The stomach is within normal limits. There is mild circumferential wall thickening of the terminal ileum the remaining small bowel and colon are unremarkable. Normal appendix. Vascular/Lymphatic: Aortic  atherosclerosis. No enlarged abdominal or pelvic lymph nodes. Reproductive: Prostate is unremarkable. Other: No abdominal wall hernia or abnormality. No abdominopelvic ascites. No pneumoperitoneum. Musculoskeletal: No acute or significant osseous findings. IMPRESSION: 1. Terminal ileitis. 2. 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 3. Unchanged punctate nonobstructive bilateral nephrolithiasis. 4.  Aortic atherosclerosis (ICD10-I70.0). Electronically Signed   By: Titus Dubin M.D.   On: 08/07/2018 14:53   Nm Pet Image Initial (pi) Skull Base To Thigh  Result Date: 07/10/2018 CLINICAL DATA:  Subsequent treatment strategy for lung carcinoma non-small cell lung cancer. EXAM: NUCLEAR MEDICINE PET SKULL BASE TO THIGH TECHNIQUE: 10.6 mCi F-18 FDG was injected intravenously. Full-ring PET imaging was performed from the skull base to thigh after the radiotracer. CT data was obtained and used for attenuation correction and anatomic localization. Fasting blood glucose: 08/12/2000 mg/dl COMPARISON:  CT 2182 FINDINGS: Mediastinal blood pool activity: SUV max 2.7 NECK: Focal activity in the LEFT posterior oropharynx in the region of the LEFT palatine tonsil. Lesion is intensely hypermetabolic with SUV max equal 12.6. Activity localizes to an approximately 2 cm lesion. There is a hypermetabolic lymph node in the adjacent LEFT level 2 nodal station anterior to the LEFT sternocleidomastoid muscle with SUV max equal 9.1. This node measures 12 mm (image 33/4) Incidental CT findings: none CHEST: There multiple hypermetabolic nodules scattered throughout the  RIGHT pleural space and extending into the anterior RIGHT mediastinum. Additionally discrete hypermetabolic nodules within the the pulmonary parenchyma which for the most part are associated with the fissures. For example nodule along the oblique fissure with SUV max equal 11.1. Nodules in the lateral pleural space RIGHT upper lobe SUV max equal  13.2. Nodules adjacent to the RIGHT atrium within the anterior aspect the RIGHT mediastinum with SUV max equal 12.3. Additionally there is intensely hypermetabolic RIGHT lower paratracheal lymph node SUV max equal 13.5. No hypermetabolic nodules or pleural thickening in the LEFT lung. Incidental CT findings: none ABDOMEN/PELVIS: Several small hypermetabolic retroperitoneal nodes in the upper abdomen adjacent to the aorta and IVC which are intense for size. For example node posterior the IVC with SUV max equal 7.3 measures approximately 4 mm. No abnormal activity in the liver. Adrenal glands are normal. No abnormal activity in the bowel. No pelvic adenopathy Incidental CT findings: none SKELETON: No focal hypermetabolic activity to suggest skeletal metastasis. Incidental CT findings: none IMPRESSION: 1. Multiple intensely hypermetabolic nodules in the right pleural space and extending along the fissures into the anterior mediastinum consistent with extensive pleural spread of bronchogenic carcinoma. 2. Hypermetabolic RIGHT lower paratracheal metastatic lymph node. 3. Extension of adenopathy into the upper retroperitoneum adjacent the aorta and IVC. 4. Hypermetabolic focus in the left posterior oropharynx (LEFT tonsil) with associated hypermetabolic LEFT level II lymph node. Differential would include primary head neck cancer versus less likely tonsillitis or metastatic lung cancer. Recommend neck CT or direct visualization. Electronically Signed   By: Suzy Bouchard M.D.   On: 07/10/2018 16:26   Dg Chest Portable 1 View  Result Date: 08/07/2018 CLINICAL DATA:  Lung cancer, weakness EXAM: PORTABLE CHEST 1 VIEW COMPARISON:  06/12/2018 FINDINGS: Right Port-A-Cath remains in place, unchanged. Heart is normal size. Right PleurX catheter remains in place. Right pleural effusion and/or pleural thickening has increased since prior study. No pneumothorax. Right lower lobe atelectasis or infiltrate. Left lung clear.  IMPRESSION: Increasing pleural density on the right which could reflect pleural effusion and/or pleural thickening. No pneumothorax. Right lower lobe atelectasis or infiltrate. Electronically Signed   By: Rolm Baptise M.D.   On: 08/07/2018 12:42    ASSESSMENT AND PLAN: This is a very pleasant 58 year old white male with metastatic non-small cell lung cancer, adenocarcinoma currently undergoing systemic chemotherapy with carboplatin, Alimta and Keytruda status post 2 cycles. [name] was held with cycle 2 secondary to rash.  He is now admitted with pancytopenia, entercolitis/typhlitis, hypokalemia, and hypomagnesemia.   CBC reviewed.  White blood cell count and ANC are now normal.  Discontinue Granix.  His platelet count has now normalized and hemoglobin has improved to 10.4.  No transfusion is indicated.  Cultures negative to date.  Currently on oral antibiotics.  Continue Benadryl and topical creams for rash. Patient may benefit from the Prednisone that was previously prescribed. I do not think that he ever took this.  The patient seems to be feeling much better.  If he continues to be afebrile and no worsening of his diarrhea, okay to discharge patient home from medical oncology standpoint.   LOS: 2 days   Mikey Bussing, DNP, AGPCNP-BC, AOCNP 08/09/18

## 2018-08-09 NOTE — Progress Notes (Signed)
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  CT abdomen was done in the emergency department and he was diagnosed with terminal ileitis.  He was also found to have significant pancytopenia and was also diagnosed with neutropenic fever.  He was admitted under hospitalist service and oncology as well as GI was curb sided by admitting physician and based on the recommendations and he was started on IV cefepime and vancomycin with a working diagnosis of neutropenic enterocolitis.  Consultants:   GI  Oncology  Procedures:   None  Antimicrobials:   IV cefepime and vancomycin started on 08/07/2018, will stop both of them  Started on oral ciprofloxacin and Flagyl   Subjective: Patient seen and examined.  He is alert and oriented.  He states that he feels better but he still has intermittent nausea and some abdominal pain but no vomiting since past 48 hours.  He has remained afebrile for more than 24 hours.  ANC has improved.  Objective: Vitals:   08/08/18 1344 08/08/18 1400 08/08/18 2127 08/09/18 0548  BP: (!) 162/89 138/80 140/63 129/69  Pulse: 99  (!) 101 91  Resp: 16  16 14   Temp: 98.4 F (36.9 C)  98.3 F (36.8 C) 98.4 F (36.9 C)  TempSrc: Oral  Oral Oral  SpO2: 92%  93% 96%  Weight:    103.9 kg  Height:        Intake/Output Summary (Last 24 hours) at 08/09/2018 1204 Last data filed at 08/09/2018 0537 Gross per 24 hour  Intake 2501.98 ml  Output -  Net 2501.98 ml   Filed Weights   08/07/18  1147 08/08/18 0501 08/09/18 0548  Weight: 104.3 kg 103.9 kg 103.9 kg    Examination:  General exam: Appears calm and comfortable  Respiratory system: Clear to auscultation. Respiratory effort normal. Cardiovascular system: S1 & S2 heard, RRR. No JVD, murmurs, rubs, gallops or clicks. No pedal edema. Gastrointestinal system: Abdomen is nondistended but obese, soft and tender at right lower quadrant. No organomegaly or masses felt. Normal bowel sounds heard. Central nervous system: Alert and oriented. No focal neurological deficits. Extremities: Symmetric 5 x 5 power. Skin: No rashes, lesions or ulcers Psychiatry: Judgement and insight appear normal. Mood & affect appropriate.  Data Reviewed: I have personally reviewed following labs and imaging studies  CBC: Recent Labs  Lab 08/07/18 1148 08/08/18 0601 08/09/18 0818  WBC 1.0* 1.5* 5.6  NEUTROABS 0.1* 0.6* 3.5  HGB 10.3* 9.1* 10.4*  HCT 29.6* 27.3* 30.0*  MCV 84.3 86.1 85.7  PLT 65* 77* 656   Basic Metabolic Panel: Recent Labs  Lab 08/07/18 1148 08/08/18 0601 08/09/18 0818  NA 133* 133* 134*  K 2.5* 3.2* 3.3*  CL 92* 98 100  CO2 26 28 25   GLUCOSE 95 111* 111*  BUN 14 9 5*  CREATININE 0.92 0.82 0.80  CALCIUM 7.8* 7.0* 7.7*  MG  --  1.4*  --   PHOS  --  1.2*  --    GFR: Estimated Creatinine Clearance: 122.4 mL/min (by C-G formula based  on SCr of 0.8 mg/dL). Liver Function Tests: Recent Labs  Lab 08/07/18 1148 08/08/18 0601 08/09/18 0818  AST 25 25 31   ALT 25 23 27   ALKPHOS 87 75 91  BILITOT 1.2 0.9 0.6  PROT 6.6 5.3* 5.7*  ALBUMIN 3.0* 2.4* 2.5*   Recent Labs  Lab 08/07/18 1148  LIPASE 29   No results for input(s): AMMONIA in the last 168 hours. Coagulation Profile: No results for input(s): INR, PROTIME in the last 168 hours. Cardiac Enzymes: No results for input(s): CKTOTAL, CKMB, CKMBINDEX, TROPONINI in the last 168 hours. BNP (last 3 results) No results for input(s): PROBNP in the last 8760  hours. HbA1C: Recent Labs    08/08/18 0601  HGBA1C 6.0*   CBG: Recent Labs  Lab 08/09/18 0741  GLUCAP 91   Lipid Profile: No results for input(s): CHOL, HDL, LDLCALC, TRIG, CHOLHDL, LDLDIRECT in the last 72 hours. Thyroid Function Tests: Recent Labs    08/07/18 1837  TSH 1.331   Anemia Panel: No results for input(s): VITAMINB12, FOLATE, FERRITIN, TIBC, IRON, RETICCTPCT in the last 72 hours. Sepsis Labs: No results for input(s): PROCALCITON, LATICACIDVEN in the last 168 hours.  Recent Results (from the past 240 hour(s))  SARS Coronavirus 2 (CEPHEID- Performed in [facility] hospital lab), Hosp Order     Status: None   Collection Time: 08/07/18 11:56 AM  Result Value Ref Range Status   SARS Coronavirus 2 NEGATIVE NEGATIVE Final    Comment: (NOTE) If result is NEGATIVE SARS-CoV-2 target nucleic acids are NOT DETECTED. The SARS-CoV-2 RNA is generally detectable in upper and lower  respiratory specimens during the acute phase of infection. The lowest  concentration of SARS-CoV-2 viral copies this assay can detect is 250  copies / mL. A negative result does not preclude SARS-CoV-2 infection  and should not be used as the sole basis for treatment or other  patient management decisions.  A negative result may occur with  improper specimen collection / handling, submission of specimen other  than nasopharyngeal swab, presence of viral mutation(s) within the  areas targeted by this assay, and inadequate number of viral copies  (<250 copies / mL). A negative result must be combined with clinical  observations, patient history, and epidemiological information. If result is POSITIVE SARS-CoV-2 target nucleic acids are DETECTED. The SARS-CoV-2 RNA is generally detectable in upper and lower  respiratory specimens dur ing the acute phase of infection.  Positive  results are indicative of active infection with SARS-CoV-2.  Clinical  correlation with patient history and other  diagnostic information is  necessary to determine patient infection status.  Positive results do  not rule out bacterial infection or co-infection with other viruses. If result is PRESUMPTIVE POSTIVE SARS-CoV-2 nucleic acids MAY BE PRESENT.   A presumptive positive result was obtained on the submitted specimen  and confirmed on repeat testing.  While 2019 novel coronavirus  (SARS-CoV-2) nucleic acids may be present in the submitted sample  additional confirmatory testing may be necessary for epidemiological  and / or clinical management purposes  to differentiate between  SARS-CoV-2 and other Sarbecovirus currently known to infect humans.  If clinically indicated additional testing with an alternate test  methodology [phone]) is advised. The SARS-CoV-2 RNA is generally  detectable in upper and lower respiratory sp ecimens during the acute  phase of infection. The expected result is Negative. Fact Sheet for Patients:  StrictlyIdeas.no Fact Sheet for Healthcare Providers: BankingDealers.co.za This test is not yet approved or cleared by  the [employer] and has been authorized for detection and/or diagnosis of SARS-CoV-2 by FDA under an Emergency Use Authorization (EUA).  This EUA will remain in effect (meaning this test can be used) for the duration of the COVID-19 declaration under Section 564(b)(1) of the Act, 21 U.S.C. section 360bbb-3(b)(1), unless the authorization is terminated or revoked sooner. Performed at [facility], [place] [address] [place], [place] [address]   Blood culture (routine x 2)     Status: None (Preliminary result)   Collection Time: 08/07/18 12:11 PM  Result Value Ref Range Status   Specimen Description   Final    BLOOD RIGHT ANTECUBITAL Performed at [facility] [address], [place], [place] [address]    Special Requests   Final    BOTTLES DRAWN AEROBIC AND  ANAEROBIC Blood Culture results may not be optimal due to an excessive volume of blood received in culture bottles Performed at [facility] [address], [place], [place] [address]    Culture   Final    NO GROWTH < 24 HOURS Performed at [facility] [address] [place], [place] [address]    Report Status PENDING  Incomplete  Blood culture (routine x 2)     Status: None (Preliminary result)   Collection Time: 08/07/18 12:40 PM  Result Value Ref Range Status   Specimen Description   Final    BLOOD SITE NOT SPECIFIED Performed at [facility] [address], [place], [place] [address]    Special Requests   Final    BOTTLES DRAWN AEROBIC AND ANAEROBIC Blood Culture adequate volume Performed at [facility] [address] [place], [place] [address]    Culture   Final    NO GROWTH < 24 HOURS Performed at [facility] [address], [place], [place] [address]    Report Status PENDING  Incomplete  Urine culture     Status: None   Collection Time: 08/08/18  5:09 AM  Result Value Ref Range Status   Specimen Description   Final    URINE, RANDOM Performed at [facility] [address] [place], [place] [address]    Special Requests   Final    NONE Performed at [facility], [place] [address], [place], [place] [address]    Culture   Final    NO GROWTH Performed at [facility], [place] [address], [place], [place] [address]    Report Status 08/09/2018 FINAL  Final      Radiology Studies: Ct Abdomen Pelvis W Contrast  Result Date: 08/07/2018 CLINICAL DATA:  Weakness, nausea, and diarrhea for the past week. Intermittent hematochezia. History of lung cancer. EXAM: CT ABDOMEN AND PELVIS WITH CONTRAST TECHNIQUE: Multidetector CT imaging of the abdomen and pelvis was performed using the standard protocol following bolus administration of intravenous contrast. CONTRAST:   123mL OMNIPAQUE IOHEXOL 300 MG/ML  SOLN COMPARISON:  PET-CT dated July 10, 2018. FINDINGS: Lower chest: Right-sided PleurX catheter again noted with trace loculated effusion. Right-sided pleural and mediastinal nodularity is grossly unchanged. Unchanged 9 x 8 mm nodule in the right lower lobe. Hepatobiliary: Unchanged small simple cyst in the hepatic dome. No other focal liver abnormality. The gallbladder is unremarkable. No biliary dilatation. Pancreas: Unremarkable. No pancreatic ductal dilatation or surrounding inflammatory changes. Spleen: Normal in size without focal abnormality. Adrenals/Urinary Tract: The adrenal glands are unremarkable. No focal renal lesion. Unchanged  punctate bilateral nonobstructive renal calculi. No hydronephrosis. The bladder is unremarkable. Stomach/Bowel: The stomach is within normal limits. There is mild circumferential wall thickening of the terminal ileum the remaining small bowel and colon are unremarkable. Normal appendix. Vascular/Lymphatic: Aortic atherosclerosis. No enlarged abdominal or pelvic lymph nodes. Reproductive: Prostate is unremarkable. Other: No abdominal wall hernia or abnormality. No abdominopelvic ascites. No pneumoperitoneum. Musculoskeletal: No acute or significant osseous findings. IMPRESSION: 1. Terminal ileitis. 2. 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 3. Unchanged punctate nonobstructive bilateral nephrolithiasis. 4.  Aortic atherosclerosis (ICD10-I70.0). Electronically Signed   By: Titus Dubin M.D.   On: 08/07/2018 14:53   Dg Chest Portable 1 View  Result Date: 08/07/2018 CLINICAL DATA:  Lung cancer, weakness EXAM: PORTABLE CHEST 1 VIEW COMPARISON:  06/12/2018 FINDINGS: Right Port-A-Cath remains in place, unchanged. Heart is normal size. Right PleurX catheter remains in place. Right pleural effusion and/or pleural thickening has increased since prior study. No pneumothorax. Right lower lobe atelectasis or infiltrate.  Left lung clear. IMPRESSION: Increasing pleural density on the right which could reflect pleural effusion and/or pleural thickening. No pneumothorax. Right lower lobe atelectasis or infiltrate. Electronically Signed   By: Rolm Baptise M.D.   On: 08/07/2018 12:42    Scheduled Meds: . aspirin EC  81 mg Oral Daily  . atorvastatin  40 mg Oral QHS  . cholecalciferol  1,000 Units Oral Daily  . donepezil  5 mg Oral QHS  . escitalopram  20 mg Oral Daily  . folic acid  1 mg Oral Daily  . mineral oil-hydrophilic petrolatum   Topical BID  . pantoprazole  40 mg Oral Daily  . potassium chloride  40 mEq Oral BID  . vitamin B-12  500 mcg Oral Daily   Continuous Infusions: . 0.9 % NaCl with KCl 40 mEq / L 100 mL/hr (08/09/18 0853)  . ceFEPime (MAXIPIME) IV 2 g (08/09/18 0535)  . vancomycin 1,000 mg (08/09/18 0155)     LOS: 2 days   Assessment & Plan:   Active Problems:   Hypokalemia   High cholesterol   Hypertension   Malignant pleural effusion   Pacemaker   Recurrent pleural effusion on right   Drug-induced skin rash   Neutropenic fever (HCC)   Neutropenic typhlitis   Antineoplastic chemotherapy induced pancytopenia (HCC)   Hyponatremia   Chemotherapy-induced neutropenia (HCC)  Neutropenic fever with pancytopenia: ANC was 100 upon presentation.  Currently 3.6.  He continues to be on Granix 40 mcg daily along with IV cefepime and vancomycin.  He has remained afebrile for more than 24 hours.  Seen by oncology.  We will switch antibiotics from IV cefepime and vancomycin to oral ciprofloxacin and Flagyl.  Neutropenic enterocolitis/typhlitis: CT abdomen and pelvis shows terminal ileitis.  Case was discussed between on-call GI and admitting hospitalist service and working diagnosis is neutropenic enterocolitis. patient was started on cefepime and vancomycin for that.  I discussed with Dr. Cristina Gong from GI who had mentioned that he was curb sided for the patient.  Switching him to oral  ciprofloxacin and Flagyl.  Will advance diet as tolerated.  Hypokalemia: Again 3.3.  Will replace orally and recheck in the morning.  Hypomagnesemia: Resolved.  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  Oncology on board.  Defer further management to them.  Hyperlipidemia: Continue atorvastatin.  Obstructive sleep apnea: None on CPAP  GERD: Continue omeprazole  SSS status post pacemaker: Continue aspirin  and monitor on telemetry.  Hypertension: Lisinopril on hold.  Blood pressure controlled.  COPD: None in exacerbation.  Continue albuterol as needed.  Diffuse maculopapular drug-induced rash: Continue topical cream.  Wound care on board.   DVT prophylaxis: SCD Code Status: Full code Family Communication: Discussed with patient Disposition Plan: Pending clinical improvement.  Potentially discharge back home tomorrow.   Time spent: 30 minutes   Darliss Cheney, MD [facility] Pager [phone]  If 7PM-7AM, please contact night-coverage [email or web address] Password [facility] 08/09/2018, 12:04 PM

## 2018-08-10 DIAGNOSIS — C799 Secondary malignant neoplasm of unspecified site: Secondary | ICD-10-CM

## 2018-08-10 DIAGNOSIS — D61810 Antineoplastic chemotherapy induced pancytopenia: Secondary | ICD-10-CM

## 2018-08-10 DIAGNOSIS — K5289 Other specified noninfective gastroenteritis and colitis: Secondary | ICD-10-CM

## 2018-08-10 DIAGNOSIS — E876 Hypokalemia: Secondary | ICD-10-CM

## 2018-08-10 LAB — CBC WITH DIFFERENTIAL/PLATELET
Abs Immature Granulocytes: 1.54 10*3/uL — ABNORMAL HIGH (ref 0.00–0.07)
Basophils Absolute: 0 10*3/uL (ref 0.0–0.1)
Basophils Relative: 0 %
Eosinophils Absolute: 0.1 10*3/uL (ref 0.0–0.5)
Eosinophils Relative: 1 %
HCT: 27.3 % — ABNORMAL LOW (ref 39.0–52.0)
Hemoglobin: 9.4 g/dL — ABNORMAL LOW (ref 13.0–17.0)
Immature Granulocytes: 20 %
Lymphocytes Relative: 12 %
Lymphs Abs: 1 10*3/uL (ref 0.7–4.0)
MCH: 29.8 pg (ref 26.0–34.0)
MCHC: 34.4 g/dL (ref 30.0–36.0)
MCV: 86.7 fL (ref 80.0–100.0)
Monocytes Absolute: 0.7 10*3/uL (ref 0.1–1.0)
Monocytes Relative: 9 %
Neutro Abs: 4.6 10*3/uL (ref 1.7–7.7)
Neutrophils Relative %: 58 %
Platelets: 194 10*3/uL (ref 150–400)
RBC: 3.15 MIL/uL — ABNORMAL LOW (ref 4.22–5.81)
RDW: 13.4 % (ref 11.5–15.5)
WBC: 7.9 10*3/uL (ref 4.0–10.5)
nRBC: 0 % (ref 0.0–0.2)

## 2018-08-10 LAB — BASIC METABOLIC PANEL
Anion gap: 7 (ref 5–15)
BUN: <5 mg/dL — ABNORMAL LOW (ref 6–20)
CO2: 26 mmol/L (ref 22–32)
Calcium: 7.5 mg/dL — ABNORMAL LOW (ref 8.9–10.3)
Chloride: 101 mmol/L (ref 98–111)
Creatinine, Ser: 0.82 mg/dL (ref 0.61–1.24)
GFR calc Af Amer: >60 mL/min (ref >60)
GFR calc non Af Amer: >60 mL/min (ref >60)
Glucose, Bld: 107 mg/dL — ABNORMAL HIGH (ref 70–99)
Potassium: 3.1 mmol/L — ABNORMAL LOW (ref 3.5–5.1)
Sodium: 134 mmol/L — ABNORMAL LOW (ref 135–145)

## 2018-08-10 LAB — GLUCOSE, CAPILLARY: Glucose-Capillary: 93 mg/dL (ref 70–99)

## 2018-08-10 MED ORDER — CIPROFLOXACIN HCL 500 MG PO TABS: 500.0000 mg | ORAL_TABLET | Freq: Two times a day (BID) | ORAL | 0 refills | Status: AC

## 2018-08-10 MED ORDER — POTASSIUM CHLORIDE CRYS ER 20 MEQ PO TBCR
40.0000 meq | EXTENDED_RELEASE_TABLET | Freq: Once | ORAL | Status: AC
  Administered 2018-08-10: 40 meq via ORAL
  Filled 2018-08-10: qty 2

## 2018-08-10 MED ORDER — POTASSIUM CHLORIDE ER 10 MEQ PO TBCR: 10.0000 meq | EXTENDED_RELEASE_TABLET | Freq: Three times a day (TID) | ORAL | 0 refills | Status: DC

## 2018-08-10 MED ORDER — METRONIDAZOLE 500 MG PO TABS: 500.0000 mg | ORAL_TABLET | Freq: Three times a day (TID) | ORAL | 0 refills | Status: AC

## 2018-08-10 MED FILL — POTASSIUM CHL ER M10 TABLET: 10 | 4 days supply | Qty: 12 | Fill #0

## 2018-08-10 MED FILL — CIPROFLOXACIN HCL 500 MG TA: 500 | 13 days supply | Qty: 26 | Fill #0

## 2018-08-10 MED FILL — METRONIDAZOLE 500 MG TABS: 500 | 13 days supply | Qty: 39 | Fill #0

## 2018-08-10 NOTE — TOC Progression Note (Signed)
Transition of Care [facility]) - Progression Note    Patient Details  Name: Daniel Bryant MRN: [id number] Date of Birth: 1960-09-11  Transition of [facility]) CM/SW Contact  Leeroy Cha, RN Phone Number: 08/10/2018, 11:26 AM  Clinical Narrative:    tcf-Maggie-pt needs to go into a assisted living if possible/had psych eval. -has [facility] but does not have good recall for short term items/needs assistance in remembering meds and md appts, does not live by himslef well.  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 Has no family support.[facility] But does need this area placement if possible.  Expected Discharge Plan: [place] Barriers to Discharge: Continued Medical Work up  Expected Discharge Plan and Services Expected Discharge Plan: [place]   Discharge Planning Services: CM Consult Post Acute Care Choice: [place] arrangements for the past 2 months: Apartment Expected Discharge Date: (unknown)                                     Social Determinants of Health (SDOH) Interventions    Readmission Risk Interventions No flowsheet data found.

## 2018-08-10 NOTE — Progress Notes (Signed)
HEMATOLOGY-ONCOLOGY PROGRESS NOTE  SUBJECTIVE: The patient is unchanged compared to yesterday morning.  Continues to nausea vomiting.  He has had 2 episodes of diarrhea in the past 24 hours.  Rash is unchanged.  He has no other complaints this morning.    Adenocarcinoma of right lung ([place])   05/02/2018 Initial Diagnosis    Adenocarcinoma of right lung ([place])    07/04/2018 -  Chemotherapy    The patient had palonosetron (ALOXI) injection 0.25 mg, 0.25 mg, Intravenous,  Once, 2 of 4 cycles Administration: 0.25 mg (07/04/2018), 0.25 mg (07/24/2018) PEMEtrexed (ALIMTA) 1,200 mg in sodium chloride 0.9 % 100 mL chemo infusion, 510 mg/m2 = 1,175 mg, Intravenous,  Once, 2 of 9 cycles Administration: 1,200 mg (07/04/2018), 1,200 mg (07/24/2018) CARBOplatin (PARAPLATIN) 750 mg in sodium chloride 0.9 % 250 mL chemo infusion, 750 mg (100 % of original dose 750 mg), Intravenous,  Once, 2 of 4 cycles Dose modification: 750 mg (original dose 750 mg, Cycle 1), 665.5 mg (original dose 665.5 mg, Cycle 2) Administration: 750 mg (07/04/2018), 670 mg (07/24/2018) pembrolizumab (KEYTRUDA) 200 mg in sodium chloride 0.9 % 50 mL chemo infusion, 200 mg, Intravenous, Once, 1 of 8 cycles Administration: 200 mg (07/04/2018) fosaprepitant (EMEND) 150 mg, dexamethasone (DECADRON) 12 mg in sodium chloride 0.9 % 145 mL IVPB, , Intravenous,  Once, 2 of 4 cycles Administration:  (07/04/2018),  (07/24/2018)  for chemotherapy treatment.       REVIEW OF SYSTEMS:   Constitutional: Denies fevers, chills Eyes: Denies blurriness of vision Ears, nose, mouth, throat, and face: Denies mucositis or sore throat Respiratory: Denies cough, dyspnea or wheezes Cardiovascular: Denies palpitation, chest discomfort Gastrointestinal: Nausea and vomiting have resolved.  Reports about 2 episodes of diarrhea in the past 24 hours. Skin: Reports rash to entire body with itching.  Lymphatics: Denies new lymphadenopathy or easy  bruising Neurological:Denies numbness, tingling or new weaknesses Behavioral/Psych: Mood is stable, no new changes  Extremities: No lower extremity edema All other systems were reviewed with the patient and are negative.  I have reviewed the past medical history, past surgical history, social history and family history with the patient and they are unchanged from previous note.   PHYSICAL EXAMINATION: ECOG PERFORMANCE STATUS: 1 - Symptomatic but completely ambulatory  Vitals:   08/09/18 2055 08/10/18 0513  BP: (!) 149/99 127/71  Pulse: (!) 104 80  Resp: 18 18  Temp: 98.8 F (37.1 C) 98.5 F (36.9 C)  SpO2: 96% 95%   Filed Weights   08/08/18 0501 08/09/18 0548 08/10/18 0513  Weight: 229 lb 0.9 oz (103.9 kg) 229 lb 0.9 oz (103.9 kg) 229 lb 4.5 oz (104 kg)    Intake/Output from previous day: No intake/output data recorded.  GENERAL:alert, no distress and comfortable SKIN: maculopapular rash to body EYES: normal, Conjunctiva are pink and non-injected, sclera clear OROPHARYNX:no exudate, no erythema and lips, buccal mucosa, and tongue normal  NECK: supple, thyroid normal size, non-tender, without nodularity LYMPH:  no palpable lymphadenopathy in the cervical, axillary or inguinal LUNGS: Diminished on right. Pleurx to right chest.  HEART: regular rate & rhythm and no murmurs and no lower extremity edema ABDOMEN:abdomen soft, non-tender and normal bowel sounds Musculoskeletal:no cyanosis of digits and no clubbing  NEURO: alert & oriented x 3 with fluent speech, no focal motor/sensory deficits  LABORATORY DATA:  I have reviewed the data as listed CMP Latest Ref Rng & Units 08/09/2018 08/08/2018 08/07/2018  Glucose 70 - 99 mg/dL 111(H) 111(H) 95  BUN 6 -  20 mg/dL 5(L) 9 14  Creatinine 0.61 - 1.24 mg/dL 0.80 0.82 0.92  Sodium 135 - 145 mmol/L 134(L) 133(L) 133(L)  Potassium 3.5 - 5.1 mmol/L 3.3(L) 3.2(L) 2.5(LL)  Chloride 98 - 111 mmol/L 100 98 92(L)  CO2 22 - 32 mmol/L 25 28 26    Calcium 8.9 - 10.3 mg/dL 7.7(L) 7.0(L) 7.8(L)  Total Protein 6.5 - 8.1 g/dL 5.7(L) 5.3(L) 6.6  Total Bilirubin 0.3 - 1.2 mg/dL 0.6 0.9 1.2  Alkaline Phos 38 - 126 U/L 91 75 87  AST 15 - 41 U/L 31 25 25   ALT 0 - 44 U/L 27 23 25     Lab Results  Component Value Date   WBC 5.6 08/09/2018   HGB 10.4 (L) 08/09/2018   HCT 30.0 (L) 08/09/2018   MCV 85.7 08/09/2018   PLT 167 08/09/2018   NEUTROABS 3.5 08/09/2018    Ct Abdomen Pelvis W Contrast  Result Date: 08/07/2018 CLINICAL DATA:  Weakness, nausea, and diarrhea for the past week. Intermittent hematochezia. History of lung cancer. EXAM: CT ABDOMEN AND PELVIS WITH CONTRAST TECHNIQUE: Multidetector CT imaging of the abdomen and pelvis was performed using the standard protocol following bolus administration of intravenous contrast. CONTRAST:  156mL OMNIPAQUE IOHEXOL 300 MG/ML  SOLN COMPARISON:  PET-CT dated July 10, 2018. FINDINGS: Lower chest: Right-sided PleurX catheter again noted with trace loculated effusion. Right-sided pleural and mediastinal nodularity is grossly unchanged. Unchanged 9 x 8 mm nodule in the right lower lobe. Hepatobiliary: Unchanged small simple cyst in the hepatic dome. No other focal liver abnormality. The gallbladder is unremarkable. No biliary dilatation. Pancreas: Unremarkable. No pancreatic ductal dilatation or surrounding inflammatory changes. Spleen: Normal in size without focal abnormality. Adrenals/Urinary Tract: The adrenal glands are unremarkable. No focal renal lesion. Unchanged punctate bilateral nonobstructive renal calculi. No hydronephrosis. The bladder is unremarkable. Stomach/Bowel: The stomach is within normal limits. There is mild circumferential wall thickening of the terminal ileum the remaining small bowel and colon are unremarkable. Normal appendix. Vascular/Lymphatic: Aortic atherosclerosis. No enlarged abdominal or pelvic lymph nodes. Reproductive: Prostate is unremarkable. Other: No abdominal wall  hernia or abnormality. No abdominopelvic ascites. No pneumoperitoneum. Musculoskeletal: No acute or significant osseous findings. IMPRESSION: 1. Terminal ileitis. 2. 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 3. Unchanged punctate nonobstructive bilateral nephrolithiasis. 4.  Aortic atherosclerosis (ICD10-I70.0). Electronically Signed   By: Titus Dubin M.D.   On: 08/07/2018 14:53   Dg Chest Portable 1 View  Result Date: 08/07/2018 CLINICAL DATA:  Lung cancer, weakness EXAM: PORTABLE CHEST 1 VIEW COMPARISON:  06/12/2018 FINDINGS: Right Port-A-Cath remains in place, unchanged. Heart is normal size. Right PleurX catheter remains in place. Right pleural effusion and/or pleural thickening has increased since prior study. No pneumothorax. Right lower lobe atelectasis or infiltrate. Left lung clear. IMPRESSION: Increasing pleural density on the right which could reflect pleural effusion and/or pleural thickening. No pneumothorax. Right lower lobe atelectasis or infiltrate. Electronically Signed   By: Rolm Baptise M.D.   On: 08/07/2018 12:42    ASSESSMENT AND PLAN: This is a very pleasant 58 year old white male with metastatic non-small cell lung cancer, adenocarcinoma currently undergoing systemic chemotherapy with carboplatin, Alimta and Keytruda status post 2 cycles. [name] was held with cycle 2 secondary to rash.  He is now admitted with pancytopenia, entercolitis/typhlitis, hypokalemia, and hypomagnesemia.   CBC yesterday was improved.  Clinically, he continues to improve.  Discussed with hospitalist and okay from medical oncology standpoint to charge to home later today if medically stable.  Continue Benadryl and topical creams for rash.   He will keep his follow-up appointment at the cancer center on 08/14/2018.   LOS: 3 days   Mikey Bussing, DNP, AGPCNP-BC, AOCNP 08/10/18

## 2018-08-10 NOTE — Progress Notes (Signed)
Pt discharged home with friend Pamala Hurry in stable condition. Discharge instructions given to patient and friend. Scripts sent to pharmacy of choice. No immediate questions or concerns at this time. Pt discharged from unit via wheelchair.

## 2018-08-10 NOTE — Discharge Summary (Addendum)
Physician Discharge Summary  Daniel Bryant XHB:[id number] DOB: 1960-07-22 DOA: 08/07/2018  PCP: [facility]  Admit date: 08/07/2018 Discharge date: 08/10/2018  Admitted From: Home Disposition: Home  Recommendations for Outpatient Follow-up:  1. Follow up with PCP in 1 weeks 2. Please obtain BMP/CBC in one week 3. Please follow up on the following pending results:  Home Health: None Equipment/Devices: None  Discharge Condition: Stable CODE STATUS: Full code Diet recommendation: Cardiac healthy diet  Subjective: Patient seen and examined.  He has no complaints.  No more abdominal pain or fever.  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  CT abdomen was done in the emergency department and he was diagnosed with terminal ileitis.  He was also found to have significant pancytopenia and was also diagnosed with neutropenic fever.  He was admitted under hospitalist service and oncology as well as GI was curb sided by admitting physician and based on the recommendations and he was started on IV cefepime and vancomycin with a working diagnosis of neutropenic enterocolitis.  Over the course of past few days, his leukopenia has improved and he has remained afebrile for more than 24 hours.  His antibiotics were switched to oral ciprofloxacin and Flagyl yesterday which she tolerated well.  He has mild hypokalemia of 3.1 today for which he is going to receive 40 mEq of potassium replacement today.  He is feeling much better and afebrile and leukopenia improved so he will be discharged back to home and will continue 30 more  days of oral ciprofloxacin and Flagyl.  I recommend his CBC and CMP should be checked next week when he sees his PCP.  Of note, he also came in with hyponatremia which has improved as well.  Discharge Diagnoses:  Active Problems:   Hypokalemia   High cholesterol   Hypertension   Malignant pleural effusion   Pacemaker   Recurrent pleural effusion on right   Drug-induced skin rash   Neutropenic fever (HCC)   Neutropenic typhlitis   Antineoplastic chemotherapy induced pancytopenia (HCC)   Hyponatremia   Chemotherapy-induced neutropenia [facility])    Discharge Instructions  Discharge Instructions    Discharge patient   Complete by:  As directed    Discharge disposition:  01-Home or Self Care   Discharge patient date:  08/10/2018     Allergies as of 08/10/2018      Reactions   Other Other (See Comments)   Patient states "I had a scrotal procedure a couple of years ago, there was something they gave me to relax me and I started freaking out and seeing things".  Unable to ascertain from pt or his records what medication was.      Medication List    TAKE these medications   albuterol (2.5 MG/3ML) 0.083% nebulizer solution Commonly known as:  PROVENTIL Take 3 mLs (2.5 mg total) by nebulization 2 (two) times daily as needed for wheezing or shortness of breath.   aspirin 81 MG EC tablet Take 1 tablet (81 mg total) by mouth daily.   atorvastatin 40 MG tablet Commonly known as:  LIPITOR Take 40 mg by mouth at bedtime.   ciprofloxacin 500 MG tablet  Commonly known as:  CIPRO Take 1 tablet (500 mg total) by mouth 2 (two) times daily for 13 days.   diphenhydrAMINE 25 MG tablet Commonly known as:  BENADRYL Take 1 tablet (25 mg total) by mouth every 6 (six) hours as needed for itching or allergies.   donepezil 5 MG tablet Commonly known as:  ARICEPT Take 5 mg by mouth at bedtime.   escitalopram 20 MG tablet Commonly known as:  LEXAPRO Take 20 mg by mouth daily.   folic acid 1 MG  tablet Commonly known as:  FOLVITE Take 1 tablet (1 mg total) by mouth daily.   lidocaine 2 % solution Commonly known as:  XYLOCAINE Use as directed 5 mLs in the mouth or throat every 3 (three) hours as needed for mouth pain. Gargle and spit   lisinopril 5 MG tablet Commonly known as:  ZESTRIL Take 5 mg by mouth daily.   metroNIDAZOLE 500 MG tablet Commonly known as:  FLAGYL Take 1 tablet (500 mg total) by mouth every 8 (eight) hours for 13 days.   pantoprazole 40 MG tablet Commonly known as:  PROTONIX Take 1 tablet (40 mg total) by mouth daily.   potassium chloride 10 MEQ tablet Commonly known as:  K-DUR Take 1 tablet (10 mEq total) by mouth 3 (three) times daily for 12 doses.   predniSONE 10 MG tablet Commonly known as:  DELTASONE Take 100 mg p.o. daily for 1 week followed by 80 mg p.o. daily for 1 week followed by 60 mg p.o. daily for 1 week followed by 40 mg p.o. daily for 1 week followed by 20 mg p.o. daily for 1 weeks then 10 mg p.o. daily for 1 week.   prochlorperazine 10 MG tablet Commonly known as:  COMPAZINE Take 1 tablet (10 mg total) by mouth every 6 (six) hours as needed for nausea or vomiting.   vitamin B-12 500 MCG tablet Commonly known as:  CYANOCOBALAMIN Take 500 mcg by mouth daily.   Vitamin D3 25 MCG (1000 UT) Caps Take 1,000 Units by mouth daily.      Follow-up Information    [facility] Follow up in 1 week(s).   Contact information: [place] [phone]          Allergies  Allergen Reactions  . Other Other (See Comments)    Patient states "I had a scrotal procedure a couple of years ago, there was something they gave me to relax me and I started freaking out and seeing things".  Unable to ascertain from pt or his records what medication was.    Consultations: GI and oncology   Procedures/Studies: Ct Abdomen Pelvis W Contrast  Result Date: 08/07/2018 CLINICAL DATA:   Weakness, nausea, and diarrhea for the past week. Intermittent hematochezia. History of lung cancer. EXAM: CT ABDOMEN AND PELVIS WITH CONTRAST TECHNIQUE: Multidetector CT imaging of the abdomen and pelvis was performed using the standard protocol following bolus administration of intravenous contrast. CONTRAST:  11mL OMNIPAQUE IOHEXOL 300 MG/ML  SOLN COMPARISON:  PET-CT dated July 10, 2018. FINDINGS: Lower chest: Right-sided PleurX catheter again noted with trace loculated effusion. Right-sided pleural and mediastinal nodularity is grossly unchanged. Unchanged 9 x 8 mm nodule in the right lower lobe. Hepatobiliary: Unchanged small simple cyst in the hepatic dome. No other focal liver abnormality. The gallbladder is unremarkable. No biliary dilatation. Pancreas: Unremarkable. No pancreatic ductal dilatation or surrounding inflammatory changes. Spleen: Normal in size without focal abnormality. Adrenals/Urinary Tract: The  adrenal glands are unremarkable. No focal renal lesion. Unchanged punctate bilateral nonobstructive renal calculi. No hydronephrosis. The bladder is unremarkable. Stomach/Bowel: The stomach is within normal limits. There is mild circumferential wall thickening of the terminal ileum the remaining small bowel and colon are unremarkable. Normal appendix. Vascular/Lymphatic: Aortic atherosclerosis. No enlarged abdominal or pelvic lymph nodes. Reproductive: Prostate is unremarkable. Other: No abdominal wall hernia or abnormality. No abdominopelvic ascites. No pneumoperitoneum. Musculoskeletal: No acute or significant osseous findings. IMPRESSION: 1. Terminal ileitis. 2. 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 3. Unchanged punctate nonobstructive bilateral nephrolithiasis. 4.  Aortic atherosclerosis (ICD10-I70.0). Electronically Signed   By: Titus Dubin M.D.   On: 08/07/2018 14:53   Dg Chest Portable 1 View  Result Date: 08/07/2018 CLINICAL DATA:  Lung cancer, weakness  EXAM: PORTABLE CHEST 1 VIEW COMPARISON:  06/12/2018 FINDINGS: Right Port-A-Cath remains in place, unchanged. Heart is normal size. Right PleurX catheter remains in place. Right pleural effusion and/or pleural thickening has increased since prior study. No pneumothorax. Right lower lobe atelectasis or infiltrate. Left lung clear. IMPRESSION: Increasing pleural density on the right which could reflect pleural effusion and/or pleural thickening. No pneumothorax. Right lower lobe atelectasis or infiltrate. Electronically Signed   By: Rolm Baptise M.D.   On: 08/07/2018 12:42     Discharge Exam: Vitals:   08/09/18 2055 08/10/18 0513  BP: (!) 149/99 127/71  Pulse: (!) 104 80  Resp: 18 18  Temp: 98.8 F (37.1 C) 98.5 F (36.9 C)  SpO2: 96% 95%   Vitals:   08/09/18 0548 08/09/18 1326 08/09/18 2055 08/10/18 0513  BP: 129/69 125/72 (!) 149/99 127/71  Pulse: 91 82 (!) 104 80  Resp: 14 14 18 18   Temp: 98.4 F (36.9 C) 98.6 F (37 C) 98.8 F (37.1 C) 98.5 F (36.9 C)  TempSrc: Oral Oral Oral Oral  SpO2: 96% 97% 96% 95%  Weight: 103.9 kg   104 kg  Height:        General: Pt is alert, awake, not in acute distress Cardiovascular: RRR, S1/S2 +, no rubs, no gallops Respiratory: CTA bilaterally, no wheezing, no rhonchi Abdominal: Soft, NT, ND, bowel sounds + Extremities: no edema, no cyanosis    The results of significant diagnostics from this hospitalization (including imaging, microbiology, ancillary and laboratory) are listed below for reference.     Microbiology: Recent Results (from the past 240 hour(s))  SARS Coronavirus 2 (CEPHEID- Performed in [facility] hospital lab), Hosp Order     Status: None   Collection Time: 08/07/18 11:56 AM  Result Value Ref Range Status   SARS Coronavirus 2 NEGATIVE NEGATIVE Final    Comment: (NOTE) If result is NEGATIVE SARS-CoV-2 target nucleic acids are NOT DETECTED. The SARS-CoV-2 RNA is generally detectable in upper and lower  respiratory  specimens during the acute phase of infection. The lowest  concentration of SARS-CoV-2 viral copies this assay can detect is 250  copies / mL. A negative result does not preclude SARS-CoV-2 infection  and should not be used as the sole basis for treatment or other  patient management decisions.  A negative result may occur with  improper specimen collection / handling, submission of specimen other  than nasopharyngeal swab, presence of viral mutation(s) within the  areas targeted by this assay, and inadequate number of viral copies  (<250 copies / mL). A negative result must be combined with clinical  observations, patient history, and epidemiological information. If result is POSITIVE SARS-CoV-2 target nucleic acids are DETECTED. The  SARS-CoV-2 RNA is generally detectable in upper and lower  respiratory specimens dur ing the acute phase of infection.  Positive  results are indicative of active infection with SARS-CoV-2.  Clinical  correlation with patient history and other diagnostic information is  necessary to determine patient infection status.  Positive results do  not rule out bacterial infection or co-infection with other viruses. If result is PRESUMPTIVE POSTIVE SARS-CoV-2 nucleic acids MAY BE PRESENT.   A presumptive positive result was obtained on the submitted specimen  and confirmed on repeat testing.  While 2019 novel coronavirus  (SARS-CoV-2) nucleic acids may be present in the submitted sample  additional confirmatory testing may be necessary for epidemiological  and / or clinical management purposes  to differentiate between  SARS-CoV-2 and other Sarbecovirus currently known to infect humans.  If clinically indicated additional testing with an alternate test  methodology [phone]) is advised. The SARS-CoV-2 RNA is generally  detectable in upper and lower respiratory sp ecimens during the acute  phase of infection. The expected result is Negative. Fact Sheet for  Patients:  StrictlyIdeas.no Fact Sheet for Healthcare Providers: BankingDealers.co.za This test is not yet approved or cleared by the [place] FDA and has been authorized for detection and/or diagnosis of SARS-CoV-2 by FDA under an Emergency Use Authorization (EUA).  This EUA will remain in effect (meaning this test can be used) for the duration of the COVID-19 declaration under Section 564(b)(1) of the Act, 21 U.S.C. section 360bbb-3(b)(1), unless the authorization is terminated or revoked sooner. Performed at [facility], [place] [address], [place], [place] [address]   Blood culture (routine x 2)     Status: None (Preliminary result)   Collection Time: 08/07/18 12:11 PM  Result Value Ref Range Status   Specimen Description   Final    BLOOD RIGHT ANTECUBITAL Performed at [facility] [address] [place], [place] [address]    Special Requests   Final    BOTTLES DRAWN AEROBIC AND ANAEROBIC Blood Culture results may not be optimal due to an excessive volume of blood received in culture bottles Performed at [facility] [address], [place], [place] [address]    Culture   Final    NO GROWTH 2 DAYS Performed at [facility] [address], [place], [place] [address]    Report Status PENDING  Incomplete  Blood culture (routine x 2)     Status: None (Preliminary result)   Collection Time: 08/07/18 12:40 PM  Result Value Ref Range Status   Specimen Description   Final    BLOOD SITE NOT SPECIFIED Performed at [facility] [address] [place], [place] [address]    Special Requests   Final    BOTTLES DRAWN AEROBIC AND ANAEROBIC Blood Culture adequate volume Performed at [facility] [address] [place], [place] [address]    Culture   Final    NO GROWTH 2 DAYS Performed at [facility] [address],  [place], [place] [address]    Report Status PENDING  Incomplete  Urine culture     Status: None   Collection Time: 08/08/18  5:09 AM  Result Value Ref Range Status   Specimen Description   Final    URINE, RANDOM Performed at [facility] [address] [place], [place] [address]    Special Requests   Final    NONE Performed at [facility],  [place] [address] [place], [place] [address]    Culture   Final    NO GROWTH Performed at [facility], [place] [address], [place], [place] [address]    Report Status 08/09/2018 FINAL  Final     Labs: BNP (last 3 results) Recent Labs    04/06/18 0601 05/01/18 1016  BNP 74.1 35.4   Basic Metabolic Panel: Recent Labs  Lab 08/07/18 1148 08/08/18 0601 08/09/18 0818 08/10/18 1118  NA 133* 133* 134* 134*  K 2.5* 3.2* 3.3* 3.1*  CL 92* 98 100 101  CO2 26 28 25 26   GLUCOSE 95 111* 111* 107*  BUN 14 9 5* <5*  CREATININE 0.92 0.82 0.80 0.82  CALCIUM 7.8* 7.0* 7.7* 7.5*  MG  --  1.4*  --   --   PHOS  --  1.2*  --   --    Liver Function Tests: Recent Labs  Lab 08/07/18 1148 08/08/18 0601 08/09/18 0818  AST 25 25 31   ALT 25 23 27   ALKPHOS 87 75 91  BILITOT 1.2 0.9 0.6  PROT 6.6 5.3* 5.7*  ALBUMIN 3.0* 2.4* 2.5*   Recent Labs  Lab 08/07/18 1148  LIPASE 29   No results for input(s): AMMONIA in the last 168 hours. CBC: Recent Labs  Lab 08/07/18 1148 08/08/18 0601 08/09/18 0818 08/10/18 1118  WBC 1.0* 1.5* 5.6 7.9  NEUTROABS 0.1* 0.6* 3.5 4.6  HGB 10.3* 9.1* 10.4* 9.4*  HCT 29.6* 27.3* 30.0* 27.3*  MCV 84.3 86.1 85.7 86.7  PLT 65* 77* 167 194   Cardiac Enzymes: No results for input(s): CKTOTAL, CKMB, CKMBINDEX, TROPONINI in the last 168 hours. BNP: Invalid input(s): POCBNP CBG: Recent Labs  Lab 08/09/18 0741 08/10/18 0757  GLUCAP 91 93   D-Dimer No results for input(s): DDIMER in the last 72 hours. Hgb A1c Recent Labs    08/08/18 0601  HGBA1C 6.0*   Lipid  Profile No results for input(s): CHOL, HDL, LDLCALC, TRIG, CHOLHDL, LDLDIRECT in the last 72 hours. Thyroid function studies Recent Labs    08/07/18 1837  TSH 1.331   Anemia work up No results for input(s): VITAMINB12, FOLATE, FERRITIN, TIBC, IRON, RETICCTPCT in the last 72 hours. Urinalysis    Component Value Date/Time   COLORURINE YELLOW 08/08/2018 0509   APPEARANCEUR HAZY (A) 08/08/2018 0509   LABSPEC 1.025 08/08/2018 0509   PHURINE 5.0 08/08/2018 0509   GLUCOSEU NEGATIVE 08/08/2018 0509   HGBUR SMALL (A) 08/08/2018 0509   BILIRUBINUR NEGATIVE 08/08/2018 0509   KETONESUR NEGATIVE 08/08/2018 0509   PROTEINUR NEGATIVE 08/08/2018 0509   NITRITE NEGATIVE 08/08/2018 0509   LEUKOCYTESUR NEGATIVE 08/08/2018 0509   Sepsis Labs Invalid input(s): PROCALCITONIN,  WBC,  LACTICIDVEN Microbiology Recent Results (from the past 240 hour(s))  SARS Coronavirus 2 (CEPHEID- Performed in [facility] hospital lab), Hosp Order     Status: None   Collection Time: 08/07/18 11:56 AM  Result Value Ref Range Status   SARS Coronavirus 2 NEGATIVE NEGATIVE Final    Comment: (NOTE) If result is NEGATIVE SARS-CoV-2 target nucleic acids are NOT DETECTED. The SARS-CoV-2 RNA is generally detectable in upper and lower  respiratory specimens during the acute phase of infection. The lowest  concentration of SARS-CoV-2 viral copies this assay can detect is 250  copies / mL. A negative result does not preclude SARS-CoV-2 infection  and should not be used as the sole basis for treatment or other  patient management decisions.  A negative result may occur  with  improper specimen collection / handling, submission of specimen other  than nasopharyngeal swab, presence of viral mutation(s) within the  areas targeted by this assay, and inadequate number of viral copies  (<250 copies / mL). A negative result must be combined with clinical  observations, patient history, and epidemiological information. If result is  POSITIVE SARS-CoV-2 target nucleic acids are DETECTED. The SARS-CoV-2 RNA is generally detectable in upper and lower  respiratory specimens dur ing the acute phase of infection.  Positive  results are indicative of active infection with SARS-CoV-2.  Clinical  correlation with patient history and other diagnostic information is  necessary to determine patient infection status.  Positive results do  not rule out bacterial infection or co-infection with other viruses. If result is PRESUMPTIVE POSTIVE SARS-CoV-2 nucleic acids MAY BE PRESENT.   A presumptive positive result was obtained on the submitted specimen  and confirmed on repeat testing.  While 2019 novel coronavirus  (SARS-CoV-2) nucleic acids may be present in the submitted sample  additional confirmatory testing may be necessary for epidemiological  and / or clinical management purposes  to differentiate between  SARS-CoV-2 and other Sarbecovirus currently known to infect humans.  If clinically indicated additional testing with an alternate test  methodology [phone]) is advised. The SARS-CoV-2 RNA is generally  detectable in upper and lower respiratory sp ecimens during the acute  phase of infection. The expected result is Negative. Fact Sheet for Patients:  StrictlyIdeas.no Fact Sheet for Healthcare Providers: BankingDealers.co.za This test is not yet approved or cleared by the [place] FDA and has been authorized for detection and/or diagnosis of SARS-CoV-2 by FDA under an Emergency Use Authorization (EUA).  This EUA will remain in effect (meaning this test can be used) for the duration of the COVID-19 declaration under Section 564(b)(1) of the Act, 21 U.S.C. section 360bbb-3(b)(1), unless the authorization is terminated or revoked sooner. Performed at [facility], [place] [address] [place], [place] [address]   Blood culture (routine x 2)     Status:  None (Preliminary result)   Collection Time: 08/07/18 12:11 PM  Result Value Ref Range Status   Specimen Description   Final    BLOOD RIGHT ANTECUBITAL Performed at [facility] [address], [place], [place] [address]    Special Requests   Final    BOTTLES DRAWN AEROBIC AND ANAEROBIC Blood Culture results may not be optimal due to an excessive volume of blood received in culture bottles Performed at [facility] [address], [place], [place] [address]    Culture   Final    NO GROWTH 2 DAYS Performed at [facility] [address], [place], [place] [address]    Report Status PENDING  Incomplete  Blood culture (routine x 2)     Status: None (Preliminary result)   Collection Time: 08/07/18 12:40 PM  Result Value Ref Range Status   Specimen Description   Final    BLOOD SITE NOT SPECIFIED Performed at [facility] [address] [place], [place] [address]    Special Requests   Final    BOTTLES DRAWN AEROBIC AND ANAEROBIC Blood Culture adequate volume Performed at [facility] [address], [place], [place] [address]    Culture   Final    NO GROWTH 2 DAYS Performed at [facility] [address] [place], [place] [address]    Report Status PENDING  Incomplete  Urine  culture     Status: None   Collection Time: 08/08/18  5:09 AM  Result Value Ref Range Status   Specimen Description   Final    URINE, RANDOM Performed at [facility] [address] [place], [place] [address]    Special Requests   Final    NONE Performed at [facility], [place] [address], [place], [place] [address]    Culture   Final    NO GROWTH Performed at [facility], [place] [address] [place],  [address]    Report Status 08/09/2018 FINAL  Final     Time coordinating discharge:  30 minutes  SIGNED:   Darliss Cheney, MD  [facility] 08/10/2018,  12:02 PM Pager [phone]  If 7PM-7AM, please contact night-coverage [email or web address] Password [email or web address]

## 2018-08-10 NOTE — Discharge Instructions (Signed)
Neutropenic Fever Neutropenic fever is a type of fever that can develop when you have a very low number of a certain kind of white blood cells called neutrophils. This condition is called neutropenia. Neutrophils are made in the spongy tissue inside the bones (bone marrow), and they help the body fight infection. When you have neutropenia, you could be in danger of a severe infection and neutropenic fever. Neutropenic fever must be treated right away with antibiotic medicines. What are the causes? This condition is caused by damage to the bone marrow or damage to neutrophils after they leave the bone marrow. Causes of neutropenia may include:  Cancer treatments.  Bone marrow cancer.  Cancer of the white blood cells (leukemia or myeloma).  Severe infection.  Bone marrow failure (aplastic anemia).  Many types of medicines.  Conditions that affect the bodys disease-fighting system (autoimmune diseases).  Genes that are passed from parent to child (inherited).  Lack (deficiency) of vitamin B.  Enlarged spleen in rheumatoid arthritis (Felty syndrome). What are the signs or symptoms? The main symptom of this condition is fever. Other symptoms include:  Chills.  Fatigue.  Painful mouth ulcers.  Cough.  Shortness of breath.  Swollen glands (lymph nodes).  Sore throat.  Sinus and ear infections.  Gum disease.  Skin infection.  Burning and frequent urination.  Rectal infections.  Vaginal discharge or itching.  Body aches. How is this diagnosed? You may be diagnosed with neutropenic fever if you have a neutrophil count of less than 500 neutrophils per microliter of blood and a fever of at least 100.27F (38.0C). You may have tests, such as:  Blood tests. These may include: ? A complete blood count (CBC) and a differential white blood count (WBC). ? Peripheral smear. This test involves checking a blood sample under a microscope.  Tests to see whether germs grow from  samples of your blood, urine, or other body fluids (cultures). These may be done to check for a source of infection.  Chest X-rays. Your health care provider will also determine whether your neutropenic fever is high risk or low risk.  You may have high-risk neutropenic fever if: ? Your neutrophil count is less than 100 neutrophils per microliter of blood. ? You have also been diagnosed with pneumonia or another serious medical problem or infection. ? Your condition requires you to be treated in the hospital. ? You are older than 60 years.  You may have low-risk neutropenic fever if: ? Your neutrophil count is more than 100 neutrophils per microliter of blood. ? Your chest X-ray is normal. ? You do not have an active illness or any other problems that require you to be in the hospital. How is this treated? Treatment for this condition may be started as soon as you get diagnosed with neutropenic fever, even if your health care provider is still looking for the source of infection.  You may have to stop taking any medicine that could be causing neutropenic fever.  If you have high-risk neutropenic fever, you will receive one or more antibiotics through an IV in the hospital.  If you have low-risk neutropenic fever, you may be treated at home. Treatment may involve: ? Taking one or two antibiotics by mouth (orally). ? Receiving IV antibiotics that are given by a health care provider who visits your home.  If your health care provider finds a specific cause of infection, you may be switched to antibiotics that work best against those bacteria. If a fungal infection  is found, your medicine will be changed to an antifungal medicine.  If your fever goes away in 3-5 days, you may have to take medicine for about 7 days. If your fever does not go away after 3-5 days, you may have to take medicine for a longer period.  If your fever is caused by cancer medicines (chemotherapy), you may need to  take a certain medicine (white blood cell growth factors) that helps prevent fever. Follow these instructions at home: Preventing infection   Take steps to prevent infections: ? Avoid contact with sick people. ? Do not eat uncooked or undercooked meats. ? Wash all fruits and vegetables before eating. ? Do not eat or drink unpasteurized dairy products. ? Get regular dental care, and maintain good dental hygiene. ? Get a flu shot (influenza vaccine). Ask your health care provider whether you need any other vaccines. ? Wear gloves when gardening. ? Wash your hands often with soap and water. If soap and water are not available, use hand sanitizer. General instructions  Take over-the-counter and prescription medicines only as told by your health care provider.  Take your antibiotic medicine as told by your health care provider. Do not stop taking the antibiotic even if you start to feel better.  Drink enough fluid to keep your urine pale yellow. This helps to prevent dehydration.  Keep all follow-up visits as told by your health care provider. This is important. If you are being treated with oral antibiotics at home, you may need to return to your health care provider every day to have your CBC checked. You may have to do this until your fever gets better. Contact a health care provider if:  You have chills.  You have a fever.  You have signs or symptoms of an infection. Get help right away if:  You have trouble breathing.  You have chest pain.  You feel faint or dizzy.  You faint. Summary  Neutropenic fever is a type of fever that can develop when you have a very low number of white blood cells called neutrophils.  Neutropenic fever must be treated right away with antibiotic medicines.  Causes of this condition include cancers of the blood and bone marrow, as well as medicines to treat cancer (chemotherapy).  Take steps to reduce your risk of infection. These may include  avoiding contact with sick people, cooking all meats thoroughly, washing fruits and vegetables before eating, and washing hands often with soap and water. This information is not intended to replace advice given to you by your health care provider. Make sure you discuss any questions you have with your health care provider. Document Released: 03/05/2013 Document Revised: 06/07/2016 Document Reviewed: 06/07/2016 Elsevier Interactive Patient Education  2019 [employer].

## 2018-08-12 LAB — CULTURE, BLOOD (ROUTINE X 2)
Culture: NO GROWTH
Culture: NO GROWTH
Special Requests: ADEQUATE

## 2018-08-13 ENCOUNTER — Telehealth: Payer: Self-pay | Admitting: *Deleted

## 2018-08-13 NOTE — Telephone Encounter (Signed)
Oncology Nurse Navigator Documentation  Oncology Nurse Navigator Flowsheets 08/13/2018  Navigator Location [facility]  Referral date to RadOnc/MedOnc -  Navigator Encounter Type Telephone;Other/I follow up with transportation services to see if patient ride was set up for tomorrow.  I was updated they spoke with patient and he is set up.  I called Daniel Bryant to make sure he understood, he states he is aware and wrote himself a note that he will be picked up tomorrow.   Telephone Outgoing Call  Abnormal Finding Date -  Confirmed Diagnosis Date -  Multidisiplinary Clinic Date -  Treatment Initiated Date -  Patient Visit Type -  Treatment Phase Treatment  Barriers/Navigation Needs Education;Coordination of Care  Education Other  Interventions Coordination of Care;Education  Coordination of Care Other  Education Method -  Acuity Level 2  Acuity Level 2 -  Time Spent with Patient 30

## 2018-08-13 NOTE — Telephone Encounter (Signed)
Oncology Nurse Navigator Documentation  Oncology Nurse Navigator Flowsheets 08/13/2018  Navigator Location [facility]  Referral date to RadOnc/MedOnc -  Navigator Encounter Type Telephone/I called patient to check on how he is doing. He states he is doing well.  I reminded him of his appt tomorrow and he stated he was unaware of appt.  He asked for transportation.  I called the transportation coordinator and she will call him and set up.    Telephone Outgoing Call  Abnormal Finding Date -  Confirmed Diagnosis Date -  Multidisiplinary Clinic Date -  Treatment Initiated Date -  Patient Visit Type -  Treatment Phase Treatment  Barriers/Navigation Needs Education;Coordination of Care  Education Other  Interventions Coordination of Care;Education  Coordination of Care Other  Education Method -  Acuity Level 2  Acuity Level 2 -  Time Spent with Patient 45

## 2018-08-14 ENCOUNTER — Other Ambulatory Visit: Payer: Self-pay

## 2018-08-14 ENCOUNTER — Encounter: Payer: Self-pay | Admitting: Internal Medicine

## 2018-08-14 ENCOUNTER — Inpatient Hospital Stay: Payer: No Typology Code available for payment source

## 2018-08-14 ENCOUNTER — Encounter: Payer: Self-pay | Admitting: *Deleted

## 2018-08-14 ENCOUNTER — Inpatient Hospital Stay (HOSPITAL_BASED_OUTPATIENT_CLINIC_OR_DEPARTMENT_OTHER): Payer: No Typology Code available for payment source | Admitting: Internal Medicine

## 2018-08-14 ENCOUNTER — Other Ambulatory Visit: Payer: Self-pay | Admitting: Internal Medicine

## 2018-08-14 ENCOUNTER — Inpatient Hospital Stay: Payer: No Typology Code available for payment source | Attending: Internal Medicine

## 2018-08-14 VITALS — BP 142/93 | HR 79 | Resp 18

## 2018-08-14 VITALS — BP 154/97 | HR 80 | Temp 98.3°F | Resp 20 | Ht 72.0 in | Wt 217.7 lb

## 2018-08-14 DIAGNOSIS — R531 Weakness: Secondary | ICD-10-CM | POA: Insufficient documentation

## 2018-08-14 DIAGNOSIS — R112 Nausea with vomiting, unspecified: Secondary | ICD-10-CM

## 2018-08-14 DIAGNOSIS — R197 Diarrhea, unspecified: Secondary | ICD-10-CM

## 2018-08-14 DIAGNOSIS — C3491 Malignant neoplasm of unspecified part of right bronchus or lung: Secondary | ICD-10-CM

## 2018-08-14 DIAGNOSIS — Z5111 Encounter for antineoplastic chemotherapy: Secondary | ICD-10-CM

## 2018-08-14 DIAGNOSIS — R5383 Other fatigue: Secondary | ICD-10-CM

## 2018-08-14 DIAGNOSIS — C3490 Malignant neoplasm of unspecified part of unspecified bronchus or lung: Secondary | ICD-10-CM

## 2018-08-14 DIAGNOSIS — E876 Hypokalemia: Secondary | ICD-10-CM

## 2018-08-14 DIAGNOSIS — K36 Other appendicitis: Secondary | ICD-10-CM

## 2018-08-14 DIAGNOSIS — C3411 Malignant neoplasm of upper lobe, right bronchus or lung: Secondary | ICD-10-CM | POA: Insufficient documentation

## 2018-08-14 DIAGNOSIS — D709 Neutropenia, unspecified: Secondary | ICD-10-CM

## 2018-08-14 DIAGNOSIS — I10 Essential (primary) hypertension: Secondary | ICD-10-CM

## 2018-08-14 LAB — CBC WITH DIFFERENTIAL (CANCER CENTER ONLY)
Abs Immature Granulocytes: 0.56 10*3/uL — ABNORMAL HIGH (ref 0.00–0.07)
Basophils Absolute: 0 10*3/uL (ref 0.0–0.1)
Basophils Relative: 1 %
Eosinophils Absolute: 0.1 10*3/uL (ref 0.0–0.5)
Eosinophils Relative: 1 %
HCT: 32.4 % — ABNORMAL LOW (ref 39.0–52.0)
Hemoglobin: 11 g/dL — ABNORMAL LOW (ref 13.0–17.0)
Immature Granulocytes: 7 %
Lymphocytes Relative: 18 %
Lymphs Abs: 1.4 10*3/uL (ref 0.7–4.0)
MCH: 29 pg (ref 26.0–34.0)
MCHC: 34 g/dL (ref 30.0–36.0)
MCV: 85.5 fL (ref 80.0–100.0)
Monocytes Absolute: 1.7 10*3/uL — ABNORMAL HIGH (ref 0.1–1.0)
Monocytes Relative: 22 %
Neutro Abs: 4.1 10*3/uL (ref 1.7–7.7)
Neutrophils Relative %: 51 %
Platelet Count: 332 10*3/uL (ref 150–400)
RBC: 3.79 MIL/uL — ABNORMAL LOW (ref 4.22–5.81)
RDW: 13.8 % (ref 11.5–15.5)
WBC Count: 7.9 10*3/uL (ref 4.0–10.5)
nRBC: 0 % (ref 0.0–0.2)

## 2018-08-14 LAB — CMP (CANCER CENTER ONLY)
ALT: 141 U/L — ABNORMAL HIGH (ref 0–44)
AST: 63 U/L — ABNORMAL HIGH (ref 15–41)
Albumin: 2.8 g/dL — ABNORMAL LOW (ref 3.5–5.0)
Alkaline Phosphatase: 150 U/L — ABNORMAL HIGH (ref 38–126)
Anion gap: 12 (ref 5–15)
BUN: 6 mg/dL (ref 6–20)
CO2: 29 mmol/L (ref 22–32)
Calcium: 8.1 mg/dL — ABNORMAL LOW (ref 8.9–10.3)
Chloride: 99 mmol/L (ref 98–111)
Creatinine: 0.84 mg/dL (ref 0.61–1.24)
GFR, Est AFR Am: >60 mL/min (ref >60)
GFR, Estimated: >60 mL/min (ref >60)
Glucose, Bld: 112 mg/dL — ABNORMAL HIGH (ref 70–99)
Potassium: 2.8 mmol/L — CL (ref 3.5–5.1)
Sodium: 140 mmol/L (ref 135–145)
Total Bilirubin: 0.3 mg/dL (ref 0.3–1.2)
Total Protein: 6.6 g/dL (ref 6.5–8.1)

## 2018-08-14 LAB — TSH: TSH: 1.393 u[IU]/mL (ref 0.320–4.118)

## 2018-08-14 MED ORDER — SODIUM CHLORIDE 0.9 % IV SOLN
Freq: Once | INTRAVENOUS | Status: DC
  Filled 2018-08-14: qty 1000

## 2018-08-14 MED ORDER — SODIUM CHLORIDE 0.9 % IV SOLN
Freq: Once | INTRAVENOUS | Status: AC
  Administered 2018-08-14: 13:00:00 via INTRAVENOUS
  Filled 2018-08-14: qty 1000

## 2018-08-14 MED ORDER — POTASSIUM CHLORIDE 10 MEQ/100ML IV SOLN: 10.0000 meq | INTRAVENOUS | Status: DC

## 2018-08-14 NOTE — Progress Notes (Signed)
Oncology Nurse Navigator Documentation  Oncology Nurse Navigator Flowsheets 08/14/2018  Navigator Location [facility]  Referral date to RadOnc/MedOnc -  Navigator Encounter Type Clinic/MDC/I spoke with patient today at the cancer center. I reminded him about his appt with Dr. Darcey Nora on 08/17/2018.  He has forgotten.  I stated we do not provide his transportation to this appt.  I asked if he was driving his car and he is not at this time.  I updated Mr. Hutmacher that I will reach out to [place] to see if they can provide transportation for him.  I called Glean Hess case manager [phone] ext [phone] to update her.  I was unable to reach but did leave a vm message with the information above.  I also left my name and phone number to call if needed.   Telephone -  Abnormal Finding Date -  Confirmed Diagnosis Date -  Multidisiplinary Clinic Date -  Treatment Initiated Date -  Patient Visit Type -  Treatment Phase Treatment  Barriers/Navigation Needs Education;Coordination of Care  Education Other  Interventions Coordination of Care;Education  Coordination of Care Other  Education Method -  Acuity Level 3  Acuity Level 2 -  Time Spent with Patient 60

## 2018-08-14 NOTE — Progress Notes (Signed)
[name] [phone]   Fax:([phone]) [place] [address]  DIAGNOSIS: Stage IV (T1b, N2, M1) non-small cell lung cancer, adenocarcinoma diagnosed in January 2020.  He presented with right upper lobe lung nodule in addition to mediastinal lymphadenopathy and malignant right pleural effusion.  PRIOR THERAPY: None  CURRENT THERAPY: Systemic chemotherapy with carboplatin for AUC of 5, Alimta 500 mg/M2 and Keytruda 200 mg IV every 3 weeks status post 2 cycles.  First dose was given on July 04, 2018.  Daniel Bryant was discontinued starting from cycle #2 because of significant skin rash as well as diarrhea.  His dose of carboplatin was also reduced to AUC of 5 and Alimta to 400 mg/M2 starting from cycle #3 secondary to intolerance.  INTERVAL HISTORY: Daniel Bryant 58 y.o. male returns to the clinic today for follow-up visit.  The patient continues to complain of increasing fatigue and weakness.  He also has few episodes of nausea and vomiting and diarrhea.  He was recently admitted to [facility] long hospital with nausea, vomiting, diarrhea and dehydration.  He had CT scan of the abdomen performed during his admission and it showed neutropenic typhlitis.  He was treated with Flagyl and Ciprofloxacin.  He is feeling much better today.  He denied having any chest pain, shortness of breath, cough or hemoptysis.  He denied having any fever or chills.  He is here today for evaluation before starting cycle #3 of his treatment.    MEDICAL HISTORY: Past Medical History:  Diagnosis Date  . Asthma   . Cancer ([place])   . COPD (chronic obstructive pulmonary disease) ([employer])   . GERD (gastroesophageal reflux disease)   . High cholesterol   . History of petit-mal seizures   . Hyperlipidemia   . Hypertension   . Memory impairment   . OSA (obstructive sleep apnea)    Does not tolerate CPAP   . Pacemaker   . PTSD (post-traumatic stress disorder)   . Sick sinus syndrome ([facility])     ALLERGIES:  is allergic to other.  MEDICATIONS:  Current Outpatient Medications  Medication Sig Dispense Refill  . albuterol (PROVENTIL) (2.5 MG/3ML) 0.083% nebulizer solution Take 3 mLs (2.5 mg total) by nebulization 2 (two) times daily as needed for wheezing or shortness of breath. 75 mL 0  . aspirin EC 81 MG EC tablet Take 1 tablet (81 mg total) by mouth daily. (Patient not taking: Reported on 08/07/2018)    . atorvastatin (LIPITOR) 40 MG tablet Take 40 mg by mouth at bedtime.    . Cholecalciferol (VITAMIN D3) 25 MCG (1000 UT) CAPS Take 1,000 Units by mouth daily.    . ciprofloxacin (CIPRO) 500 MG tablet Take 1 tablet (500 mg total) by mouth 2 (two) times daily for 13 days. 26 tablet 0  . diphenhydrAMINE (BENADRYL) 25 MG tablet Take 1 tablet (25 mg total) by mouth every 6 (six) hours as needed for itching or allergies. 20 tablet 0  . donepezil (ARICEPT) 5 MG tablet Take 5 mg by mouth at bedtime.    Marland Kitchen escitalopram (LEXAPRO) 20 MG tablet Take 20 mg by mouth daily.     . folic acid (FOLVITE) 1 MG tablet Take 1 tablet (1 mg total) by mouth daily. 30 tablet 4  . lidocaine (XYLOCAINE) 2 % solution Use as directed 5 mLs in the mouth or throat every 3 (three) hours as needed for mouth  pain. Gargle and spit (Patient not taking: Reported on 08/07/2018) 200 mL 1  . lisinopril (PRINIVIL,ZESTRIL) 5 MG tablet Take 5 mg by mouth daily.     . metroNIDAZOLE (FLAGYL) 500 MG tablet Take 1 tablet (500 mg total) by mouth every 8 (eight) hours for 13 days. 39 tablet 0  . pantoprazole (PROTONIX) 40 MG tablet Take 1 tablet (40 mg total) by mouth daily. 30 tablet 0  . potassium chloride (K-DUR) 10 MEQ tablet Take 1 tablet (10 mEq total) by mouth 3 (three) times daily for 12 doses. 12 tablet 0  . predniSONE (DELTASONE) 10 MG tablet Take 100 mg p.o. daily for 1 week followed by 80 mg p.o. daily for 1 week followed by 60 mg p.o.  daily for 1 week followed by 40 mg p.o. daily for 1 week followed by 20 mg p.o. daily for 1 weeks then 10 mg p.o. daily for 1 week. (Patient not taking: Reported on 08/07/2018) 217 tablet 0  . prochlorperazine (COMPAZINE) 10 MG tablet Take 1 tablet (10 mg total) by mouth every 6 (six) hours as needed for nausea or vomiting. (Patient not taking: Reported on 08/07/2018) 30 tablet 0  . vitamin B-12 (CYANOCOBALAMIN) 500 MCG tablet Take 500 mcg by mouth daily.     No current facility-administered medications for this visit.     SURGICAL HISTORY:  Past Surgical History:  Procedure Laterality Date  . CARDIAC SURGERY    . CHEST TUBE INSERTION Right 05/16/2018   Procedure: INSERTION PLEURAL DRAINAGE CATHETER;  Surgeon: Ivin Poot, MD;  Location: [facility];  Service: Thoracic;  Laterality: Right;  . CHEST TUBE INSERTION Right 05/16/2018   Procedure: Chest Tube Insertion;  Surgeon: Ivin Poot, MD;  Location: [facility];  Service: Thoracic;  Laterality: Right;  . PACEMAKER INSERTION      REVIEW OF SYSTEMS:  A comprehensive review of systems was negative except for: Constitutional: positive for fatigue Gastrointestinal: positive for diarrhea and nausea   PHYSICAL EXAMINATION: General appearance: alert, cooperative and no distress Head: Normocephalic, without obvious abnormality, atraumatic Neck: no adenopathy, no JVD, supple, symmetrical, trachea midline and thyroid not enlarged, symmetric, no tenderness/mass/nodules Lymph nodes: Cervical, supraclavicular, and axillary nodes normal. Resp: clear to auscultation bilaterally Back: symmetric, no curvature. ROM normal. No CVA tenderness. Cardio: regular rate and rhythm, S1, S2 normal, no murmur, click, rub or gallop GI: soft, non-tender; bowel sounds normal; no masses,  no organomegaly Extremities: extremities normal, atraumatic, no cyanosis or edema    ECOG PERFORMANCE STATUS: 1 - Symptomatic but completely ambulatory  Blood pressure (!) 154/97, pulse  80, temperature 98.3 F (36.8 C), temperature source Oral, resp. rate 20, height 6' (1.829 m), weight 217 lb 11.2 oz (98.7 kg), SpO2 97 %.  LABORATORY DATA: Lab Results  Component Value Date   WBC 7.9 08/14/2018   HGB 11.0 (L) 08/14/2018   HCT 32.4 (L) 08/14/2018   MCV 85.5 08/14/2018   PLT 332 08/14/2018      Chemistry      Component Value Date/Time   NA 134 (L) 08/10/2018 1118   K 3.1 (L) 08/10/2018 1118   CL 101 08/10/2018 1118   CO2 26 08/10/2018 1118   BUN <5 (L) 08/10/2018 1118   CREATININE 0.82 08/10/2018 1118   CREATININE 0.83 07/31/2018 1430      Component Value Date/Time   CALCIUM 7.5 (L) 08/10/2018 1118   ALKPHOS 91 08/09/2018 0818   AST 31 08/09/2018 0818   AST 50 (H) 07/31/2018 1430  ALT 27 08/09/2018 0818   ALT 42 07/31/2018 1430   BILITOT 0.6 08/09/2018 0818   BILITOT 0.6 07/31/2018 1430       RADIOGRAPHIC STUDIES: Ct Abdomen Pelvis W Contrast  Result Date: 08/07/2018 CLINICAL DATA:  Weakness, nausea, and diarrhea for the past week. Intermittent hematochezia. History of lung cancer. EXAM: CT ABDOMEN AND PELVIS WITH CONTRAST TECHNIQUE: Multidetector CT imaging of the abdomen and pelvis was performed using the standard protocol following bolus administration of intravenous contrast. CONTRAST:  174mL OMNIPAQUE IOHEXOL 300 MG/ML  SOLN COMPARISON:  PET-CT dated July 10, 2018. FINDINGS: Lower chest: Right-sided PleurX catheter again noted with trace loculated effusion. Right-sided pleural and mediastinal nodularity is grossly unchanged. Unchanged 9 x 8 mm nodule in the right lower lobe. Hepatobiliary: Unchanged small simple cyst in the hepatic dome. No other focal liver abnormality. The gallbladder is unremarkable. No biliary dilatation. Pancreas: Unremarkable. No pancreatic ductal dilatation or surrounding inflammatory changes. Spleen: Normal in size without focal abnormality. Adrenals/Urinary Tract: The adrenal glands are unremarkable. No focal renal lesion.  Unchanged punctate bilateral nonobstructive renal calculi. No hydronephrosis. The bladder is unremarkable. Stomach/Bowel: The stomach is within normal limits. There is mild circumferential wall thickening of the terminal ileum the remaining small bowel and colon are unremarkable. Normal appendix. Vascular/Lymphatic: Aortic atherosclerosis. No enlarged abdominal or pelvic lymph nodes. Reproductive: Prostate is unremarkable. Other: No abdominal wall hernia or abnormality. No abdominopelvic ascites. No pneumoperitoneum. Musculoskeletal: No acute or significant osseous findings. IMPRESSION: 1. Terminal ileitis. 2. 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 3. Unchanged punctate nonobstructive bilateral nephrolithiasis. 4.  Aortic atherosclerosis (ICD10-I70.0). Electronically Signed   By: Titus Dubin M.D.   On: 08/07/2018 14:53   Dg Chest Portable 1 View  Result Date: 08/07/2018 CLINICAL DATA:  Lung cancer, weakness EXAM: PORTABLE CHEST 1 VIEW COMPARISON:  06/12/2018 FINDINGS: Right Port-A-Cath remains in place, unchanged. Heart is normal size. Right PleurX catheter remains in place. Right pleural effusion and/or pleural thickening has increased since prior study. No pneumothorax. Right lower lobe atelectasis or infiltrate. Left lung clear. IMPRESSION: Increasing pleural density on the right which could reflect pleural effusion and/or pleural thickening. No pneumothorax. Right lower lobe atelectasis or infiltrate. Electronically Signed   By: Rolm Baptise M.D.   On: 08/07/2018 12:42    ASSESSMENT AND PLAN: This is a very pleasant 58 years old white male with metastatic non-small cell lung cancer, adenocarcinoma currently undergoing systemic chemotherapy with carboplatin, Alimta and Keytruda status post 2 cycles. Daniel Bryant was discontinued secondary to significant skin rash. I also reduced his dose of carboplatin to AUC of 4 and Alimta 400 mg/M2 starting from cycle #3 secondary to  intolerance and toxicity. I recommended for the patient to proceed with cycle #3 today as planned. I will see him back for follow-up visit in 3 weeks for evaluation with repeat CT scan of the chest for restaging of his disease. He was advised to call immediately if he has any concerning symptoms in the interval. The patient voices understanding of current disease status and treatment options and is in agreement with the current care plan.  All questions were answered. The patient knows to call the clinic with any problems, questions or concerns. We can certainly see the patient much sooner if necessary.  Disclaimer: This note was dictated with voice recognition software. Similar sounding words can inadvertently be transcribed and may not be corrected upon review.

## 2018-08-14 NOTE — Patient Instructions (Addendum)
Potassium Content of Foods  Potassium is a mineral found in many foods and drinks. It affects how the heart works, and helps keep fluids and minerals balanced in the body. The amount of potassium you need each day depends on your age and any medical conditions you may have. Talk to your health care provider or dietitian about how much potassium you need. The following lists of foods provide the general serving size for foods and the approximate amount of potassium in each serving, listed in milligrams (mg). Actual values may vary depending on the product and how it is processed. High in potassium The following foods and beverages have 200 mg or more of potassium per serving:  Apricots (raw) - 2 have 200 mg of potassium.  Apricots (dry) - 5 have 200 mg of potassium.  Artichoke - 1 medium has 345 mg of potassium.  Avocado -  fruit has 245 mg of potassium.  Banana - 1 medium fruit has 425 mg of potassium.  [place] or baked beans (canned) -  cup has 280 mg of potassium.  White beans (canned) -  cup has 595 mg potassium.  Beef roast - 3 oz has 320 mg of potassium.  Ground beef - 3 oz has 270 mg of potassium.  Beets (raw or cooked) -  cup has 260 mg of potassium.  Bran muffin - 2 oz has 300 mg of potassium.  Broccoli (cooked) -  cup has 230 mg of potassium.  Brussels sprouts -  cup has 250 mg of potassium.  Cantaloupe -  cup has 215 mg of potassium.  Cereal, 100% bran -  cup has 200-400 mg of potassium.  Cheeseburger -1 single fast food burger has 225-400 mg of potassium.  Chicken - 3 oz has 220 mg of potassium.  Clams (canned) - 3 oz has 535 mg of potassium.  Crab - 3 oz has 225 mg of potassium.  Dates - 5 have 270 mg of potassium.  Dried beans and peas -  cup has 300-475 mg of potassium.  Figs (dried) - 2 have 260 mg of potassium.  Fish (halibut, tuna, cod, snapper) - 3 oz has 480 mg of potassium.  Fish (salmon, haddock, swordfish, perch) - 3 oz has 300 mg of  potassium.  Fish (tuna, canned) - 3 oz has 200 mg of potassium.  [place] fries (fast food) - 3 oz has 470 mg of potassium.  Granola with fruit and nuts -  cup has 200 mg of potassium.  Grapefruit juice -  cup has 200 mg of potassium.  Honeydew melon -  cup has 200 mg of potassium.  Kale (raw) - 1 cup has 300 mg of potassium.  Kiwi - 1 medium fruit has 240 mg of potassium.  Kohlrabi, rutabaga, parsnips -  cup has 280 mg of potassium.  Lentils -  cup has 365 mg of potassium.  Mango - 1 each has 325 mg of potassium.  Milk (nonfat, low-fat, whole, buttermilk) - 1 cup has 350-380 mg of potassium.  Milk (chocolate) - 1 cup has 420 mg of potassium  Molasses - 1 Tbsp has 295 mg of potassium.  Mushrooms -  cup has 280 mg of potassium.  Nectarine - 1 each has 275 mg of potassium.  Nuts (almonds, peanuts, hazelnuts, [place], cashew, mixed) - 1 oz has 200 mg of potassium.  Nuts (pistachios) - 1 oz has 295 mg of potassium.  Orange - 1 fruit has 240 mg of potassium.  Orange juice -  cup has 235 mg of potassium.  Papaya -  medium fruit has 390 mg of potassium.  Peanut butter (chunky) - 2 Tbsp has 240 mg of potassium.  Peanut butter (smooth) - 2 Tbsp has 210 mg of potassium.  Pear - 1 medium (200 mg) of potassium.  Pomegranate - 1 whole fruit has 400 mg of potassium.  Pomegranate juice -  cup has 215 mg of potassium.  Pork - 3 oz has 350 mg of potassium.  Potato chips (salted) - 1 oz has 465 mg of potassium.  Potato (baked with skin) - 1 medium has 925 mg of potassium.  Potato (boiled) -  cup has 255 mg of potassium.  Potato (Mashed) -  cup has 330 mg of potassium.  Prune juice -  cup has 370 mg of potassium.  Prunes - 5 have 305 mg of potassium.  Pudding (chocolate) -  cup has 230 mg of potassium.  Pumpkin (canned) -  cup has 250 mg of potassium.  Raisins (seedless) -  cup has 270 mg of potassium.  Seeds (sunflower or pumpkin) - 1 oz has 240 mg of  potassium.  Soy milk - 1 cup has 300 mg of potassium.  Spinach (cooked) - 1/2 cup has 420 mg of potassium.  Spinach (canned) -  cup has 370 mg of potassium.  Sweet potato (baked with skin) - 1 medium has 450 mg of potassium.  Swiss chard -  cup has 480 mg of potassium.  Tomato or vegetable juice -  cup has 275 mg of potassium.  Tomato (sauce or puree) -  cup has 400-550 mg of potassium.  Tomato (raw) - 1 medium has 290 mg of potassium.  Tomato (canned) -  cup has 200-300 mg of potassium.  [place] - 3 oz has 250 mg of potassium.  Wheat germ - 1 oz has 250 mg of potassium.  Winter squash -  cup has 250 mg of potassium.  Yogurt (plain or fruited) - 6 oz has 260-435 mg of potassium.  Zucchini -  cup has 220 mg of potassium. Moderate in potassium The following foods and beverages have 50-200 mg of potassium per serving:  Apple - 1 fruit has 150 mg of potassium  Apple juice -  cup has 150 mg of potassium  Applesauce -  cup has 90 mg of potassium  Apricot nectar -  cup has 140 mg of potassium  Asparagus (small spears) -  cup has 155 mg of potassium  Asparagus (large spears) - 6 have 155 mg of potassium  Bagel (cinnamon raisin) - 1 four-inch bagel has 130 mg of potassium  Bagel (egg or plain) - 1 four- inch bagel has 70 mg of potassium  Beans (green) -  cup has 90 mg of potassium  Beans (yellow) -  cup has 190 mg of potassium  Beer, regular - 12 oz has 100 mg of potassium  Beets (canned) -  cup has 125 mg of potassium  Blackberries -  cup has 115 mg of potassium  Blueberries -  cup has 60 mg of potassium  Bread (whole wheat) - 1 slice has 70 mg of potassium  Broccoli (raw) -  cup has 145 mg of potassium  Cabbage -  cup has 150 mg of potassium  Carrots (cooked or raw) -  cup has 180 mg of potassium  Cauliflower (raw) -  cup has 150 mg of potassium  Celery (raw) -  cup has 155 mg of potassium  Cereal, bran  flakes -  cup has 120-150 mg  of potassium  Cheese (cottage) -  cup has 110 mg of potassium  Cherries - 10 have 150 mg of potassium  Chocolate - 1 oz bar has 165 mg of potassium  Coffee (brewed) - 6 oz has 90 mg of potassium  Corn -  cup or 1 ear has 195 mg of potassium  Cucumbers -  cup has 80 mg of potassium  Egg - 1 large egg has 60 mg of potassium  Eggplant -  cup has 60 mg of potassium  Endive (raw) -  cup has 80 mg of potassium  English muffin - 1 has 65 mg of potassium  Fish (ocean perch) - 3 oz has 192 mg of potassium  Frankfurter, beef or pork - 1 has 75 mg of potassium  Fruit cocktail -  cup has 115 mg of potassium  Grape juice -  cup has 170 mg of potassium  Grapefruit -  fruit has 175 mg of potassium  Grapes -  cup has 155 mg of potassium  Greens: kale, turnip, collard -  cup has 110-150 mg of potassium  Ice cream or frozen yogurt (chocolate) -  cup has 175 mg of potassium  Ice cream or frozen yogurt (vanilla) -  cup has 120-150 mg of potassium  Lemons, limes - 1 each has 80 mg of potassium  Lettuce - 1 cup has 100 mg of potassium  Mixed vegetables -  cup has 150 mg of potassium  Mushrooms, raw -  cup has 110 mg of potassium  Nuts (walnuts, pecans, or macadamia) - 1 oz has 125 mg of potassium  Oatmeal -  cup has 80 mg of potassium  Okra -  cup has 110 mg of potassium  Onions -  cup has 120 mg of potassium  Peach - 1 has 185 mg of potassium  Peaches (canned) -  cup has 120 mg of potassium  Pears (canned) -  cup has 120 mg of potassium  Peas, green (frozen) -  cup has 90 mg of potassium  Peppers (Green) -  cup has 130 mg of potassium  Peppers (Red) -  cup has 160 mg of potassium  Pineapple juice -  cup has 165 mg of potassium  Pineapple (fresh or canned) -  cup has 100 mg of potassium  Plums - 1 has 105 mg of potassium  Pudding, vanilla -  cup has 150 mg of potassium  Raspberries -  cup has 90 mg of potassium  Rhubarb -  cup has  115 mg of potassium  Rice, wild -  cup has 80 mg of potassium  Shrimp - 3 oz has 155 mg of potassium  Spinach (raw) - 1 cup has 170 mg of potassium  Strawberries -  cup has 125 mg of potassium  Summer squash -  cup has 175-200 mg of potassium  Swiss chard (raw) - 1 cup has 135 mg of potassium  Tangerines - 1 fruit has 140 mg of potassium  Tea, brewed - 6 oz has 65 mg of potassium  Turnips -  cup has 140 mg of potassium  Watermelon -  cup has 85 mg of potassium  Wine (Red, table) - 5 oz has 180 mg of potassium  Wine (White, table) - 5 oz 100 mg of potassium Low in potassium The following foods and beverages have less than 50 mg of potassium per serving.  Bread (white) - 1 slice has 30 mg of potassium  Carbonated beverages - 12 oz has less than 5 mg of potassium  Cheese - 1 oz has 20-30 mg of potassium  Cranberries -  cup has 45 mg of potassium  Cranberry juice cocktail -  cup has 20 mg of potassium  Fats and oils - 1 Tbsp has less than 5 mg of potassium  Hummus - 1 Tbsp has 32 mg of potassium  Nectar (papaya, mango, or pear) -  cup has 35 mg of potassium  Rice (white or brown) -  cup has 50 mg of potassium  Spaghetti or macaroni (cooked) -  cup has 30 mg of potassium  Tortilla, flour or corn - 1 has 50 mg of potassium  Waffle - 1 four-inch waffle has 50 mg of potassium  Water chestnuts -  cup has 40 mg of potassium Summary  Potassium is a mineral found in many foods and drinks. It affects how the heart works, and helps keep fluids and minerals balanced in the body.  The amount of potassium you need each day depends on your age and any existing medical conditions you may have. Your health care provider or dietitian may recommend an amount of potassium that you should have each day. This information is not intended to replace advice given to you by your health care provider. Make sure you discuss any questions you have with your health care  provider. Document Released: 10/12/2004 Document Revised: 05/25/2016 Document Reviewed: 05/25/2016 Elsevier Interactive Patient Education  2019 [place] (COVID-19) Are you at risk?  Are you at risk for the Coronavirus (COVID-19)?  To be considered HIGH RISK for Coronavirus (COVID-19), you have to meet the following criteria:  . Traveled to [place], [place], [place], [place] or [place]; or in the [place] to [place], [place], [place], or [place]; and have fever, cough, and shortness of breath within the last 2 weeks of travel OR . Been in close contact with a person diagnosed with COVID-19 within the last 2 weeks and have fever, cough, and shortness of breath . IF YOU DO NOT MEET THESE CRITERIA, YOU ARE CONSIDERED LOW RISK FOR COVID-19.  What to do if you are HIGH RISK for COVID-19?  Marland Kitchen If you are having a medical emergency, call 911. . Seek medical care right away. Before you go to a doctor's office, urgent care or emergency department, call ahead and tell them about your recent travel, contact with someone diagnosed with COVID-19, and your symptoms. You should receive instructions from your physician's office regarding next steps of care.  . When you arrive at healthcare provider, tell the healthcare staff immediately you have returned from visiting [place], [place], [place], [place] or [place]; or traveled in the [place] to [place], [place], [place], or [place]; in the last two weeks or you have been in close contact with a person diagnosed with COVID-19 in the last 2 weeks.   . Tell the health care staff about your symptoms: fever, cough and shortness of breath. . After you have been seen by a medical provider, you will be either: o Tested for (COVID-19) and discharged home on quarantine except to seek medical care if symptoms worsen, and asked to  - Stay home and avoid contact with others until you get your results (4-5 days)  - Avoid travel on  public transportation if possible (such as bus, train, or airplane) or o Sent to the Emergency Department by EMS for evaluation, COVID-19 testing, and  possible admission depending on your condition and test results.  What to do if you are LOW RISK for COVID-19?  Reduce your risk of any infection by using the same precautions used for avoiding the common cold or flu:  Marland Kitchen Wash your hands often with soap and warm water for at least 20 seconds.  If soap and water are not readily available, use an alcohol-based hand sanitizer with at least 60% alcohol.  . If coughing or sneezing, cover your mouth and nose by coughing or sneezing into the elbow areas of your shirt or coat, into a tissue or into your sleeve (not your hands). . Avoid shaking hands with others and consider head nods or verbal greetings only. . Avoid touching your eyes, nose, or mouth with unwashed hands.  . Avoid close contact with people who are sick. . Avoid places or events with large numbers of people in one location, like concerts or sporting events. . Carefully consider travel plans you have or are making. . If you are planning any travel outside or inside the [place], visit the CDC's Travelers' Health webpage for the latest health notices. . If you have some symptoms but not all symptoms, continue to monitor at home and seek medical attention if your symptoms worsen. . If you are having a medical emergency, call 911.   [place] / e-Visit: [email or web address]         [facility]: [facility]: [phone]                   [facility]: [phone]

## 2018-08-14 NOTE — Progress Notes (Signed)
No chemo today per MD [facility]

## 2018-08-15 ENCOUNTER — Other Ambulatory Visit: Payer: Self-pay | Admitting: Cardiothoracic Surgery

## 2018-08-15 ENCOUNTER — Ambulatory Visit: Payer: Medicaid Other | Admitting: Cardiothoracic Surgery

## 2018-08-15 ENCOUNTER — Encounter: Payer: Self-pay | Admitting: *Deleted

## 2018-08-15 DIAGNOSIS — J910 Malignant pleural effusion: Secondary | ICD-10-CM

## 2018-08-15 NOTE — Progress Notes (Signed)
Oncology Nurse Navigator Documentation  Oncology Nurse Navigator Flowsheets 08/15/2018  Navigator Location [facility]  Referral date to RadOnc/MedOnc -  Navigator Encounter Type Other/I received an email from [place], [place].  She needed an update on patient appt schedule.  I updated her on schedule.   Telephone -  Abnormal Finding Date -  Confirmed Diagnosis Date -  Multidisiplinary Clinic Date -  Treatment Initiated Date -  Patient Visit Type -  Treatment Phase Treatment  Barriers/Navigation Needs Coordination of Care  Education -  Interventions Coordination of Care  Coordination of Care Other  Education Method -  Acuity Level 1  Acuity Level 2 -  Time Spent with Patient 30

## 2018-08-16 ENCOUNTER — Other Ambulatory Visit: Payer: Self-pay

## 2018-08-17 ENCOUNTER — Encounter: Payer: Self-pay | Admitting: Cardiothoracic Surgery

## 2018-08-17 ENCOUNTER — Ambulatory Visit
Admission: RE | Admit: 2018-08-17 | Discharge: 2018-08-17 | Disposition: A | Discharge status: Home / Self Care | Payer: No Typology Code available for payment source | Source: Ambulatory Visit | Attending: Cardiothoracic Surgery | Admitting: Cardiothoracic Surgery

## 2018-08-17 ENCOUNTER — Ambulatory Visit (INDEPENDENT_AMBULATORY_CARE_PROVIDER_SITE_OTHER): Payer: Self-pay | Admitting: Cardiothoracic Surgery

## 2018-08-17 ENCOUNTER — Other Ambulatory Visit: Payer: Self-pay | Admitting: *Deleted

## 2018-08-17 VITALS — BP 158/100 | HR 93 | Temp 97.8°F | Resp 20 | Ht 72.0 in | Wt 215.0 lb

## 2018-08-17 DIAGNOSIS — J910 Malignant pleural effusion: Secondary | ICD-10-CM

## 2018-08-17 DIAGNOSIS — J90 Pleural effusion, not elsewhere classified: Secondary | ICD-10-CM

## 2018-08-17 NOTE — Progress Notes (Signed)
PCP is [facility] Referring Provider is Dorie Rank, MD  Chief Complaint  Patient presents with  . Pleural Effusion    4 week f/u with CXR, "draining pleurX every other day and not getting much"    HPI: Scheduled Pleurx catheter check for recurrent malignant left effusion, history of small cell carcinoma of the lung treated by Dr. Earlie Server.  Pleurx drainage has significantly reduced with chemotherapy.  Chest x-ray today shows minimal right pleural effusion with catheter good position.  Patient will reduce drain schedule to once a week on Mondays.  He understands that if the drainage drops less than 200 cc/week we will schedule him for outpatient removal of the catheter.   Past Medical History:  Diagnosis Date  . Asthma   . Cancer ([place])   . COPD (chronic obstructive pulmonary disease) ([place])   . GERD (gastroesophageal reflux disease)   . High cholesterol   . History of petit-mal seizures   . Hyperlipidemia   . Hypertension   . Memory impairment   . OSA (obstructive sleep apnea)    Does not tolerate CPAP  . Pacemaker   . PTSD (post-traumatic stress disorder)   . Sick sinus syndrome [facility])     Past Surgical History:  Procedure Laterality Date  . CARDIAC SURGERY    . CHEST TUBE INSERTION Right 05/16/2018   Procedure: INSERTION PLEURAL DRAINAGE CATHETER;  Surgeon: Ivin Poot, MD;  Location: [place];  Service: Thoracic;  Laterality: Right;  . CHEST TUBE INSERTION Right 05/16/2018   Procedure: Chest Tube Insertion;  Surgeon: Ivin Poot, MD;  Location: [place];  Service: Thoracic;  Laterality: Right;  . PACEMAKER INSERTION      Family History  Problem Relation Age of Onset  . Alzheimer's disease Mother   . Cancer Mother   . Memory loss Father     Social History Social History   Tobacco Use  . Smoking status: Former Smoker    Types: E-cigarettes  . Smokeless tobacco: Current User    Types: Chew  Substance Use Topics  . Alcohol use: Yes    Frequency: Never   . Drug use: No    Current Outpatient Medications  Medication Sig Dispense Refill  . albuterol (PROVENTIL) (2.5 MG/3ML) 0.083% nebulizer solution Take 3 mLs (2.5 mg total) by nebulization 2 (two) times daily as needed for wheezing or shortness of breath. 75 mL 0  . aspirin EC 81 MG EC tablet Take 1 tablet (81 mg total) by mouth daily.    Marland Kitchen atorvastatin (LIPITOR) 40 MG tablet Take 40 mg by mouth at bedtime.    . Cholecalciferol (VITAMIN D3) 25 MCG (1000 UT) CAPS Take 1,000 Units by mouth daily.    . ciprofloxacin (CIPRO) 500 MG tablet Take 1 tablet (500 mg total) by mouth 2 (two) times daily for 13 days. 26 tablet 0  . diphenhydrAMINE (BENADRYL) 25 MG tablet Take 1 tablet (25 mg total) by mouth every 6 (six) hours as needed for itching or allergies. 20 tablet 0  . donepezil (ARICEPT) 5 MG tablet Take 5 mg by mouth at bedtime.    Marland Kitchen escitalopram (LEXAPRO) 20 MG tablet Take 20 mg by mouth daily.     . folic acid (FOLVITE) 1 MG tablet Take 1 tablet (1 mg total) by mouth daily. 30 tablet 4  . lidocaine (XYLOCAINE) 2 % solution Use as directed 5 mLs in the mouth or throat every 3 (three) hours as needed for mouth pain. Gargle and spit  200 mL 1  . lisinopril (PRINIVIL,ZESTRIL) 5 MG tablet Take 5 mg by mouth daily.     . metroNIDAZOLE (FLAGYL) 500 MG tablet Take 1 tablet (500 mg total) by mouth every 8 (eight) hours for 13 days. 39 tablet 0  . pantoprazole (PROTONIX) 40 MG tablet Take 1 tablet (40 mg total) by mouth daily. 30 tablet 0  . predniSONE (DELTASONE) 10 MG tablet Take 100 mg p.o. daily for 1 week followed by 80 mg p.o. daily for 1 week followed by 60 mg p.o. daily for 1 week followed by 40 mg p.o. daily for 1 week followed by 20 mg p.o. daily for 1 weeks then 10 mg p.o. daily for 1 week. 217 tablet 0  . prochlorperazine (COMPAZINE) 10 MG tablet Take 1 tablet (10 mg total) by mouth every 6 (six) hours as needed for nausea or vomiting. 30 tablet 0  . vitamin B-12 (CYANOCOBALAMIN) 500 MCG tablet  Take 500 mcg by mouth daily.    . potassium chloride (K-DUR) 10 MEQ tablet Take 1 tablet (10 mEq total) by mouth 3 (three) times daily for 12 doses. 12 tablet 0   No current facility-administered medications for this visit.     Allergies  Allergen Reactions  . Other Other (See Comments)    Patient states "I had a scrotal procedure a couple of years ago, there was something they gave me to relax me and I started freaking out and seeing things".  Unable to ascertain from pt or his records what medication was.    Review of Systems   Patient has tolerated 2 cycles of chemotherapy for his pulmonary malignancy with minimal weight loss.  He does have some intermittent nausea usually at night.  BP (!) 158/100 (BP Location: Left Arm, Cuff Size: Normal)   Pulse 93   Temp 97.8 F (36.6 C) (Skin)   Resp 20   Ht 6' (1.829 m)   Wt 215 lb (97.5 kg)   SpO2 96% Comment: RA  BMI 29.16 kg/m  Physical Exam Alert and comfortable Lungs clear Pleurx dressing site clean and dry Heart rate regular  Diagnostic Tests: Chest x-ray image personally viewed showing catheter in good position with minimal right pleural effusion  Impression: Minimal pleural effusion If drainage remains minimal we will schedule for removal of Pleurx catheter on Monday, June 15 at [facility] as outpatient  Plan: Return 2 weeks after catheter removal for suture removal and chest x-ray   Len Childs, MD [facility] [phone]

## 2018-08-21 ENCOUNTER — Inpatient Hospital Stay: Payer: No Typology Code available for payment source

## 2018-08-21 ENCOUNTER — Other Ambulatory Visit: Payer: Self-pay

## 2018-08-21 ENCOUNTER — Other Ambulatory Visit: Payer: Self-pay | Admitting: Medical Oncology

## 2018-08-21 ENCOUNTER — Telehealth: Payer: Self-pay | Admitting: Medical Oncology

## 2018-08-21 DIAGNOSIS — C3491 Malignant neoplasm of unspecified part of right bronchus or lung: Secondary | ICD-10-CM

## 2018-08-21 DIAGNOSIS — Z5111 Encounter for antineoplastic chemotherapy: Secondary | ICD-10-CM | POA: Diagnosis not present

## 2018-08-21 DIAGNOSIS — E876 Hypokalemia: Secondary | ICD-10-CM

## 2018-08-21 LAB — CMP (CANCER CENTER ONLY)
ALT: 32 U/L (ref 0–44)
AST: 21 U/L (ref 15–41)
Albumin: 3 g/dL — ABNORMAL LOW (ref 3.5–5.0)
Alkaline Phosphatase: 139 U/L — ABNORMAL HIGH (ref 38–126)
Anion gap: 12 (ref 5–15)
BUN: 9 mg/dL (ref 6–20)
CO2: 30 mmol/L (ref 22–32)
Calcium: 8.4 mg/dL — ABNORMAL LOW (ref 8.9–10.3)
Chloride: 99 mmol/L (ref 98–111)
Creatinine: 0.85 mg/dL (ref 0.61–1.24)
GFR, Est AFR Am: >60 mL/min (ref >60)
GFR, Estimated: >60 mL/min (ref >60)
Glucose, Bld: 111 mg/dL — ABNORMAL HIGH (ref 70–99)
Potassium: 2.9 mmol/L — CL (ref 3.5–5.1)
Sodium: 141 mmol/L (ref 135–145)
Total Bilirubin: 0.4 mg/dL (ref 0.3–1.2)
Total Protein: 6.9 g/dL (ref 6.5–8.1)

## 2018-08-21 LAB — CBC WITH DIFFERENTIAL (CANCER CENTER ONLY)
Abs Immature Granulocytes: 0.02 10*3/uL (ref 0.00–0.07)
Basophils Absolute: 0.1 10*3/uL (ref 0.0–0.1)
Basophils Relative: 2 %
Eosinophils Absolute: 0.2 10*3/uL (ref 0.0–0.5)
Eosinophils Relative: 3 %
HCT: 32.8 % — ABNORMAL LOW (ref 39.0–52.0)
Hemoglobin: 11 g/dL — ABNORMAL LOW (ref 13.0–17.0)
Immature Granulocytes: 0 %
Lymphocytes Relative: 21 %
Lymphs Abs: 1.5 10*3/uL (ref 0.7–4.0)
MCH: 29.5 pg (ref 26.0–34.0)
MCHC: 33.5 g/dL (ref 30.0–36.0)
MCV: 87.9 fL (ref 80.0–100.0)
Monocytes Absolute: 0.8 10*3/uL (ref 0.1–1.0)
Monocytes Relative: 11 %
Neutro Abs: 4.7 10*3/uL (ref 1.7–7.7)
Neutrophils Relative %: 63 %
Platelet Count: 238 10*3/uL (ref 150–400)
RBC: 3.73 MIL/uL — ABNORMAL LOW (ref 4.22–5.81)
RDW: 17.2 % — ABNORMAL HIGH (ref 11.5–15.5)
WBC Count: 7.3 10*3/uL (ref 4.0–10.5)
nRBC: 0 % (ref 0.0–0.2)

## 2018-08-21 MED ORDER — POTASSIUM CHLORIDE CRYS ER 20 MEQ PO TBCR: 20.0000 meq | EXTENDED_RELEASE_TABLET | Freq: Two times a day (BID) | ORAL | 0 refills | Status: DC

## 2018-08-21 NOTE — Telephone Encounter (Signed)
I called in kdur to [address] near pts address. He will call a taxi to take him to pick it up today.

## 2018-08-27 ENCOUNTER — Encounter: Payer: Self-pay | Admitting: *Deleted

## 2018-08-27 NOTE — Progress Notes (Signed)
Oncology Nurse Navigator Documentation  Oncology Nurse Navigator Flowsheets 08/27/2018  Navigator Location [facility]  Referral Date to RadOnc/MedOnc -  Navigator Encounter Type Other/[facility]  Telephone -  Abnormal Finding Date -  Confirmed Diagnosis Date -  Multidisiplinary Clinic Date -  Treatment Initiated Date -  Patient Visit Type -  Treatment Phase Treatment  Barriers/Navigation Needs Coordination of Care  Education -  Interventions Coordination of Care  Coordination of Care Other  Education Method -  Acuity Level 1  Acuity Level 2 -  Time Spent with Patient 30

## 2018-08-28 ENCOUNTER — Inpatient Hospital Stay: Payer: No Typology Code available for payment source

## 2018-08-28 ENCOUNTER — Other Ambulatory Visit: Payer: Self-pay | Admitting: *Deleted

## 2018-08-28 ENCOUNTER — Other Ambulatory Visit: Payer: Self-pay

## 2018-08-28 ENCOUNTER — Telehealth: Payer: Self-pay | Admitting: *Deleted

## 2018-08-28 DIAGNOSIS — C3491 Malignant neoplasm of unspecified part of right bronchus or lung: Secondary | ICD-10-CM

## 2018-08-28 DIAGNOSIS — Z5111 Encounter for antineoplastic chemotherapy: Secondary | ICD-10-CM | POA: Diagnosis not present

## 2018-08-28 LAB — CBC WITH DIFFERENTIAL (CANCER CENTER ONLY)
Abs Immature Granulocytes: 0.03 10*3/uL (ref 0.00–0.07)
Basophils Absolute: 0.2 10*3/uL — ABNORMAL HIGH (ref 0.0–0.1)
Basophils Relative: 2 %
Eosinophils Absolute: 1.5 10*3/uL — ABNORMAL HIGH (ref 0.0–0.5)
Eosinophils Relative: 16 %
HCT: 32.2 % — ABNORMAL LOW (ref 39.0–52.0)
Hemoglobin: 10.7 g/dL — ABNORMAL LOW (ref 13.0–17.0)
Immature Granulocytes: 0 %
Lymphocytes Relative: 20 %
Lymphs Abs: 1.8 10*3/uL (ref 0.7–4.0)
MCH: 29.9 pg (ref 26.0–34.0)
MCHC: 33.2 g/dL (ref 30.0–36.0)
MCV: 89.9 fL (ref 80.0–100.0)
Monocytes Absolute: 0.9 10*3/uL (ref 0.1–1.0)
Monocytes Relative: 10 %
Neutro Abs: 4.6 10*3/uL (ref 1.7–7.7)
Neutrophils Relative %: 52 %
Platelet Count: 181 10*3/uL (ref 150–400)
RBC: 3.58 MIL/uL — ABNORMAL LOW (ref 4.22–5.81)
RDW: 18.6 % — ABNORMAL HIGH (ref 11.5–15.5)
WBC Count: 9 10*3/uL (ref 4.0–10.5)
nRBC: 0 % (ref 0.0–0.2)

## 2018-08-28 LAB — CMP (CANCER CENTER ONLY)
ALT: 16 U/L (ref 0–44)
AST: 21 U/L (ref 15–41)
Albumin: 3.1 g/dL — ABNORMAL LOW (ref 3.5–5.0)
Alkaline Phosphatase: 136 U/L — ABNORMAL HIGH (ref 38–126)
Anion gap: 14 (ref 5–15)
BUN: 4 mg/dL — ABNORMAL LOW (ref 6–20)
CO2: 27 mmol/L (ref 22–32)
Calcium: 8.2 mg/dL — ABNORMAL LOW (ref 8.9–10.3)
Chloride: 100 mmol/L (ref 98–111)
Creatinine: 0.9 mg/dL (ref 0.61–1.24)
GFR, Est AFR Am: >60 mL/min (ref >60)
GFR, Estimated: >60 mL/min (ref >60)
Glucose, Bld: 89 mg/dL (ref 70–99)
Potassium: 2.9 mmol/L — CL (ref 3.5–5.1)
Sodium: 141 mmol/L (ref 135–145)
Total Bilirubin: 0.4 mg/dL (ref 0.3–1.2)
Total Protein: 6.9 g/dL (ref 6.5–8.1)

## 2018-08-28 MED ORDER — POTASSIUM CHLORIDE CRYS ER 20 MEQ PO TBCR: 40.0000 meq | EXTENDED_RELEASE_TABLET | Freq: Every day | ORAL | 0 refills | Status: DC

## 2018-08-28 NOTE — Progress Notes (Signed)
Attempted call to Daniel Bryant to schedule Covid screen prior to procedure on 6/18. Unable to contact due to phone number calling restrictions.

## 2018-08-28 NOTE — Telephone Encounter (Signed)
TCT patient regarding lab results from today. Spoke with pt. Advised that his K+ is still low @ 2.9. Also informed him that K+ prescription has been sent in to his pharmacy-[facility] on [address].  He voiced understanding and will go to pick up the prescription.

## 2018-08-29 NOTE — Progress Notes (Signed)
Attempted to contact Daniel Bryant again today to schedule Rapid Covid prior to procedure 08/30/18. Unable to leave message for patient, mailbox full.

## 2018-08-30 ENCOUNTER — Encounter (HOSPITAL_COMMUNITY)
Admission: RE | Disposition: A | Discharge status: Home / Self Care | Payer: Self-pay | Source: Home / Self Care | Attending: Cardiothoracic Surgery

## 2018-08-30 ENCOUNTER — Ambulatory Visit (HOSPITAL_COMMUNITY)
Admission: RE | Admit: 2018-08-30 | Discharge: 2018-08-30 | Disposition: A | Discharge status: Home / Self Care | Payer: No Typology Code available for payment source | Attending: Physician Assistant | Admitting: Physician Assistant

## 2018-08-30 DIAGNOSIS — Z87891 Personal history of nicotine dependence: Secondary | ICD-10-CM | POA: Insufficient documentation

## 2018-08-30 DIAGNOSIS — Z809 Family history of malignant neoplasm, unspecified: Secondary | ICD-10-CM | POA: Insufficient documentation

## 2018-08-30 DIAGNOSIS — Z7982 Long term (current) use of aspirin: Secondary | ICD-10-CM | POA: Diagnosis not present

## 2018-08-30 DIAGNOSIS — E785 Hyperlipidemia, unspecified: Secondary | ICD-10-CM | POA: Diagnosis not present

## 2018-08-30 DIAGNOSIS — Z4682 Encounter for fitting and adjustment of non-vascular catheter: Secondary | ICD-10-CM | POA: Insufficient documentation

## 2018-08-30 DIAGNOSIS — I495 Sick sinus syndrome: Secondary | ICD-10-CM | POA: Diagnosis not present

## 2018-08-30 DIAGNOSIS — J90 Pleural effusion, not elsewhere classified: Secondary | ICD-10-CM

## 2018-08-30 DIAGNOSIS — E7800 Pure hypercholesterolemia, unspecified: Secondary | ICD-10-CM | POA: Diagnosis not present

## 2018-08-30 DIAGNOSIS — I10 Essential (primary) hypertension: Secondary | ICD-10-CM | POA: Insufficient documentation

## 2018-08-30 DIAGNOSIS — Z7951 Long term (current) use of inhaled steroids: Secondary | ICD-10-CM | POA: Insufficient documentation

## 2018-08-30 DIAGNOSIS — Z79899 Other long term (current) drug therapy: Secondary | ICD-10-CM | POA: Insufficient documentation

## 2018-08-30 DIAGNOSIS — J449 Chronic obstructive pulmonary disease, unspecified: Secondary | ICD-10-CM | POA: Insufficient documentation

## 2018-08-30 DIAGNOSIS — Z85118 Personal history of other malignant neoplasm of bronchus and lung: Secondary | ICD-10-CM | POA: Diagnosis not present

## 2018-08-30 DIAGNOSIS — Z9221 Personal history of antineoplastic chemotherapy: Secondary | ICD-10-CM | POA: Diagnosis not present

## 2018-08-30 DIAGNOSIS — Z820 Family history of epilepsy and other diseases of the nervous system: Secondary | ICD-10-CM | POA: Insufficient documentation

## 2018-08-30 DIAGNOSIS — F4310 Post-traumatic stress disorder, unspecified: Secondary | ICD-10-CM | POA: Diagnosis not present

## 2018-08-30 DIAGNOSIS — K219 Gastro-esophageal reflux disease without esophagitis: Secondary | ICD-10-CM | POA: Diagnosis not present

## 2018-08-30 DIAGNOSIS — G4733 Obstructive sleep apnea (adult) (pediatric): Secondary | ICD-10-CM | POA: Insufficient documentation

## 2018-08-30 DIAGNOSIS — Z950 Presence of cardiac pacemaker: Secondary | ICD-10-CM | POA: Insufficient documentation

## 2018-08-30 DIAGNOSIS — J910 Malignant pleural effusion: Secondary | ICD-10-CM | POA: Insufficient documentation

## 2018-08-30 HISTORY — PX: REMOVAL OF PLEURAL DRAINAGE CATHETER: SHX5080

## 2018-08-30 SURGERY — REMOVAL, CLOSED DRAINAGE CATHETER SYSTEM, PLEURAL
Anesthesia: Monitor Anesthesia Care | Laterality: Right

## 2018-08-30 MED ORDER — LIDOCAINE HCL (PF) 1 % IJ SOLN
INTRAMUSCULAR | Status: AC
  Filled 2018-08-30: qty 30

## 2018-08-30 NOTE — Procedures (Signed)
Daniel Bryant [id number] [birth year]   Pre Procedure Diagnosis: Right Malignant Pleural Effusion Post Procedure Diagnosis: Same  Procedure: Right Chest Tube Removal   Informed consent was obtained.  Time out was performed.  The patient's right chest was prepped and draped.  Local anesthesia was applied using 1% lidocaine without epinephrine.  Once anesthesia was achieved blunt dissection was performed with lysis of skin adhesions.  This was done without difficulty.  Once catheter was free, the tube was removed.  Pressure was applied for minor bleeding of the skin edges.  The wound was closed with 3, interrupted 3-0 Nylon Suture.  A clean dry dressing was applied.    The patient tolerated the procedure without difficulty.     Blood Loss Minimal  Disposition: Home in stable condition   Ellwood Handler, PA-C

## 2018-08-30 NOTE — Progress Notes (Signed)
PA Erin Barrett made aware of elevated bp's prior to the minor procedure.

## 2018-08-30 NOTE — Progress Notes (Signed)
PT DISCHARGED. INSTRUCTIONS GIVEN. AAOX4. PT IN NO APPARENT DISTRESS WITH MILD PAIN. THE OPPORTUNITY TO ASK QUESTIONS WAS PROVIDED. PT ESCORTED TO THE MAIN ENTRANCE VIA WHEELCHAIR.

## 2018-08-31 ENCOUNTER — Telehealth: Payer: Self-pay | Admitting: *Deleted

## 2018-08-31 NOTE — Telephone Encounter (Signed)
Oncology Nurse Navigator Documentation  Oncology Nurse Navigator Flowsheets 08/31/2018  Navigator Location [facility]  Referral Date to RadOnc/MedOnc -  Navigator Encounter Type Telephone/I called Daniel Bryant to check on how he is doing with his pleur x cath out.  He was very happy and I listened as he explained.  I also reminded him on his appt for next week. He states he is aware of appt time and place.  [facility]  Telephone Outgoing Call  Abnormal Finding Date -  Confirmed Diagnosis Date -  Multidisiplinary Clinic Date -  Treatment Initiated Date -  Patient Visit Type -  Treatment Phase Treatment  Barriers/Navigation Needs Coordination of Care;Education  Education Other  Interventions Coordination of Care;Education  Coordination of Care Other  Education Method Verbal  Acuity Level 2  Acuity Level 2 -  Time Spent with Patient 30

## 2018-09-03 ENCOUNTER — Ambulatory Visit (HOSPITAL_COMMUNITY): Admission: RE | Admit: 2018-09-03 | Payer: No Typology Code available for payment source | Source: Ambulatory Visit

## 2018-09-03 ENCOUNTER — Encounter (HOSPITAL_COMMUNITY): Payer: Self-pay | Admitting: Cardiothoracic Surgery

## 2018-09-04 ENCOUNTER — Encounter: Payer: Self-pay | Admitting: *Deleted

## 2018-09-04 ENCOUNTER — Encounter: Payer: Self-pay | Admitting: Internal Medicine

## 2018-09-04 ENCOUNTER — Inpatient Hospital Stay: Payer: No Typology Code available for payment source

## 2018-09-04 ENCOUNTER — Other Ambulatory Visit: Payer: Self-pay

## 2018-09-04 ENCOUNTER — Inpatient Hospital Stay (HOSPITAL_BASED_OUTPATIENT_CLINIC_OR_DEPARTMENT_OTHER): Payer: No Typology Code available for payment source | Admitting: Internal Medicine

## 2018-09-04 ENCOUNTER — Other Ambulatory Visit: Payer: Self-pay | Admitting: Medical Oncology

## 2018-09-04 VITALS — BP 145/94 | HR 88

## 2018-09-04 VITALS — BP 152/101 | HR 97 | Temp 98.5°F | Resp 22 | Ht 72.0 in | Wt 220.4 lb

## 2018-09-04 DIAGNOSIS — I10 Essential (primary) hypertension: Secondary | ICD-10-CM

## 2018-09-04 DIAGNOSIS — C3411 Malignant neoplasm of upper lobe, right bronchus or lung: Secondary | ICD-10-CM | POA: Diagnosis not present

## 2018-09-04 DIAGNOSIS — C3491 Malignant neoplasm of unspecified part of right bronchus or lung: Secondary | ICD-10-CM

## 2018-09-04 DIAGNOSIS — E876 Hypokalemia: Secondary | ICD-10-CM | POA: Diagnosis not present

## 2018-09-04 DIAGNOSIS — Z5111 Encounter for antineoplastic chemotherapy: Secondary | ICD-10-CM

## 2018-09-04 DIAGNOSIS — J449 Chronic obstructive pulmonary disease, unspecified: Secondary | ICD-10-CM

## 2018-09-04 LAB — CBC WITH DIFFERENTIAL (CANCER CENTER ONLY)
Abs Immature Granulocytes: 0.02 10*3/uL (ref 0.00–0.07)
Basophils Absolute: 0.1 10*3/uL (ref 0.0–0.1)
Basophils Relative: 2 %
Eosinophils Absolute: 1.2 10*3/uL — ABNORMAL HIGH (ref 0.0–0.5)
Eosinophils Relative: 17 %
HCT: 37.1 % — ABNORMAL LOW (ref 39.0–52.0)
Hemoglobin: 12.2 g/dL — ABNORMAL LOW (ref 13.0–17.0)
Immature Granulocytes: 0 %
Lymphocytes Relative: 22 %
Lymphs Abs: 1.5 10*3/uL (ref 0.7–4.0)
MCH: 30.4 pg (ref 26.0–34.0)
MCHC: 32.9 g/dL (ref 30.0–36.0)
MCV: 92.5 fL (ref 80.0–100.0)
Monocytes Absolute: 0.7 10*3/uL (ref 0.1–1.0)
Monocytes Relative: 10 %
Neutro Abs: 3.6 10*3/uL (ref 1.7–7.7)
Neutrophils Relative %: 49 %
Platelet Count: 158 10*3/uL (ref 150–400)
RBC: 4.01 MIL/uL — ABNORMAL LOW (ref 4.22–5.81)
RDW: 19.6 % — ABNORMAL HIGH (ref 11.5–15.5)
WBC Count: 7.1 10*3/uL (ref 4.0–10.5)
nRBC: 0 % (ref 0.0–0.2)

## 2018-09-04 LAB — CMP (CANCER CENTER ONLY)
ALT: 19 U/L (ref 0–44)
AST: 30 U/L (ref 15–41)
Albumin: 3.3 g/dL — ABNORMAL LOW (ref 3.5–5.0)
Alkaline Phosphatase: 124 U/L (ref 38–126)
Anion gap: 13 (ref 5–15)
BUN: 6 mg/dL (ref 6–20)
CO2: 24 mmol/L (ref 22–32)
Calcium: 8.5 mg/dL — ABNORMAL LOW (ref 8.9–10.3)
Chloride: 105 mmol/L (ref 98–111)
Creatinine: 0.91 mg/dL (ref 0.61–1.24)
GFR, Est AFR Am: >60 mL/min (ref >60)
GFR, Estimated: >60 mL/min (ref >60)
Glucose, Bld: 110 mg/dL — ABNORMAL HIGH (ref 70–99)
Potassium: 3.2 mmol/L — ABNORMAL LOW (ref 3.5–5.1)
Sodium: 142 mmol/L (ref 135–145)
Total Bilirubin: 0.4 mg/dL (ref 0.3–1.2)
Total Protein: 7.3 g/dL (ref 6.5–8.1)

## 2018-09-04 LAB — TSH: TSH: 1.34 u[IU]/mL (ref 0.320–4.118)

## 2018-09-04 MED ORDER — SODIUM CHLORIDE 0.9 % IV SOLN
600.0000 mg | Freq: Once | INTRAVENOUS | Status: AC
  Administered 2018-09-04: 600 mg via INTRAVENOUS
  Filled 2018-09-04: qty 60

## 2018-09-04 MED ORDER — SODIUM CHLORIDE 0.9 % IV SOLN
Freq: Once | INTRAVENOUS | Status: AC
  Administered 2018-09-04: 11:00:00 via INTRAVENOUS
  Filled 2018-09-04: qty 5

## 2018-09-04 MED ORDER — PALONOSETRON HCL INJECTION 0.25 MG/5ML
0.2500 mg | Freq: Once | INTRAVENOUS | Status: AC
  Administered 2018-09-04: 0.25 mg via INTRAVENOUS

## 2018-09-04 MED ORDER — POTASSIUM CHLORIDE 10 MEQ/100ML IV SOLN
10.0000 meq | INTRAVENOUS | Status: AC
  Administered 2018-09-04 (×2): 10 meq via INTRAVENOUS
  Filled 2018-09-04: qty 100

## 2018-09-04 MED ORDER — CLONIDINE HCL 0.1 MG PO TABS
0.1000 mg | ORAL_TABLET | ORAL | Status: AC
  Administered 2018-09-04: 0.1 mg via ORAL

## 2018-09-04 MED ORDER — CYANOCOBALAMIN 1000 MCG/ML IJ SOLN
1000.0000 ug | Freq: Once | INTRAMUSCULAR | Status: AC
  Administered 2018-09-04: 1000 ug via INTRAMUSCULAR

## 2018-09-04 MED ORDER — CLONIDINE HCL 0.1 MG PO TABS
ORAL_TABLET | ORAL | Status: AC
  Filled 2018-09-04: qty 1

## 2018-09-04 MED ORDER — SODIUM CHLORIDE 0.9 % IV SOLN
Freq: Once | INTRAVENOUS | Status: AC
  Administered 2018-09-04: 11:00:00 via INTRAVENOUS
  Filled 2018-09-04: qty 250

## 2018-09-04 MED ORDER — SODIUM CHLORIDE 0.9 % IV SOLN
380.0000 mg/m2 | Freq: Once | INTRAVENOUS | Status: AC
  Administered 2018-09-04: 900 mg via INTRAVENOUS
  Filled 2018-09-04: qty 20

## 2018-09-04 NOTE — Patient Instructions (Signed)
[facility] Discharge Instructions for Patients Receiving Chemotherapy  Today you received the following chemotherapy agents Alimta, Carboplatin and other agent: Potassium  To help prevent nausea and vomiting after your treatment, we encourage you to take your nausea medication as directed by your MD.   If you develop nausea and vomiting that is not controlled by your nausea medication, call the clinic.   BELOW ARE SYMPTOMS THAT SHOULD BE REPORTED IMMEDIATELY:  *FEVER GREATER THAN 100.5 F  *CHILLS WITH OR WITHOUT FEVER  NAUSEA AND VOMITING THAT IS NOT CONTROLLED WITH YOUR NAUSEA MEDICATION  *UNUSUAL SHORTNESS OF BREATH  *UNUSUAL BRUISING OR BLEEDING  TENDERNESS IN MOUTH AND THROAT WITH OR WITHOUT PRESENCE OF ULCERS  *URINARY PROBLEMS  *BOWEL PROBLEMS  UNUSUAL RASH Items with * indicate a potential emergency and should be followed up as soon as possible.  Feel free to call the clinic should you have any questions or concerns. The clinic phone number is [phone].  Please show the [place] at check-in to the Emergency Department and triage nurse.  Coronavirus (COVID-19) Are you at risk?  Are you at risk for the Coronavirus (COVID-19)?  To be considered HIGH RISK for Coronavirus (COVID-19), you have to meet the following criteria:   Traveled to [place], [place], [place], [place] or [place]; or in the [place] to [place], [place], [place], or [place]; and have fever, cough, and shortness of breath within the last 2 weeks of travel OR  Been in close contact with a person diagnosed with COVID-19 within the last 2 weeks and have fever, cough, and shortness of breath  IF YOU DO NOT MEET THESE CRITERIA, YOU ARE CONSIDERED LOW RISK FOR COVID-19.  What to do if you are HIGH RISK for COVID-19?   If you are having a medical emergency, call 911.  Seek medical care right away. Before you go to a doctors office, urgent care or emergency  department, call ahead and tell them about your recent travel, contact with someone diagnosed with COVID-19, and your symptoms. You should receive instructions from your physicians office regarding next steps of care.   When you arrive at healthcare provider, tell the healthcare staff immediately you have returned from visiting [place], [place], [place], [place] or [place]; or traveled in the [place] to [place], [place], [place], or [place]; in the last two weeks or you have been in close contact with a person diagnosed with COVID-19 in the last 2 weeks.    Tell the health care staff about your symptoms: fever, cough and shortness of breath.  After you have been seen by a medical provider, you will be either: o Tested for (COVID-19) and discharged home on quarantine except to seek medical care if symptoms worsen, and asked to  - Stay home and avoid contact with others until you get your results (4-5 days)  - Avoid travel on public transportation if possible (such as bus, train, or airplane) or o Sent to the Emergency Department by EMS for evaluation, COVID-19 testing, and possible admission depending on your condition and test results.  What to do if you are LOW RISK for COVID-19?  Reduce your risk of any infection by using the same precautions used for avoiding the common cold or flu:   Wash your hands often with soap and warm water for at least 20 seconds.  If soap and water are not readily available, use an alcohol-based hand sanitizer with at least  60% alcohol.   If coughing or sneezing, cover your mouth and nose by coughing or sneezing into the elbow areas of your shirt or coat, into a tissue or into your sleeve (not your hands).  Avoid shaking hands with others and consider head nods or verbal greetings only.  Avoid touching your eyes, nose, or mouth with unwashed hands.   Avoid close contact with people who are sick.  Avoid places or events with large numbers of people  in one location, like concerts or sporting events.  Carefully consider travel plans you have or are making.  If you are planning any travel outside or inside the [place], visit the [facility] webpage for the latest health notices.  If you have some symptoms but not all symptoms, continue to monitor at home and seek medical attention if your symptoms worsen.  If you are having a medical emergency, call 911.   [place] / e-Visit: [email or web address]         [facility]: [phone]  [facility]: [phone]                   [facility]: [phone]  Hypokalemia Hypokalemia means that the amount of potassium in the blood is lower than normal.Potassium is a chemical that helps regulate the amount of fluid in the body (electrolyte). It also stimulates muscle tightening (contraction) and helps nerves work properly.Normally, most of the bodys potassium is inside of cells, and only a very small amount is in the blood. Because the amount in the blood is so small, minor changes to potassium levels in the blood can be life-threatening. What are the causes? This condition may be caused by:  Antibiotic medicine.  Diarrhea or vomiting. Taking too much of a medicine that helps you have a bowel movement (laxative) can cause diarrhea and lead to hypokalemia.  Chronic kidney disease (CKD).  Medicines that help the body get rid of excess fluid (diuretics).  Eating disorders, such as bulimia.  Low magnesium levels in the body.  Sweating a lot. What are the signs or symptoms? Symptoms of this condition include:  Weakness.  Constipation.  Fatigue.  Muscle cramps.  Mental confusion.  Skipped heartbeats or irregular heartbeat (palpitations).  Tingling or numbness. How is this diagnosed? This condition is diagnosed with a blood  test. How is this treated? Hypokalemia can be treated by taking potassium supplements by mouth or adjusting the medicines that you take. Treatment may also include eating more foods that contain a lot of potassium. If your potassium level is very low, you may need to get potassium through an IV tube in one of your veins and be monitored in the hospital. Follow these instructions at home:   Take over-the-counter and prescription medicines only as told by your health care provider. This includes vitamins and supplements.  Eat a healthy diet. A healthy diet includes fresh fruits and vegetables, whole grains, healthy fats, and lean proteins.  If instructed, eat more foods that contain a lot of potassium, such as: ? Nuts, such as peanuts and pistachios. ? Seeds, such as sunflower seeds and pumpkin seeds. ? Peas, lentils, and lima beans. ? Whole grain and bran cereals and breads. ? Fresh fruits and vegetables, such as apricots, avocado, bananas, cantaloupe, kiwi, oranges, tomatoes, asparagus, and potatoes. ? Orange juice. ? Tomato juice. ? Red meats. ? Yogurt.  Keep all follow-up visits as told by your health  care provider. This is important. Contact a health care provider if:  You have weakness that gets worse.  You feel your heart pounding or racing.  You vomit.  You have diarrhea.  You have diabetes (diabetes mellitus) and you have trouble keeping your blood sugar (glucose) in your target range. Get help right away if:  You have chest pain.  You have shortness of breath.  You have vomiting or diarrhea that lasts for more than 2 days.  You faint. This information is not intended to replace advice given to you by your health care provider. Make sure you discuss any questions you have with your health care provider. Document Released: 02/28/2005 Document Revised: 10/17/2015 Document Reviewed: 10/17/2015 Elsevier Interactive Patient Education  2019 [employer].

## 2018-09-04 NOTE — Addendum Note (Signed)
Addended by: Ardeen Garland on: 09/04/2018 09:41 AM   Modules accepted: Orders

## 2018-09-04 NOTE — Progress Notes (Signed)
Daniel Bryant [phone]   Fax:(336) [place] [address]  DIAGNOSIS: Stage IV (T1b, N2, M1) non-small cell lung cancer, adenocarcinoma diagnosed in January 2020.  He presented with right upper lobe lung nodule in addition to mediastinal lymphadenopathy and malignant right pleural effusion.  PRIOR THERAPY: None  CURRENT THERAPY: Systemic chemotherapy with carboplatin for AUC of 5, Alimta 500 mg/M2 and Keytruda 200 mg IV every 3 weeks status post 2 cycles.  First dose was given on July 04, 2018.  [name] was discontinued starting from cycle #2 because of significant skin rash as well as diarrhea.  His dose of carboplatin was also reduced to AUC of 5 and Alimta to 400 mg/M2 starting from cycle #3 secondary to intolerance.  INTERVAL HISTORY: Daniel Bryant 58 y.o. male returns to the clinic today for follow-up visit.  The patient is feeling fine today with no concerning complaints.  He had the Pleurx catheter removed recently.  Having any chest pain, shortness of breath, cough or hemoptysis.  He denied having any fever or chills.  He has no nausea, vomiting, diarrhea or constipation.  He missed cycle #3 of his treatment because of the elevated liver enzymes.  He is here today for evaluation before starting cycle #3.    MEDICAL HISTORY: Past Medical History:  Diagnosis Date   Asthma    Cancer ([facility])    COPD (chronic obstructive pulmonary disease) (HCC)    GERD (gastroesophageal reflux disease)    High cholesterol    History of petit-mal seizures    Hyperlipidemia    Hypertension    Memory impairment    OSA (obstructive sleep apnea)    Does not tolerate CPAP   Pacemaker    PTSD (post-traumatic stress disorder)    Sick sinus syndrome (HCC)     ALLERGIES:  is allergic to other.  MEDICATIONS:  Current Outpatient Medications  Medication Sig  Dispense Refill   albuterol (PROVENTIL) (2.5 MG/3ML) 0.083% nebulizer solution Take 3 mLs (2.5 mg total) by nebulization 2 (two) times daily as needed for wheezing or shortness of breath. 75 mL 0   aspirin EC 81 MG EC tablet Take 1 tablet (81 mg total) by mouth daily.     atorvastatin (LIPITOR) 40 MG tablet Take 40 mg by mouth at bedtime.     Cholecalciferol (VITAMIN D3) 25 MCG (1000 UT) CAPS Take 1,000 Units by mouth daily.     diphenhydrAMINE (BENADRYL) 25 MG tablet Take 1 tablet (25 mg total) by mouth every 6 (six) hours as needed for itching or allergies. 20 tablet 0   donepezil (ARICEPT) 5 MG tablet Take 5 mg by mouth at bedtime.     escitalopram (LEXAPRO) 20 MG tablet Take 20 mg by mouth daily.      folic acid (FOLVITE) 1 MG tablet Take 1 tablet (1 mg total) by mouth daily. 30 tablet 4   lidocaine (XYLOCAINE) 2 % solution Use as directed 5 mLs in the mouth or throat every 3 (three) hours as needed for mouth pain. Gargle and spit 200 mL 1   pantoprazole (PROTONIX) 40 MG tablet Take 1 tablet (40 mg total) by mouth daily. (Patient taking differently: Take 40 mg by mouth daily as needed (acid reflux). ) 30 tablet 0   potassium chloride (K-DUR) 10 MEQ tablet Take 1 tablet (10 mEq total) by mouth 3 (three) times daily for 12 doses. 12  tablet 0   potassium chloride SA (K-DUR) 20 MEQ tablet Take 2 tablets (40 mEq total) by mouth daily. 20 tablet 0   prochlorperazine (COMPAZINE) 10 MG tablet Take 1 tablet (10 mg total) by mouth every 6 (six) hours as needed for nausea or vomiting. 30 tablet 0   No current facility-administered medications for this visit.     SURGICAL HISTORY:  Past Surgical History:  Procedure Laterality Date   CARDIAC SURGERY     CHEST TUBE INSERTION Right 05/16/2018   Procedure: INSERTION PLEURAL DRAINAGE CATHETER;  Surgeon: Ivin Poot, MD;  Location: [facility];  Service: Thoracic;  Laterality: Right;   CHEST TUBE INSERTION Right 05/16/2018   Procedure: Chest  Tube Insertion;  Surgeon: Ivin Poot, MD;  Location: [facility];  Service: Thoracic;  Laterality: Right;   PACEMAKER INSERTION     REMOVAL OF PLEURAL DRAINAGE CATHETER Right 08/30/2018   Procedure: REMOVAL OF PLEURAL DRAINAGE CATHETER;  Surgeon: Ivin Poot, MD;  Location: [facility];  Service: Thoracic;  Laterality: Right;    REVIEW OF SYSTEMS:  A comprehensive review of systems was negative except for: Constitutional: positive for fatigue   PHYSICAL EXAMINATION: General appearance: alert, cooperative and no distress Head: Normocephalic, without obvious abnormality, atraumatic Neck: no adenopathy, no JVD, supple, symmetrical, trachea midline and thyroid not enlarged, symmetric, no tenderness/mass/nodules Lymph nodes: Cervical, supraclavicular, and axillary nodes normal. Resp: clear to auscultation bilaterally Back: symmetric, no curvature. ROM normal. No CVA tenderness. Cardio: regular rate and rhythm, S1, S2 normal, no murmur, click, rub or gallop GI: soft, non-tender; bowel sounds normal; no masses,  no organomegaly Extremities: extremities normal, atraumatic, no cyanosis or edema    ECOG PERFORMANCE STATUS: 1 - Symptomatic but completely ambulatory  Blood pressure (!) 152/101, pulse 97, temperature 98.5 F (36.9 C), temperature source Oral, resp. rate (!) 22, height 6' (1.829 m), weight 220 lb 6.4 oz (100 kg), SpO2 95 %.  LABORATORY DATA: Lab Results  Component Value Date   WBC 7.1 09/04/2018   HGB 12.2 (L) 09/04/2018   HCT 37.1 (L) 09/04/2018   MCV 92.5 09/04/2018   PLT 158 09/04/2018      Chemistry      Component Value Date/Time   NA 141 08/28/2018 1432   K 2.9 (LL) 08/28/2018 1432   CL 100 08/28/2018 1432   CO2 27 08/28/2018 1432   BUN 4 (L) 08/28/2018 1432   CREATININE 0.90 08/28/2018 1432      Component Value Date/Time   CALCIUM 8.2 (L) 08/28/2018 1432   ALKPHOS 136 (H) 08/28/2018 1432   AST 21 08/28/2018 1432   ALT 16 08/28/2018 1432   BILITOT 0.4  08/28/2018 1432       RADIOGRAPHIC STUDIES: Dg Chest 2 View  Result Date: 08/17/2018 CLINICAL DATA:  Malignant pleural effusion. Right chest tube in place. EXAM: CHEST - 2 VIEW COMPARISON:  Chest x-rays dated 08/07/2018 and 05/15/2018 and PET-CT dated 07/10/2018 FINDINGS: Chest tube in place at the right lung base. The chronic pleural thickening and loculated small right pleural effusion are essentially unchanged. No pneumothorax. The heart size and pulmonary vascularity are. Left lung is clear. Pacemaker in place. IMPRESSION: Stable appearance of the chest.  Chest tube in good position. Electronically Signed   By: Lorriane Shire M.D.   On: 08/17/2018 11:22   Ct Abdomen Pelvis W Contrast  Result Date: 08/07/2018 CLINICAL DATA:  Weakness, nausea, and diarrhea for the past week. Intermittent hematochezia. History of lung cancer. EXAM: CT  ABDOMEN AND PELVIS WITH CONTRAST TECHNIQUE: Multidetector CT imaging of the abdomen and pelvis was performed using the standard protocol following bolus administration of intravenous contrast. CONTRAST:  138mL OMNIPAQUE IOHEXOL 300 MG/ML  SOLN COMPARISON:  PET-CT dated July 10, 2018. FINDINGS: Lower chest: Right-sided PleurX catheter again noted with trace loculated effusion. Right-sided pleural and mediastinal nodularity is grossly unchanged. Unchanged 9 x 8 mm nodule in the right lower lobe. Hepatobiliary: Unchanged small simple cyst in the hepatic dome. No other focal liver abnormality. The gallbladder is unremarkable. No biliary dilatation. Pancreas: Unremarkable. No pancreatic ductal dilatation or surrounding inflammatory changes. Spleen: Normal in size without focal abnormality. Adrenals/Urinary Tract: The adrenal glands are unremarkable. No focal renal lesion. Unchanged punctate bilateral nonobstructive renal calculi. No hydronephrosis. The bladder is unremarkable. Stomach/Bowel: The stomach is within normal limits. There is mild circumferential wall thickening of  the terminal ileum the remaining small bowel and colon are unremarkable. Normal appendix. Vascular/Lymphatic: Aortic atherosclerosis. No enlarged abdominal or pelvic lymph nodes. Reproductive: Prostate is unremarkable. Other: No abdominal wall hernia or abnormality. No abdominopelvic ascites. No pneumoperitoneum. Musculoskeletal: No acute or significant osseous findings. IMPRESSION: 1. Terminal ileitis. 2. 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 3. Unchanged punctate nonobstructive bilateral nephrolithiasis. 4.  Aortic atherosclerosis (ICD10-I70.0). Electronically Signed   By: Titus Dubin M.D.   On: 08/07/2018 14:53   Dg Chest Portable 1 View  Result Date: 08/07/2018 CLINICAL DATA:  Lung cancer, weakness EXAM: PORTABLE CHEST 1 VIEW COMPARISON:  06/12/2018 FINDINGS: Right Port-A-Cath remains in place, unchanged. Heart is normal size. Right PleurX catheter remains in place. Right pleural effusion and/or pleural thickening has increased since prior study. No pneumothorax. Right lower lobe atelectasis or infiltrate. Left lung clear. IMPRESSION: Increasing pleural density on the right which could reflect pleural effusion and/or pleural thickening. No pneumothorax. Right lower lobe atelectasis or infiltrate. Electronically Signed   By: Rolm Baptise M.D.   On: 08/07/2018 12:42    ASSESSMENT AND PLAN: This is a very pleasant 58 years old white male with metastatic non-small cell lung cancer, adenocarcinoma currently undergoing systemic chemotherapy with carboplatin, Alimta and Keytruda status post 2 cycles. [name] was discontinued secondary to significant skin rash. I also reduced his dose of carboplatin to AUC of 4 and Alimta 400 mg/M2 starting from cycle #3 secondary to intolerance and toxicity. He missed cycle #3 three weeks ago because of the elevated liver enzyme and hypokalemia. I recommended for the patient to resume his treatment with cycle #3 today. I will see him back  for follow-up visit in 3 weeks for evaluation with repeat CT scan of the chest for restaging of his disease. For the hypokalemia, I will arrange for the patient to receive 20 mEq of potassium chloride intravenously in the clinic today. The patient was advised to call immediately if he has any concerning symptoms in the interval. The patient voices understanding of current disease status and treatment options and is in agreement with the current care plan.  All questions were answered. The patient knows to call the clinic with any problems, questions or concerns. We can certainly see the patient much sooner if necessary.  Disclaimer: This note was dictated with voice recognition software. Similar sounding words can inadvertently be transcribed and may not be corrected upon review.

## 2018-09-04 NOTE — Progress Notes (Signed)
Oncology Nurse Navigator Documentation  Oncology Nurse Navigator Flowsheets 09/04/2018  Navigator Location [facility]  Referral Date to RadOnc/MedOnc -  Navigator Encounter Type Clinic/MDC/I spoke with patient today at cancer center.  He missed his planned ct scan.  I was able to get him a new schedule for his ct scan.  I updated Daniel Bryant on his appt and pre-procedure instructions. He verbalized understanding.  [facility]  Telephone -  Abnormal Finding Date -  Confirmed Diagnosis Date -  Multidisiplinary Clinic Date -  Treatment Initiated Date -  Patient Visit Type -  Treatment Phase Treatment  Barriers/Navigation Needs Coordination of Care;Education  Education Other  Interventions Coordination of Care;Education  Coordination of Care Appts  Education Method Verbal;Written  Acuity Level 3  Acuity Level 2 -  Time Spent with Patient 60

## 2018-09-04 NOTE — Progress Notes (Signed)
Hypertension- Per Daniel Bryant he ordered clonidine 0.1 mg and recheck BP in 20 minutes after Clonidine. Call repeat BP. [facility]. Per Dr Daniel Bryant it is okay to treat pt today with Carbo,keytruda and alimta and BP of 145/94

## 2018-09-05 ENCOUNTER — Telehealth: Payer: Self-pay | Admitting: Internal Medicine

## 2018-09-05 NOTE — Telephone Encounter (Signed)
Added additional cycles per 6/23 los - pt to get an updated schedule next visit.

## 2018-09-11 ENCOUNTER — Other Ambulatory Visit: Payer: Self-pay

## 2018-09-11 ENCOUNTER — Inpatient Hospital Stay: Payer: No Typology Code available for payment source

## 2018-09-11 DIAGNOSIS — Z5111 Encounter for antineoplastic chemotherapy: Secondary | ICD-10-CM | POA: Diagnosis not present

## 2018-09-11 DIAGNOSIS — C3491 Malignant neoplasm of unspecified part of right bronchus or lung: Secondary | ICD-10-CM

## 2018-09-11 LAB — CBC WITH DIFFERENTIAL (CANCER CENTER ONLY)
Abs Immature Granulocytes: 0.01 10*3/uL (ref 0.00–0.07)
Basophils Absolute: 0 10*3/uL (ref 0.0–0.1)
Basophils Relative: 1 %
Eosinophils Absolute: 0.5 10*3/uL (ref 0.0–0.5)
Eosinophils Relative: 13 %
HCT: 35.9 % — ABNORMAL LOW (ref 39.0–52.0)
Hemoglobin: 12.1 g/dL — ABNORMAL LOW (ref 13.0–17.0)
Immature Granulocytes: 0 %
Lymphocytes Relative: 33 %
Lymphs Abs: 1.3 10*3/uL (ref 0.7–4.0)
MCH: 30.5 pg (ref 26.0–34.0)
MCHC: 33.7 g/dL (ref 30.0–36.0)
MCV: 90.4 fL (ref 80.0–100.0)
Monocytes Absolute: 0.2 10*3/uL (ref 0.1–1.0)
Monocytes Relative: 4 %
Neutro Abs: 1.9 10*3/uL (ref 1.7–7.7)
Neutrophils Relative %: 49 %
Platelet Count: 84 10*3/uL — ABNORMAL LOW (ref 150–400)
RBC: 3.97 MIL/uL — ABNORMAL LOW (ref 4.22–5.81)
RDW: 17.2 % — ABNORMAL HIGH (ref 11.5–15.5)
WBC Count: 3.9 10*3/uL — ABNORMAL LOW (ref 4.0–10.5)
nRBC: 0 % (ref 0.0–0.2)

## 2018-09-11 LAB — CMP (CANCER CENTER ONLY)
ALT: 20 U/L (ref 0–44)
AST: 24 U/L (ref 15–41)
Albumin: 3.6 g/dL (ref 3.5–5.0)
Alkaline Phosphatase: 102 U/L (ref 38–126)
Anion gap: 10 (ref 5–15)
BUN: 15 mg/dL (ref 6–20)
CO2: 32 mmol/L (ref 22–32)
Calcium: 9 mg/dL (ref 8.9–10.3)
Chloride: 98 mmol/L (ref 98–111)
Creatinine: 0.88 mg/dL (ref 0.61–1.24)
GFR, Est AFR Am: >60 mL/min (ref >60)
GFR, Estimated: >60 mL/min (ref >60)
Glucose, Bld: 112 mg/dL — ABNORMAL HIGH (ref 70–99)
Potassium: 4 mmol/L (ref 3.5–5.1)
Sodium: 140 mmol/L (ref 135–145)
Total Bilirubin: 0.9 mg/dL (ref 0.3–1.2)
Total Protein: 7.4 g/dL (ref 6.5–8.1)

## 2018-09-18 ENCOUNTER — Other Ambulatory Visit: Payer: Medicaid Other

## 2018-09-20 ENCOUNTER — Other Ambulatory Visit: Payer: Self-pay | Admitting: Cardiothoracic Surgery

## 2018-09-20 DIAGNOSIS — J90 Pleural effusion, not elsewhere classified: Secondary | ICD-10-CM

## 2018-09-21 ENCOUNTER — Ambulatory Visit (HOSPITAL_COMMUNITY): Admission: RE | Admit: 2018-09-21 | Payer: No Typology Code available for payment source | Source: Ambulatory Visit

## 2018-09-25 ENCOUNTER — Inpatient Hospital Stay: Payer: No Typology Code available for payment source | Attending: Internal Medicine | Admitting: Internal Medicine

## 2018-09-25 ENCOUNTER — Inpatient Hospital Stay: Payer: No Typology Code available for payment source

## 2018-09-25 ENCOUNTER — Other Ambulatory Visit: Payer: Self-pay

## 2018-09-26 ENCOUNTER — Ambulatory Visit: Payer: No Typology Code available for payment source | Admitting: Cardiothoracic Surgery

## 2018-10-02 ENCOUNTER — Inpatient Hospital Stay: Payer: No Typology Code available for payment source

## 2018-10-09 ENCOUNTER — Inpatient Hospital Stay: Payer: No Typology Code available for payment source

## 2018-10-16 ENCOUNTER — Inpatient Hospital Stay: Payer: No Typology Code available for payment source | Attending: Internal Medicine

## 2018-10-16 ENCOUNTER — Encounter: Payer: Self-pay | Admitting: *Deleted

## 2018-10-16 ENCOUNTER — Inpatient Hospital Stay (HOSPITAL_BASED_OUTPATIENT_CLINIC_OR_DEPARTMENT_OTHER): Payer: No Typology Code available for payment source | Admitting: Medical

## 2018-10-16 ENCOUNTER — Inpatient Hospital Stay: Payer: No Typology Code available for payment source

## 2018-10-16 ENCOUNTER — Telehealth: Payer: Self-pay | Admitting: Internal Medicine

## 2018-10-16 ENCOUNTER — Encounter: Payer: Self-pay | Admitting: Internal Medicine

## 2018-10-16 ENCOUNTER — Other Ambulatory Visit: Payer: Self-pay

## 2018-10-16 ENCOUNTER — Inpatient Hospital Stay (HOSPITAL_BASED_OUTPATIENT_CLINIC_OR_DEPARTMENT_OTHER): Payer: No Typology Code available for payment source | Admitting: Internal Medicine

## 2018-10-16 ENCOUNTER — Other Ambulatory Visit: Payer: Self-pay | Admitting: Medical Oncology

## 2018-10-16 VITALS — BP 141/94 | HR 77 | Resp 18

## 2018-10-16 VITALS — BP 150/101 | HR 81 | Temp 97.8°F | Resp 20 | Ht 72.0 in | Wt 217.2 lb

## 2018-10-16 DIAGNOSIS — Z79899 Other long term (current) drug therapy: Secondary | ICD-10-CM | POA: Diagnosis not present

## 2018-10-16 DIAGNOSIS — E876 Hypokalemia: Secondary | ICD-10-CM | POA: Diagnosis not present

## 2018-10-16 DIAGNOSIS — C3491 Malignant neoplasm of unspecified part of right bronchus or lung: Secondary | ICD-10-CM

## 2018-10-16 DIAGNOSIS — C3411 Malignant neoplasm of upper lobe, right bronchus or lung: Secondary | ICD-10-CM | POA: Insufficient documentation

## 2018-10-16 DIAGNOSIS — Z5111 Encounter for antineoplastic chemotherapy: Secondary | ICD-10-CM | POA: Insufficient documentation

## 2018-10-16 LAB — CBC WITH DIFFERENTIAL (CANCER CENTER ONLY)
Abs Immature Granulocytes: 0.02 10*3/uL (ref 0.00–0.07)
Basophils Absolute: 0.1 10*3/uL (ref 0.0–0.1)
Basophils Relative: 1 %
Eosinophils Absolute: 1 10*3/uL — ABNORMAL HIGH (ref 0.0–0.5)
Eosinophils Relative: 12 %
HCT: 35.1 % — ABNORMAL LOW (ref 39.0–52.0)
Hemoglobin: 12.6 g/dL — ABNORMAL LOW (ref 13.0–17.0)
Immature Granulocytes: 0 %
Lymphocytes Relative: 19 %
Lymphs Abs: 1.5 10*3/uL (ref 0.7–4.0)
MCH: 34.7 pg — ABNORMAL HIGH (ref 26.0–34.0)
MCHC: 35.9 g/dL (ref 30.0–36.0)
MCV: 96.7 fL (ref 80.0–100.0)
Monocytes Absolute: 0.8 10*3/uL (ref 0.1–1.0)
Monocytes Relative: 10 %
Neutro Abs: 4.6 10*3/uL (ref 1.7–7.7)
Neutrophils Relative %: 58 %
Platelet Count: 200 10*3/uL (ref 150–400)
RBC: 3.63 MIL/uL — ABNORMAL LOW (ref 4.22–5.81)
RDW: 19.2 % — ABNORMAL HIGH (ref 11.5–15.5)
WBC Count: 8 10*3/uL (ref 4.0–10.5)
nRBC: 0 % (ref 0.0–0.2)

## 2018-10-16 LAB — CMP (CANCER CENTER ONLY)
ALT: 47 U/L — ABNORMAL HIGH (ref 0–44)
AST: 60 U/L — ABNORMAL HIGH (ref 15–41)
Albumin: 3.3 g/dL — ABNORMAL LOW (ref 3.5–5.0)
Alkaline Phosphatase: 105 U/L (ref 38–126)
Anion gap: 12 (ref 5–15)
BUN: 6 mg/dL (ref 6–20)
CO2: 28 mmol/L (ref 22–32)
Calcium: 7.9 mg/dL — ABNORMAL LOW (ref 8.9–10.3)
Chloride: 101 mmol/L (ref 98–111)
Creatinine: 0.99 mg/dL (ref 0.61–1.24)
GFR, Est AFR Am: >60 mL/min (ref >60)
GFR, Estimated: >60 mL/min (ref >60)
Glucose, Bld: 121 mg/dL — ABNORMAL HIGH (ref 70–99)
Potassium: 2.6 mmol/L — CL (ref 3.5–5.1)
Sodium: 141 mmol/L (ref 135–145)
Total Bilirubin: 0.9 mg/dL (ref 0.3–1.2)
Total Protein: 6.7 g/dL (ref 6.5–8.1)

## 2018-10-16 LAB — TSH: TSH: 2.044 u[IU]/mL (ref 0.320–4.118)

## 2018-10-16 MED ORDER — PALONOSETRON HCL INJECTION 0.25 MG/5ML
0.2500 mg | Freq: Once | INTRAVENOUS | Status: AC
  Administered 2018-10-16: 0.25 mg via INTRAVENOUS

## 2018-10-16 MED ORDER — POTASSIUM CHLORIDE CRYS ER 20 MEQ PO TBCR: 20.0000 meq | EXTENDED_RELEASE_TABLET | Freq: Every day | ORAL | 0 refills | Status: DC

## 2018-10-16 MED ORDER — SODIUM CHLORIDE 0.9 % IV SOLN
Freq: Once | INTRAVENOUS | Status: AC
  Administered 2018-10-16: 13:00:00 via INTRAVENOUS
  Filled 2018-10-16: qty 20

## 2018-10-16 MED ORDER — PALONOSETRON HCL INJECTION 0.25 MG/5ML
INTRAVENOUS | Status: AC
  Filled 2018-10-16: qty 5

## 2018-10-16 MED ORDER — SODIUM CHLORIDE 0.9 % IV SOLN: 200.0000 mg | Freq: Once | INTRAVENOUS | Status: DC

## 2018-10-16 MED ORDER — SODIUM CHLORIDE 0.9 % IV SOLN: 600.0000 mg | Freq: Once | INTRAVENOUS | Status: DC

## 2018-10-16 MED ORDER — SODIUM CHLORIDE 0.9 % IV SOLN
Freq: Once | INTRAVENOUS | Status: AC
  Administered 2018-10-16: 12:00:00 via INTRAVENOUS
  Filled 2018-10-16: qty 5

## 2018-10-16 MED ORDER — SODIUM CHLORIDE 0.9 % IV SOLN
380.0000 mg/m2 | Freq: Once | INTRAVENOUS | Status: AC
  Administered 2018-10-16: 900 mg via INTRAVENOUS
  Filled 2018-10-16: qty 20

## 2018-10-16 MED ORDER — SODIUM CHLORIDE 0.9 % IV SOLN
Freq: Once | INTRAVENOUS | Status: AC
  Administered 2018-10-16: 12:00:00 via INTRAVENOUS
  Filled 2018-10-16: qty 250

## 2018-10-16 MED ORDER — SODIUM CHLORIDE 0.9 % IV SOLN
542.0000 mg | Freq: Once | INTRAVENOUS | Status: AC
  Administered 2018-10-16: 540 mg via INTRAVENOUS
  Filled 2018-10-16: qty 54

## 2018-10-16 MED FILL — POTASSIUM CHLORIDE CRYS ER: 20 | 7 days supply | Qty: 7 | Fill #0

## 2018-10-16 NOTE — Progress Notes (Addendum)
Per Dr Julien Nordmann it is okay to treat pt today with carboplatin,alimta  and today's BP.

## 2018-10-16 NOTE — Progress Notes (Signed)
Oncology Nurse Navigator Documentation  Oncology Nurse Navigator Flowsheets 10/16/2018  Diagnosis Status -  Planned Course of Treatment -  Phase of Treatment -  Navigator Follow Up Reason: -  Navigator Location [facility]  Referral Date to RadOnc/MedOnc -  Navigator Encounter Type Clinic/MDC  Telephone -  Abnormal Finding Date -  Confirmed Diagnosis Date -  Multidisiplinary Clinic Date -  Treatment Initiated Date -  Patient Visit Type MedOnc  Treatment Phase Treatment  Barriers/Navigation Needs Coordination of Care;Education/patient did not get his follow scan before his appt today.  I called radiology and got this scheduled.  I updated patient on appt 10/18/2018 arrive at 2:15 and liquids only 4 hours before.  He verbalized understanding of appt time and place.   Education Other  Interventions Coordination of Care;Education  Coordination of Care Appts  Education Method Verbal;Written  Acuity Level 3  Acuity Level 2 -  Time Spent with Patient 28

## 2018-10-16 NOTE — Patient Instructions (Signed)
[facility] Discharge Instructions for Patients Receiving Chemotherapy  Today you received the following chemotherapy agents Alimta, Carboplatin and other agent: Potassium  To help prevent nausea and vomiting after your treatment, we encourage you to take your nausea medication as directed by your MD.   If you develop nausea and vomiting that is not controlled by your nausea medication, call the clinic.   BELOW ARE SYMPTOMS THAT SHOULD BE REPORTED IMMEDIATELY:  *FEVER GREATER THAN 100.5 F  *CHILLS WITH OR WITHOUT FEVER  NAUSEA AND VOMITING THAT IS NOT CONTROLLED WITH YOUR NAUSEA MEDICATION  *UNUSUAL SHORTNESS OF BREATH  *UNUSUAL BRUISING OR BLEEDING  TENDERNESS IN MOUTH AND THROAT WITH OR WITHOUT PRESENCE OF ULCERS  *URINARY PROBLEMS  *BOWEL PROBLEMS  UNUSUAL RASH Items with * indicate a potential emergency and should be followed up as soon as possible.  Feel free to call the clinic should you have any questions or concerns. The clinic phone number is [phone].  Please show the [facility] at check-in to the Emergency Department and triage nurse.  Coronavirus (COVID-19) Are you at risk?  Are you at risk for the Coronavirus (COVID-19)?  To be considered HIGH RISK for Coronavirus (COVID-19), you have to meet the following criteria:   Traveled to [place], [place], [place], [place] or [place]; or in the [place] to [place], [place], [place], or [place]; and have fever, cough, and shortness of breath within the last 2 weeks of travel OR  Been in close contact with a person diagnosed with COVID-19 within the last 2 weeks and have fever, cough, and shortness of breath  IF YOU DO NOT MEET THESE CRITERIA, YOU ARE CONSIDERED LOW RISK FOR COVID-19.  What to do if you are HIGH RISK for COVID-19?   If you are having a medical emergency, call 911.  Seek medical care right away. Before you go to a doctors office, urgent care or emergency  department, call ahead and tell them about your recent travel, contact with someone diagnosed with COVID-19, and your symptoms. You should receive instructions from your physicians office regarding next steps of care.   When you arrive at healthcare provider, tell the healthcare staff immediately you have returned from visiting [place], [place], [place], [place] or [place]; or traveled in the [place] to [place], [place], [place], or [place]; in the last two weeks or you have been in close contact with a person diagnosed with COVID-19 in the last 2 weeks.    Tell the health care staff about your symptoms: fever, cough and shortness of breath.  After you have been seen by a medical provider, you will be either: o Tested for (COVID-19) and discharged home on quarantine except to seek medical care if symptoms worsen, and asked to  - Stay home and avoid contact with others until you get your results (4-5 days)  - Avoid travel on public transportation if possible (such as bus, train, or airplane) or o Sent to the Emergency Department by EMS for evaluation, COVID-19 testing, and possible admission depending on your condition and test results.  What to do if you are LOW RISK for COVID-19?  Reduce your risk of any infection by using the same precautions used for avoiding the common cold or flu:   Wash your hands often with soap and warm water for at least 20 seconds.  If soap and water are not readily available, use an alcohol-based hand sanitizer with at least  60% alcohol.   If coughing or sneezing, cover your mouth and nose by coughing or sneezing into the elbow areas of your shirt or coat, into a tissue or into your sleeve (not your hands).  Avoid shaking hands with others and consider head nods or verbal greetings only.  Avoid touching your eyes, nose, or mouth with unwashed hands.   Avoid close contact with people who are sick.  Avoid places or events with large numbers of people  in one location, like concerts or sporting events.  Carefully consider travel plans you have or are making.  If you are planning any travel outside or inside the [place], visit the [facility] webpage for the latest health notices.  If you have some symptoms but not all symptoms, continue to monitor at home and seek medical attention if your symptoms worsen.  If you are having a medical emergency, call 911.   [facility] / e-Visit: [email or web address]         [facility]: [phone]  [facility]: [phone]                   [facility]: [phone]  Hypokalemia Hypokalemia means that the amount of potassium in the blood is lower than normal.Potassium is a chemical that helps regulate the amount of fluid in the body (electrolyte). It also stimulates muscle tightening (contraction) and helps nerves work properly.Normally, most of the bodys potassium is inside of cells, and only a very small amount is in the blood. Because the amount in the blood is so small, minor changes to potassium levels in the blood can be life-threatening. What are the causes? This condition may be caused by:  Antibiotic medicine.  Diarrhea or vomiting. Taking too much of a medicine that helps you have a bowel movement (laxative) can cause diarrhea and lead to hypokalemia.  Chronic kidney disease (CKD).  Medicines that help the body get rid of excess fluid (diuretics).  Eating disorders, such as bulimia.  Low magnesium levels in the body.  Sweating a lot. What are the signs or symptoms? Symptoms of this condition include:  Weakness.  Constipation.  Fatigue.  Muscle cramps.  Mental confusion.  Skipped heartbeats or irregular heartbeat (palpitations).  Tingling or numbness. How is this diagnosed? This condition is diagnosed with a blood  test. How is this treated? Hypokalemia can be treated by taking potassium supplements by mouth or adjusting the medicines that you take. Treatment may also include eating more foods that contain a lot of potassium. If your potassium level is very low, you may need to get potassium through an IV tube in one of your veins and be monitored in the hospital. Follow these instructions at home:   Take over-the-counter and prescription medicines only as told by your health care provider. This includes vitamins and supplements.  Eat a healthy diet. A healthy diet includes fresh fruits and vegetables, whole grains, healthy fats, and lean proteins.  If instructed, eat more foods that contain a lot of potassium, such as: ? Nuts, such as peanuts and pistachios. ? Seeds, such as sunflower seeds and pumpkin seeds. ? Peas, lentils, and lima beans. ? Whole grain and bran cereals and breads. ? Fresh fruits and vegetables, such as apricots, avocado, bananas, cantaloupe, kiwi, oranges, tomatoes, asparagus, and potatoes. ? Orange juice. ? Tomato juice. ? Red meats. ? Yogurt.  Keep all follow-up visits as told by your health  care provider. This is important. Contact a health care provider if:  You have weakness that gets worse.  You feel your heart pounding or racing.  You vomit.  You have diarrhea.  You have diabetes (diabetes mellitus) and you have trouble keeping your blood sugar (glucose) in your target range. Get help right away if:  You have chest pain.  You have shortness of breath.  You have vomiting or diarrhea that lasts for more than 2 days.  You faint. This information is not intended to replace advice given to you by your health care provider. Make sure you discuss any questions you have with your health care provider. Document Released: 02/28/2005 Document Revised: 10/17/2015 Document Reviewed: 10/17/2015 Elsevier Interactive Patient Education  2019 [employer].

## 2018-10-16 NOTE — Progress Notes (Signed)
Daniel Bryant was seen for today as a secondary screening.  He had been initially screened by in front office staff.  He reported that he had had diarrhea and other GI symptoms including nausea.  On speaking with him further he states that he had diarrhea following his last chemotherapy but has not had any diarrhea for the past week.  He also had some nausea without vomiting.  He has had no nausea for 3 days.  He reports occasional sweats but no fevers or chills.  He has a history of pleural effusions.  He reports today that he is feeling better and breathing better than he has for a while.  He was cleared to proceed with his appointment for labs and visit with Dr. Julien Nordmann.  [name], MHS, PA-C Physician Assistant

## 2018-10-16 NOTE — Progress Notes (Signed)
Pt is no longer getting Keytruda.

## 2018-10-16 NOTE — Telephone Encounter (Signed)
Scheduled appt per 8/04 los - pt to get an updated schedule next visit.

## 2018-10-16 NOTE — Progress Notes (Signed)
Daniel Bryant [phone]   [phone] Daniel Bryant [place] [phone]  DIAGNOSIS: Stage IV (T1b, N2, M1) non-small cell lung cancer, adenocarcinoma diagnosed in January 2020.  He presented with right upper lobe lung nodule in addition to mediastinal lymphadenopathy and malignant right pleural effusion.  PRIOR THERAPY: None  CURRENT THERAPY: Systemic chemotherapy with carboplatin for AUC of 5, Alimta 500 mg/M2 and Keytruda 200 mg IV every 3 weeks status post 2 cycles.  First dose was given on July 04, 2018.  [name] was discontinued starting from cycle #2 because of significant skin rash as well as diarrhea.  His dose of carboplatin was also reduced to AUC of 5 and Alimta to 400 mg/M2 starting from cycle #3 secondary to intolerance.  INTERVAL HISTORY: Daniel Bryant 58 y.o. male returns to the clinic today for follow-up visit.  The patient is feeling fine today with no concerning complaints except for mild fatigue.  He denied having any chest pain, shortness of breath, cough or hemoptysis.  He continues to have occasional nausea even in the absence of chemotherapy.  He denied having any fever or chills.  He has no diarrhea or constipation.  He denied having any skin rash.  He was supposed to have repeat CT scan of the chest before this visit but unfortunately it scheduled to be done on 10/18/2018.  The patient is here today for evaluation before starting cycle #4 of his treatment.  MEDICAL HISTORY: Past Medical History:  Diagnosis Date  . Asthma   . Cancer ([facility])   . COPD (chronic obstructive pulmonary disease) ([facility])   . GERD (gastroesophageal reflux disease)   . High cholesterol   . History of petit-mal seizures   . Hyperlipidemia   . Hypertension   . Memory impairment   . OSA (obstructive sleep apnea)    Does not tolerate CPAP  . Pacemaker   . PTSD (post-traumatic stress  disorder)   . Sick sinus syndrome ([facility])     ALLERGIES:  is allergic to other.  MEDICATIONS:  Current Outpatient Medications  Medication Sig Dispense Refill  . albuterol (PROVENTIL) (2.5 MG/3ML) 0.083% nebulizer solution Take 3 mLs (2.5 mg total) by nebulization 2 (two) times daily as needed for wheezing or shortness of breath. 75 mL 0  . aspirin EC 81 MG EC tablet Take 1 tablet (81 mg total) by mouth daily.    Marland Kitchen atorvastatin (LIPITOR) 40 MG tablet Take 40 mg by mouth at bedtime.    . Cholecalciferol (VITAMIN D3) 25 MCG (1000 UT) CAPS Take 1,000 Units by mouth daily.    . diphenhydrAMINE (BENADRYL) 25 MG tablet Take 1 tablet (25 mg total) by mouth every 6 (six) hours as needed for itching or allergies. 20 tablet 0  . donepezil (ARICEPT) 5 MG tablet Take 5 mg by mouth at bedtime.    Marland Kitchen escitalopram (LEXAPRO) 20 MG tablet Take 20 mg by mouth daily.     . folic acid (FOLVITE) 1 MG tablet Take 1 tablet (1 mg total) by mouth daily. 30 tablet 4  . lidocaine (XYLOCAINE) 2 % solution Use as directed 5 mLs in the mouth or throat every 3 (three) hours as needed for mouth pain. Gargle and spit 200 mL 1  . pantoprazole (PROTONIX) 40 MG tablet Take 1 tablet (40 mg total) by mouth daily. (Patient taking differently: Take 40 mg by mouth daily as needed (acid reflux). )  30 tablet 0  . prochlorperazine (COMPAZINE) 10 MG tablet Take 1 tablet (10 mg total) by mouth every 6 (six) hours as needed for nausea or vomiting. 30 tablet 0  . potassium chloride SA (K-DUR) 20 MEQ tablet Take 2 tablets (40 mEq total) by mouth daily. (Patient not taking: Reported on 10/16/2018) 20 tablet 0   No current facility-administered medications for this visit.     SURGICAL HISTORY:  Past Surgical History:  Procedure Laterality Date  . CARDIAC SURGERY    . CHEST TUBE INSERTION Right 05/16/2018   Procedure: INSERTION PLEURAL DRAINAGE CATHETER;  Surgeon: Daniel Poot, MD;  Location: [facility];  Service: Thoracic;  Laterality: Right;   . CHEST TUBE INSERTION Right 05/16/2018   Procedure: Chest Tube Insertion;  Surgeon: Daniel Poot, MD;  Location: [facility];  Service: Thoracic;  Laterality: Right;  . PACEMAKER INSERTION    . REMOVAL OF PLEURAL DRAINAGE CATHETER Right 08/30/2018   Procedure: REMOVAL OF PLEURAL DRAINAGE CATHETER;  Surgeon: Daniel Poot, MD;  Location: [facility];  Service: Thoracic;  Laterality: Right;    REVIEW OF SYSTEMS:  A comprehensive review of systems was negative except for: Constitutional: positive for fatigue Gastrointestinal: positive for nausea   PHYSICAL EXAMINATION: General appearance: alert, cooperative and no distress Head: Normocephalic, without obvious abnormality, atraumatic Neck: no adenopathy, no JVD, supple, symmetrical, trachea midline and thyroid not enlarged, symmetric, no tenderness/mass/nodules Lymph nodes: Cervical, supraclavicular, and axillary nodes normal. Resp: clear to auscultation bilaterally Back: symmetric, no curvature. ROM normal. No CVA tenderness. Cardio: regular rate and rhythm, S1, S2 normal, no murmur, click, rub or gallop GI: soft, non-tender; bowel sounds normal; no masses,  no organomegaly Extremities: extremities normal, atraumatic, no cyanosis or edema    ECOG PERFORMANCE STATUS: 1 - Symptomatic but completely ambulatory  Blood pressure (!) 157/104, pulse 88, temperature 97.8 F (36.6 C), temperature source Oral, resp. rate 20, height 6' (1.829 m), weight 217 lb 3.2 oz (98.5 kg), SpO2 99 %.  LABORATORY DATA: Lab Results  Component Value Date   WBC 8.0 10/16/2018   HGB 12.6 (L) 10/16/2018   HCT 35.1 (L) 10/16/2018   MCV 96.7 10/16/2018   PLT 200 10/16/2018      Chemistry      Component Value Date/Time   NA 140 09/11/2018 1401   K 4.0 09/11/2018 1401   CL 98 09/11/2018 1401   CO2 32 09/11/2018 1401   BUN 15 09/11/2018 1401   CREATININE 0.88 09/11/2018 1401      Component Value Date/Time   CALCIUM 9.0 09/11/2018 1401   ALKPHOS 102 09/11/2018  1401   AST 24 09/11/2018 1401   ALT 20 09/11/2018 1401   BILITOT 0.9 09/11/2018 1401       RADIOGRAPHIC STUDIES: No results found.  ASSESSMENT AND PLAN: This is a very pleasant 58 years old white male with metastatic non-small cell lung cancer, adenocarcinoma currently undergoing systemic chemotherapy with carboplatin, Alimta and Keytruda status post 3 cycles. [name] was discontinued secondary to significant skin rash. I also reduced his dose of carboplatin to AUC of 4 and Alimta 400 mg/M2 starting from cycle #3 secondary to intolerance and toxicity. The patient will proceed with cycle #4 today as planned. I will see him back for follow-up visit in 3 weeks for evaluation and discussion of his scan results before the next cycle of his treatment. For hypertension he was advised to take his blood pressure medication as prescribed. For the hypokalemia, will arrange for the  patient to receive 40 mEq of potassium chloride IV in the clinic and he will continue on 20 mEq p.o. daily for 7 more days. The patient was advised to call immediately if he has any concerning symptoms in the interval. The patient voices understanding of current disease status and treatment options and is in agreement with the current care plan.  All questions were answered. The patient knows to call the clinic with any problems, questions or concerns. We can certainly see the patient much sooner if necessary.  Disclaimer: This note was dictated with voice recognition software. Similar sounding words can inadvertently be transcribed and may not be corrected upon review.

## 2018-10-17 ENCOUNTER — Ambulatory Visit: Payer: No Typology Code available for payment source | Admitting: Cardiothoracic Surgery

## 2018-10-18 ENCOUNTER — Ambulatory Visit (HOSPITAL_COMMUNITY): Admission: RE | Admit: 2018-10-18 | Payer: No Typology Code available for payment source | Source: Ambulatory Visit

## 2018-10-19 ENCOUNTER — Other Ambulatory Visit (HOSPITAL_COMMUNITY): Payer: No Typology Code available for payment source

## 2018-10-23 ENCOUNTER — Other Ambulatory Visit: Payer: Self-pay

## 2018-10-23 ENCOUNTER — Inpatient Hospital Stay: Payer: No Typology Code available for payment source

## 2018-10-23 ENCOUNTER — Encounter (HOSPITAL_COMMUNITY): Payer: Self-pay

## 2018-10-23 ENCOUNTER — Ambulatory Visit (HOSPITAL_COMMUNITY)
Admission: RE | Admit: 2018-10-23 | Discharge: 2018-10-23 | Disposition: A | Discharge status: Home / Self Care | Payer: No Typology Code available for payment source | Source: Ambulatory Visit | Attending: Internal Medicine | Admitting: Internal Medicine

## 2018-10-23 DIAGNOSIS — C3490 Malignant neoplasm of unspecified part of unspecified bronchus or lung: Secondary | ICD-10-CM | POA: Insufficient documentation

## 2018-10-23 DIAGNOSIS — C3491 Malignant neoplasm of unspecified part of right bronchus or lung: Secondary | ICD-10-CM

## 2018-10-23 LAB — CBC WITH DIFFERENTIAL (CANCER CENTER ONLY)
Abs Immature Granulocytes: 0.02 10*3/uL (ref 0.00–0.07)
Basophils Absolute: 0 10*3/uL (ref 0.0–0.1)
Basophils Relative: 1 %
Eosinophils Absolute: 0.5 10*3/uL (ref 0.0–0.5)
Eosinophils Relative: 12 %
HCT: 36.3 % — ABNORMAL LOW (ref 39.0–52.0)
Hemoglobin: 13.1 g/dL (ref 13.0–17.0)
Immature Granulocytes: 1 %
Lymphocytes Relative: 31 %
Lymphs Abs: 1.2 10*3/uL (ref 0.7–4.0)
MCH: 35.6 pg — ABNORMAL HIGH (ref 26.0–34.0)
MCHC: 36.1 g/dL — ABNORMAL HIGH (ref 30.0–36.0)
MCV: 98.6 fL (ref 80.0–100.0)
Monocytes Absolute: 0.1 10*3/uL (ref 0.1–1.0)
Monocytes Relative: 3 %
Neutro Abs: 2 10*3/uL (ref 1.7–7.7)
Neutrophils Relative %: 52 %
Platelet Count: 129 10*3/uL — ABNORMAL LOW (ref 150–400)
RBC: 3.68 MIL/uL — ABNORMAL LOW (ref 4.22–5.81)
RDW: 15.9 % — ABNORMAL HIGH (ref 11.5–15.5)
WBC Count: 3.8 10*3/uL — ABNORMAL LOW (ref 4.0–10.5)
nRBC: 0 % (ref 0.0–0.2)

## 2018-10-23 LAB — CMP (CANCER CENTER ONLY)
ALT: 27 U/L (ref 0–44)
AST: 27 U/L (ref 15–41)
Albumin: 3.6 g/dL (ref 3.5–5.0)
Alkaline Phosphatase: 99 U/L (ref 38–126)
Anion gap: 13 (ref 5–15)
BUN: 15 mg/dL (ref 6–20)
CO2: 31 mmol/L (ref 22–32)
Calcium: 8.9 mg/dL (ref 8.9–10.3)
Chloride: 95 mmol/L — ABNORMAL LOW (ref 98–111)
Creatinine: 1 mg/dL (ref 0.61–1.24)
GFR, Est AFR Am: >60 mL/min (ref >60)
GFR, Estimated: >60 mL/min (ref >60)
Glucose, Bld: 107 mg/dL — ABNORMAL HIGH (ref 70–99)
Potassium: 3.2 mmol/L — ABNORMAL LOW (ref 3.5–5.1)
Sodium: 139 mmol/L (ref 135–145)
Total Bilirubin: 1.1 mg/dL (ref 0.3–1.2)
Total Protein: 6.9 g/dL (ref 6.5–8.1)

## 2018-10-23 MED ORDER — SODIUM CHLORIDE (PF) 0.9 % IJ SOLN
INTRAMUSCULAR | Status: AC
  Filled 2018-10-23: qty 50

## 2018-10-23 MED ORDER — IOHEXOL 300 MG/ML  SOLN
75.0000 mL | Freq: Once | INTRAMUSCULAR | Status: AC | PRN
  Administered 2018-10-23: 75 mL via INTRAVENOUS

## 2018-10-30 ENCOUNTER — Inpatient Hospital Stay: Payer: No Typology Code available for payment source

## 2018-11-05 ENCOUNTER — Emergency Department (HOSPITAL_COMMUNITY): Payer: No Typology Code available for payment source

## 2018-11-05 ENCOUNTER — Inpatient Hospital Stay (HOSPITAL_COMMUNITY)
Admission: EM | Admit: 2018-11-05 | Discharge: 2018-11-12 | DRG: 392 | Disposition: A | Discharge status: Home / Self Care | Payer: No Typology Code available for payment source | Attending: Internal Medicine | Admitting: Internal Medicine

## 2018-11-05 ENCOUNTER — Other Ambulatory Visit: Payer: Self-pay

## 2018-11-05 ENCOUNTER — Encounter (HOSPITAL_COMMUNITY): Payer: Self-pay

## 2018-11-05 DIAGNOSIS — F4310 Post-traumatic stress disorder, unspecified: Secondary | ICD-10-CM | POA: Diagnosis present

## 2018-11-05 DIAGNOSIS — E785 Hyperlipidemia, unspecified: Secondary | ICD-10-CM | POA: Diagnosis present

## 2018-11-05 DIAGNOSIS — A084 Viral intestinal infection, unspecified: Principal | ICD-10-CM | POA: Diagnosis present

## 2018-11-05 DIAGNOSIS — E860 Dehydration: Secondary | ICD-10-CM | POA: Diagnosis present

## 2018-11-05 DIAGNOSIS — J449 Chronic obstructive pulmonary disease, unspecified: Secondary | ICD-10-CM | POA: Diagnosis present

## 2018-11-05 DIAGNOSIS — E876 Hypokalemia: Secondary | ICD-10-CM | POA: Diagnosis present

## 2018-11-05 DIAGNOSIS — I10 Essential (primary) hypertension: Secondary | ICD-10-CM | POA: Diagnosis present

## 2018-11-05 DIAGNOSIS — K219 Gastro-esophageal reflux disease without esophagitis: Secondary | ICD-10-CM | POA: Diagnosis present

## 2018-11-05 DIAGNOSIS — Z950 Presence of cardiac pacemaker: Secondary | ICD-10-CM

## 2018-11-05 DIAGNOSIS — I951 Orthostatic hypotension: Secondary | ICD-10-CM | POA: Diagnosis present

## 2018-11-05 DIAGNOSIS — Z20828 Contact with and (suspected) exposure to other viral communicable diseases: Secondary | ICD-10-CM | POA: Diagnosis present

## 2018-11-05 DIAGNOSIS — R197 Diarrhea, unspecified: Secondary | ICD-10-CM | POA: Diagnosis present

## 2018-11-05 DIAGNOSIS — G4733 Obstructive sleep apnea (adult) (pediatric): Secondary | ICD-10-CM | POA: Diagnosis present

## 2018-11-05 DIAGNOSIS — F17220 Nicotine dependence, chewing tobacco, uncomplicated: Secondary | ICD-10-CM | POA: Diagnosis present

## 2018-11-05 DIAGNOSIS — C3491 Malignant neoplasm of unspecified part of right bronchus or lung: Secondary | ICD-10-CM | POA: Diagnosis present

## 2018-11-05 DIAGNOSIS — F329 Major depressive disorder, single episode, unspecified: Secondary | ICD-10-CM | POA: Diagnosis present

## 2018-11-05 DIAGNOSIS — Z809 Family history of malignant neoplasm, unspecified: Secondary | ICD-10-CM

## 2018-11-05 DIAGNOSIS — Z79899 Other long term (current) drug therapy: Secondary | ICD-10-CM

## 2018-11-05 DIAGNOSIS — Z7982 Long term (current) use of aspirin: Secondary | ICD-10-CM

## 2018-11-05 DIAGNOSIS — Z9221 Personal history of antineoplastic chemotherapy: Secondary | ICD-10-CM

## 2018-11-05 DIAGNOSIS — N179 Acute kidney failure, unspecified: Secondary | ICD-10-CM | POA: Diagnosis present

## 2018-11-05 DIAGNOSIS — E7800 Pure hypercholesterolemia, unspecified: Secondary | ICD-10-CM | POA: Diagnosis present

## 2018-11-05 LAB — COMPREHENSIVE METABOLIC PANEL
ALT: 93 U/L — ABNORMAL HIGH (ref 0–44)
AST: 56 U/L — ABNORMAL HIGH (ref 15–41)
Albumin: 2.9 g/dL — ABNORMAL LOW (ref 3.5–5.0)
Alkaline Phosphatase: 77 U/L (ref 38–126)
Anion gap: 15 (ref 5–15)
BUN: 18 mg/dL (ref 6–20)
CO2: 30 mmol/L (ref 22–32)
Calcium: 7.1 mg/dL — ABNORMAL LOW (ref 8.9–10.3)
Chloride: 89 mmol/L — ABNORMAL LOW (ref 98–111)
Creatinine, Ser: 1.53 mg/dL — ABNORMAL HIGH (ref 0.61–1.24)
GFR calc Af Amer: 56 mL/min — ABNORMAL LOW (ref >60)
GFR calc non Af Amer: 49 mL/min — ABNORMAL LOW (ref >60)
Glucose, Bld: 111 mg/dL — ABNORMAL HIGH (ref 70–99)
Potassium: <2 mmol/L — CL (ref 3.5–5.1)
Sodium: 134 mmol/L — ABNORMAL LOW (ref 135–145)
Total Bilirubin: 1 mg/dL (ref 0.3–1.2)
Total Protein: 5.7 g/dL — ABNORMAL LOW (ref 6.5–8.1)

## 2018-11-05 LAB — CBC WITH DIFFERENTIAL/PLATELET
Abs Immature Granulocytes: 0.13 10*3/uL — ABNORMAL HIGH (ref 0.00–0.07)
Basophils Absolute: 0 10*3/uL (ref 0.0–0.1)
Basophils Relative: 0 %
Eosinophils Absolute: 0 10*3/uL (ref 0.0–0.5)
Eosinophils Relative: 1 %
HCT: 28.6 % — ABNORMAL LOW (ref 39.0–52.0)
Hemoglobin: 10.8 g/dL — ABNORMAL LOW (ref 13.0–17.0)
Immature Granulocytes: 2 %
Lymphocytes Relative: 18 %
Lymphs Abs: 1.1 10*3/uL (ref 0.7–4.0)
MCH: 34.5 pg — ABNORMAL HIGH (ref 26.0–34.0)
MCHC: 37.8 g/dL — ABNORMAL HIGH (ref 30.0–36.0)
MCV: 91.4 fL (ref 80.0–100.0)
Monocytes Absolute: 0.6 10*3/uL (ref 0.1–1.0)
Monocytes Relative: 9 %
Neutro Abs: 4.5 10*3/uL (ref 1.7–7.7)
Neutrophils Relative %: 70 %
Platelets: 225 10*3/uL (ref 150–400)
RBC: 3.13 MIL/uL — ABNORMAL LOW (ref 4.22–5.81)
RDW: 13.6 % (ref 11.5–15.5)
WBC: 6.3 10*3/uL (ref 4.0–10.5)
nRBC: 0 % (ref 0.0–0.2)

## 2018-11-05 LAB — ETHANOL: Alcohol, Ethyl (B): <10 mg/dL (ref <10)

## 2018-11-05 LAB — LIPASE, BLOOD: Lipase: 83 U/L — ABNORMAL HIGH (ref 11–51)

## 2018-11-05 LAB — TROPONIN I (HIGH SENSITIVITY): Troponin I (High Sensitivity): 25 ng/L — ABNORMAL HIGH (ref <18)

## 2018-11-05 LAB — MAGNESIUM: Magnesium: 1.1 mg/dL — ABNORMAL LOW (ref 1.7–2.4)

## 2018-11-05 MED ORDER — LIDOCAINE VISCOUS HCL 2 % MT SOLN: 5.0000 mL | OROMUCOSAL | Status: DC | PRN

## 2018-11-05 MED ORDER — POTASSIUM CHLORIDE 10 MEQ/100ML IV SOLN
10.0000 meq | INTRAVENOUS | Status: AC
  Administered 2018-11-06 (×6): 10 meq via INTRAVENOUS
  Filled 2018-11-05 (×4): qty 100

## 2018-11-05 MED ORDER — PANTOPRAZOLE SODIUM 40 MG PO TBEC
40.0000 mg | DELAYED_RELEASE_TABLET | Freq: Every day | ORAL | Status: DC
  Administered 2018-11-06 – 2018-11-10 (×5): 40 mg via ORAL
  Filled 2018-11-05 (×5): qty 1

## 2018-11-05 MED ORDER — MAGNESIUM SULFATE 2 GM/50ML IV SOLN
2.0000 g | Freq: Once | INTRAVENOUS | Status: AC
  Administered 2018-11-06: 2 g via INTRAVENOUS
  Filled 2018-11-05: qty 50

## 2018-11-05 MED ORDER — CALCIUM GLUCONATE-NACL 2-0.675 GM/100ML-% IV SOLN
2.0000 g | Freq: Once | INTRAVENOUS | Status: AC
  Administered 2018-11-06: 2000 mg via INTRAVENOUS
  Filled 2018-11-05: qty 100

## 2018-11-05 MED ORDER — SODIUM CHLORIDE 0.9 % IV BOLUS
1000.0000 mL | Freq: Once | INTRAVENOUS | Status: AC
  Administered 2018-11-06: 1000 mL via INTRAVENOUS

## 2018-11-05 MED ORDER — PROCHLORPERAZINE EDISYLATE 10 MG/2ML IJ SOLN
5.0000 mg | Freq: Four times a day (QID) | INTRAMUSCULAR | Status: DC | PRN
  Administered 2018-11-06 (×2): 5 mg via INTRAVENOUS
  Filled 2018-11-05 (×2): qty 2

## 2018-11-05 MED ORDER — IPRATROPIUM-ALBUTEROL 0.5-2.5 (3) MG/3ML IN SOLN: 3.0000 mL | Freq: Four times a day (QID) | RESPIRATORY_TRACT | Status: DC | PRN

## 2018-11-05 MED ORDER — DIPHENHYDRAMINE HCL 25 MG PO CAPS: 25.0000 mg | ORAL_CAPSULE | Freq: Four times a day (QID) | ORAL | Status: DC | PRN

## 2018-11-05 MED ORDER — LOPERAMIDE HCL 2 MG PO CAPS
2.0000 mg | ORAL_CAPSULE | ORAL | Status: DC | PRN
  Administered 2018-11-07 – 2018-11-08 (×5): 2 mg via ORAL
  Filled 2018-11-05 (×5): qty 1

## 2018-11-05 MED ORDER — SODIUM CHLORIDE 0.9 % IV BOLUS
500.0000 mL | Freq: Once | INTRAVENOUS | Status: AC
  Administered 2018-11-05: 500 mL via INTRAVENOUS

## 2018-11-05 MED ORDER — SODIUM CHLORIDE 0.9 % IV SOLN
INTRAVENOUS | Status: AC
  Administered 2018-11-06: 02:00:00 via INTRAVENOUS

## 2018-11-05 MED ORDER — ATORVASTATIN CALCIUM 40 MG PO TABS
40.0000 mg | ORAL_TABLET | Freq: Every day | ORAL | Status: DC
  Administered 2018-11-06 – 2018-11-12 (×7): 40 mg via ORAL
  Filled 2018-11-05 (×7): qty 1

## 2018-11-05 MED ORDER — POTASSIUM CHLORIDE CRYS ER 20 MEQ PO TBCR
40.0000 meq | EXTENDED_RELEASE_TABLET | Freq: Once | ORAL | Status: AC
  Administered 2018-11-06: 40 meq via ORAL
  Filled 2018-11-05: qty 2

## 2018-11-05 MED ORDER — ESCITALOPRAM OXALATE 10 MG PO TABS
20.0000 mg | ORAL_TABLET | Freq: Every day | ORAL | Status: DC
  Administered 2018-11-06 – 2018-11-12 (×7): 20 mg via ORAL
  Filled 2018-11-05 (×7): qty 2

## 2018-11-05 NOTE — H&P (Signed)
History and Physical    Daniel Bryant OYD:[id number] DOB: March 19, 1960 DOA: 11/05/2018  PCP: [facility] Patient coming from:   Chief Complaint: Generalized weakness, dizziness, vomiting, diarrhea  HPI: Daniel Bryant is a 58 y.o. male with medical history significant of stage IV non-small cell lung cancer diagnosed in January 2020 currently on systemic chemotherapy, asthma, COPD, GERD, hyperlipidemia, hypertension, OSA not on CPAP, sick sinus syndrome status post PPM, history of petit mal seizures currently not on antiepileptics presenting to the hospital via EMS for evaluation of generalized weakness, dizziness, vomiting, and diarrhea. Pt was orthostatic with EMS, and received a 575m NS bolus PTA.  Patient states for the past 2 days he is having continuous nausea, vomiting, and diarrhea.  Reports having nonbloody nonbilious emesis and diarrhea which is mostly light brown but sometimes "coffee ground" in appearance.  He is having mild bilateral lower quadrant abdominal discomfort.  He has not been able to tolerate any p.o. intake.  He has been feeling very lightheaded when he tries to get up or walk.  No headaches or focal weakness/numbness.  He is not sure if he has been having any fevers.  Denies any recent sick contacts.  No chest pain.  No other complaints.  ED Course: Hemodynamically stable on arrival.  No leukocytosis.  Hemoglobin 10.8, baseline in the 10-12 range.  Potassium <2.0, magnesium 1.1, chloride 89.  EKG with QT prolongation (QTc 518) and questionable U waves in lateral leads.  Corrected calcium 8.0.  Creatinine 1.5, baseline 0.8-0.9.  AST 56, ALT 93 (transaminases elevated 2 weeks ago as well).  Alk phos and T bili normal.  Lipase 83.  Blood ethanol level negative.  High-sensitivity troponin 25.  COVID-19 test pending.  Chest x-ray showing no active disease.  Review of Systems:  All systems reviewed and apart from history of presenting illness, are negative.  Past  Medical History:  Diagnosis Date  . Asthma   . Cancer ([facility]   . COPD (chronic obstructive pulmonary disease) ([facility]   . GERD (gastroesophageal reflux disease)   . High cholesterol   . History of petit-mal seizures   . Hyperlipidemia   . Hypertension   . Memory impairment   . OSA (obstructive sleep apnea)    Does not tolerate CPAP  . Pacemaker   . PTSD (post-traumatic stress disorder)   . Sick sinus syndrome ([facility]     Past Surgical History:  Procedure Laterality Date  . CARDIAC SURGERY    . CHEST TUBE INSERTION Right 05/16/2018   Procedure: INSERTION PLEURAL DRAINAGE CATHETER;  Surgeon: VIvin Poot MD;  [facility]  Service: Thoracic;  Laterality: Right;  . CHEST TUBE INSERTION Right 05/16/2018   Procedure: Chest Tube Insertion;  Surgeon: VIvin Poot MD;  [facility] [facility]  Service: Thoracic;  Laterality: Right;  . PACEMAKER INSERTION    . REMOVAL OF PLEURAL DRAINAGE CATHETER Right 08/30/2018   Procedure: REMOVAL OF PLEURAL DRAINAGE CATHETER;  Surgeon: VIvin Poot MD;  Location[place] [facility]  Service: Thoracic;  Laterality: Right;     reports that he has quit smoking. His smoking use included e-cigarettes. His smokeless tobacco use includes chew. He reports previous alcohol use. He reports that he does not use drugs.  Allergies  Allergen Reactions  . Other Other (See Comments)    Patient states "I had a scrotal procedure a couple of years ago, there was something they gave me to relax me and I started freaking out and seeing  things".  Unable to ascertain from pt or his records what medication was.    Family History  Problem Relation Age of Onset  . Alzheimer's disease Mother   . Cancer Mother   . Memory loss Father     Prior to Admission medications   Medication Sig Start Date End Date Taking? Authorizing Provider  albuterol (PROVENTIL) (2.5 MG/3ML) 0.083% nebulizer solution Take 3 mLs (2.5 mg total) by nebulization 2 (two) times daily as needed for wheezing or  shortness of breath. 04/09/18  Yes Irene Pap N, DO  atorvastatin (LIPITOR) 40 MG tablet Take 40 mg by mouth daily.    Yes [provider]  diphenhydrAMINE (BENADRYL) 25 MG tablet Take 1 tablet (25 mg total) by mouth every 6 (six) hours as needed for itching or allergies. 07/30/18  Yes Domenic Moras, PA-C  escitalopram (LEXAPRO) 20 MG tablet Take 20 mg by mouth daily.    Yes [provider]  lidocaine (XYLOCAINE) 2 % solution Use as directed 5 mLs in the mouth or throat every 3 (three) hours as needed for mouth pain. Gargle and spit 07/04/18  Yes Tanner, Lyndon Code., PA-C  pantoprazole (PROTONIX) 40 MG tablet Take 1 tablet (40 mg total) by mouth daily. Patient taking differently: Take 40 mg by mouth daily as needed (acid reflux).  04/10/18  Yes Kayleen Memos, DO  prochlorperazine (COMPAZINE) 10 MG tablet Take 1 tablet (10 mg total) by mouth every 6 (six) hours as needed for nausea or vomiting. 07/24/18  Yes Curt Bears, MD  aspirin EC 81 MG EC tablet Take 1 tablet (81 mg total) by mouth daily. Patient not taking: Reported on 11/05/2018 05/22/18   Antony Odea, PA-C  folic acid (FOLVITE) 1 MG tablet Take 1 tablet (1 mg total) by mouth daily. Patient not taking: Reported on 11/05/2018 06/14/18   Curt Bears, MD  potassium chloride SA (K-DUR) 20 MEQ tablet Take 1 tablet (20 mEq total) by mouth daily. Patient not taking: Reported on 11/05/2018 10/16/18   Curt Bears, MD    Physical Exam: Vitals:   11/05/18 2230 11/06/18 0030 11/06/18 0120 11/06/18 0123  BP: (!) 147/85 (!) 150/89  (!) 160/98  Pulse: 95 83  78  Resp: 18 20  20   Temp:    98.9 F (37.2 C)  TempSrc:    Oral  SpO2: 100% 94%  100%  Weight:   91.2 kg   Height:   6' (1.829 m)     Physical Exam  Constitutional: He is oriented to person, place, and time. He appears well-developed and well-nourished.  HENT:  Head: Normocephalic.  Dry mucous membranes  Eyes: EOM are normal. Right eye exhibits no discharge.  Left eye exhibits no discharge.  Neck: Neck supple.  Cardiovascular: Normal rate, regular rhythm and intact distal pulses.  Pulmonary/Chest: Effort normal and breath sounds normal. No respiratory distress. He has no wheezes. He has no rales.  Abdominal: Soft. Bowel sounds are normal. He exhibits no distension. There is abdominal tenderness. There is no rebound and no guarding.  Mild bilateral lower quadrant tenderness  Musculoskeletal:        General: No edema.  Neurological: He is alert and oriented to person, place, and time.  Speech fluent, tongue midline, no facial droop. Strength 5 out of 5 in bilateral upper and lower extremities. Sensation to light touch intact throughout.  Skin: Skin is warm and dry. He is not diaphoretic.     Labs on Admission: I have personally reviewed  following labs and imaging studies  CBC: Recent Labs  Lab 11/05/18 1959  WBC 6.3  NEUTROABS 4.5  HGB 10.8*  HCT 28.6*  MCV 91.4  PLT 956   Basic Metabolic Panel: Recent Labs  Lab 11/05/18 2100  NA 134*  K <2.0*  CL 89*  CO2 30  GLUCOSE 111*  BUN 18  CREATININE 1.53*  CALCIUM 7.1*  MG 1.1*   GFR: Estimated Creatinine Clearance: 56.4 mL/min (A) (by C-G formula based on SCr of 1.53 mg/dL (H)). Liver Function Tests: Recent Labs  Lab 11/05/18 2100  AST 56*  ALT 93*  ALKPHOS 77  BILITOT 1.0  PROT 5.7*  ALBUMIN 2.9*   Recent Labs  Lab 11/05/18 2100  LIPASE 83*   No results for input(s): AMMONIA in the last 168 hours. Coagulation Profile: No results for input(s): INR, PROTIME in the last 168 hours. Cardiac Enzymes: No results for input(s): CKTOTAL, CKMB, CKMBINDEX, TROPONINI in the last 168 hours. BNP (last 3 results) No results for input(s): PROBNP in the last 8760 hours. HbA1C: No results for input(s): HGBA1C in the last 72 hours. CBG: No results for input(s): GLUCAP in the last 168 hours. Lipid Profile: No results for input(s): CHOL, HDL, LDLCALC, TRIG, CHOLHDL, LDLDIRECT  in the last 72 hours. Thyroid Function Tests: No results for input(s): TSH, T4TOTAL, FREET4, T3FREE, THYROIDAB in the last 72 hours. Anemia Panel: No results for input(s): VITAMINB12, FOLATE, FERRITIN, TIBC, IRON, RETICCTPCT in the last 72 hours. Urine analysis:    Component Value Date/Time   COLORURINE YELLOW 08/08/2018 0509   APPEARANCEUR HAZY (A) 08/08/2018 0509   LABSPEC 1.025 08/08/2018 0509   PHURINE 5.0 08/08/2018 0509   GLUCOSEU NEGATIVE 08/08/2018 0509   HGBUR SMALL (A) 08/08/2018 0509   BILIRUBINUR NEGATIVE 08/08/2018 0509   KETONESUR NEGATIVE 08/08/2018 0509   PROTEINUR NEGATIVE 08/08/2018 0509   NITRITE NEGATIVE 08/08/2018 0509   LEUKOCYTESUR NEGATIVE 08/08/2018 0509    Radiological Exams on Admission: Dg Chest Port 1 View  Result Date: 11/05/2018 CLINICAL DATA:  Nausea and dizziness.  Lung cancer. EXAM: PORTABLE CHEST 1 VIEW COMPARISON:  08/17/2018 FINDINGS: Unchanged position of right chest wall pacemaker leads. The heart size and mediastinal contours are within normal limits. Both lungs are clear. The visualized skeletal structures are unremarkable. IMPRESSION: No active disease. Electronically Signed   By: Ulyses Jarred M.D.   On: 11/05/2018 20:20    EKG: Independently reviewed.  Sinus rhythm, QTc 518, PVCs, questionable Q waves in lateral leads.  Assessment/Plan Principal Problem:   Viral gastroenteritis Active Problems:   Hypokalemia   Orthostatic hypotension   Hypomagnesemia   AKI (acute kidney injury) (HCC)  Nausea, vomiting, diarrhea-suspect viral gastroenteritis No fever or leukocytosis.  Lipase 83. AST 56, ALT 93 (transaminases elevated 2 weeks ago as well).  Alk phos and T bili normal.  Has bilateral lower quadrant abdominal tenderness on exam. Patient reports "coffee ground" appearance of diarrhea.  No significant drop in hemoglobin from baseline. -IV fluid hydration -Compazine PRN nausea/vomiting -Loperamide -GI pathogen panel, enteric precautions  -COVID-19 test pending -Check lactic acid level -Repeat lipase and LFTs in a.m. -CT abdomen pelvis without contrast -Check FOBT  Lightheadedness-suspect related to orthostatic hypotension Orthostatics positive per EMS and patient received fluid boluses. -Continue IV fluid hydration -Repeat orthostatics in a.m.  Severe hypokalemia, hypomagnesemia Likely related to GI loss from vomiting and diarrhea. Potassium <2.0, magnesium 1.1. EKG with QT prolongation (QTc 518) and questionable U waves in lateral leads.   -Replete potassium  and magnesium -Continue to monitor potassium and magnesium levels closely -Cardiac monitoring -Keep potassium above 4 and magnesium above 2 -Avoid QT prolonging drugs if possible -Serial EKGs  Hypocalcemia Corrected calcium 8.0.   -Replete calcium and continue to monitor  AKI Likely prerenal related to dehydration. Creatinine 1.5, baseline 0.8-0.9.   -IV fluid hydration -Continue to monitor renal function -Monitor urine output -Avoid nephrotoxic agents  Mildly elevated troponin High-sensitivity troponin 25.  Patient has no complaints of chest pain.  EKG not suggestive of ACS. -Cardiac monitoring -Second high-sensitivity troponin level pending  Stage IV non-small cell lung cancer diagnosed in January 2020 currently on systemic chemotherapy -Ensure oncology follow-up  Asthma, COPD -Stable.  No wheezing or shortness of breath.  DuoNebs PRN.  Hyperlipidemia -Continue Lipitor  Depression -Continue Lexapro  GERD -Continue PPI  DVT prophylaxis: SCDs at this time.  FOBT pending. Code Status: Patient wishes to be full code. Family Communication: No family available at this time. Disposition Plan: Anticipate discharge after clinical improvement. Consults called: None Admission status: It is my clinical opinion that referral for OBSERVATION is reasonable and necessary in this patient based on the above information provided. The aforementioned taken  together are felt to place the patient at high risk for further clinical deterioration. However it is anticipated that the patient may be medically stable for discharge from the hospital within 24 to 48 hours.  The medical decision making on this patient was of high complexity and the patient is at high risk for clinical deterioration, therefore this is a level 3 visit.  Shela Leff MD [facility] Pager [phone]  If 7PM-7AM, please contact night-coverage [email or web address] Password [email or web address]  11/06/2018, 1:25 AM

## 2018-11-05 NOTE — ED Notes (Addendum)
Loss of IV access. Medication will be administered once another IV is established.

## 2018-11-05 NOTE — ED Triage Notes (Signed)
Pt arrives [facility] from home for nausea, dizziness. Pt has a hx of lung CA and is reported to be palliative care at this point. Pt was orthostatic with EMS, and received a 595ml NS bolus PTA.

## 2018-11-05 NOTE — ED Notes (Signed)
IV access attempted x2 via ultrasound without success. IV team consult ordered.

## 2018-11-05 NOTE — ED Notes (Signed)
Date and time results received: 11/05/18 10:37 PM  (use smartphrase ".now" to insert current time)  Test: Potassium Critical Value: <2.0  Name of Provider Notified: Dr.Lockwood  Orders Received? Or Actions Taken?:

## 2018-11-05 NOTE — ED Provider Notes (Signed)
[facility] DEPT Provider Note   CSN: [id number] Arrival date & time: 11/05/18  1833     History   Chief Complaint Chief Complaint  Patient presents with  . Dizziness    Orthostatic with EMS  . Nausea    HPI Daniel Bryant is a 58 y.o. male.     HPI Patient presents with concern of weakness, dizziness, nausea, vomiting, diarrhea. Patient has a history of lung cancer, now is in remission. He was feeling generally well until yesterday, when he had mild dizziness, seemingly unsettled sensation.  However, today, over the course the day he has been progressively more weak, with persistent nausea, vomiting, diarrhea. No abdominal pain, no pain anywhere. There is some dizziness/lightheadedness, but no syncope. No dyspnea, and he notes that his breathing has been unremarkable. Past Medical History:  Diagnosis Date  . Asthma   . Cancer ([place])   . COPD (chronic obstructive pulmonary disease) ([place])   . GERD (gastroesophageal reflux disease)   . High cholesterol   . History of petit-mal seizures   . Hyperlipidemia   . Hypertension   . Memory impairment   . OSA (obstructive sleep apnea)    Does not tolerate CPAP  . Pacemaker   . PTSD (post-traumatic stress disorder)   . Sick sinus syndrome [facility])     Patient Active Problem List   Diagnosis Date Noted  . Chemotherapy-induced neutropenia ([place])   . Neutropenic fever ([place]) 08/07/2018  . 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 Antineoplastic chemotherapy induced pancytopenia ([place]) 08/07/2018  . Hyponatremia 08/07/2018  . Drug-induced skin rash 07/24/2018  . Goals of care, counseling/discussion 06/14/2018  . Encounter for antineoplastic chemotherapy 06/14/2018  . Encounter for antineoplastic immunotherapy 06/14/2018  . 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 COPD (chronic obstructive pulmonary disease) ([place]) 05/02/2018  . Recurrent pleural effusion on right 05/01/2018  . Pacemaker  04/21/2018  . 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 Hypokalemia 04/05/2018  . High cholesterol 04/05/2018  . Hypertension 04/05/2018  . Polycythemia 04/05/2018    Past Surgical History:  Procedure Laterality Date  . CARDIAC SURGERY    . CHEST TUBE INSERTION Right 05/16/2018   Procedure: INSERTION PLEURAL DRAINAGE CATHETER;  Surgeon: Ivin Poot, MD;  Location: [facility];  Service: Thoracic;  Laterality: Right;  . CHEST TUBE INSERTION Right 05/16/2018   Procedure: Chest Tube Insertion;  Surgeon: Ivin Poot, MD;  Location: [facility];  Service: Thoracic;  Laterality: Right;  . PACEMAKER INSERTION    . REMOVAL OF PLEURAL DRAINAGE CATHETER Right 08/30/2018   Procedure: REMOVAL OF PLEURAL DRAINAGE CATHETER;  Surgeon: Ivin Poot, MD;  Location: [facility];  Service: Thoracic;  Laterality: Right;        Home Medications    Prior to Admission medications   Medication Sig Start Date End Date Taking? Authorizing Provider  albuterol (PROVENTIL) (2.5 MG/3ML) 0.083% nebulizer solution Take 3 mLs (2.5 mg total) by nebulization 2 (two) times daily as needed for wheezing or shortness of breath. 04/09/18  Yes Irene Pap N, DO  atorvastatin (LIPITOR) 40 MG tablet Take 40 mg by mouth daily.    Yes [provider]  diphenhydrAMINE (BENADRYL) 25 MG tablet Take 1 tablet (25 mg total) by mouth every 6 (six) hours as needed for itching or allergies. 07/30/18  Yes Domenic Moras, PA-C  escitalopram (LEXAPRO) 20 MG tablet Take 20 mg by mouth daily.    Yes [provider]  lidocaine (XYLOCAINE) 2 % solution Use  as directed 5 mLs in the mouth or throat every 3 (three) hours as needed for mouth pain. Gargle and spit 07/04/18  Yes Tanner, Lyndon Code., PA-C  pantoprazole (PROTONIX) 40 MG tablet Take 1 tablet (40 mg total) by mouth daily. Patient taking differently: Take 40 mg by mouth daily as needed (acid reflux).  04/10/18  Yes Kayleen Memos, DO  prochlorperazine (COMPAZINE) 10 MG tablet Take 1 tablet  (10 mg total) by mouth every 6 (six) hours as needed for nausea or vomiting. 07/24/18  Yes Curt Bears, MD  aspirin EC 81 MG EC tablet Take 1 tablet (81 mg total) by mouth daily. Patient not taking: Reported on 11/05/2018 05/22/18   Antony Odea, PA-C  folic acid (FOLVITE) 1 MG tablet Take 1 tablet (1 mg total) by mouth daily. Patient not taking: Reported on 11/05/2018 06/14/18   Curt Bears, MD  potassium chloride SA (K-DUR) 20 MEQ tablet Take 1 tablet (20 mEq total) by mouth daily. Patient not taking: Reported on 11/05/2018 10/16/18   Curt Bears, MD    Family History Family History  Problem Relation Age of Onset  . Alzheimer's disease Mother   . Cancer Mother   . Memory loss Father     Social History Social History   Tobacco Use  . Smoking status: Former Smoker    Types: E-cigarettes  . Smokeless tobacco: Current User    Types: Chew  Substance Use Topics  . Alcohol use: Yes    Frequency: Never  . Drug use: No     Allergies   Other   Review of Systems Review of Systems  Constitutional:       Per HPI, otherwise negative  HENT:       Per HPI, otherwise negative  Respiratory:       Per HPI, otherwise negative  Cardiovascular:       Per HPI, otherwise negative  Gastrointestinal: Positive for diarrhea, nausea and vomiting. Negative for abdominal pain.  Endocrine:       Negative aside from HPI  Genitourinary:       Neg aside from HPI   Musculoskeletal:       Per HPI, otherwise negative  Skin: Negative.   Neurological: Positive for dizziness, weakness and light-headedness. Negative for syncope.     Physical Exam Updated Vital Signs BP 139/89   Pulse 86   Temp 97.9 F (36.6 C) (Oral)   Resp (!) 25   Ht 6' (1.829 m)   Wt 83.9 kg   SpO2 98%   BMI 25.09 kg/m   Physical Exam Vitals signs and nursing note reviewed.  Constitutional:      Appearance: He is well-developed. He is ill-appearing.     Comments: Sickly appearing adult male awake  and alert  HENT:     Head: Normocephalic and atraumatic.  Eyes:     Conjunctiva/sclera: Conjunctivae normal.  Cardiovascular:     Rate and Rhythm: Normal rate and regular rhythm.  Pulmonary:     Effort: Pulmonary effort is normal. No respiratory distress.     Breath sounds: No stridor.  Abdominal:     General: There is no distension.     Tenderness: There is no abdominal tenderness. There is no guarding.  Skin:    General: Skin is warm and dry.  Neurological:     Mental Status: He is oriented to person, place, and time.      ED Treatments / Results  Labs (all labs ordered are listed, but  only abnormal results are displayed) Labs Reviewed  CBC WITH DIFFERENTIAL/PLATELET - Abnormal; Notable for the following components:      Result Value   RBC 3.13 (*)    Hemoglobin 10.8 (*)    HCT 28.6 (*)    MCH 34.5 (*)    MCHC 37.8 (*)    Abs Immature Granulocytes 0.13 (*)    All other components within normal limits  COMPREHENSIVE METABOLIC PANEL - Abnormal; Notable for the following components:   Sodium 134 (*)    Potassium <2.0 (*)    Chloride 89 (*)    Glucose, Bld 111 (*)    Creatinine, Ser 1.53 (*)    Calcium 7.1 (*)    Total Protein 5.7 (*)    Albumin 2.9 (*)    AST 56 (*)    ALT 93 (*)    GFR calc non Af Amer 49 (*)    GFR calc Af Amer 56 (*)    All other components within normal limits  LIPASE, BLOOD - Abnormal; Notable for the following components:   Lipase 83 (*)    All other components within normal limits  MAGNESIUM - Abnormal; Notable for the following components:   Magnesium 1.1 (*)    All other components within normal limits  TROPONIN I (HIGH SENSITIVITY) - Abnormal; Notable for the following components:   Troponin I (High Sensitivity) 25 (*)    All other components within normal limits  ETHANOL  TROPONIN I (HIGH SENSITIVITY)    EKG EKG Interpretation  Date/Time:  Monday November 05 2018 19:30:06 EDT Ventricular Rate:  85 PR Interval:    QRS  Duration: 107 QT Interval:  435 QTC Calculation: 518 R Axis:   19 Text Interpretation:  Sinus rhythm Ventricular bigeminy Non-specific intra-ventricular conduction delay ST-t wave abnormality Premature ventricular complexes Abnormal ECG Confirmed by Carmin Muskrat [phone]) on 11/05/2018 8:56:45 PM   Radiology Dg Chest Port 1 View  Result Date: 11/05/2018 CLINICAL DATA:  Nausea and dizziness.  Lung cancer. EXAM: PORTABLE CHEST 1 VIEW COMPARISON:  08/17/2018 FINDINGS: Unchanged position of right chest wall pacemaker leads. The heart size and mediastinal contours are within normal limits. Both lungs are clear. The visualized skeletal structures are unremarkable. IMPRESSION: No active disease. Electronically Signed   By: Ulyses Jarred M.D.   On: 11/05/2018 20:20    Procedures Procedures (including critical care time)  Medications Ordered in ED Medications  potassium chloride 10 mEq in 100 mL IVPB (has no administration in time range)  potassium chloride SA (K-DUR) CR tablet 40 mEq (has no administration in time range)  sodium chloride 0.9 % bolus 500 mL (0 mLs Intravenous Stopped 11/05/18 2105)     Initial Impression / Assessment and Plan / ED Course  I have reviewed the triage vital signs and the nursing notes.  Pertinent labs & imaging results that were available during my care of the patient were reviewed by me and considered in my medical decision making (see chart for details).        10:40 PM Labs notable for potassium less than 2, hypomagnesemia and acute kidney injury, with a creatinine 1.5, up from baseline 1.    Patient has a known history of hypokalemia, has previously been on supplements. On repeat exam is in similar condition, generally weak in appearance. Patient received IV potassium, 10 mEq/h, for 6 hours, oral repletion.  However, the patient's EKG is somewhat reassuring  This adult male now in remission for lung cancer therapy presents with  nausea, vomiting,  weakness, dizziness. Patient is found to have multiple lab abnormalities including evidence for acute kidney injury, hypomagnesemia, hypokalemia. Patient received empiric fluids on arrival, soon thereafter required repletion of both potassium and magnesium, as well as continued IV therapy. Given the patient's critical abnormalities required admission for further monitoring, management.  Final Clinical Impressions(s) / ED Diagnoses   Final diagnoses:  AKI (acute kidney injury) ([place])  Hypokalemia  Hypomagnesemia   CRITICAL CARE Performed by: Carmin Muskrat Total critical care time: 35 minutes Critical care time was exclusive of separately billable procedures and treating other patients. Critical care was necessary to treat or prevent imminent or life-threatening deterioration. Critical care was time spent personally by me on the following activities: development of treatment plan with patient and/or surrogate as well as nursing, discussions with consultants, evaluation of patient's response to treatment, examination of patient, obtaining history from patient or surrogate, ordering and performing treatments and interventions, ordering and review of laboratory studies, ordering and review of radiographic studies, pulse oximetry and re-evaluation of patient's condition.    Carmin Muskrat, MD 11/05/18 2244

## 2018-11-06 ENCOUNTER — Inpatient Hospital Stay: Payer: No Typology Code available for payment source

## 2018-11-06 ENCOUNTER — Observation Stay (HOSPITAL_COMMUNITY): Payer: No Typology Code available for payment source

## 2018-11-06 ENCOUNTER — Inpatient Hospital Stay: Payer: No Typology Code available for payment source | Admitting: Internal Medicine

## 2018-11-06 ENCOUNTER — Encounter (HOSPITAL_COMMUNITY): Payer: Self-pay | Admitting: Internal Medicine

## 2018-11-06 DIAGNOSIS — Z20828 Contact with and (suspected) exposure to other viral communicable diseases: Secondary | ICD-10-CM | POA: Diagnosis present

## 2018-11-06 DIAGNOSIS — Z9221 Personal history of antineoplastic chemotherapy: Secondary | ICD-10-CM | POA: Diagnosis not present

## 2018-11-06 DIAGNOSIS — Z809 Family history of malignant neoplasm, unspecified: Secondary | ICD-10-CM | POA: Diagnosis not present

## 2018-11-06 DIAGNOSIS — E785 Hyperlipidemia, unspecified: Secondary | ICD-10-CM | POA: Diagnosis present

## 2018-11-06 DIAGNOSIS — J449 Chronic obstructive pulmonary disease, unspecified: Secondary | ICD-10-CM | POA: Diagnosis present

## 2018-11-06 DIAGNOSIS — E876 Hypokalemia: Secondary | ICD-10-CM | POA: Diagnosis present

## 2018-11-06 DIAGNOSIS — R112 Nausea with vomiting, unspecified: Secondary | ICD-10-CM | POA: Diagnosis present

## 2018-11-06 DIAGNOSIS — A084 Viral intestinal infection, unspecified: Secondary | ICD-10-CM | POA: Diagnosis present

## 2018-11-06 DIAGNOSIS — F4310 Post-traumatic stress disorder, unspecified: Secondary | ICD-10-CM | POA: Diagnosis present

## 2018-11-06 DIAGNOSIS — F329 Major depressive disorder, single episode, unspecified: Secondary | ICD-10-CM | POA: Diagnosis present

## 2018-11-06 DIAGNOSIS — E860 Dehydration: Secondary | ICD-10-CM | POA: Diagnosis present

## 2018-11-06 DIAGNOSIS — Z950 Presence of cardiac pacemaker: Secondary | ICD-10-CM | POA: Diagnosis not present

## 2018-11-06 DIAGNOSIS — N179 Acute kidney failure, unspecified: Secondary | ICD-10-CM | POA: Diagnosis present

## 2018-11-06 DIAGNOSIS — Z7982 Long term (current) use of aspirin: Secondary | ICD-10-CM | POA: Diagnosis not present

## 2018-11-06 DIAGNOSIS — I951 Orthostatic hypotension: Secondary | ICD-10-CM | POA: Diagnosis present

## 2018-11-06 DIAGNOSIS — G4733 Obstructive sleep apnea (adult) (pediatric): Secondary | ICD-10-CM | POA: Diagnosis present

## 2018-11-06 DIAGNOSIS — F17220 Nicotine dependence, chewing tobacco, uncomplicated: Secondary | ICD-10-CM | POA: Diagnosis present

## 2018-11-06 DIAGNOSIS — I10 Essential (primary) hypertension: Secondary | ICD-10-CM | POA: Diagnosis present

## 2018-11-06 DIAGNOSIS — K219 Gastro-esophageal reflux disease without esophagitis: Secondary | ICD-10-CM | POA: Diagnosis present

## 2018-11-06 DIAGNOSIS — C3491 Malignant neoplasm of unspecified part of right bronchus or lung: Secondary | ICD-10-CM | POA: Diagnosis present

## 2018-11-06 DIAGNOSIS — Z79899 Other long term (current) drug therapy: Secondary | ICD-10-CM | POA: Diagnosis not present

## 2018-11-06 DIAGNOSIS — E7800 Pure hypercholesterolemia, unspecified: Secondary | ICD-10-CM | POA: Diagnosis present

## 2018-11-06 DIAGNOSIS — R197 Diarrhea, unspecified: Secondary | ICD-10-CM | POA: Diagnosis present

## 2018-11-06 LAB — BASIC METABOLIC PANEL
Anion gap: 12 (ref 5–15)
Anion gap: 10 (ref 5–15)
Anion gap: 12 (ref 5–15)
BUN: 18 mg/dL (ref 6–20)
BUN: 18 mg/dL (ref 6–20)
BUN: 17 mg/dL (ref 6–20)
CO2: 32 mmol/L (ref 22–32)
CO2: 29 mmol/L (ref 22–32)
CO2: 28 mmol/L (ref 22–32)
Calcium: 7.5 mg/dL — ABNORMAL LOW (ref 8.9–10.3)
Calcium: 7.4 mg/dL — ABNORMAL LOW (ref 8.9–10.3)
Calcium: 7.8 mg/dL — ABNORMAL LOW (ref 8.9–10.3)
Chloride: 88 mmol/L — ABNORMAL LOW (ref 98–111)
Chloride: 93 mmol/L — ABNORMAL LOW (ref 98–111)
Chloride: 93 mmol/L — ABNORMAL LOW (ref 98–111)
Creatinine, Ser: 1.46 mg/dL — ABNORMAL HIGH (ref 0.61–1.24)
Creatinine, Ser: 1.42 mg/dL — ABNORMAL HIGH (ref 0.61–1.24)
Creatinine, Ser: 1.42 mg/dL — ABNORMAL HIGH (ref 0.61–1.24)
GFR calc Af Amer: 60 mL/min — ABNORMAL LOW (ref >60)
GFR calc Af Amer: >60 mL/min (ref >60)
GFR calc Af Amer: >60 mL/min (ref >60)
GFR calc non Af Amer: 52 mL/min — ABNORMAL LOW (ref >60)
GFR calc non Af Amer: 53 mL/min — ABNORMAL LOW (ref >60)
GFR calc non Af Amer: 53 mL/min — ABNORMAL LOW (ref >60)
Glucose, Bld: 109 mg/dL — ABNORMAL HIGH (ref 70–99)
Glucose, Bld: 114 mg/dL — ABNORMAL HIGH (ref 70–99)
Glucose, Bld: 121 mg/dL — ABNORMAL HIGH (ref 70–99)
Potassium: 2.2 mmol/L — CL (ref 3.5–5.1)
Potassium: 2.3 mmol/L — CL (ref 3.5–5.1)
Potassium: 2.3 mmol/L — CL (ref 3.5–5.1)
Sodium: 132 mmol/L — ABNORMAL LOW (ref 135–145)
Sodium: 132 mmol/L — ABNORMAL LOW (ref 135–145)
Sodium: 133 mmol/L — ABNORMAL LOW (ref 135–145)

## 2018-11-06 LAB — CBC
HCT: 25.7 % — ABNORMAL LOW (ref 39.0–52.0)
Hemoglobin: 9.7 g/dL — ABNORMAL LOW (ref 13.0–17.0)
MCH: 34.8 pg — ABNORMAL HIGH (ref 26.0–34.0)
MCHC: 37.7 g/dL — ABNORMAL HIGH (ref 30.0–36.0)
MCV: 92.1 fL (ref 80.0–100.0)
Platelets: 204 10*3/uL (ref 150–400)
RBC: 2.79 MIL/uL — ABNORMAL LOW (ref 4.22–5.81)
RDW: 13.6 % (ref 11.5–15.5)
WBC: 6.8 10*3/uL (ref 4.0–10.5)
nRBC: 0 % (ref 0.0–0.2)

## 2018-11-06 LAB — HEPATIC FUNCTION PANEL
ALT: 82 U/L — ABNORMAL HIGH (ref 0–44)
AST: 50 U/L — ABNORMAL HIGH (ref 15–41)
Albumin: 2.9 g/dL — ABNORMAL LOW (ref 3.5–5.0)
Alkaline Phosphatase: 74 U/L (ref 38–126)
Bilirubin, Direct: 0.3 mg/dL — ABNORMAL HIGH (ref 0.0–0.2)
Indirect Bilirubin: 0.6 mg/dL (ref 0.3–0.9)
Total Bilirubin: 0.9 mg/dL (ref 0.3–1.2)
Total Protein: 5.6 g/dL — ABNORMAL LOW (ref 6.5–8.1)

## 2018-11-06 LAB — TROPONIN I (HIGH SENSITIVITY): Troponin I (High Sensitivity): 35 ng/L — ABNORMAL HIGH (ref <18)

## 2018-11-06 LAB — MAGNESIUM: Magnesium: 2.2 mg/dL (ref 1.7–2.4)

## 2018-11-06 LAB — LIPASE, BLOOD: Lipase: 69 U/L — ABNORMAL HIGH (ref 11–51)

## 2018-11-06 LAB — LACTIC ACID, PLASMA: Lactic Acid, Venous: 1 mmol/L (ref 0.5–1.9)

## 2018-11-06 LAB — SARS CORONAVIRUS 2 (TAT 6-24 HRS): SARS Coronavirus 2: NEGATIVE

## 2018-11-06 MED ORDER — ADULT MULTIVITAMIN W/MINERALS CH
1.0000 | ORAL_TABLET | Freq: Every day | ORAL | Status: DC
  Administered 2018-11-06 – 2018-11-12 (×7): 1 via ORAL
  Filled 2018-11-06 (×7): qty 1

## 2018-11-06 MED ORDER — POTASSIUM CHLORIDE 10 MEQ/100ML IV SOLN
INTRAVENOUS | Status: AC
  Administered 2018-11-06: 10 meq via INTRAVENOUS
  Filled 2018-11-06: qty 100

## 2018-11-06 MED ORDER — ACETAMINOPHEN 325 MG PO TABS
650.0000 mg | ORAL_TABLET | Freq: Four times a day (QID) | ORAL | Status: DC | PRN
  Administered 2018-11-07: 650 mg via ORAL
  Filled 2018-11-06: qty 2

## 2018-11-06 MED ORDER — POTASSIUM CHLORIDE 10 MEQ/100ML IV SOLN
10.0000 meq | INTRAVENOUS | Status: AC
  Administered 2018-11-06 – 2018-11-07 (×7): 10 meq via INTRAVENOUS
  Filled 2018-11-06 (×5): qty 100

## 2018-11-06 MED ORDER — POTASSIUM CHLORIDE CRYS ER 20 MEQ PO TBCR
40.0000 meq | EXTENDED_RELEASE_TABLET | Freq: Two times a day (BID) | ORAL | Status: DC
  Administered 2018-11-06 (×2): 40 meq via ORAL
  Filled 2018-11-06 (×2): qty 2

## 2018-11-06 MED ORDER — POTASSIUM CHLORIDE 10 MEQ/100ML IV SOLN
INTRAVENOUS | Status: AC
  Administered 2018-11-06: 08:00:00 10 meq via INTRAVENOUS
  Filled 2018-11-06: qty 100

## 2018-11-06 MED ORDER — ENSURE ENLIVE PO LIQD: 237.0000 mL | Freq: Two times a day (BID) | ORAL | Status: DC

## 2018-11-06 MED ORDER — PRO-STAT SUGAR FREE PO LIQD
30.0000 mL | Freq: Two times a day (BID) | ORAL | Status: DC
  Administered 2018-11-06 – 2018-11-12 (×12): 30 mL via ORAL
  Filled 2018-11-06 (×11): qty 30

## 2018-11-06 MED ORDER — SODIUM CHLORIDE 0.9 % IV SOLN
INTRAVENOUS | Status: DC
  Administered 2018-11-06 – 2018-11-07 (×3): via INTRAVENOUS

## 2018-11-06 MED ORDER — ALUM & MAG HYDROXIDE-SIMETH 200-200-20 MG/5ML PO SUSP
15.0000 mL | Freq: Four times a day (QID) | ORAL | Status: DC | PRN
  Administered 2018-11-06: 15 mL via ORAL
  Filled 2018-11-06: qty 30

## 2018-11-06 NOTE — Progress Notes (Signed)
CRITICAL VALUE ALERT  Critical Value:  K 2.2  Date & Time Notied:  11/06/18 @0455   Provider Notified: Lamar Blinks NP  Orders Received/Actions taken: No new orders

## 2018-11-06 NOTE — Progress Notes (Signed)
CRITICAL VALUE ALERT  Critical Value:  K 2.3  Date & Time Notied:  11/06/2018 1418  Provider Notified: Dr. Daleen Bo on unit and notified  Orders Received/Actions taken:  Ordered po and IV Potassium

## 2018-11-06 NOTE — Progress Notes (Signed)
CRITICAL VALUE ALERT  Critical Value:  K 2.3  Date & Time Notied:  11/06/2018 1029  Provider Notified:  Dr. Daleen Bo on unit and notified  Orders Received/Actions taken: Currently on last run of Potassium.  Will recheck Potassium after last run is completed.

## 2018-11-06 NOTE — ED Notes (Signed)
Pt will be transported to the floor after CT scan. Benjamine Mola, RN aware

## 2018-11-06 NOTE — ED Notes (Signed)
Admitting MD at bedside.

## 2018-11-06 NOTE — TOC Progression Note (Signed)
Transition of Care [facility]) - Progression Note    Patient Details  Name: Daniel Bryant MRN: [id number] Date of Birth: 08-01-1960  Transition [facility]) CM/SW Contact  Purcell Mouton, RN Phone Number: 11/06/2018, 10:26 AM  Clinical Narrative:    [facility]. Pt's PCP [facility]Bermudez at [facility] [phone] pages, [phone] ext [phone] office.         Expected Discharge Plan and Services                                                 Social Determinants of Health (SDOH) Interventions    Readmission Risk Interventions No flowsheet data found.

## 2018-11-06 NOTE — Progress Notes (Addendum)
[facility] PROGRESS NOTE  Daniel Bryant TSV:[id number] DOB: September 11, 1960 DOA: 11/05/2018 PCP: [facility], [facility]  Brief summary   58 y.o. male with medical history significant of stage IV non-small cell lung cancer diagnosed in January 2020 currently on systemic chemotherapy, asthma, COPD, GERD, hyperlipidemia, hypertension, OSA not on CPAP, sick sinus syndrome status post PPM, history of petit mal seizures currently not on antiepileptics presenting to the hospital via EMS for evaluation of generalized weakness, dizziness, vomiting, and diarrhea. Pt was orthostatic with EMS, and received a 577m NS bolus PTA.  Patient states for the past 2 days he is having continuous nausea, vomiting, and diarrhea.  Reports having nonbloody nonbilious emesis and diarrhea which is mostly light brown but sometimes "coffee ground" in appearance.  He is having mild bilateral lower quadrant abdominal discomfort.  He has not been able to tolerate any p.o. intake.  He has been feeling very lightheaded when he tries to get up or walk.  No headaches or focal weakness/numbness.  He is not sure if he has been having any fevers.  Denies any recent sick contacts.  No chest pain.  No other complaints.  ED Course: Hemodynamically stable on arrival.  No leukocytosis.  Hemoglobin 10.8, baseline in the 10-12 range.  Potassium <2.0, magnesium 1.1, chloride 89.  EKG with QT prolongation (QTc 518) and questionable U waves in lateral leads.  Corrected calcium 8.0.  Creatinine 1.5, baseline 0.8-0.9.  AST 56, ALT 93 (transaminases elevated 2 weeks ago as well).  Alk phos and T bili normal.  Lipase 83.  Blood ethanol level negative.  High-sensitivity troponin 25.  COVID-19 test pending.  Chest x-ray showing no active disease.   Assessment/Plan:  Nausea, vomiting, diarrhea-suspect viral gastroenteritis. No fever or leukocytosis.  Lipase 83. AST 56, ALT 93.  Alk phos and T bili normal.  -improving with supportive care, remains  afebrile, will obtain gi pathogen pcr. monitor   Dizziness. orthostatic hypotension.cont iv fluids. Monitor   Severe hypokalemia, hypomagnesemia, hypocalcemia,  due to gi loss. Replace. Monitor   AKI. Prerenal, volume depletion. Improving, cont iv fluids. Monitor urine output  Mildly elevated troponin. High-sensitivity troponin EKG not suggestive of ACS. No acute cardiopulmonary symptoms   Stage IV non-small cell lung cancer diagnosed in January 2020 currently on systemic chemotherapy -cont oncology follow-up  Asthma, COPD. No wheezing or shortness of breath.  DuoNebs PRN.  Hyperlipidemia. continue Lipitor  Due to severe electrolyte abnormalities, requiring iv replacement, iv fluids. Patient will need inpatient admission, expected >48 hrs stay  Code Status: full Family Communication: d/w patient, RN (indicate person spoken with, relationship, and if by phone, the number) Disposition Plan: remains inpatient    Consultants:  none  Procedures:  none  Antibiotics:  none (indicate start date, and stop date if known)  HPI/Subjective: Reports several episodes of diarrhea, no vomiting. No acute abdominal pains. Reports feeling better today.   Objective: Vitals:   11/06/18 0642 11/06/18 0852  BP: (!) 143/89   Pulse: 73   Resp: 16   Temp: 98.3 F (36.8 C)   SpO2: 100% 97%    Intake/Output Summary (Last 24 hours) at 11/06/2018 1330 Last data filed at 11/06/2018 0011 Gross per 24 hour  Intake 1.29 ml  Output -  Net 1.29 ml   Filed Weights   11/05/18 1958 11/06/18 0120  Weight: 83.9 kg 91.2 kg    Exam:   General:  No distress   Cardiovascular: s1,s2 rrr  Respiratory: CTA BL  Abdomen: soft,mild  tender. No rebound   Musculoskeletal: no leg edema    Data Reviewed: Basic Metabolic Panel: Recent Labs  Lab 11/05/18 2100 11/06/18 0407 11/06/18 0959  NA 134* 132* 132*  K <2.0* 2.2* 2.3*  CL 89* 88* 93*  CO2 30 32 29  GLUCOSE 111* 109* 114*  BUN _0 CREATININE 1.53* 1.46* 1.42*  CALCIUM 7.1* 7.5* 7.4*  MG 1.1* 2.2  --    Liver Function Tests: Recent Labs  Lab 11/05/18 2100 11/06/18 0407  AST 56* 50*  ALT 93* 82*  ALKPHOS 77 74  BILITOT 1.0 0.9  PROT 5.7* 5.6*  ALBUMIN 2.9* 2.9*   Recent Labs  Lab 11/05/18 2100 11/06/18 0407  LIPASE 83* 69*   No results for input(s): AMMONIA in the last 168 hours. CBC: Recent Labs  Lab 11/05/18 1959 11/06/18 0407  WBC 6.3 6.8  NEUTROABS 4.5  --   HGB 10.8* 9.7*  HCT 28.6* 25.7*  MCV 91.4 92.1  PLT 225 204   Cardiac Enzymes: No results for input(s): CKTOTAL, CKMB, CKMBINDEX, TROPONINI in the last 168 hours. BNP (last 3 results) Recent Labs    04/06/18 0601 05/01/18 1016  BNP 74.1 51.2    ProBNP (last 3 results) No results for input(s): PROBNP in the last 8760 hours.  CBG: No results for input(s): GLUCAP in the last 168 hours.  Recent Results (from the past 240 hour(s))  SARS CORONAVIRUS 2 (TAT 6-12 HRS) Nasal Swab Aptima Multi Swab     Status: None   Collection Time: 11/06/18 12:12 AM   Specimen: Aptima Multi Swab; Nasal Swab  Result Value Ref Range Status   SARS Coronavirus 2 NEGATIVE NEGATIVE Final    Comment: (NOTE) SARS-CoV-2 target nucleic acids are NOT DETECTED. The SARS-CoV-2 RNA is generally detectable in upper and lower respiratory specimens during the acute phase of infection. Negative results do not preclude SARS-CoV-2 infection, do not rule out co-infections with other pathogens, and should not be used as the sole basis for treatment or other patient management decisions. Negative results must be combined with clinical observations, patient history, and epidemiological information. The expected result is Negative. Fact Sheet for Patients: SugarRoll.be Fact Sheet for Healthcare Providers: https://www.woods-mathews.com/ This test is not yet approved or cleared by the [place] FDA and  has been  authorized for detection and/or diagnosis of SARS-CoV-2 by FDA under an Emergency Use Authorization (EUA). This EUA will remain  in effect (meaning this test can be used) for the duration of the COVID-19 declaration under Section 56 4(b)(1) of the Act, 21 U.S.C. section 360bbb-3(b)(1), unless the authorization is terminated or revoked sooner. Performed at [facility], [place] [address] [place], [place] [address]      Studies: Ct Abdomen Pelvis Wo Contrast  Result Date: 11/06/2018 CLINICAL DATA:  58 year old male with nausea vomiting and diarrhea. Low H & H. history of lung cancer. EXAM: CT ABDOMEN AND PELVIS WITHOUT CONTRAST TECHNIQUE: Multidetector CT imaging of the abdomen and pelvis was performed following the standard protocol without IV contrast. COMPARISON:  CT of the abdomen pelvis dated 08/07/2018 FINDINGS: Evaluation of this exam is limited in the absence of intravenous contrast. Lower chest: Several right lower lobe pulmonary nodules measure up to 7 mm and similar to prior CT. Right lower lobe pleural thickening and areas of nodularity. A 6.2 x 4.3 cm nodular masslike area at the right cardiophrenic angle most consistent with metastatic implants or abnormal lymph node. Cardiac pacemaker wire noted. No  intra-abdominal free air or free fluid. Hepatobiliary: A 1.5 cm hepatic dome cyst. No intrahepatic biliary ductal dilatation. Mild irregularity of the liver contour may represent early changes of cirrhosis. The gallbladder is unremarkable. Pancreas: Unremarkable. No pancreatic ductal dilatation or surrounding inflammatory changes. Spleen: Normal in size without focal abnormality. Adrenals/Urinary Tract: The adrenal glands are unremarkable. Bilateral lower pole nonobstructing calculi measure approximately 2 mm. There is no hydronephrosis on either side. Subcentimeter exophytic lesion from the inferior pole of the right kidney is too small to characterize. The visualized ureters appear  unremarkable. The urinary bladder is only partially distended. There is apparent diffuse thickening of the bladder wall which may be partly related to underdistention. Cystitis is not excluded. Correlation with urinalysis recommended. Stomach/Bowel: There is mild inflammatory changes of loops of distal small bowel in the right lower quadrant most consistent with enteritis. Loose stool noted throughout the colon compatible with diarrheal state. Correlation with clinical exam and stool cultures recommended. There is no bowel obstruction. The appendix is normal. Vascular/Lymphatic: Mild aortoiliac atherosclerotic disease. The IVC is unremarkable. No portal venous gas. There is no adenopathy. Reproductive: The prostate and seminal vesicles are grossly unremarkable. No pelvic mass. Other: None Musculoskeletal: No acute or significant osseous findings. IMPRESSION: 1. Enteritis of distal small bowel loops with diarrheal state. Correlation with clinical exam and stool cultures recommended. No bowel obstruction. Normal appendix. 2. Small nonobstructing bilateral renal calculi. 3. Underdistention of the bladder versus cystitis. Correlation with urinalysis recommended. 4. Right pleural based metastatic disease, similar or slightly improved. 5. Stable right lower lobe pulmonary nodules. 6. Aortic Atherosclerosis (ICD10-I70.0). Electronically Signed   By: Anner Crete M.D.   On: 11/06/2018 01:30   Dg Chest Port 1 View  Result Date: 11/05/2018 CLINICAL DATA:  Nausea and dizziness.  Lung cancer. EXAM: PORTABLE CHEST 1 VIEW COMPARISON:  08/17/2018 FINDINGS: Unchanged position of right chest wall pacemaker leads. The heart size and mediastinal contours are within normal limits. Both lungs are clear. The visualized skeletal structures are unremarkable. IMPRESSION: No active disease. Electronically Signed   By: Ulyses Jarred M.D.   On: 11/05/2018 20:20    Scheduled Meds: . atorvastatin  40 mg Oral Daily  . escitalopram   20 mg Oral Daily  . feeding supplement (ENSURE ENLIVE)  237 mL Oral BID BM  . pantoprazole  40 mg Oral Daily   Continuous Infusions:  Principal Problem:   Viral gastroenteritis Active Problems:   Hypokalemia   Orthostatic hypotension   Hypomagnesemia   AKI (acute kidney injury) ([place])    Time spent: >25 minutes    Kinnie Feil  [facility] Pager [phone]. If 7PM-7AM, please contact night-coverage at [email or web address], password [facility] 11/06/2018, 1:30 PM  LOS: 0 days

## 2018-11-06 NOTE — Progress Notes (Signed)
Initial Nutrition Assessment  RD working remotely.   DOCUMENTATION CODES:   Not applicable  INTERVENTION:  - will d/c Ensure Enlive. - will order 30 mL Prostat TID, each supplement provides 100 kcal and 15 grams of protein. - will order daily multivitamin with minerals. - continue to encourage PO intakes.    NUTRITION DIAGNOSIS:   Increased nutrient needs related to chronic illness, cancer and cancer related treatments as evidenced by estimated needs.  GOAL:   Patient will meet greater than or equal to 90% of their needs  MONITOR:   PO intake, Supplement acceptance, Labs, Weight trends, I & O's  REASON FOR ASSESSMENT:   Malnutrition Screening Tool  ASSESSMENT:   58 y.o. male with medical history significant of stage 4 non-small cell lung cancer diagnosed in 03/2018 currently on systemic chemotherapy, asthma, COPD, GERD, hyperlipidemia, HTN, OSA not on CPAP, sick sinus syndrome s/p PPM, and history of petit mal seizures not on anti-epileptics. He presented to the ED on 8/24 for the evaluation of generalized weakness, dizziness, vomiting, and diarrhea. He reported that for the 2 days PTA he was experiencing continuous N/V/D; sometimes coffee-ground. He was also experiencing mid-lower quadrant abdominal pain/pressure and was unable to tolerate PO intakes. He was feeling lightheaded when he would stand or attempt to walk.  Patient was able to eat 100% of breakfast and 50% of lunch today. Unable to talk with patient this afternoon.   Per chart review, current weight is 201 lb, weight on 8/4 was 217 lb (weight had been stable at this weight for 2 months), and weight on 5/29 was 229 lb. This indicates 28 lb weight loss (12% body weight) in the past 3 months; significant for time frame.  Per notes: - N/V/D--GI PCR pending with suspicion of gastroenteritis - dizziness and orthostatic hypotension--IV fluids ordered - severe hypokalemia, hypomagnesemia, hypocalcemia--thought to be d/t  GI losses, dehydration - AKI - stage 4 NSCLC--on systemic chemo   Labs reviewed; Na: 133 mmol/l, K: 2.3 mmol/l, Cl: 93 mmol/l, creatinine: 1.42 mg/dl, Ca: 7.8 mg/dl, GFR: 53 ml/min.  Medications reviewed; 2 g IV Mg sulfate x2 runs 8/24, 10 mEq IV KCl x6 runs 8/25, 40 mEq K-Dur x1 dose 8/24 and x1 dose 8/25. IVF; NS @ 125 ml/hr.     NUTRITION - FOCUSED PHYSICAL EXAM:  unable to complete at this time.   Diet Order:   Diet Order            Diet Heart Room service appropriate? Yes; Fluid consistency: Thin  Diet effective now              EDUCATION NEEDS:   No education needs have been identified at this time  Skin:  Skin Assessment: Reviewed RN Assessment  Last BM:  8/25  Height:   Ht Readings from Last 1 Encounters:  11/06/18 6' (1.829 m)    Weight:   Wt Readings from Last 1 Encounters:  11/06/18 91.2 kg    Ideal Body Weight:  80.9 kg  BMI:  Body mass index is 27.27 kg/m.  Estimated Nutritional Needs:   Kcal:  2280-2550 kcal  Protein:  115-125 grams  Fluid:  >/= 2.3 L/day      Jarome Matin, MS, RD, LDN, [facility] Inpatient Clinical Dietitian Pager # [phone] After hours/weekend pager # [phone]

## 2018-11-06 NOTE — ED Notes (Signed)
ED TO INPATIENT HANDOFF REPORT  Name/Age/Gender Daniel Bryant 58 y.o. male  Code Status Code Status History    Date Active Date Inactive Code Status Order ID Comments User Context   08/07/2018 1746 08/10/2018 2058 Full Code [id number]  Kerney Elbe, [place] Inpatient   05/15/2018 2114 05/22/2018 1931 Full Code [id number]  Ivin Poot, MD ED   05/15/2018 2053 05/15/2018 2114 Full Code [id number]  Etta Quill, DO ED   05/01/2018 1426 05/03/2018 1523 Full Code [id number]  Elodia Florence., MD Inpatient   04/20/2018 1620 04/21/2018 2016 Full Code [id number]  Caren Griffins, MD Inpatient   04/05/2018 1835 04/09/2018 2124 Full Code [id number]  Reubin Milan, MD Inpatient   04/05/2018 1239 04/05/2018 [place] Full Code [id number]  Reubin Milan, MD ED   Advance Care Planning Activity      Home/SNF/Other Home  Chief Complaint Nausea; Vomiting; Dizziness; Cancer Patient  Level of Care/Admitting Diagnosis ED Disposition    ED Disposition Condition Comment   Admit  Hospital Area: [facility] [[id number]]  Level of Care: Telemetry [5]  Admit to tele based on following criteria: Monitor QTC interval  Covid Evaluation: Person Under Investigation (PUI)  Diagnosis: Viral gastroenteritis [599357]  Admitting Physician: Shela Leff [[id number]]  Attending Physician: Shela Leff [[id number]]  PT Class (Do Not Modify): Observation [104]  PT Acc Code (Do Not Modify): Observation [10022]       Medical History Past Medical History:  Diagnosis Date  . Asthma   . Cancer ([place])   . COPD (chronic obstructive pulmonary disease) ([place])   . GERD (gastroesophageal reflux disease)   . High cholesterol   . History of petit-mal seizures   . Hyperlipidemia   . Hypertension   . Memory impairment   . OSA (obstructive sleep apnea)    Does not tolerate CPAP  . Pacemaker   . PTSD (post-traumatic stress disorder)   . Sick sinus syndrome ([facility])     Allergies Allergies   Allergen Reactions  . Other Other (See Comments)    Patient states "I had a scrotal procedure a couple of years ago, there was something they gave me to relax me and I started freaking out and seeing things".  Unable to ascertain from pt or his records what medication was.    IV Location/Drains/Wounds Patient Lines/Drains/Airways Status   Active Line/Drains/Airways    Name:   Placement date:   Placement time:   Site:   Days:   Peripheral IV 11/06/18 Right Forearm   11/06/18    0006    Forearm   less than 1          Labs/Imaging Results for orders placed or performed during the hospital encounter of 11/05/18 (from the past 48 hour(s))  Ethanol     Status: None   Collection Time: 11/05/18  7:59 PM  Result Value Ref Range   Alcohol, Ethyl (B) <10 <10 mg/dL    Comment: (NOTE) Lowest detectable limit for serum alcohol is 10 mg/dL. For medical purposes only. Performed at [facility], [place] [address], [place], [place] [address]   CBC with Differential     Status: Abnormal   Collection Time: 11/05/18  7:59 PM  Result Value Ref Range   WBC 6.3 4.0 - 10.5 K/uL   RBC 3.13 (L) 4.22 - 5.81 MIL/uL   Hemoglobin 10.8 (L) 13.0 - 17.0 g/dL   HCT 28.6 (L) 39.0 - 52.0 %   MCV 91.4  80.0 - 100.0 fL   MCH 34.5 (H) 26.0 - 34.0 pg   MCHC 37.8 (H) 30.0 - 36.0 g/dL   RDW 13.6 11.5 - 15.5 %   Platelets 225 150 - 400 K/uL   nRBC 0.0 0.0 - 0.2 %   Neutrophils Relative % 70 %   Neutro Abs 4.5 1.7 - 7.7 K/uL   Lymphocytes Relative 18 %   Lymphs Abs 1.1 0.7 - 4.0 K/uL   Monocytes Relative 9 %   Monocytes Absolute 0.6 0.1 - 1.0 K/uL   Eosinophils Relative 1 %   Eosinophils Absolute 0.0 0.0 - 0.5 K/uL   Basophils Relative 0 %   Basophils Absolute 0.0 0.0 - 0.1 K/uL   Immature Granulocytes 2 %   Abs Immature Granulocytes 0.13 (H) 0.00 - 0.07 K/uL    Comment: Performed at [facility], [place] [address], [place], [place] [address]  Comprehensive metabolic panel      Status: Abnormal   Collection Time: 11/05/18  9:00 PM  Result Value Ref Range   Sodium 134 (L) 135 - 145 mmol/L   Potassium <2.0 (LL) 3.5 - 5.1 mmol/L    Comment: CRITICAL RESULT CALLED TO, READ BACK BY AND VERIFIED WITH: AILEEN HODGES @ 2235 ON 11/05/2018 C VARNER    Chloride 89 (L) 98 - 111 mmol/L   CO2 30 22 - 32 mmol/L   Glucose, Bld 111 (H) 70 - 99 mg/dL   BUN 18 6 - 20 mg/dL   Creatinine, Ser 1.53 (H) 0.61 - 1.24 mg/dL   Calcium 7.1 (L) 8.9 - 10.3 mg/dL   Total Protein 5.7 (L) 6.5 - 8.1 g/dL   Albumin 2.9 (L) 3.5 - 5.0 g/dL   AST 56 (H) 15 - 41 U/L   ALT 93 (H) 0 - 44 U/L   Alkaline Phosphatase 77 38 - 126 U/L   Total Bilirubin 1.0 0.3 - 1.2 mg/dL   GFR calc non Af Amer 49 (L) >60 mL/min   GFR calc Af Amer 56 (L) >60 mL/min   Anion gap 15 5 - 15    Comment: Performed at [facility], [place] [address], [place], [place] [address]  Lipase, blood     Status: Abnormal   Collection Time: 11/05/18  9:00 PM  Result Value Ref Range   Lipase 83 (H) 11 - 51 U/L    Comment: Performed at [facility], [place] [address] [place], [place] [address]  Magnesium     Status: Abnormal   Collection Time: 11/05/18  9:00 PM  Result Value Ref Range   Magnesium 1.1 (L) 1.7 - 2.4 mg/dL    Comment: Performed at [facility], [place] [address], [place], [place] [address]  Troponin I (High Sensitivity)     Status: Abnormal   Collection Time: 11/05/18  9:00 PM  Result Value Ref Range   Troponin I (High Sensitivity) 25 (H) <18 ng/L    Comment: (NOTE) Elevated high sensitivity troponin I (hsTnI) values and significant  changes across serial measurements may suggest ACS but many other  chronic and acute conditions are known to elevate hsTnI results.  Refer to the Links section for chest pain algorithms and additional  guidance. Performed at [facility], [place] [address], [place], [place] [address]    Dg Chest Port 1  View  Result Date: 11/05/2018 CLINICAL DATA:  Nausea and dizziness.  Lung cancer. EXAM: PORTABLE CHEST 1 VIEW COMPARISON:  08/17/2018 FINDINGS: Unchanged  position of right chest wall pacemaker leads. The heart size and mediastinal contours are within normal limits. Both lungs are clear. The visualized skeletal structures are unremarkable. IMPRESSION: No active disease. Electronically Signed   By: Ulyses Jarred M.D.   On: 11/05/2018 20:20    Pending Labs Unresulted Labs (From admission, onward)    Start     Ordered   11/06/18 0500  Hepatic function panel  Tomorrow morning,   R     11/05/18 2339   11/06/18 0500  Lipase, blood  Tomorrow morning,   R     11/05/18 2339   11/06/18 0500  CBC  Tomorrow morning,   R     11/05/18 2339   11/06/18 0500  Magnesium  Tomorrow morning,   R     11/05/18 2339   11/06/18 0500  Calcium, ionized  Tomorrow morning,   R     11/05/18 2339   11/06/18 0021  Occult blood card to lab, stool  Once,   R     11/06/18 0020   11/06/18 3846  Basic metabolic panel  Now then every 6 hours,   R (with STAT occurrences)     11/05/18 2339   11/05/18 2342  Lactic acid, plasma  ONCE - STAT,   STAT     11/05/18 2341   11/05/18 2339  Gastrointestinal Panel by PCR , Stool  (Gastrointestinal Panel by PCR, Stool)  Once,   STAT     11/05/18 2339   11/05/18 2248  SARS CORONAVIRUS 2 (TAT 6-12 HRS) Nasal Swab Aptima Multi Swab  (Asymptomatic/Tier 2 Patients Labs)  Once,   STAT    Question Answer Comment  Is this test for diagnosis or screening Screening   Symptomatic for COVID-19 as defined by CDC No   Hospitalized for COVID-19 No   Admitted to ICU for COVID-19 No   Previously tested for COVID-19 No   Resident in a congregate (group) care setting No   Employed in healthcare setting No      11/05/18 2248          Vitals/Pain Today's Vitals   11/05/18 1958 11/05/18 2100 11/05/18 2105 11/05/18 2230  BP:  139/89  (!) 147/85  Pulse:  86  95  Resp:    18  Temp:       TempSrc:      SpO2:  98%  100%  Weight: 83.9 kg     Height: 6' (1.829 m)     PainSc:   0-No pain     Isolation Precautions Enteric precautions (UV disinfection)  Medications Medications  potassium chloride 10 mEq in 100 mL IVPB ( Intravenous Rate/Dose Verify 11/06/18 0011)  magnesium sulfate IVPB 2 g 50 mL (2 g Intravenous New Bag/Given 11/06/18 0009)  atorvastatin (LIPITOR) tablet 40 mg (has no administration in time range)  escitalopram (LEXAPRO) tablet 20 mg (has no administration in time range)  pantoprazole (PROTONIX) EC tablet 40 mg (has no administration in time range)  diphenhydrAMINE (BENADRYL) tablet 25 mg (has no administration in time range)  lidocaine (XYLOCAINE) 2 % viscous mouth solution 5 mL (has no administration in time range)  0.9 %  sodium chloride infusion (has no administration in time range)  magnesium sulfate IVPB 2 g 50 mL (has no administration in time range)  potassium chloride SA (K-DUR) CR tablet 40 mEq (has no administration in time range)  calcium gluconate 2 g/ 100 mL sodium chloride IVPB (has no administration in time range)  prochlorperazine (  COMPAZINE) injection 5 mg (has no administration in time range)  loperamide (IMODIUM) capsule 2 mg (has no administration in time range)  ipratropium-albuterol (DUONEB) 0.5-2.5 (3) MG/3ML nebulizer solution 3 mL (has no administration in time range)  sodium chloride 0.9 % bolus 500 mL (0 mLs Intravenous Stopped 11/05/18 2105)  potassium chloride SA (K-DUR) CR tablet 40 mEq (40 mEq Oral Given 11/06/18 0010)  sodium chloride 0.9 % bolus 1,000 mL (1,000 mLs Intravenous New Bag/Given 11/06/18 0008)    Mobility walks with person assist (gets dizzy)

## 2018-11-07 LAB — CBC
HCT: 21.5 % — ABNORMAL LOW (ref 39.0–52.0)
Hemoglobin: 7.8 g/dL — ABNORMAL LOW (ref 13.0–17.0)
MCH: 34.1 pg — ABNORMAL HIGH (ref 26.0–34.0)
MCHC: 36.3 g/dL — ABNORMAL HIGH (ref 30.0–36.0)
MCV: 93.9 fL (ref 80.0–100.0)
Platelets: 207 10*3/uL (ref 150–400)
RBC: 2.29 MIL/uL — ABNORMAL LOW (ref 4.22–5.81)
RDW: 13.7 % (ref 11.5–15.5)
WBC: 5.6 10*3/uL (ref 4.0–10.5)
nRBC: 0 % (ref 0.0–0.2)

## 2018-11-07 LAB — BASIC METABOLIC PANEL
Anion gap: 6 (ref 5–15)
Anion gap: 5 (ref 5–15)
BUN: 11 mg/dL (ref 6–20)
BUN: 10 mg/dL (ref 6–20)
CO2: 27 mmol/L (ref 22–32)
CO2: 27 mmol/L (ref 22–32)
Calcium: 7.4 mg/dL — ABNORMAL LOW (ref 8.9–10.3)
Calcium: 7.7 mg/dL — ABNORMAL LOW (ref 8.9–10.3)
Chloride: 102 mmol/L (ref 98–111)
Chloride: 105 mmol/L (ref 98–111)
Creatinine, Ser: 1.26 mg/dL — ABNORMAL HIGH (ref 0.61–1.24)
Creatinine, Ser: 1.21 mg/dL (ref 0.61–1.24)
GFR calc Af Amer: >60 mL/min (ref >60)
GFR calc Af Amer: >60 mL/min (ref >60)
GFR calc non Af Amer: >60 mL/min (ref >60)
GFR calc non Af Amer: >60 mL/min (ref >60)
Glucose, Bld: 94 mg/dL (ref 70–99)
Glucose, Bld: 98 mg/dL (ref 70–99)
Potassium: 2.6 mmol/L — CL (ref 3.5–5.1)
Potassium: 2.8 mmol/L — ABNORMAL LOW (ref 3.5–5.1)
Sodium: 135 mmol/L (ref 135–145)
Sodium: 137 mmol/L (ref 135–145)

## 2018-11-07 LAB — GASTROINTESTINAL PANEL BY PCR, STOOL (REPLACES STOOL CULTURE)

## 2018-11-07 LAB — CALCIUM, IONIZED: Calcium, Ionized, Serum: 4.4 mg/dL — ABNORMAL LOW (ref 4.5–5.6)

## 2018-11-07 LAB — OCCULT BLOOD X 1 CARD TO LAB, STOOL: Fecal Occult Bld: NEGATIVE

## 2018-11-07 MED ORDER — POTASSIUM CHLORIDE CRYS ER 20 MEQ PO TBCR
40.0000 meq | EXTENDED_RELEASE_TABLET | Freq: Once | ORAL | Status: AC
  Administered 2018-11-07: 40 meq via ORAL
  Filled 2018-11-07: qty 2

## 2018-11-07 MED ORDER — POTASSIUM CHLORIDE 10 MEQ/100ML IV SOLN
INTRAVENOUS | Status: AC
  Administered 2018-11-07: 10 meq via INTRAVENOUS
  Filled 2018-11-07: qty 100

## 2018-11-07 MED ORDER — LACTATED RINGERS IV SOLN
INTRAVENOUS | Status: DC
  Administered 2018-11-07 – 2018-11-08 (×2): via INTRAVENOUS

## 2018-11-07 MED ORDER — POTASSIUM CHLORIDE 10 MEQ/100ML IV SOLN
10.0000 meq | INTRAVENOUS | Status: DC
  Administered 2018-11-07 (×5): 10 meq via INTRAVENOUS
  Filled 2018-11-07 (×5): qty 100

## 2018-11-07 MED ORDER — POTASSIUM CHLORIDE CRYS ER 20 MEQ PO TBCR
40.0000 meq | EXTENDED_RELEASE_TABLET | Freq: Two times a day (BID) | ORAL | Status: DC
  Administered 2018-11-07 (×2): 40 meq via ORAL
  Filled 2018-11-07 (×2): qty 2

## 2018-11-07 NOTE — Progress Notes (Signed)
[facility] Progress Note  Patient: Daniel Bryant JOI:[id number]   PCP[facility] DOB: 20-Jul-1960   DOA: 11/05/2018   DOS: 11/07/2018   Date of Service: the patient was seen and examined on 11/07/2018  Brief hospital course: Pt. with PMH of stage IV non-small cell lung cancer, systemic chemotherapy, asthma, COPD, GERD, HLD, HTN, OSA not on CPAP, S/P PPM, history of seizures not on any AED; admitted on 11/05/2018, presented with complaint of nausea vomiting and diarrhea, was found to have acute dehydration, hypokalemia likely due to viral gastroenteritis. Currently further plan is continue IV fluid and assistance continue to monitor on telemetry.  Subjective: Had 5 bowel movement so far loose and watery.  No blood in the stool.  No further nausea reported.  Oral intake is improving.  No abdominal pain.  No fever no chills.  Assessment and Plan: 1.  Acute viral gastroenteritis. Nausea and vomiting. Diarrhea. Dizziness Orthostatic hypotension Dehydration Acute kidney injury. Hypokalemia.  Hypomagnesemia No fever no leukocytosis.  Normal lipase.  Relatively normal LFTs. Improving with supportive care. We will continue with IV hydration continue with potassium supplementation. Also continue with magnesium supplementation. Suspect with the patient's GI loss with ongoing diarrhea is still significantly high to be replaced p.o. Continue to monitor in the hospital. Due to severe hypokalemia we will continue on telemetry monitoring as well. No significant abnormality on telemetry so far. Dizziness is resolved.  Orthostatics has resolved. Acute kidney injury also resolved.  2.  Stage IV non-small cell lung cancer. Diagnosed in January 2020. Currently systemic chemotherapy. Continue outpatient oncology follow-up.  3.  Asthma. COPD. OSA not on CPAP. DuoNebs as needed. No wheezing no active shortness of breath.  4.  HLD. Continue Lipitor.  5.  Hypertension. Patient  is on lisinopril 5 mg. Currently on hold.   Diet: Regular diet DVT Prophylaxis: Subcutaneous Lovenox  Advance goals of care discussion: full code  Family Communication: no family was present at bedside, at the time of interview.   Disposition:  Discharge to Home .  Consultants: none Procedures: none  Scheduled Meds: . atorvastatin  40 mg Oral Daily  . escitalopram  20 mg Oral Daily  . feeding supplement (PRO-STAT SUGAR FREE 64)  30 mL Oral BID  . multivitamin with minerals  1 tablet Oral Daily  . pantoprazole  40 mg Oral Daily  . potassium chloride  40 mEq Oral BID   Continuous Infusions: . [COMPLETED] potassium chloride 10 mEq (11/07/18 1620)   PRN Meds: acetaminophen, alum & mag hydroxide-simeth, diphenhydrAMINE, ipratropium-albuterol, lidocaine, loperamide, prochlorperazine Antibiotics: Anti-infectives (From admission, onward)   None       Objective: Physical Exam: Vitals:   11/06/18 1403 11/06/18 2019 11/07/18 0614 11/07/18 1234  BP: (!) 152/90 (!) 154/94 113/61 (!) 146/103  Pulse: 84 89 73 86  Resp: 18 20 18  (!) 21  Temp: 97.9 F (36.6 C) 98.7 F (37.1 C) 98.1 F (36.7 C) 98 F (36.7 C)  TempSrc: Oral Oral Oral Oral  SpO2: 100% 98% 100% 100%  Weight:      Height:        Intake/Output Summary (Last 24 hours) at 11/07/2018 1703 Last data filed at 11/07/2018 1500 Gross per 24 hour  Intake 3430.72 ml  Output -  Net 3430.72 ml   Filed Weights   11/05/18 1958 11/06/18 0120  Weight: 83.9 kg 91.2 kg   General: alert and oriented to time, place, and person. Appear in mild distress, affect appropriate Eyes: PERRL, Conjunctiva  normal ENT: Oral Mucosa Clear, moist  Neck: no JVD, no Abnormal Mass Or lumps Cardiovascular: S1 and S2 Present, no Murmur, peripheral pulses symmetrical Respiratory: normal respiratory effort, Bilateral Air entry equal and Decreased, no use of accessory muscle, Clear to Auscultation, no Crackles, no wheezes Abdomen: Bowel Sound  present, Soft and no tenderness, no hernia Skin: no rashes  Extremities: no Pedal edema, no calf tenderness Neurologic: normal without focal findings, mental status, speech normal, alert and oriented x3, PERLA, Motor strength 5/5 and symmetric and sensation grossly normal to light touch Gait not checked due to patient safety concerns  Data Reviewed: CBC: Recent Labs  Lab 11/05/18 1959 11/06/18 0407 11/07/18 0351  WBC 6.3 6.8 5.6  NEUTROABS 4.5  --   --   HGB 10.8* 9.7* 7.8*  HCT 28.6* 25.7* 21.5*  MCV 91.4 92.1 93.9  PLT 225 204 520   Basic Metabolic Panel: Recent Labs  Lab 11/05/18 2100 11/06/18 0407 11/06/18 0959 11/06/18 1355 11/07/18 0351 11/07/18 1157  NA 134* 132* 132* 133* 135 137  K <2.0* 2.2* 2.3* 2.3* 2.6* 2.8*  CL 89* 88* 93* 93* 102 105  CO2 30 32 29 28 27 27   GLUCOSE 111* 109* 114* 121* 94 98  BUN 18 18 18 17 11 10   CREATININE 1.53* 1.46* 1.42* 1.42* 1.26* 1.21  CALCIUM 7.1* 7.5* 7.4* 7.8* 7.4* 7.7*  MG 1.1* 2.2  --   --   --   --     Liver Function Tests: Recent Labs  Lab 11/05/18 2100 11/06/18 0407  AST 56* 50*  ALT 93* 82*  ALKPHOS 77 74  BILITOT 1.0 0.9  PROT 5.7* 5.6*  ALBUMIN 2.9* 2.9*   Recent Labs  Lab 11/05/18 2100 11/06/18 0407  LIPASE 83* 69*   No results for input(s): AMMONIA in the last 168 hours. Coagulation Profile: No results for input(s): INR, PROTIME in the last 168 hours. Cardiac Enzymes: No results for input(s): CKTOTAL, CKMB, CKMBINDEX, TROPONINI in the last 168 hours. BNP (last 3 results) No results for input(s): PROBNP in the last 8760 hours. CBG: No results for input(s): GLUCAP in the last 168 hours. Studies: No results found.   Time spent: 35 minutes  Author: Berle Mull, MD [facility] 11/07/2018 5:03 PM  To reach On-call, see care teams to locate the attending and reach out to them via [email or web address]. If 7PM-7AM, please contact night-coverage If you still have difficulty reaching the attending  provider, please page the [facility] (Director on Call) for [facility] on amion for assistance.

## 2018-11-07 NOTE — Progress Notes (Signed)
CRITICAL VALUE ALERT  Critical Value:  Potassium 2.6  Date & Time Notied:  11/07/18@0600   Provider Notified: Yes  Orders Received/Actions taken: yes

## 2018-11-08 LAB — BASIC METABOLIC PANEL
Anion gap: 11 (ref 5–15)
Anion gap: 6 (ref 5–15)
BUN: 7 mg/dL (ref 6–20)
BUN: 7 mg/dL (ref 6–20)
CO2: 21 mmol/L — ABNORMAL LOW (ref 22–32)
CO2: 23 mmol/L (ref 22–32)
Calcium: 7.9 mg/dL — ABNORMAL LOW (ref 8.9–10.3)
Calcium: 8.2 mg/dL — ABNORMAL LOW (ref 8.9–10.3)
Chloride: 103 mmol/L (ref 98–111)
Chloride: 108 mmol/L (ref 98–111)
Creatinine, Ser: 1.11 mg/dL (ref 0.61–1.24)
Creatinine, Ser: 1.11 mg/dL (ref 0.61–1.24)
GFR calc Af Amer: >60 mL/min (ref >60)
GFR calc Af Amer: >60 mL/min (ref >60)
GFR calc non Af Amer: >60 mL/min (ref >60)
GFR calc non Af Amer: >60 mL/min (ref >60)
Glucose, Bld: 95 mg/dL (ref 70–99)
Glucose, Bld: 100 mg/dL — ABNORMAL HIGH (ref 70–99)
Potassium: 2.7 mmol/L — CL (ref 3.5–5.1)
Potassium: 3.1 mmol/L — ABNORMAL LOW (ref 3.5–5.1)
Sodium: 135 mmol/L (ref 135–145)
Sodium: 137 mmol/L (ref 135–145)

## 2018-11-08 LAB — CBC
HCT: 25.2 % — ABNORMAL LOW (ref 39.0–52.0)
Hemoglobin: 9.2 g/dL — ABNORMAL LOW (ref 13.0–17.0)
MCH: 34.7 pg — ABNORMAL HIGH (ref 26.0–34.0)
MCHC: 36.5 g/dL — ABNORMAL HIGH (ref 30.0–36.0)
MCV: 95.1 fL (ref 80.0–100.0)
Platelets: 265 10*3/uL (ref 150–400)
RBC: 2.65 MIL/uL — ABNORMAL LOW (ref 4.22–5.81)
RDW: 13.8 % (ref 11.5–15.5)
WBC: 8.5 10*3/uL (ref 4.0–10.5)
nRBC: 0 % (ref 0.0–0.2)

## 2018-11-08 LAB — MAGNESIUM: Magnesium: 1.3 mg/dL — ABNORMAL LOW (ref 1.7–2.4)

## 2018-11-08 MED ORDER — POTASSIUM CHLORIDE CRYS ER 20 MEQ PO TBCR
40.0000 meq | EXTENDED_RELEASE_TABLET | Freq: Three times a day (TID) | ORAL | Status: DC
  Administered 2018-11-08 (×3): 40 meq via ORAL
  Filled 2018-11-08 (×3): qty 2

## 2018-11-08 MED ORDER — HYDRALAZINE HCL 20 MG/ML IJ SOLN
10.0000 mg | INTRAMUSCULAR | Status: DC | PRN
  Administered 2018-11-08: 10 mg via INTRAVENOUS
  Filled 2018-11-08: qty 1

## 2018-11-08 MED ORDER — POTASSIUM CHLORIDE 10 MEQ/100ML IV SOLN
10.0000 meq | INTRAVENOUS | Status: AC
  Administered 2018-11-08 (×2): 10 meq via INTRAVENOUS
  Filled 2018-11-08 (×2): qty 100

## 2018-11-08 MED ORDER — POTASSIUM CHLORIDE 10 MEQ/100ML IV SOLN
INTRAVENOUS | Status: AC
  Administered 2018-11-08: 10 meq
  Filled 2018-11-08: qty 100

## 2018-11-08 MED ORDER — POTASSIUM CHLORIDE CRYS ER 20 MEQ PO TBCR
40.0000 meq | EXTENDED_RELEASE_TABLET | Freq: Once | ORAL | Status: AC
  Administered 2018-11-08: 40 meq via ORAL
  Filled 2018-11-08: qty 2

## 2018-11-08 MED ORDER — MAGNESIUM SULFATE 2 GM/50ML IV SOLN
2.0000 g | Freq: Once | INTRAVENOUS | Status: AC
  Administered 2018-11-08: 2 g via INTRAVENOUS
  Filled 2018-11-08: qty 50

## 2018-11-08 NOTE — Progress Notes (Signed)
[facility] Progress Note  Patient: Daniel Bryant RDE:[id number]   [facility] DOB: Jan 23, 1961   DOA: 11/05/2018   DOS: 11/08/2018   Date of Service: the patient was seen and examined on 11/08/2018  Brief hospital course: Pt. with PMH of stage IV non-small cell lung cancer, systemic chemotherapy, asthma, COPD, GERD, HLD, HTN, OSA not on CPAP, S/P PPM, history of seizures not on any AED; admitted on 11/05/2018, presented with complaint of nausea vomiting and diarrhea, was found to have acute dehydration, hypokalemia likely due to viral gastroenteritis. Currently further plan is continue IV fluid and assistance continue to monitor on telemetry.  Subjective: Had 3 bowel movement so far loose and watery.  No blood in the stool.  No nausea no vomiting no active bleeding  Assessment and Plan: 1.  Acute gastroenteritis. Nausea and vomiting. Diarrhea. Dizziness Orthostatic hypotension Dehydration Acute kidney injury. Hypokalemia.  Hypomagnesemia No fever no leukocytosis.  Normal lipase.  Relatively normal LFTs. Improving with supportive care. We will continue with IV hydration continue with potassium supplementation. Also continue with magnesium supplementation. Suspect with the patient's GI loss with ongoing diarrhea is still significantly high to be replaced p.o. Continue to monitor in the hospital. Due to severe hypokalemia we will continue on telemetry monitoring as well. No significant abnormality on telemetry so far. Dizziness is resolved.  Orthostatics has resolved. Acute kidney injury also resolved.  2.  Stage IV non-small cell lung cancer. Diagnosed in January 2020. Currently systemic chemotherapy. Continue outpatient oncology follow-up.  3.  Asthma. COPD. OSA not on CPAP. DuoNebs as needed. No wheezing no active shortness of breath.  4.  HLD. Continue Lipitor.  5.  Hypertension. Patient is on lisinopril 5 mg. Currently on hold.   6.  S/P PPM.  Continue on telemetry  Diet: Regular diet DVT Prophylaxis: Subcutaneous Lovenox  Advance goals of care discussion: full code  Family Communication: no family was present at bedside, at the time of interview.   Disposition:  Discharge to Home .  Consultants: none Procedures: none  Scheduled Meds: . atorvastatin  40 mg Oral Daily  . escitalopram  20 mg Oral Daily  . feeding supplement (PRO-STAT SUGAR FREE 64)  30 mL Oral BID  . multivitamin with minerals  1 tablet Oral Daily  . pantoprazole  40 mg Oral Daily  . potassium chloride  40 mEq Oral TID   Continuous Infusions:  PRN Meds: acetaminophen, alum & mag hydroxide-simeth, diphenhydrAMINE, hydrALAZINE, ipratropium-albuterol, lidocaine, loperamide, prochlorperazine Antibiotics: Anti-infectives (From admission, onward)   None       Objective: Physical Exam: Vitals:   11/07/18 2307 11/08/18 0631 11/08/18 1343 11/08/18 1532  BP: (!) 157/103 (!) 157/93 (!) 160/117 136/89  Pulse: 78 77 79 86  Resp: 18 20 15    Temp: 97.9 F (36.6 C) 97.9 F (36.6 C) 98.2 F (36.8 C)   TempSrc: Oral Oral Oral   SpO2: 100% 100% 100%   Weight:      Height:        Intake/Output Summary (Last 24 hours) at 11/08/2018 1714 Last data filed at 11/08/2018 1400 Gross per 24 hour  Intake 2428.04 ml  Output 0 ml  Net 2428.04 ml   Filed Weights   11/05/18 1958 11/06/18 0120  Weight: 83.9 kg 91.2 kg   General: alert and oriented to time, place, and person. Appear in mild distress, affect appropriate Eyes: PERRL, Conjunctiva normal ENT: Oral Mucosa Clear, moist  Neck: no JVD, no Abnormal Mass Or lumps  Cardiovascular: S1 and S2 Present, no Murmur, peripheral pulses symmetrical Respiratory: normal respiratory effort, Bilateral Air entry equal and Decreased, no use of accessory muscle, Clear to Auscultation, no Crackles, no wheezes Abdomen: Bowel Sound present, Soft and no tenderness, no hernia Skin: no rashes  Extremities: no Pedal edema, no  calf tenderness Neurologic: normal without focal findings, mental status, speech normal, alert and oriented x3, PERLA, Motor strength 5/5 and symmetric and sensation grossly normal to light touch Gait not checked due to patient safety concerns  Data Reviewed: CBC: Recent Labs  Lab 11/05/18 1959 11/06/18 0407 11/07/18 0351 11/08/18 0328  WBC 6.3 6.8 5.6 8.5  NEUTROABS 4.5  --   --   --   HGB 10.8* 9.7* 7.8* 9.2*  HCT 28.6* 25.7* 21.5* 25.2*  MCV 91.4 92.1 93.9 95.1  PLT 225 204 207 943   Basic Metabolic Panel: Recent Labs  Lab 11/05/18 2100 11/06/18 0407 11/06/18 0959 11/06/18 1355 11/07/18 0351 11/07/18 1157 11/08/18 0328  NA 134* 132* 132* 133* 135 137 135  K <2.0* 2.2* 2.3* 2.3* 2.6* 2.8* 2.7*  CL 89* 88* 93* 93* 102 105 103  CO2 30 32 29 28 27 27  21*  GLUCOSE 111* 109* 114* 121* 94 98 95  BUN 18 18 18 17 11 10 7   CREATININE 1.53* 1.46* 1.42* 1.42* 1.26* 1.21 1.11  CALCIUM 7.1* 7.5* 7.4* 7.8* 7.4* 7.7* 7.9*  MG 1.1* 2.2  --   --   --   --  1.3*    Liver Function Tests: Recent Labs  Lab 11/05/18 2100 11/06/18 0407  AST 56* 50*  ALT 93* 82*  ALKPHOS 77 74  BILITOT 1.0 0.9  PROT 5.7* 5.6*  ALBUMIN 2.9* 2.9*   Recent Labs  Lab 11/05/18 2100 11/06/18 0407  LIPASE 83* 69*   No results for input(s): AMMONIA in the last 168 hours. Coagulation Profile: No results for input(s): INR, PROTIME in the last 168 hours. Cardiac Enzymes: No results for input(s): CKTOTAL, CKMB, CKMBINDEX, TROPONINI in the last 168 hours. BNP (last 3 results) No results for input(s): PROBNP in the last 8760 hours. CBG: No results for input(s): GLUCAP in the last 168 hours. Studies: No results found.   Time spent: 35 minutes  Author: Berle Mull, MD [facility] 11/08/2018 5:14 PM  To reach On-call, see care teams to locate the attending and reach out to them via [email or web address]. If 7PM-7AM, please contact night-coverage If you still have difficulty reaching the  attending provider, please page the [facility] (Director on Call) for [facility] on amion for assistance.

## 2018-11-08 NOTE — Plan of Care (Addendum)
Pt still experiencing diarrhea.  Pt only had 2 BM's this shift.

## 2018-11-08 NOTE — Progress Notes (Signed)
CRITICAL VALUE ALERT  Critical Value:  K 2.4  Date & Time Notied:  11/08/18  Provider Notified: Lennox Grumbles, NP  Orders Received/Actions taken: Awaiting new orders.

## 2018-11-08 NOTE — Plan of Care (Signed)
  Problem: Education: Goal: Knowledge of General Education information will improve Description: Including pain rating scale, medication(s)/side effects and non-pharmacologic comfort measures Outcome: Completed/Met   Problem: Health Behavior/Discharge Planning: Goal: Ability to manage health-related needs will improve Outcome: Progressing   Problem: Clinical Measurements: Goal: Ability to maintain clinical measurements within normal limits will improve Outcome: Not Progressing Goal: Will remain free from infection Outcome: Not Progressing Goal: Diagnostic test results will improve Outcome: Completed/Met Goal: Respiratory complications will improve Outcome: Completed/Met Goal: Cardiovascular complication will be avoided Outcome: Completed/Met   Problem: Activity: Goal: Risk for activity intolerance will decrease Outcome: Progressing   Problem: Nutrition: Goal: Adequate nutrition will be maintained Outcome: Progressing   Problem: Coping: Goal: Level of anxiety will decrease Outcome: Completed/Met   Problem: Elimination: Goal: Will not experience complications related to bowel motility Outcome: Not Progressing Goal: Will not experience complications related to urinary retention Outcome: Completed/Met   Problem: Pain Managment: Goal: General experience of comfort will improve Outcome: Completed/Met   Problem: Safety: Goal: Ability to remain free from injury will improve Outcome: Completed/Met   Problem: Skin Integrity: Goal: Risk for impaired skin integrity will decrease Outcome: Completed/Met

## 2018-11-09 LAB — BASIC METABOLIC PANEL
Anion gap: 4 — ABNORMAL LOW (ref 5–15)
Anion gap: 7 (ref 5–15)
BUN: 7 mg/dL (ref 6–20)
BUN: 7 mg/dL (ref 6–20)
CO2: 22 mmol/L (ref 22–32)
CO2: 22 mmol/L (ref 22–32)
Calcium: 7.8 mg/dL — ABNORMAL LOW (ref 8.9–10.3)
Calcium: 7.9 mg/dL — ABNORMAL LOW (ref 8.9–10.3)
Chloride: 109 mmol/L (ref 98–111)
Chloride: 105 mmol/L (ref 98–111)
Creatinine, Ser: 1.17 mg/dL (ref 0.61–1.24)
Creatinine, Ser: 1.16 mg/dL (ref 0.61–1.24)
GFR calc Af Amer: >60 mL/min (ref >60)
GFR calc Af Amer: >60 mL/min (ref >60)
GFR calc non Af Amer: >60 mL/min (ref >60)
GFR calc non Af Amer: >60 mL/min (ref >60)
Glucose, Bld: 92 mg/dL (ref 70–99)
Glucose, Bld: 111 mg/dL — ABNORMAL HIGH (ref 70–99)
Potassium: 3.6 mmol/L (ref 3.5–5.1)
Potassium: 3.2 mmol/L — ABNORMAL LOW (ref 3.5–5.1)
Sodium: 135 mmol/L (ref 135–145)
Sodium: 134 mmol/L — ABNORMAL LOW (ref 135–145)

## 2018-11-09 LAB — MAGNESIUM: Magnesium: 1.4 mg/dL — ABNORMAL LOW (ref 1.7–2.4)

## 2018-11-09 MED ORDER — POTASSIUM CHLORIDE CRYS ER 20 MEQ PO TBCR
40.0000 meq | EXTENDED_RELEASE_TABLET | Freq: Two times a day (BID) | ORAL | Status: DC
  Administered 2018-11-09 (×2): 40 meq via ORAL
  Filled 2018-11-09 (×2): qty 2

## 2018-11-09 MED ORDER — DIPHENOXYLATE-ATROPINE 2.5-0.025 MG PO TABS: 1.0000 | ORAL_TABLET | Freq: Four times a day (QID) | ORAL | Status: DC | PRN

## 2018-11-09 MED ORDER — SODIUM CHLORIDE 0.9 % IV SOLN
INTRAVENOUS | Status: DC | PRN
  Administered 2018-11-09: 250 mL via INTRAVENOUS

## 2018-11-09 MED ORDER — MAGNESIUM SULFATE 4 GM/100ML IV SOLN
4.0000 g | Freq: Once | INTRAVENOUS | Status: AC
  Administered 2018-11-09: 4 g via INTRAVENOUS
  Filled 2018-11-09: qty 100

## 2018-11-09 MED ORDER — CHOLESTYRAMINE 4 G PO PACK
4.0000 g | PACK | Freq: Two times a day (BID) | ORAL | Status: DC
  Administered 2018-11-09 – 2018-11-12 (×7): 4 g via ORAL
  Filled 2018-11-09 (×7): qty 1

## 2018-11-09 NOTE — Progress Notes (Signed)
[facility] Progress Note  Patient: Daniel Bryant PIR:[id number]   [facility] DOB: November 29, 1960   DOA: 11/05/2018   DOS: 11/09/2018   Date of Service: the patient was seen and examined on 11/09/2018  Brief hospital course: Pt. with PMH of stage IV non-small cell lung cancer, systemic chemotherapy, asthma, COPD, GERD, HLD, HTN, OSA not on CPAP, S/P PPM, history of seizures not on any AED; admitted on 11/05/2018, presented with complaint of nausea vomiting and diarrhea, was found to have acute dehydration, hypokalemia likely due to viral gastroenteritis. Currently further plan is continue IV fluid and assistance continue to monitor on telemetry.  Subjective: Continues to have loose BM.  No nausea no vomiting no fever no chills.  Assessment and Plan: 1.  Acute gastroenteritis. Nausea and vomiting. Diarrhea. Dizziness Orthostatic hypotension Dehydration Acute kidney injury. Hypokalemia.  Hypomagnesemia No fever no leukocytosis.  Normal lipase.  Relatively normal LFTs. Improving with supportive care. We will continue with IV hydration continue with potassium supplementation. Also continue with magnesium supplementation. Suspect with the patient's GI loss with ongoing diarrhea is still significantly high to be replaced p.o. Continue potassium replacement. Patient was on Imodium without any benefit.  Will transition to Lomotil as needed. Add Questran as well. No significant abnormality on telemetry so far. Dizziness is resolved.  Orthostatics has resolved. Acute kidney injury also resolved.  2.  Stage IV non-small cell lung cancer. Diagnosed in January 2020. Currently systemic chemotherapy. Continue outpatient oncology follow-up.  3.  Asthma. COPD. OSA not on CPAP. DuoNebs as needed. No wheezing no active shortness of breath.  4.  HLD. Continue Lipitor.  5.  Hypertension. Patient is on lisinopril 5 mg. Currently on hold.   6.  S/P PPM. Continue on  telemetry  Diet: Regular diet DVT Prophylaxis: Subcutaneous Lovenox  Advance goals of care discussion: full code  Family Communication: no family was present at bedside, at the time of interview.   Disposition:  Discharge to Home .  Consultants: none Procedures: none  Scheduled Meds: . atorvastatin  40 mg Oral Daily  . cholestyramine  4 g Oral BID  . escitalopram  20 mg Oral Daily  . feeding supplement (PRO-STAT SUGAR FREE 64)  30 mL Oral BID  . multivitamin with minerals  1 tablet Oral Daily  . pantoprazole  40 mg Oral Daily  . potassium chloride  40 mEq Oral BID   Continuous Infusions: . sodium chloride 250 mL (11/09/18 1243)   PRN Meds: sodium chloride, acetaminophen, alum & mag hydroxide-simeth, diphenhydrAMINE, hydrALAZINE, ipratropium-albuterol, lidocaine, loperamide, prochlorperazine Antibiotics: Anti-infectives (From admission, onward)   None       Objective: Physical Exam: Vitals:   11/08/18 1532 11/08/18 2212 11/09/18 0656 11/09/18 1603  BP: 136/89 (!) 144/83 (!) 135/91 (!) 165/98  Pulse: 86 79 78 80  Resp:  16 16 16   Temp:  98.6 F (37 C) 98.3 F (36.8 C)   TempSrc:  Oral Oral   SpO2:  100% 99% 100%  Weight:      Height:        Intake/Output Summary (Last 24 hours) at 11/09/2018 1814 Last data filed at 11/09/2018 1400 Gross per 24 hour  Intake 1140.15 ml  Output -  Net 1140.15 ml   Filed Weights   11/05/18 1958 11/06/18 0120  Weight: 83.9 kg 91.2 kg   General: alert and oriented to time, place, and person. Appear in mild distress, affect appropriate Eyes: PERRL, Conjunctiva normal ENT: Oral Mucosa Clear, moist  Neck: no JVD, no Abnormal Mass Or lumps Cardiovascular: S1 and S2 Present, no Murmur, peripheral pulses symmetrical Respiratory: normal respiratory effort, Bilateral Air entry equal and Decreased, no use of accessory muscle, Clear to Auscultation, no Crackles, no wheezes Abdomen: Bowel Sound present, Soft and no tenderness, no hernia  Skin: no rashes  Extremities: no Pedal edema, no calf tenderness Neurologic: normal without focal findings, mental status, speech normal, alert and oriented x3, PERLA, Motor strength 5/5 and symmetric and sensation grossly normal to light touch Gait not checked due to patient safety concerns  Data Reviewed: CBC: Recent Labs  Lab 11/05/18 1959 11/06/18 0407 11/07/18 0351 11/08/18 0328  WBC 6.3 6.8 5.6 8.5  NEUTROABS 4.5  --   --   --   HGB 10.8* 9.7* 7.8* 9.2*  HCT 28.6* 25.7* 21.5* 25.2*  MCV 91.4 92.1 93.9 95.1  PLT 225 204 207 790   Basic Metabolic Panel: Recent Labs  Lab 11/05/18 2100 11/06/18 0407  11/07/18 1157 11/08/18 0328 11/08/18 1811 11/09/18 0507 11/09/18 1636  NA 134* 132*   < > 137 135 137 135 134*  K <2.0* 2.2*   < > 2.8* 2.7* 3.1* 3.6 3.2*  CL 89* 88*   < > 105 103 108 109 105  CO2 30 32   < > 27 21* 23 22 22   GLUCOSE 111* 109*   < > 98 95 100* 92 111*  BUN 18 18   < > 10 7 7 7 7   CREATININE 1.53* 1.46*   < > 1.21 1.11 1.11 1.17 1.16  CALCIUM 7.1* 7.5*   < > 7.7* 7.9* 8.2* 7.8* 7.9*  MG 1.1* 2.2  --   --  1.3*  --  1.4*  --    < > = values in this interval not displayed.    Liver Function Tests: Recent Labs  Lab 11/05/18 2100 11/06/18 0407  AST 56* 50*  ALT 93* 82*  ALKPHOS 77 74  BILITOT 1.0 0.9  PROT 5.7* 5.6*  ALBUMIN 2.9* 2.9*   Recent Labs  Lab 11/05/18 2100 11/06/18 0407  LIPASE 83* 69*   No results for input(s): AMMONIA in the last 168 hours. Coagulation Profile: No results for input(s): INR, PROTIME in the last 168 hours. Cardiac Enzymes: No results for input(s): CKTOTAL, CKMB, CKMBINDEX, TROPONINI in the last 168 hours. BNP (last 3 results) No results for input(s): PROBNP in the last 8760 hours. CBG: No results for input(s): GLUCAP in the last 168 hours. Studies: No results found.   Time spent: 35 minutes  Author: Berle Mull, MD [facility] 11/09/2018 6:14 PM  To reach On-call, see care teams to locate the  attending and reach out to them via [email or web address]. If 7PM-7AM, please contact night-coverage If you still have difficulty reaching the attending provider, please page the [facility] (Director on Call) for [facility] on amion for assistance.

## 2018-11-10 LAB — BASIC METABOLIC PANEL
Anion gap: 6 (ref 5–15)
Anion gap: 9 (ref 5–15)
BUN: 7 mg/dL (ref 6–20)
BUN: 7 mg/dL (ref 6–20)
CO2: 23 mmol/L (ref 22–32)
CO2: 24 mmol/L (ref 22–32)
Calcium: 7.8 mg/dL — ABNORMAL LOW (ref 8.9–10.3)
Calcium: 7.9 mg/dL — ABNORMAL LOW (ref 8.9–10.3)
Chloride: 106 mmol/L (ref 98–111)
Chloride: 104 mmol/L (ref 98–111)
Creatinine, Ser: 1.03 mg/dL (ref 0.61–1.24)
Creatinine, Ser: 1 mg/dL (ref 0.61–1.24)
GFR calc Af Amer: >60 mL/min (ref >60)
GFR calc Af Amer: >60 mL/min (ref >60)
GFR calc non Af Amer: >60 mL/min (ref >60)
GFR calc non Af Amer: >60 mL/min (ref >60)
Glucose, Bld: 96 mg/dL (ref 70–99)
Glucose, Bld: 105 mg/dL — ABNORMAL HIGH (ref 70–99)
Potassium: 3.2 mmol/L — ABNORMAL LOW (ref 3.5–5.1)
Potassium: 3 mmol/L — ABNORMAL LOW (ref 3.5–5.1)
Sodium: 135 mmol/L (ref 135–145)
Sodium: 137 mmol/L (ref 135–145)

## 2018-11-10 LAB — MAGNESIUM: Magnesium: 1.6 mg/dL — ABNORMAL LOW (ref 1.7–2.4)

## 2018-11-10 MED ORDER — DIPHENOXYLATE-ATROPINE 2.5-0.025 MG PO TABS
1.0000 | ORAL_TABLET | Freq: Two times a day (BID) | ORAL | Status: DC
  Administered 2018-11-10 – 2018-11-12 (×5): 1 via ORAL
  Filled 2018-11-10 (×5): qty 1

## 2018-11-10 MED ORDER — MAGNESIUM SULFATE 4 GM/100ML IV SOLN
4.0000 g | Freq: Once | INTRAVENOUS | Status: AC
  Administered 2018-11-10: 4 g via INTRAVENOUS
  Filled 2018-11-10: qty 100

## 2018-11-10 MED ORDER — DIPHENOXYLATE-ATROPINE 2.5-0.025 MG PO TABS
1.0000 | ORAL_TABLET | Freq: Two times a day (BID) | ORAL | Status: DC | PRN
  Administered 2018-11-10 – 2018-11-11 (×2): 1 via ORAL
  Filled 2018-11-10 (×2): qty 1

## 2018-11-10 MED ORDER — POTASSIUM CHLORIDE CRYS ER 20 MEQ PO TBCR
40.0000 meq | EXTENDED_RELEASE_TABLET | Freq: Three times a day (TID) | ORAL | Status: DC
  Administered 2018-11-10 – 2018-11-12 (×7): 40 meq via ORAL
  Filled 2018-11-10 (×7): qty 2

## 2018-11-10 NOTE — Progress Notes (Signed)
Per patient he has had a total of 3 watery "discharge" stools. Requested prn Lomotil. Appetite good and otherwise tolerating diet. Voicing no other c/o. Eulas Post, RN

## 2018-11-10 NOTE — Progress Notes (Signed)
[facility] Progress Note  Patient: Daniel Bryant [id number]:[id number]   PCP[facility] DOB: Jul 25, 1960   DOA: 11/05/2018   DOS: 11/10/2018   Date of Service: the patient was seen and examined on 11/10/2018  Brief hospital course: Pt. with PMH of stage IV non-small cell lung cancer, systemic chemotherapy, asthma, COPD, GERD, HLD, HTN, OSA not on CPAP, S/P PPM, history of seizures not on any AED; admitted on 11/05/2018, presented with complaint of nausea vomiting and diarrhea, was found to have acute dehydration, hypokalemia likely due to viral gastroenteritis. Currently further plan is continue IV fluid and assistance continue to monitor on telemetry.  Subjective: Continues to report diarrhea.  No nausea no vomiting.  Did not have any Lomotil yesterday.  Assessment and Plan: 1.  Acute gastroenteritis. Nausea and vomiting. Diarrhea. Dizziness Orthostatic hypotension Dehydration Acute kidney injury. Hypokalemia.  Hypomagnesemia No fever no leukocytosis.  Normal lipase.  Relatively normal LFTs. Improving with supportive care. We will continue with IV hydration continue with potassium supplementation. Also continue with magnesium supplementation. Suspect with the patient's GI loss with ongoing diarrhea is still significantly high to be replaced p.o. Continue potassium replacement. Patient was on Imodium without any benefit.  Will transition to Lomotil as needed. Add Questran as well.  Scheduled Lomotil. No significant abnormality on telemetry so far. Dizziness is resolved.  Orthostatics has resolved. Acute kidney injury also resolved.  2.  Stage IV non-small cell lung cancer. Diagnosed in January 2020. Currently systemic chemotherapy. Continue outpatient oncology follow-up.  3.  Asthma. COPD. OSA not on CPAP. DuoNebs as needed. No wheezing no active shortness of breath.  4.  HLD. Continue Lipitor.  5.  Hypertension. Patient is on lisinopril 5 mg. Currently on  hold.   6.  S/P PPM. Continue on telemetry  Diet: Regular diet DVT Prophylaxis: Subcutaneous Lovenox  Advance goals of care discussion: full code  Family Communication: no family was present at bedside, at the time of interview.   Disposition:  Discharge to Home .  Consultants: none Procedures: none  Scheduled Meds: . atorvastatin  40 mg Oral Daily  . cholestyramine  4 g Oral BID  . diphenoxylate-atropine  1 tablet Oral BID  . escitalopram  20 mg Oral Daily  . feeding supplement (PRO-STAT SUGAR FREE 64)  30 mL Oral BID  . multivitamin with minerals  1 tablet Oral Daily  . pantoprazole  40 mg Oral Daily  . potassium chloride  40 mEq Oral TID   Continuous Infusions: . sodium chloride 250 mL (11/09/18 1243)   PRN Meds: sodium chloride, acetaminophen, alum & mag hydroxide-simeth, diphenhydrAMINE, diphenoxylate-atropine, hydrALAZINE, ipratropium-albuterol, lidocaine, prochlorperazine Antibiotics: Anti-infectives (From admission, onward)   None       Objective: Physical Exam: Vitals:   11/09/18 1603 11/09/18 2148 11/10/18 0621 11/10/18 1450  BP: (!) 165/98 132/73 129/85 133/86  Pulse: 80 82 75 76  Resp: 16 16 18 16   Temp:  99 F (37.2 C) 98.5 F (36.9 C) 98 F (36.7 C)  TempSrc:  Oral Oral Oral  SpO2: 100% 100% 98% 99%  Weight:      Height:        Intake/Output Summary (Last 24 hours) at 11/10/2018 1903 Last data filed at 11/10/2018 1300 Gross per 24 hour  Intake 760 ml  Output -  Net 760 ml   Filed Weights   11/05/18 1958 11/06/18 0120  Weight: 83.9 kg 91.2 kg   General: alert and oriented to time, place, and person. Appear in  mild distress, affect appropriate Eyes: PERRL, Conjunctiva normal ENT: Oral Mucosa Clear, moist  Neck: no JVD, no Abnormal Mass Or lumps Cardiovascular: S1 and S2 Present, no Murmur, peripheral pulses symmetrical Respiratory: normal respiratory effort, Bilateral Air entry equal and Decreased, no use of accessory muscle, Clear to  Auscultation, no Crackles, no wheezes Abdomen: Bowel Sound present, Soft and no tenderness, no hernia Skin: no rashes  Extremities: no Pedal edema, no calf tenderness Neurologic: normal without focal findings, mental status, speech normal, alert and oriented x3, PERLA, Motor strength 5/5 and symmetric and sensation grossly normal to light touch Gait not checked due to patient safety concerns  Data Reviewed: CBC: Recent Labs  Lab 11/05/18 1959 11/06/18 0407 11/07/18 0351 11/08/18 0328  WBC 6.3 6.8 5.6 8.5  NEUTROABS 4.5  --   --   --   HGB 10.8* 9.7* 7.8* 9.2*  HCT 28.6* 25.7* 21.5* 25.2*  MCV 91.4 92.1 93.9 95.1  PLT 225 204 207 443   Basic Metabolic Panel: Recent Labs  Lab 11/05/18 2100 11/06/18 0407  11/08/18 0328 11/08/18 1811 11/09/18 0507 11/09/18 1636 11/10/18 0352 11/10/18 1655  NA 134* 132*   < > 135 137 135 134* 135 137  K <2.0* 2.2*   < > 2.7* 3.1* 3.6 3.2* 3.2* 3.0*  CL 89* 88*   < > 103 108 109 105 106 104  CO2 30 32   < > 21* 23 22 22 23 24   GLUCOSE 111* 109*   < > 95 100* 92 111* 96 105*  BUN 18 18   < > 7 7 7 7 7 7   CREATININE 1.53* 1.46*   < > 1.11 1.11 1.17 1.16 1.03 1.00  CALCIUM 7.1* 7.5*   < > 7.9* 8.2* 7.8* 7.9* 7.8* 7.9*  MG 1.1* 2.2  --  1.3*  --  1.4*  --  1.6*  --    < > = values in this interval not displayed.    Liver Function Tests: Recent Labs  Lab 11/05/18 2100 11/06/18 0407  AST 56* 50*  ALT 93* 82*  ALKPHOS 77 74  BILITOT 1.0 0.9  PROT 5.7* 5.6*  ALBUMIN 2.9* 2.9*   Recent Labs  Lab 11/05/18 2100 11/06/18 0407  LIPASE 83* 69*   No results for input(s): AMMONIA in the last 168 hours. Coagulation Profile: No results for input(s): INR, PROTIME in the last 168 hours. Cardiac Enzymes: No results for input(s): CKTOTAL, CKMB, CKMBINDEX, TROPONINI in the last 168 hours. BNP (last 3 results) No results for input(s): PROBNP in the last 8760 hours. CBG: No results for input(s): GLUCAP in the last 168 hours. Studies: No  results found.   Time spent: 35 minutes  Author: Berle Mull, MD [facility] 11/10/2018 7:03 PM  To reach On-call, see care teams to locate the attending and reach out to them via [email or web address]. If 7PM-7AM, please contact night-coverage If you still have difficulty reaching the attending provider, please page the [facility] (Director on Call) for [facility] on amion for assistance.

## 2018-11-11 LAB — BASIC METABOLIC PANEL
Anion gap: 9 (ref 5–15)
BUN: 9 mg/dL (ref 6–20)
CO2: 24 mmol/L (ref 22–32)
Calcium: 8.1 mg/dL — ABNORMAL LOW (ref 8.9–10.3)
Chloride: 103 mmol/L (ref 98–111)
Creatinine, Ser: 1.07 mg/dL (ref 0.61–1.24)
GFR calc Af Amer: >60 mL/min (ref >60)
GFR calc non Af Amer: >60 mL/min (ref >60)
Glucose, Bld: 99 mg/dL (ref 70–99)
Potassium: 3.4 mmol/L — ABNORMAL LOW (ref 3.5–5.1)
Sodium: 136 mmol/L (ref 135–145)

## 2018-11-11 LAB — MAGNESIUM
Magnesium: 1.5 mg/dL — ABNORMAL LOW (ref 1.7–2.4)
Magnesium: 2.2 mg/dL (ref 1.7–2.4)

## 2018-11-11 MED ORDER — FAMOTIDINE 20 MG PO TABS
20.0000 mg | ORAL_TABLET | Freq: Every day | ORAL | Status: DC
  Administered 2018-11-11 – 2018-11-12 (×2): 20 mg via ORAL
  Filled 2018-11-11 (×2): qty 1

## 2018-11-11 MED ORDER — MAGNESIUM SULFATE 4 GM/100ML IV SOLN
4.0000 g | Freq: Once | INTRAVENOUS | Status: AC
  Administered 2018-11-11: 4 g via INTRAVENOUS
  Filled 2018-11-11: qty 100

## 2018-11-11 NOTE — Plan of Care (Signed)
Bowel movements are beginning to be more formed.

## 2018-11-12 ENCOUNTER — Telehealth: Payer: Self-pay | Admitting: Internal Medicine

## 2018-11-12 ENCOUNTER — Telehealth: Payer: Self-pay | Admitting: *Deleted

## 2018-11-12 DIAGNOSIS — R197 Diarrhea, unspecified: Secondary | ICD-10-CM

## 2018-11-12 DIAGNOSIS — C3491 Malignant neoplasm of unspecified part of right bronchus or lung: Secondary | ICD-10-CM

## 2018-11-12 LAB — BASIC METABOLIC PANEL
Anion gap: 8 (ref 5–15)
BUN: 10 mg/dL (ref 6–20)
CO2: 24 mmol/L (ref 22–32)
Calcium: 8 mg/dL — ABNORMAL LOW (ref 8.9–10.3)
Chloride: 103 mmol/L (ref 98–111)
Creatinine, Ser: 1.13 mg/dL (ref 0.61–1.24)
GFR calc Af Amer: >60 mL/min (ref >60)
GFR calc non Af Amer: >60 mL/min (ref >60)
Glucose, Bld: 86 mg/dL (ref 70–99)
Potassium: 3.9 mmol/L (ref 3.5–5.1)
Sodium: 135 mmol/L (ref 135–145)

## 2018-11-12 LAB — MAGNESIUM: Magnesium: 1.6 mg/dL — ABNORMAL LOW (ref 1.7–2.4)

## 2018-11-12 MED ORDER — POTASSIUM CHLORIDE CRYS ER 20 MEQ PO TBCR: 40.0000 meq | EXTENDED_RELEASE_TABLET | Freq: Two times a day (BID) | ORAL | 0 refills | Status: DC

## 2018-11-12 MED ORDER — MAGNESIUM CHLORIDE 64 MG PO TBEC: 1.0000 | DELAYED_RELEASE_TABLET | Freq: Every day | ORAL | 0 refills | Status: DC

## 2018-11-12 MED ORDER — CHOLESTYRAMINE 4 G PO PACK: 4.0000 g | PACK | Freq: Two times a day (BID) | ORAL | 0 refills | Status: DC

## 2018-11-12 MED ORDER — MAGNESIUM CHLORIDE 64 MG PO TBEC: 1.0000 | DELAYED_RELEASE_TABLET | Freq: Every day | ORAL | 0 refills | Status: AC

## 2018-11-12 MED ORDER — FAMOTIDINE 20 MG PO TABS: 20.0000 mg | ORAL_TABLET | Freq: Every day | ORAL | 0 refills | Status: AC

## 2018-11-12 MED ORDER — PRO-STAT SUGAR FREE PO LIQD: 30.0000 mL | Freq: Two times a day (BID) | ORAL | 0 refills | Status: AC

## 2018-11-12 MED ORDER — ADULT MULTIVITAMIN W/MINERALS CH: 1.0000 | ORAL_TABLET | Freq: Every day | ORAL | 0 refills | Status: AC

## 2018-11-12 MED ORDER — PRO-STAT SUGAR FREE PO LIQD: 30.0000 mL | Freq: Two times a day (BID) | ORAL | 0 refills | Status: DC

## 2018-11-12 MED ORDER — MAGNESIUM CHLORIDE 64 MG PO TBEC
1.0000 | DELAYED_RELEASE_TABLET | Freq: Two times a day (BID) | ORAL | Status: DC
  Administered 2018-11-12: 64 mg via ORAL
  Filled 2018-11-12 (×2): qty 1

## 2018-11-12 MED ORDER — FAMOTIDINE 20 MG PO TABS: 20.0000 mg | ORAL_TABLET | Freq: Every day | ORAL | 0 refills | Status: DC

## 2018-11-12 MED ORDER — DIPHENOXYLATE-ATROPINE 2.5-0.025 MG PO TABS: 1.0000 | ORAL_TABLET | Freq: Four times a day (QID) | ORAL | 0 refills | Status: DC | PRN

## 2018-11-12 MED FILL — POTASSIUM CHLORIDE CRYS ER: 20 | 5 days supply | Qty: 20 | Fill #0

## 2018-11-12 MED FILL — CHOLESTYRAMINE PACKET: 4 | 5 days supply | Qty: 10 | Fill #0

## 2018-11-12 MED FILL — FAMOTIDINE 20 MG TABS: 20 | 30 days supply | Qty: 30 | Fill #0

## 2018-11-12 MED FILL — DIPHENOXYLATE-ATROPINE 2.5-: 2.5-0.025 | 8 days supply | Qty: 30 | Fill #0

## 2018-11-12 NOTE — Telephone Encounter (Signed)
Confirmed 9/3 lab with patient. Also called back and left voicemail re lab date/time per patient request.

## 2018-11-12 NOTE — Progress Notes (Signed)
Pt given discharge instructions and all questions answered. Given taxi voucher for ride home as well.

## 2018-11-12 NOTE — TOC Transition Note (Signed)
Transition of Care [facility]) - CM/SW Discharge Note   Patient Details  Name: Daniel Bryant MRN: [id number] Date of Birth: 09/17/1960  [facility] CM/SW Contact:  Wende Neighbors, LCSW Phone Number: 11/12/2018, 10:34 AM   Clinical Narrative:   Patient stated he needed assistance going home. Patient agreeable with taxi voucher and stated he has access to his home. Voucher given to RN to call once patient dress and ready for discharge           Patient Goals and CMS Choice        Discharge Placement                       Discharge Plan and Services                                     Social Determinants of Health (SDOH) Interventions     Readmission Risk Interventions No flowsheet data found.

## 2018-11-12 NOTE — Telephone Encounter (Signed)
"  Dr. Posey Pronto calling about mutual patient in need of lab appointment 11-15-2018."  Verbal order received and read back from Dr, Julien Nordmann  for [facility] which includes potassium and magnesium lab order for Thursday. Order information given to [facility] at this time.  [place] scheduler message sent.

## 2018-11-14 ENCOUNTER — Ambulatory Visit: Payer: No Typology Code available for payment source | Admitting: Cardiothoracic Surgery

## 2018-11-15 ENCOUNTER — Encounter: Payer: Self-pay | Admitting: Cardiothoracic Surgery

## 2018-11-15 ENCOUNTER — Inpatient Hospital Stay: Payer: No Typology Code available for payment source | Attending: Internal Medicine

## 2018-11-15 NOTE — Discharge Summary (Signed)
[facility] Discharge Summary   Patient: Daniel Bryant EXN:[id number]   PCP: [facility] DOB: 04-Jul-1960   Date of admission: 11/05/2018   Date of discharge: 11/12/2018     Discharge Diagnoses:   Principal Problem:   Viral gastroenteritis Active Problems:   Hypokalemia   Orthostatic hypotension   Hypomagnesemia   AKI (acute kidney injury) ([place])   Diarrhea   Admitted From: Home Disposition:  Home   Recommendations for Outpatient Follow-up:  1. Please follow-up with PCP in 1 week. 2. Patient will require a repeat potassium and magnesium level checked. 3. Please stop taking PPI.  Follow-up Information    Clinic, [place] [facility]. Schedule an appointment as soon as possible for a visit in 1 week(s).   Why: needs a BMP for potassium check on Thursday 11/15/2018. Contact information: [place] [phone] [facility]. Call in 1 day(s).   Specialty: Oncology Why: for lab appointment Contact information: [place] [phone] [place] [phone]         Diet recommendation: Regular diet  Activity: The patient is advised to gradually reintroduce usual activities,as tolerated .  Discharge Condition: good  Code Status: Full code  History of present illness: As per the H and P dictated on admission, "Daniel Bryant is a 58 y.o. male with medical history significant of stage IV non-small cell lung cancer diagnosed in January 2020 currently on systemic chemotherapy, asthma, COPD, GERD, hyperlipidemia, hypertension, OSA not on CPAP, sick sinus syndrome status post PPM, history of petit mal seizures currently not on antiepileptics presenting to the hospital via EMS for evaluation of generalized weakness, dizziness, vomiting, and diarrhea. Pt was orthostatic with EMS, and received a 545ml NS bolus PTA.  Patient states for the past  2 days he is having continuous nausea, vomiting, and diarrhea.  Reports having nonbloody nonbilious emesis and diarrhea which is mostly light brown but sometimes "coffee ground" in appearance.  He is having mild bilateral lower quadrant abdominal discomfort.  He has not been able to tolerate any p.o. intake.  He has been feeling very lightheaded when he tries to get up or walk.  No headaches or focal weakness/numbness.  He is not sure if he has been having any fevers.  Denies any recent sick contacts.  No chest pain.  No other complaints."  Hospital Course:  Summary of his active problems in the hospital is as following. 1.  Acute gastroenteritis. Nausea and vomiting. Diarrhea. Dizziness Orthostatic hypotension Dehydration Acute kidney injury. Hypokalemia.  Hypomagnesemia No fever no leukocytosis.  Normal lipase.  Relatively normal LFTs. Improving with supportive care. We will continue with IV hydration continue with potassium supplementation. Also continue with magnesium supplementation. Suspect with the patient's GI loss with ongoing diarrhea is still significantly high to be replaced p.o. Continue potassium replacement. Patient was on Imodium without any benefit.  Will transition to Lomotil as needed. Add Questran as well.  Scheduled Lomotil. No significant abnormality on telemetry so far. Dizziness is resolved.  Orthostatics has resolved. Acute kidney injury also resolved.  2.  Stage IV non-small cell lung cancer. Diagnosed in January 2020. Currently systemic chemotherapy. Continue outpatient oncology follow-up.  3.  Asthma. COPD. OSA not on CPAP. DuoNebs as needed. No wheezing no active shortness of breath.  4.  HLD. Continue Lipitor.  5.  Hypertension. Patient is on lisinopril 5 mg. Currently on hold.  6.  S/P PPM. Continue on telemetry  Patient was ambulatory without any assistance. On the day of the discharge the patient's vitals were stable, and no other  acute medical condition were reported by patient. the patient was felt safe to be discharge at Home with no therapy needed on discharge.  Consultants: none Procedures: none  DISCHARGE MEDICATION: Allergies as of 11/12/2018      Reactions   Other Other (See Comments)   Patient states "I had a scrotal procedure a couple of years ago, there was something they gave me to relax me and I started freaking out and seeing things".  Unable to ascertain from pt or his records what medication was.      Medication List    STOP taking these medications   pantoprazole 40 MG tablet Commonly known as: PROTONIX     TAKE these medications   albuterol (2.5 MG/3ML) 0.083% nebulizer solution Commonly known as: PROVENTIL Take 3 mLs (2.5 mg total) by nebulization 2 (two) times daily as needed for wheezing or shortness of breath.   aspirin 81 MG EC tablet Take 1 tablet (81 mg total) by mouth daily.   atorvastatin 40 MG tablet Commonly known as: LIPITOR Take 40 mg by mouth daily.   cholestyramine 4 g packet Commonly known as: QUESTRAN Take 1 packet (4 g total) by mouth 2 (two) times daily.   diphenhydrAMINE 25 MG tablet Commonly known as: BENADRYL Take 1 tablet (25 mg total) by mouth every 6 (six) hours as needed for itching or allergies.   diphenoxylate-atropine 2.5-0.025 MG tablet Commonly known as: LOMOTIL Take 1 tablet by mouth 4 (four) times daily as needed for diarrhea or loose stools.   escitalopram 20 MG tablet Commonly known as: LEXAPRO Take 20 mg by mouth daily.   famotidine 20 MG tablet Commonly known as: PEPCID Take 1 tablet (20 mg total) by mouth daily.   feeding supplement (PRO-STAT SUGAR FREE 64) Liqd Take 30 mLs by mouth 2 (two) times daily.   folic acid 1 MG tablet Commonly known as: FOLVITE Take 1 tablet (1 mg total) by mouth daily.   lidocaine 2 % solution Commonly known as: XYLOCAINE Use as directed 5 mLs in the mouth or throat every 3 (three) hours as needed for  mouth pain. Gargle and spit   lisinopril 5 MG tablet Commonly known as: ZESTRIL Take 5 mg by mouth daily.   magnesium chloride 64 MG Tbec SR tablet Commonly known as: SLOW-MAG Take 1 tablet (64 mg total) by mouth daily for 10 days.   multivitamin with minerals Tabs tablet Take 1 tablet by mouth daily.   potassium chloride SA 20 MEQ tablet Commonly known as: K-DUR Take 2 tablets (40 mEq total) by mouth 2 (two) times daily for 5 days. What changed:   how much to take  when to take this   prochlorperazine 10 MG tablet Commonly known as: COMPAZINE Take 1 tablet (10 mg total) by mouth every 6 (six) hours as needed for nausea or vomiting.      Allergies  Allergen Reactions   Other Other (See Comments)    Patient states "I had a scrotal procedure a couple of years ago, there was something they gave me to relax me and I started freaking out and seeing things".  Unable to ascertain from pt or his records what medication was.   Discharge Instructions    Diet - low sodium heart healthy   Complete by: As directed    Discharge  instructions   Complete by: As directed    It is important that you read the given instructions as well as go over your medication list with RN to help you understand your care after this hospitalization.  Discharge Instructions: Please follow-up with PCP in 1-2 weeks  Please request your primary care physician to go over all Hospital Tests and Procedure/Radiological results at the follow up. Please get all Hospital records sent to your PCP by signing hospital release before you go home.   Do not take more than prescribed Pain, Sleep and Anxiety Medications. You were cared for by a hospitalist during your hospital stay. If you have any questions about your discharge medications or the care you received while you were in the hospital after you are discharged, you can call the unit @UNIT @ you were admitted to and ask to speak with the hospitalist on call if the  hospitalist that took care of you is not available.  Once you are discharged, your primary care physician will handle any further medical issues. Please note that NO REFILLS for any discharge medications will be authorized once you are discharged, as it is imperative that you return to your primary care physician (or establish a relationship with a primary care physician if you do not have one) for your aftercare needs so that they can reassess your need for medications and monitor your lab values. You Must read complete instructions/literature along with all the possible adverse reactions/side effects for all the Medicines you take and that have been prescribed to you. Take any new Medicines after you have completely understood and accept all the possible adverse reactions/side effects. Wear Seat belts while driving. If you have smoked or chewed Tobacco in the last 2 yrs please stop smoking and/or stop any Recreational drug use.  If you drink alcohol, please moderate the use and do not drive, operating heavy machinery, perform activities at heights, swimming or participation in water activities or provide baby sitting services under influence.   Increase activity slowly   Complete by: As directed      Discharge Exam: Filed Weights   11/05/18 1958 11/06/18 0120  Weight: 83.9 kg 91.2 kg   Vitals:   11/11/18 2014 11/12/18 0532  BP: (!) 152/90 139/87  Pulse: 79 79  Resp: 18 16  Temp: 99.1 F (37.3 C) 98.5 F (36.9 C)  SpO2: 100% 98%   General: Appear in no distress, no Rash; Oral Mucosa Clear, moist. no Abnormal Mass Or lumps Cardiovascular: S1 and S2 Present, no Murmur, Respiratory: normal respiratory effort, Bilateral Air entry present and Clear to Auscultation, no Crackles, no wheezes Abdomen: Bowel Sound persent, Soft and no tenderness, no hernia Extremities: no Pedal edema, no calf tenderness Neurology: alert and oriented to time, place, and person affect appropriate. normal without  focal findings, mental status, speech normal, alert and oriented x3, PERLA, Motor strength 5/5 and symmetric and sensation grossly normal to light touch   The results of significant diagnostics from this hospitalization (including imaging, microbiology, ancillary and laboratory) are listed below for reference.    Significant Diagnostic Studies: Ct Abdomen Pelvis Wo Contrast  Result Date: 11/06/2018 CLINICAL DATA:  58 year old male with nausea vomiting and diarrhea. Low H & H. history of lung cancer. EXAM: CT ABDOMEN AND PELVIS WITHOUT CONTRAST TECHNIQUE: Multidetector CT imaging of the abdomen and pelvis was performed following the standard protocol without IV contrast. COMPARISON:  CT of the abdomen pelvis dated 08/07/2018 FINDINGS: Evaluation of this exam is limited  in the absence of intravenous contrast. Lower chest: Several right lower lobe pulmonary nodules measure up to 7 mm and similar to prior CT. Right lower lobe pleural thickening and areas of nodularity. A 6.2 x 4.3 cm nodular masslike area at the right cardiophrenic angle most consistent with metastatic implants or abnormal lymph node. Cardiac pacemaker wire noted. No intra-abdominal free air or free fluid. Hepatobiliary: A 1.5 cm hepatic dome cyst. No intrahepatic biliary ductal dilatation. Mild irregularity of the liver contour may represent early changes of cirrhosis. The gallbladder is unremarkable. Pancreas: Unremarkable. No pancreatic ductal dilatation or surrounding inflammatory changes. Spleen: Normal in size without focal abnormality. Adrenals/Urinary Tract: The adrenal glands are unremarkable. Bilateral lower pole nonobstructing calculi measure approximately 2 mm. There is no hydronephrosis on either side. Subcentimeter exophytic lesion from the inferior pole of the right kidney is too small to characterize. The visualized ureters appear unremarkable. The urinary bladder is only partially distended. There is apparent diffuse thickening  of the bladder wall which may be partly related to underdistention. Cystitis is not excluded. Correlation with urinalysis recommended. Stomach/Bowel: There is mild inflammatory changes of loops of distal small bowel in the right lower quadrant most consistent with enteritis. Loose stool noted throughout the colon compatible with diarrheal state. Correlation with clinical exam and stool cultures recommended. There is no bowel obstruction. The appendix is normal. Vascular/Lymphatic: Mild aortoiliac atherosclerotic disease. The IVC is unremarkable. No portal venous gas. There is no adenopathy. Reproductive: The prostate and seminal vesicles are grossly unremarkable. No pelvic mass. Other: None Musculoskeletal: No acute or significant osseous findings. IMPRESSION: 1. Enteritis of distal small bowel loops with diarrheal state. Correlation with clinical exam and stool cultures recommended. No bowel obstruction. Normal appendix. 2. Small nonobstructing bilateral renal calculi. 3. Underdistention of the bladder versus cystitis. Correlation with urinalysis recommended. 4. Right pleural based metastatic disease, similar or slightly improved. 5. Stable right lower lobe pulmonary nodules. 6. Aortic Atherosclerosis (ICD10-I70.0). Electronically Signed   By: Anner Crete M.D.   On: 11/06/2018 01:30   Ct Chest W Contrast  Result Date: 10/23/2018 CLINICAL DATA:  Right lung adenocarcinoma status post chemotherapy. Restaging. Patient reports dyspnea. EXAM: CT CHEST WITH CONTRAST TECHNIQUE: Multidetector CT imaging of the chest was performed during intravenous contrast administration. CONTRAST:  38mL OMNIPAQUE IOHEXOL 300 MG/ML  SOLN COMPARISON:  07/10/2018 PET-CT. 08/07/2018 CT abdomen/pelvis. 05/01/2018 CT chest, abdomen and pelvis. FINDINGS: Cardiovascular: Normal heart size. No significant pericardial effusion/thickening. Two lead right subclavian pacemaker noted with lead tips in the right atrium and right ventricular  apex. Left anterior descending and right coronary atherosclerosis. Minimally atherosclerotic nonaneurysmal thoracic aorta. Normal caliber pulmonary arteries. No central pulmonary emboli. Mediastinum/Nodes: No discrete thyroid nodules. Unremarkable esophagus. No axillary adenopathy. Bulky right pericardiophrenic adenopathy, largest with short axis diameter 5.0 cm (series 2/image 116), previously 4.9 cm on 08/07/2018 CT abdomen study, stable. Stable 1.0 cm enlarged right prevascular node (series 2/image 67), previously 1.0 cm on 07/10/2018 PET-CT. Right paratracheal adenopathy up to 1.7 cm (series 2/image 51), previously 1.8 cm stable. No new pathologically enlarged mediastinal nodes. Enlarged 1.9 cm right hilar node, previously 1.8 cm, stable. No left hilar adenopathy. Lungs/Pleura: No pneumothorax. Small partially loculated right pleural effusion, not significantly changed. Diffuse irregular right pleural soft tissue thickening, not appreciably changed, for example measuring 0.8 cm thickness in the medial apical right pleural space (series 2/image 26), previously 0.9 cm. No left pleural effusion. Right perifissural nodularity has not appreciably changed, for example measuring 2.8 cm along  the major fissure (series 7/image 91), previously 2.8 cm. Irregular anterior right upper lobe 1.1 cm solid pulmonary nodule (series 7/image 54), previously 1.2 cm, not appreciably changed. Additional scattered right pulmonary nodules, largest 0.9 cm in the basilar right lower lobe (series 7/image 127), not appreciably changed. No acute consolidative airspace disease or new significant pulmonary nodules. Stable parenchymal banding scattered throughout the right lung. Upper abdomen: Simple 1.9 cm right liver dome cyst. Musculoskeletal: No aggressive appearing focal osseous lesions. Mild thoracic spondylosis. IMPRESSION: 1. Metastatic right mediastinal adenopathy involving paratracheal, prevascular and pericardiophrenic chains is  stable. 2. Scattered right pulmonary nodules are stable. 3. Stable small loculated right pleural effusion with stable irregular right pleural thickening. 4. No new or progressive metastatic disease in the chest. Aortic Atherosclerosis (ICD10-I70.0). Electronically Signed   By: Ilona Sorrel M.D.   On: 10/23/2018 16:19   Dg Chest Port 1 View  Result Date: 11/05/2018 CLINICAL DATA:  Nausea and dizziness.  Lung cancer. EXAM: PORTABLE CHEST 1 VIEW COMPARISON:  08/17/2018 FINDINGS: Unchanged position of right chest wall pacemaker leads. The heart size and mediastinal contours are within normal limits. Both lungs are clear. The visualized skeletal structures are unremarkable. IMPRESSION: No active disease. Electronically Signed   By: Ulyses Jarred M.D.   On: 11/05/2018 20:20    Microbiology: Recent Results (from the past 240 hour(s))  SARS CORONAVIRUS 2 (TAT 6-12 HRS) Nasal Swab Aptima Multi Swab     Status: None   Collection Time: 11/06/18 12:12 AM   Specimen: Aptima Multi Swab; Nasal Swab  Result Value Ref Range Status   SARS Coronavirus 2 NEGATIVE NEGATIVE Final    Comment: (NOTE) SARS-CoV-2 target nucleic acids are NOT DETECTED. The SARS-CoV-2 RNA is generally detectable in upper and lower respiratory specimens during the acute phase of infection. Negative results do not preclude SARS-CoV-2 infection, do not rule out co-infections with other pathogens, and should not be used as the sole basis for treatment or other patient management decisions. Negative results must be combined with clinical observations, patient history, and epidemiological information. The expected result is Negative. Fact Sheet for Patients: SugarRoll.be Fact Sheet for Healthcare Providers: https://www.woods-mathews.com/ This test is not yet approved or cleared by the [place] FDA and  has been authorized for detection and/or diagnosis of SARS-CoV-2 by FDA under an Emergency  Use Authorization (EUA). This EUA will remain  in effect (meaning this test can be used) for the duration of the COVID-19 declaration under Section 56 4(b)(1) of the Act, 21 U.S.C. section 360bbb-3(b)(1), unless the authorization is terminated or revoked sooner. Performed at [facility], [place] [address], [place], [place] [address]   Gastrointestinal Panel by PCR , Stool     Status: None   Collection Time: 11/06/18  6:30 PM   Specimen: Per Rectum; Stool  Result Value Ref Range Status   Campylobacter species NOT DETECTED NOT DETECTED Final   Plesimonas shigelloides NOT DETECTED NOT DETECTED Final   Salmonella species NOT DETECTED NOT DETECTED Final   Yersinia enterocolitica NOT DETECTED NOT DETECTED Final   Vibrio species NOT DETECTED NOT DETECTED Final   Vibrio cholerae NOT DETECTED NOT DETECTED Final   Enteroaggregative E coli (EAEC) NOT DETECTED NOT DETECTED Final   Enteropathogenic E coli (EPEC) NOT DETECTED NOT DETECTED Final   Enterotoxigenic E coli (ETEC) NOT DETECTED NOT DETECTED Final   Shiga like toxin producing E coli (STEC) NOT DETECTED NOT DETECTED Final   Shigella/Enteroinvasive E coli (EIEC) NOT DETECTED NOT DETECTED  Final   Cryptosporidium NOT DETECTED NOT DETECTED Final   Cyclospora cayetanensis NOT DETECTED NOT DETECTED Final   Entamoeba histolytica NOT DETECTED NOT DETECTED Final   Giardia lamblia NOT DETECTED NOT DETECTED Final   Adenovirus F40/41 NOT DETECTED NOT DETECTED Final   Astrovirus NOT DETECTED NOT DETECTED Final   Norovirus GI/GII NOT DETECTED NOT DETECTED Final   Rotavirus A NOT DETECTED NOT DETECTED Final   Sapovirus (I, II, IV, and V) NOT DETECTED NOT DETECTED Final    Comment: Performed at [facility], [address] [place], [place] [address]     Labs: CBC: No results for input(s): WBC, NEUTROABS, HGB, HCT, MCV, PLT in the last 168 hours. Basic Metabolic Panel: Recent Labs  Lab 11/09/18 0507 11/09/18 1636 11/10/18 0352  11/10/18 1655 11/11/18 0522 11/11/18 1557 11/12/18 0448  NA 135 134* 135 137  --  136 135  K 3.6 3.2* 3.2* 3.0*  --  3.4* 3.9  CL 109 105 106 104  --  103 103  CO2 22 22 23 24   --  24 24  GLUCOSE 92 111* 96 105*  --  99 86  BUN 7 7 7 7   --  9 10  CREATININE 1.17 1.16 1.03 1.00  --  1.07 1.13  CALCIUM 7.8* 7.9* 7.8* 7.9*  --  8.1* 8.0*  MG 1.4*  --  1.6*  --  1.5* 2.2 1.6*   Liver Function Tests: No results for input(s): AST, ALT, ALKPHOS, BILITOT, PROT, ALBUMIN in the last 168 hours. No results for input(s): LIPASE, AMYLASE in the last 168 hours. No results for input(s): AMMONIA in the last 168 hours. Cardiac Enzymes: No results for input(s): CKTOTAL, CKMB, CKMBINDEX, TROPONINI in the last 168 hours. BNP (last 3 results) Recent Labs    04/06/18 0601 05/01/18 1016  BNP 74.1 51.2   CBG: No results for input(s): GLUCAP in the last 168 hours. Time spent: 35 minutes  Signed:  Berle Mull  [facility] 11/12/2018

## 2018-11-27 ENCOUNTER — Inpatient Hospital Stay: Payer: No Typology Code available for payment source

## 2018-11-27 ENCOUNTER — Inpatient Hospital Stay: Payer: No Typology Code available for payment source | Admitting: Internal Medicine

## 2018-11-27 NOTE — Progress Notes (Signed)
Nutrition  Patient identified on Malnutrition Screening report for weight loss and poor appetite.   RD was planning on meeting with patient during infusion.  Patient did not show up for appointments today.    Mairi Stagliano B. Zenia Resides, [place], [place] Registered Dietitian [phone] (pager)

## 2018-11-28 ENCOUNTER — Telehealth: Payer: Self-pay | Admitting: *Deleted

## 2018-11-28 NOTE — Telephone Encounter (Signed)
Attempted to call regarding missed appts yesterday. No answer/voice mail full- could not leave message

## 2018-11-30 ENCOUNTER — Inpatient Hospital Stay: Payer: No Typology Code available for payment source

## 2018-11-30 ENCOUNTER — Inpatient Hospital Stay: Payer: No Typology Code available for payment source | Admitting: Physician Assistant

## 2018-11-30 ENCOUNTER — Telehealth: Payer: Self-pay | Admitting: Physician Assistant

## 2018-11-30 NOTE — Telephone Encounter (Signed)
R/s appt per 9/18 sch message - pt is aware of appt date and time

## 2018-12-03 ENCOUNTER — Ambulatory Visit: Payer: No Typology Code available for payment source | Admitting: Physician Assistant

## 2018-12-03 ENCOUNTER — Other Ambulatory Visit: Payer: No Typology Code available for payment source

## 2018-12-03 ENCOUNTER — Ambulatory Visit: Payer: No Typology Code available for payment source

## 2018-12-04 ENCOUNTER — Telehealth: Payer: Self-pay | Admitting: Physician Assistant

## 2018-12-04 NOTE — Telephone Encounter (Signed)
Scheduled appt per 9/22 sch message - pt aware of appt date and time scheduled for tomorrow

## 2018-12-05 ENCOUNTER — Ambulatory Visit: Payer: No Typology Code available for payment source

## 2018-12-05 ENCOUNTER — Ambulatory Visit: Payer: No Typology Code available for payment source | Admitting: Physician Assistant

## 2018-12-05 ENCOUNTER — Other Ambulatory Visit: Payer: No Typology Code available for payment source

## 2018-12-06 ENCOUNTER — Encounter: Payer: Self-pay | Admitting: *Deleted

## 2018-12-06 ENCOUNTER — Telehealth: Payer: Self-pay | Admitting: *Deleted

## 2018-12-06 NOTE — Progress Notes (Signed)
Oncology Nurse Navigator Documentation  Oncology Nurse Navigator Flowsheets 12/06/2018  Diagnosis Status -  Planned Course of Treatment -  Phase of Treatment -  Navigator Follow Up Reason: -  Navigator Location [facility]  Referral Date to RadOnc/MedOnc -  Navigator Encounter Type Telephone/I received a notification that Daniel Bryant has missed several appts.  I [facility] to see if she was aware but was unable to reach or leave vm message.  I called patient and was unable to reach or leave a vm message.   Telephone -  Abnormal Finding Date -  Confirmed Diagnosis Date -  Multidisiplinary Clinic Date -  Treatment Initiated Date -  Patient Visit Type -  Treatment Phase -  Barriers/Navigation Needs Coordination of Care;Education  Education -  Interventions Coordination of Care  Coordination of Care Other  Education Method -  Acuity Level 2-Minimal Needs (1-2 Barriers Identified)  Acuity Level 2 -  Time Spent with Patient 30

## 2018-12-06 NOTE — Telephone Encounter (Signed)
Oncology Nurse Navigator Documentation  Oncology Nurse Navigator Flowsheets 12/06/2018  Diagnosis Status -  Planned Course of Treatment -  Phase of Treatment -  Navigator Follow Up Reason: -  Navigator Location [facility]  Referral Date to RadOnc/MedOnc -  Navigator Encounter Type Telephone/I called [facility] again today but was unable to reach or leave a vm message.  I called the patient and was able to reach him.  I updated him on his appt on 10/6.  He states he needs transportation help.  I called and arrange transportation for patient on 10/6 and he is aware.    Telephone Outgoing Call  Abnormal Finding Date -  Confirmed Diagnosis Date -  Multidisiplinary Clinic Date -  Treatment Initiated Date -  Patient Visit Type -  Treatment Phase -  Barriers/Navigation Needs Coordination of Care;Education  Education Other  Interventions Coordination of Care;Education  Coordination of Care Other  Education Method Verbal  Acuity Level 3-Moderate Needs (3-4 Barriers Identified)  Acuity Level 2 -  Time Spent with Patient 47

## 2018-12-14 ENCOUNTER — Telehealth: Payer: Self-pay | Admitting: *Deleted

## 2018-12-14 NOTE — Telephone Encounter (Signed)
Oncology Nurse Navigator Documentation  Oncology Nurse Navigator Flowsheets 12/14/2018  Diagnosis Status -  Planned Course of Treatment -  Phase of Treatment -  Navigator Follow Up Reason: -  Navigator Location [facility]  Referral Date to RadOnc/MedOnc -  Navigator Encounter Type Telephone/I called Daniel Bryant to remind him of his appt on 10/6.  He verbalized understanding of appt and that transportation will be picking him up at 12 noon.   Telephone Outgoing Call  Abnormal Finding Date -  Confirmed Diagnosis Date -  Multidisiplinary Clinic Date -  Treatment Initiated Date -  Patient Visit Type -  Treatment Phase Treatment  Barriers/Navigation Needs Education  Education Other  Interventions Education  Coordination of Care -  Education Method Verbal  Acuity Level 2-Minimal Needs (1-2 Barriers Identified)  Acuity Level 2 -  Time Spent with Patient 15

## 2018-12-18 ENCOUNTER — Inpatient Hospital Stay: Payer: No Typology Code available for payment source | Attending: Internal Medicine | Admitting: Physician Assistant

## 2018-12-18 ENCOUNTER — Inpatient Hospital Stay: Payer: No Typology Code available for payment source

## 2018-12-18 ENCOUNTER — Other Ambulatory Visit: Payer: Self-pay

## 2018-12-18 VITALS — BP 183/113 | HR 87 | Temp 98.5°F | Resp 17 | Ht 72.0 in | Wt 207.9 lb

## 2018-12-18 DIAGNOSIS — C3411 Malignant neoplasm of upper lobe, right bronchus or lung: Secondary | ICD-10-CM | POA: Diagnosis not present

## 2018-12-18 DIAGNOSIS — E876 Hypokalemia: Secondary | ICD-10-CM

## 2018-12-18 DIAGNOSIS — Z79899 Other long term (current) drug therapy: Secondary | ICD-10-CM | POA: Diagnosis not present

## 2018-12-18 DIAGNOSIS — I10 Essential (primary) hypertension: Secondary | ICD-10-CM

## 2018-12-18 DIAGNOSIS — C3491 Malignant neoplasm of unspecified part of right bronchus or lung: Secondary | ICD-10-CM

## 2018-12-18 DIAGNOSIS — R197 Diarrhea, unspecified: Secondary | ICD-10-CM

## 2018-12-18 LAB — CMP (CANCER CENTER ONLY)
ALT: 14 U/L (ref 0–44)
AST: 26 U/L (ref 15–41)
Albumin: 3.4 g/dL — ABNORMAL LOW (ref 3.5–5.0)
Alkaline Phosphatase: 109 U/L (ref 38–126)
Anion gap: 13 (ref 5–15)
BUN: 4 mg/dL — ABNORMAL LOW (ref 6–20)
CO2: 27 mmol/L (ref 22–32)
Calcium: 7.6 mg/dL — ABNORMAL LOW (ref 8.9–10.3)
Chloride: 100 mmol/L (ref 98–111)
Creatinine: 0.84 mg/dL (ref 0.61–1.24)
GFR, Est AFR Am: >60 mL/min (ref >60)
GFR, Estimated: >60 mL/min (ref >60)
Glucose, Bld: 111 mg/dL — ABNORMAL HIGH (ref 70–99)
Potassium: 2.6 mmol/L — CL (ref 3.5–5.1)
Sodium: 140 mmol/L (ref 135–145)
Total Bilirubin: 0.8 mg/dL (ref 0.3–1.2)
Total Protein: 6.5 g/dL (ref 6.5–8.1)

## 2018-12-18 LAB — CBC WITH DIFFERENTIAL (CANCER CENTER ONLY)
Abs Immature Granulocytes: 0.03 10*3/uL (ref 0.00–0.07)
Basophils Absolute: 0.1 10*3/uL (ref 0.0–0.1)
Basophils Relative: 1 %
Eosinophils Absolute: 0.5 10*3/uL (ref 0.0–0.5)
Eosinophils Relative: 6 %
HCT: 36.8 % — ABNORMAL LOW (ref 39.0–52.0)
Hemoglobin: 12.9 g/dL — ABNORMAL LOW (ref 13.0–17.0)
Immature Granulocytes: 0 %
Lymphocytes Relative: 21 %
Lymphs Abs: 1.7 10*3/uL (ref 0.7–4.0)
MCH: 35.5 pg — ABNORMAL HIGH (ref 26.0–34.0)
MCHC: 35.1 g/dL (ref 30.0–36.0)
MCV: 101.4 fL — ABNORMAL HIGH (ref 80.0–100.0)
Monocytes Absolute: 0.6 10*3/uL (ref 0.1–1.0)
Monocytes Relative: 8 %
Neutro Abs: 5 10*3/uL (ref 1.7–7.7)
Neutrophils Relative %: 64 %
Platelet Count: 150 10*3/uL (ref 150–400)
RBC: 3.63 MIL/uL — ABNORMAL LOW (ref 4.22–5.81)
RDW: 14.6 % (ref 11.5–15.5)
WBC Count: 8 10*3/uL (ref 4.0–10.5)
nRBC: 0 % (ref 0.0–0.2)

## 2018-12-18 LAB — MAGNESIUM: Magnesium: 0.9 mg/dL — CL (ref 1.7–2.4)

## 2018-12-18 LAB — TSH: TSH: 1.84 u[IU]/mL (ref 0.320–4.118)

## 2018-12-18 MED ORDER — SODIUM CHLORIDE 0.9 % IV SOLN
Freq: Once | INTRAVENOUS | Status: AC
  Administered 2018-12-18: 15:00:00 via INTRAVENOUS
  Filled 2018-12-18: qty 250

## 2018-12-18 MED ORDER — SODIUM CHLORIDE 0.9 % IV SOLN
Freq: Once | INTRAVENOUS | Status: AC
  Administered 2018-12-18: 15:00:00 via INTRAVENOUS
  Filled 2018-12-18: qty 1000

## 2018-12-18 MED ORDER — POTASSIUM CHLORIDE CRYS ER 20 MEQ PO TBCR: 20.0000 meq | EXTENDED_RELEASE_TABLET | Freq: Two times a day (BID) | ORAL | 0 refills | Status: DC

## 2018-12-18 NOTE — Progress Notes (Signed)
Nutrition Assessment   Reason for Assessment:  Patient identified on Malnutrition Screening tool for poor appetite and weight loss.   ASSESSMENT:  58 year old male with stage IV nonsmall cell lung cancer.  Past medical history of COPD, GERD, HLD, HTN, PTSD, seizures, memory issues.  Noted last chemotherapy treatment on 10/16/2018.  Noted recent hospital admission for viral gastritis.    Met with patient during infusion.  Patient reports that his appetite is "great".  Reports that he typically as cereal or egg sandwich about 9am.  Lunch is usually around 1-2pm and will eat tomato sandwich or banana sandwich.  Dinner is around 7pm and will eat meat and vegetables usually.      Nutrition Focused Physical Exam: deferred   Medications: folic acid, MVI, K Cl, compazine   Labs: K 2.6 (supplemented), Mag 0.9 (supplemented)   Anthropometrics:   Height: 72 inches Weight: 207 lb 14.4 oz today increased from 201 lb on 8/25  Noted weight on 6/23 220 lb, 5/29 229 lb BMI: 27 10% weight loss in the last 5 months  Estimated Energy Needs  Kcals: 2100-2350 calories Protein: 105-118 g Fluid: > 2 L   NUTRITION DIAGNOSIS: Inadequate oral intake related to hospital admission, recent viral gastroenteritis and cancer as evidenced by 10% weight loss in 5 months   INTERVENTION:  Encouraged patient to continue to eat good sources of protein at every meal.  Provided examples of protein foods and discussed how he could add these in diet.   Discussed importance of eating well and maintaining weight and muscle mass.   Contact information provided      MONITORING, EVALUATION, GOAL: Patient will consume adequate calories and protein to prevent weight loss   Next Visit: as needed  Kylo Gavin B. Zenia Resides, [place], [place] Registered Dietitian [phone] (pager)

## 2018-12-18 NOTE — Progress Notes (Signed)
[place] [address]  DIAGNOSIS: Stage IV (T1b, N2, M1) non-small cell lung cancer, adenocarcinoma diagnosed in January 2020. He presented with right upper lobe lung nodule in addition to mediastinal lymphadenopathy and malignant right pleural effusion.  PRIOR THERAPY: None  CURRENT THERAPY: Systemic chemotherapy with carboplatin for AUC of 5, Alimta 500 mg/M2 and Keytruda 200 mg IV every 3 weeks status post 4 cycles.  First dose was given on July 04, 2018.  [name] was discontinued starting from cycle #2 because of significant skin rash as well as diarrhea.  His dose of carboplatin was also reduced to AUC of 5 and Alimta to 400 mg/M2 starting from cycle #3 secondary to intolerance.  INTERVAL HISTORY: Daniel Bryant 58 y.o. male returns to the clinic for a follow-up visit.  The patient's most recent treatment with chemotherapy was on 10/16/2018.  The patient had been hospitalized in the interval for viral gastritis.  Since being discharged from the hospital he has missed several appointments secondary to memory issues. [place].   Today he is feeling fairly well today without any concerning complaints.  He denies any recent fever, chills, night sweats, weight loss. He is scheduled to see the nutritionist while here in infusion today regarding his prior history of weight loss.  He denies any chest pain, shortness of breath, cough, or hemoptysis.  He denies any nausea, vomiting, diarrhea, constipation.  He states that he usually feels nauseous the night of chemotherapy but that it resolves the following morning. He denies any headache or visual changes.  He is here today for evaluation before resuming his treatment.  MEDICAL HISTORY: Past Medical History:  Diagnosis Date  . Asthma   . Cancer ([place])   . COPD (chronic obstructive pulmonary disease)  ([facility])   . GERD (gastroesophageal reflux disease)   . High cholesterol   . History of petit-mal seizures   . Hyperlipidemia   . Hypertension   . Memory impairment   . OSA (obstructive sleep apnea)    Does not tolerate CPAP  . Pacemaker   . PTSD (post-traumatic stress disorder)   . Sick sinus syndrome ([facility])     ALLERGIES:  is allergic to other.  MEDICATIONS:  Current Outpatient Medications  Medication Sig Dispense Refill  . albuterol (PROVENTIL) (2.5 MG/3ML) 0.083% nebulizer solution Take 3 mLs (2.5 mg total) by nebulization 2 (two) times daily as needed for wheezing or shortness of breath. 75 mL 0  . Amino Acids-Protein Hydrolys (FEEDING SUPPLEMENT, PRO-STAT SUGAR FREE 64,) LIQD Take 30 mLs by mouth 2 (two) times daily. 887 mL 0  . aspirin EC 81 MG EC tablet Take 1 tablet (81 mg total) by mouth daily. (Patient not taking: Reported on 11/05/2018)    . atorvastatin (LIPITOR) 40 MG tablet Take 40 mg by mouth daily.     . cholestyramine (QUESTRAN) 4 g packet Take 1 packet (4 g total) by mouth 2 (two) times daily. 10 each 0  . diphenhydrAMINE (BENADRYL) 25 MG tablet Take 1 tablet (25 mg total) by mouth every 6 (six) hours as needed for itching or allergies. 20 tablet 0  . diphenoxylate-atropine (LOMOTIL) 2.5-0.025 MG tablet Take 1 tablet by mouth 4 (four) times daily as needed for diarrhea or loose stools. 30 tablet 0  . escitalopram (LEXAPRO) 20 MG tablet Take 20 mg by mouth daily.     . famotidine (  PEPCID) 20 MG tablet Take 1 tablet (20 mg total) by mouth daily. 30 tablet 0  . folic acid (FOLVITE) 1 MG tablet Take 1 tablet (1 mg total) by mouth daily. (Patient not taking: Reported on 11/05/2018) 30 tablet 4  . lidocaine (XYLOCAINE) 2 % solution Use as directed 5 mLs in the mouth or throat every 3 (three) hours as needed for mouth pain. Gargle and spit 200 mL 1  . lisinopril (ZESTRIL) 5 MG tablet Take 5 mg by mouth daily.    . Multiple Vitamin (MULTIVITAMIN WITH MINERALS) TABS tablet Take 1  tablet by mouth daily. 30 tablet 0  . potassium chloride SA (KLOR-CON) 20 MEQ tablet Take 1 tablet (20 mEq total) by mouth 2 (two) times daily for 7 days. 14 tablet 0  . prochlorperazine (COMPAZINE) 10 MG tablet Take 1 tablet (10 mg total) by mouth every 6 (six) hours as needed for nausea or vomiting. 30 tablet 0   No current facility-administered medications for this visit.     SURGICAL HISTORY:  Past Surgical History:  Procedure Laterality Date  . CARDIAC SURGERY    . CHEST TUBE INSERTION Right 05/16/2018   Procedure: INSERTION PLEURAL DRAINAGE CATHETER;  Surgeon: Ivin Poot, MD;  Location: [facility];  Service: Thoracic;  Laterality: Right;  . CHEST TUBE INSERTION Right 05/16/2018   Procedure: Chest Tube Insertion;  Surgeon: Ivin Poot, MD;  Location: [facility];  Service: Thoracic;  Laterality: Right;  . PACEMAKER INSERTION    . REMOVAL OF PLEURAL DRAINAGE CATHETER Right 08/30/2018   Procedure: REMOVAL OF PLEURAL DRAINAGE CATHETER;  Surgeon: Ivin Poot, MD;  Location: [facility];  Service: Thoracic;  Laterality: Right;    REVIEW OF SYSTEMS:   Review of Systems  Constitutional: Negative for appetite change, chills, fatigue, fever and unexpected weight change.  HENT:  Negative for mouth sores, nosebleeds, sore throat and trouble swallowing.   Eyes: Negative for eye problems and icterus.  Respiratory: Negative for cough, hemoptysis, shortness of breath and wheezing.   Cardiovascular: Negative for chest pain and leg swelling.  Gastrointestinal: Negative for abdominal pain, constipation, diarrhea, nausea and vomiting.  Genitourinary: Negative for bladder incontinence, difficulty urinating, dysuria, frequency and hematuria.   Musculoskeletal: Negative for back pain, gait problem, neck pain and neck stiffness.  Skin: Negative for itching and rash.  Neurological: Negative for dizziness, extremity weakness, gait problem, headaches, light-headedness and seizures.  Hematological: Negative for  adenopathy. Does not bruise/bleed easily.  Psychiatric/Behavioral: Positive for memory issues. Negative for confusion, depression and sleep disturbance. The patient is not nervous/anxious.     PHYSICAL EXAMINATION:  Blood pressure (!) 183/113, pulse 87, temperature 98.5 F (36.9 C), temperature source Temporal, resp. rate 17, height 6' (1.829 m), weight 207 lb 14.4 oz (94.3 kg), SpO2 100 %.  ECOG PERFORMANCE STATUS: 1 - Symptomatic but completely ambulatory  Physical Exam  Constitutional: Oriented to person, place, and time and well-developed, well-nourished, and in no distress.  HENT:  Head: Normocephalic and atraumatic.  Mouth/Throat: Oropharynx is clear and moist. No oropharyngeal exudate.  Eyes: Conjunctivae are normal. Right eye exhibits no discharge. Left eye exhibits no discharge. No scleral icterus.  Neck: Normal range of motion. Neck supple.  Cardiovascular: Normal rate, regular rhythm, normal heart sounds and intact distal pulses.   Pulmonary/Chest: Effort normal. Mild wheezing in all lung fields. No respiratory distress. No rales.  Abdominal: Soft. Bowel sounds are normal. Exhibits no distension and no mass. There is no tenderness.  Musculoskeletal: Normal  range of motion. Exhibits no edema.  Lymphadenopathy:    No cervical adenopathy.  Neurological: Alert and oriented to person, place, and time. Exhibits normal muscle tone. Gait normal. Coordination normal.  Skin: Skin is warm and dry. No rash noted. Not diaphoretic. No erythema. No pallor.  Psychiatric: Positive for memory issues. Mood, and judgment normal.  Vitals reviewed.  LABORATORY DATA: Lab Results  Component Value Date   WBC 8.0 12/18/2018   HGB 12.9 (L) 12/18/2018   HCT 36.8 (L) 12/18/2018   MCV 101.4 (H) 12/18/2018   PLT 150 12/18/2018      Chemistry      Component Value Date/Time   NA 140 12/18/2018 1227   K 2.6 (LL) 12/18/2018 1227   CL 100 12/18/2018 1227   CO2 27 12/18/2018 1227   BUN 4 (L)  12/18/2018 1227   CREATININE 0.84 12/18/2018 1227      Component Value Date/Time   CALCIUM 7.6 (L) 12/18/2018 1227   ALKPHOS 109 12/18/2018 1227   AST 26 12/18/2018 1227   ALT 14 12/18/2018 1227   BILITOT 0.8 12/18/2018 1227       RADIOGRAPHIC STUDIES:  No results found.   ASSESSMENT/PLAN: This is a very pleasant 58 year old Caucasian male with metastatic non-small cell lung cancer, adenocarcinoma.  He presented with a right upper lobe lung mass with mediastinal lymphadenopathy and a right malignant pleural effusion.  He was diagnosed in January 2020.  The patient had underwent systemic chemotherapy with carboplatin for an AUC of 5, Alimta 500 mg/m2, and Keytruda 200 mg IV every 3 weeks.  The patient is status post 4 cycles of chemotherapy; however, [name] was discontinued after cycle #2 due to a significant skin rash.  His dose of carboplatin was scheduled to be reduced to AUC of 4 starting from cycle #5 as well as Alimta reduced to 400 mg/m due to concerns with tolerability and toxicity.  Patient's most recent treatment was on October 16, 2018.  The patient was in the hospital from November 05, 2018-August 31st, 2020 for viral gastritis. The patient missed several rescheduled appointments for chemotherapy following his hospitalization.   The patient was seen with Dr. Julien Nordmann today.  Labs were reviewed.  The patient's labs show hypokalemia with a potassium of 2.6.  The patient's magnesium is also low at 0.9 today.  Considering that the patient has not had chemotherapy in approximately 10 weeks, Dr. Earlie Server would like to hold treatment with chemotherapy this week until the patient can get a restaging CT scan of the chest, abdomen, and pelvis.  The patient will receive 40 mEq of potassium chloride while in the clinic today.  The patient will also received 6 g of magnesium sulfate IV.  I have also sent a prescription of 20 mEq of potassium chloride to be taken P.O. twice daily for 7 days.  I  discussed the importance of taking his supplemental potassium including the risk of fatal cardiac arrhythmias with hypokalemia.  The patient expressed understanding.  I will arrange for the patient to have a restaging CT scan of the chest, abdomen, and pelvis performed next week.  We will see the patient back a few days later to review the scan results and recommendations for treatment options.  See the patient back for follow-up visit in approximately 1 week for evaluation and to review his restaging CT scan.  The patient's blood pressure was noted to be elevated while in the clinic today.  The patient states that he "always" has problems  with his blood pressure.  He states that he took his medications today as prescribed.  I will arrange for the patient to have 0.2 mg of clonidine while in the clinic today.  The patient has a blood pressure cuff at home and was advised to monitor his blood pressure at the same time a few times a week and to keep a log of his recordings.  I recommended that he share these with his primary care provider for consideration of adjustment of his blood pressure medications.  The patient has trouble with transportation secondary to his memory problems.  He states that when he drives he cannot remember where he is going.  I have reached out to our transportation coordinator to help arrange transportation for his appointments.  The patient is scheduled to meet with the nutritionist team at the cancer center while in infusion today.   The patient was advised to call immediately if he has any concerning symptoms in the interval. The patient voices understanding of current disease status and treatment options and is in agreement with the current care plan. All questions were answered. The patient knows to call the clinic with any problems, questions or concerns. We can certainly see the patient much sooner if necessary   Orders Placed This Encounter  Procedures  . CT Chest W  Contrast    Standing Status:   Future    Standing Expiration Date:   12/18/2019    Scheduling Instructions:     Please schedule for 12/25/2018    Order Specific Question:   ** REASON FOR EXAM (FREE TEXT)    Answer:   Restaging Lung Cancer    Order Specific Question:   If indicated for the ordered procedure, I authorize the administration of contrast media per Radiology protocol    Answer:   Yes    Order Specific Question:   Preferred imaging location?    Answer:   [facility]    Order Specific Question:   Radiology Contrast Protocol - do NOT remove file path    Answer:   \\[email or web address]  . CT Abdomen Pelvis W Contrast    Standing Status:   Future    Standing Expiration Date:   12/18/2019    Scheduling Instructions:     Please schedule for around 12/25/2018    Order Specific Question:   ** REASON FOR EXAM (FREE TEXT)    Answer:   Restaging Lung Cancer    Order Specific Question:   If indicated for the ordered procedure, I authorize the administration of contrast media per Radiology protocol    Answer:   Yes    Order Specific Question:   Preferred imaging location?    Answer:   [facility]    Order Specific Question:   Is Oral Contrast requested for this exam?    Answer:   Yes, Per Radiology protocol    Order Specific Question:   Radiology Contrast Protocol - do NOT remove file path    Answer:   \\[email or web address]  . CBC with Differential ([facility] Only)    Standing Status:   Standing    Number of Occurrences:   10    Standing Expiration Date:   12/18/2019  . CMP ([place] [facility] only)    Standing Status:   Standing    Number of Occurrences:   10    Standing Expiration Date:   12/18/2019     Rex Magee L Shauntay Brunelli, PA-C 12/18/18

## 2018-12-18 NOTE — Patient Instructions (Signed)
-  Please make sure that you are taking your blood pressure medication every day.  -Please take your blood pressure around the same time every day and write down what your readings are. Try to take it around the same time every day after you take your blood pressure medication -Please see your primary doctor at the [facility] to adjust your blood pressure medications.  -Please make sure you pick up your potassium supplements. It is super important.

## 2018-12-18 NOTE — Patient Instructions (Signed)
Hypokalemia Hypokalemia means that the amount of potassium in the blood is lower than normal. Potassium is a chemical (electrolyte) that helps regulate the amount of fluid in the body. It also stimulates muscle tightening (contraction) and helps nerves work properly. Normally, most of the body's potassium is inside cells, and only a very small amount is in the blood. Because the amount in the blood is so small, minor changes to potassium levels in the blood can be life-threatening. What are the causes? This condition may be caused by:  Antibiotic medicine.  Diarrhea or vomiting. Taking too much of a medicine that helps you have a bowel movement (laxative) can cause diarrhea and lead to hypokalemia.  Chronic kidney disease (CKD).  Medicines that help the body get rid of excess fluid (diuretics).  Eating disorders, such as bulimia.  Low magnesium levels in the body.  Sweating a lot. What are the signs or symptoms? Symptoms of this condition include:  Weakness.  Constipation.  Fatigue.  Muscle cramps.  Mental confusion.  Skipped heartbeats or irregular heartbeat (palpitations).  Tingling or numbness. How is this diagnosed? This condition is diagnosed with a blood test. How is this treated? This condition may be treated by:  Taking potassium supplements by mouth.  Adjusting the medicines that you take.  Eating more foods that contain a lot of potassium. If your potassium level is very low, you may need to get potassium through an IV and be monitored in the hospital. Follow these instructions at home:   Take over-the-counter and prescription medicines only as told by your health care provider. This includes vitamins and supplements.  Eat a healthy diet. A healthy diet includes fresh fruits and vegetables, whole grains, healthy fats, and lean proteins.  If instructed, eat more foods that contain a lot of potassium. This includes: ? Nuts, such as peanuts and pistachios.  ? Seeds, such as sunflower seeds and pumpkin seeds. ? Peas, lentils, and lima beans. ? Whole grain and bran cereals and breads. ? Fresh fruits and vegetables, such as apricots, avocado, bananas, cantaloupe, kiwi, oranges, tomatoes, asparagus, and potatoes. ? Orange juice. ? Tomato juice. ? Red meats. ? Yogurt.  Keep all follow-up visits as told by your health care provider. This is important. Contact a health care provider if you:  Have weakness that gets worse.  Feel your heart pounding or racing.  Vomit.  Have diarrhea.  Have diabetes (diabetes mellitus) and you have trouble keeping your blood sugar (glucose) in your target range. Get help right away if you:  Have chest pain.  Have shortness of breath.  Have vomiting or diarrhea that lasts for more than 2 days.  Faint. Summary  Hypokalemia means that the amount of potassium in the blood is lower than normal.  This condition is diagnosed with a blood test.  Hypokalemia may be treated by taking potassium supplements, adjusting the medicines that you take, or eating more foods that are high in potassium.  If your potassium level is very low, you may need to get potassium through an IV and be monitored in the hospital. This information is not intended to replace advice given to you by your health care provider. Make sure you discuss any questions you have with your health care provider. Document Released: 02/28/2005 Document Revised: 10/11/2017 Document Reviewed: 10/11/2017 Elsevier Patient Education  [place].    Hypomagnesemia Hypomagnesemia is a condition in which the level of magnesium in the blood is low. Magnesium is a mineral that is  found in many foods. It is used in many different processes in the body. Hypomagnesemia can affect every organ in the body. In severe cases, it can cause life-threatening problems. What are the causes? This condition may be caused by:  Not getting enough magnesium in your  diet.  Malnutrition.  Problems with absorbing magnesium from the intestines.  Dehydration.  Alcohol abuse.  Vomiting.  Severe or chronic diarrhea.  Some medicines, including medicines that make you urinate more (diuretics).  Certain diseases, such as kidney disease, diabetes, celiac disease, and overactive thyroid. What are the signs or symptoms? Symptoms of this condition include:  Loss of appetite.  Nausea and vomiting.  Involuntary shaking or trembling of a body part (tremor).  Muscle weakness.  Tingling in the arms and legs.  Sudden tightening of muscles (muscle spasms).  Confusion.  Psychiatric issues, such as depression, irritability, or psychosis.  A feeling of fluttering of the heart.  Seizures. These symptoms are more severe if magnesium levels drop suddenly. How is this diagnosed? This condition may be diagnosed based on:  Your symptoms and medical history.  A physical exam.  Blood and urine tests. How is this treated? Treatment depends on the cause and the severity of the condition. It may be treated with:  A magnesium supplement. This can be taken in pill form. If the condition is severe, magnesium is usually given through an IV.  Changes to your diet. You may be directed to eat foods that have a lot of magnesium, such as green leafy vegetables, peas, beans, and nuts.  Stopping any intake of alcohol. Follow these instructions at home:      Make sure that your diet includes foods with magnesium. Foods that have a lot of magnesium in them include: ? Green leafy vegetables, such as spinach and broccoli. ? Beans and peas. ? Nuts and seeds, such as almonds and sunflower seeds. ? Whole grains, such as whole grain bread and fortified cereals.  Take magnesium supplements if your health care provider tells you to do that. Take them as directed.  Take over-the-counter and prescription medicines only as told by your health care provider.  Have  your magnesium levels monitored as told by your health care provider.  When you are active, drink fluids that contain electrolytes.  Avoid drinking alcohol.  Keep all follow-up visits as told by your health care provider. This is important. Contact a health care provider if:  You get worse instead of better.  Your symptoms return. Get help right away if you:  Develop severe muscle weakness.  Have trouble breathing.  Feel that your heart is racing. Summary  Hypomagnesemia is a condition in which the level of magnesium in the blood is low.  Hypomagnesemia can affect every organ in the body.  Treatment may include eating more foods that contain magnesium, taking magnesium supplements, and not drinking alcohol.  Have your magnesium levels monitored as told by your health care provider. This information is not intended to replace advice given to you by your health care provider. Make sure you discuss any questions you have with your health care provider. Document Released: 11/24/2004 Document Revised: 02/10/2017 Document Reviewed: 01/30/2017 Elsevier Patient Education  2020 [employer].

## 2018-12-24 ENCOUNTER — Telehealth: Payer: Self-pay | Admitting: Physician Assistant

## 2018-12-24 NOTE — Telephone Encounter (Signed)
Called to confirm appts per 10/06 los - pt is aware of appt date and time

## 2018-12-25 ENCOUNTER — Telehealth: Payer: Self-pay | Admitting: *Deleted

## 2018-12-25 ENCOUNTER — Inpatient Hospital Stay: Payer: No Typology Code available for payment source

## 2018-12-25 ENCOUNTER — Ambulatory Visit (HOSPITAL_COMMUNITY)
Admission: RE | Admit: 2018-12-25 | Discharge: 2018-12-25 | Disposition: A | Discharge status: Home / Self Care | Payer: No Typology Code available for payment source | Source: Ambulatory Visit | Attending: Physician Assistant | Admitting: Physician Assistant

## 2018-12-25 ENCOUNTER — Other Ambulatory Visit: Payer: Self-pay | Admitting: *Deleted

## 2018-12-25 ENCOUNTER — Other Ambulatory Visit: Payer: Self-pay

## 2018-12-25 ENCOUNTER — Other Ambulatory Visit: Payer: Self-pay | Admitting: Physician Assistant

## 2018-12-25 ENCOUNTER — Encounter (HOSPITAL_COMMUNITY): Payer: Self-pay | Admitting: Radiology

## 2018-12-25 DIAGNOSIS — C3491 Malignant neoplasm of unspecified part of right bronchus or lung: Secondary | ICD-10-CM | POA: Insufficient documentation

## 2018-12-25 DIAGNOSIS — E876 Hypokalemia: Secondary | ICD-10-CM

## 2018-12-25 LAB — CBC WITH DIFFERENTIAL (CANCER CENTER ONLY)
Abs Immature Granulocytes: 0.02 10*3/uL (ref 0.00–0.07)
Basophils Absolute: 0.1 10*3/uL (ref 0.0–0.1)
Basophils Relative: 1 %
Eosinophils Absolute: 0.5 10*3/uL (ref 0.0–0.5)
Eosinophils Relative: 6 %
HCT: 41 % (ref 39.0–52.0)
Hemoglobin: 14.2 g/dL (ref 13.0–17.0)
Immature Granulocytes: 0 %
Lymphocytes Relative: 21 %
Lymphs Abs: 1.8 10*3/uL (ref 0.7–4.0)
MCH: 35.8 pg — ABNORMAL HIGH (ref 26.0–34.0)
MCHC: 34.6 g/dL (ref 30.0–36.0)
MCV: 103.3 fL — ABNORMAL HIGH (ref 80.0–100.0)
Monocytes Absolute: 0.7 10*3/uL (ref 0.1–1.0)
Monocytes Relative: 8 %
Neutro Abs: 5.5 10*3/uL (ref 1.7–7.7)
Neutrophils Relative %: 64 %
Platelet Count: 138 10*3/uL — ABNORMAL LOW (ref 150–400)
RBC: 3.97 MIL/uL — ABNORMAL LOW (ref 4.22–5.81)
RDW: 13.5 % (ref 11.5–15.5)
WBC Count: 8.6 10*3/uL (ref 4.0–10.5)
nRBC: 0 % (ref 0.0–0.2)

## 2018-12-25 LAB — CMP (CANCER CENTER ONLY)
ALT: 13 U/L (ref 0–44)
AST: 23 U/L (ref 15–41)
Albumin: 3.5 g/dL (ref 3.5–5.0)
Alkaline Phosphatase: 140 U/L — ABNORMAL HIGH (ref 38–126)
Anion gap: 13 (ref 5–15)
BUN: 6 mg/dL (ref 6–20)
CO2: 32 mmol/L (ref 22–32)
Calcium: 8.3 mg/dL — ABNORMAL LOW (ref 8.9–10.3)
Chloride: 99 mmol/L (ref 98–111)
Creatinine: 1.07 mg/dL (ref 0.61–1.24)
GFR, Est AFR Am: >60 mL/min (ref >60)
GFR, Estimated: >60 mL/min (ref >60)
Glucose, Bld: 112 mg/dL — ABNORMAL HIGH (ref 70–99)
Potassium: 2.9 mmol/L — CL (ref 3.5–5.1)
Sodium: 144 mmol/L (ref 135–145)
Total Bilirubin: 1.1 mg/dL (ref 0.3–1.2)
Total Protein: 6.8 g/dL (ref 6.5–8.1)

## 2018-12-25 MED ORDER — SODIUM CHLORIDE (PF) 0.9 % IJ SOLN
INTRAMUSCULAR | Status: AC
  Filled 2018-12-25: qty 50

## 2018-12-25 MED ORDER — SODIUM CHLORIDE 0.9 % IV SOLN
Freq: Once | INTRAVENOUS | Status: AC
  Administered 2018-12-25: 15:00:00 via INTRAVENOUS
  Filled 2018-12-25: qty 1000

## 2018-12-25 MED ORDER — IOHEXOL 300 MG/ML  SOLN
100.0000 mL | Freq: Once | INTRAMUSCULAR | Status: AC | PRN
  Administered 2018-12-25: 100 mL via INTRAVENOUS

## 2018-12-25 NOTE — Telephone Encounter (Signed)
Critical Lab called  Value: Potassium 2.9  [name] Heilingoetter, PA notified.    Action:  Patient to receive IV potassium today in the infusion suite.  Patient is aware.

## 2018-12-25 NOTE — Progress Notes (Signed)
Ok to stop potassium infusion at close of clinic today per [name] H PA.  Patient aware, he agrees. Will stop the infusion at 6pm or when clinic closes if after 6pm.

## 2018-12-25 NOTE — Telephone Encounter (Signed)
CRITICAL VALUE STICKER  CRITICAL VALUE: K+ = 2.9.  RECEIVER (on-site recipient of call): Mining engineer, Triage [place].   DATE & TIME NOTIFIED: 12/25/2018 at 1244.   MESSENGER (representative from lab): Rosann Auerbach [facility].  MD NOTIFIED: Collaborative for [name] Heilingoetter PA.  TIME OF NOTIFICATION: 12/25/2018 at 1305.  RESPONSE: None.

## 2018-12-25 NOTE — Patient Instructions (Signed)
Hypokalemia Hypokalemia means that the amount of potassium in the blood is lower than normal. Potassium is a chemical (electrolyte) that helps regulate the amount of fluid in the body. It also stimulates muscle tightening (contraction) and helps nerves work properly. Normally, most of the body's potassium is inside cells, and only a very small amount is in the blood. Because the amount in the blood is so small, minor changes to potassium levels in the blood can be life-threatening. What are the causes? This condition may be caused by:  Antibiotic medicine.  Diarrhea or vomiting. Taking too much of a medicine that helps you have a bowel movement (laxative) can cause diarrhea and lead to hypokalemia.  Chronic kidney disease (CKD).  Medicines that help the body get rid of excess fluid (diuretics).  Eating disorders, such as bulimia.  Low magnesium levels in the body.  Sweating a lot. What are the signs or symptoms? Symptoms of this condition include:  Weakness.  Constipation.  Fatigue.  Muscle cramps.  Mental confusion.  Skipped heartbeats or irregular heartbeat (palpitations).  Tingling or numbness. How is this diagnosed? This condition is diagnosed with a blood test. How is this treated? This condition may be treated by:  Taking potassium supplements by mouth.  Adjusting the medicines that you take.  Eating more foods that contain a lot of potassium. If your potassium level is very low, you may need to get potassium through an IV and be monitored in the hospital. Follow these instructions at home:   Take over-the-counter and prescription medicines only as told by your health care provider. This includes vitamins and supplements.  Eat a healthy diet. A healthy diet includes fresh fruits and vegetables, whole grains, healthy fats, and lean proteins.  If instructed, eat more foods that contain a lot of potassium. This includes: ? Nuts, such as peanuts and pistachios.  ? Seeds, such as sunflower seeds and pumpkin seeds. ? Peas, lentils, and lima beans. ? Whole grain and bran cereals and breads. ? Fresh fruits and vegetables, such as apricots, avocado, bananas, cantaloupe, kiwi, oranges, tomatoes, asparagus, and potatoes. ? Orange juice. ? Tomato juice. ? Red meats. ? Yogurt.  Keep all follow-up visits as told by your health care provider. This is important. Contact a health care provider if you:  Have weakness that gets worse.  Feel your heart pounding or racing.  Vomit.  Have diarrhea.  Have diabetes (diabetes mellitus) and you have trouble keeping your blood sugar (glucose) in your target range. Get help right away if you:  Have chest pain.  Have shortness of breath.  Have vomiting or diarrhea that lasts for more than 2 days.  Faint. Summary  Hypokalemia means that the amount of potassium in the blood is lower than normal.  This condition is diagnosed with a blood test.  Hypokalemia may be treated by taking potassium supplements, adjusting the medicines that you take, or eating more foods that are high in potassium.  If your potassium level is very low, you may need to get potassium through an IV and be monitored in the hospital. This information is not intended to replace advice given to you by your health care provider. Make sure you discuss any questions you have with your health care provider. Document Released: 02/28/2005 Document Revised: 10/11/2017 Document Reviewed: 10/11/2017 Elsevier Patient Education  [place].    Hypomagnesemia Hypomagnesemia is a condition in which the level of magnesium in the blood is low. Magnesium is a mineral that is  found in many foods. It is used in many different processes in the body. Hypomagnesemia can affect every organ in the body. In severe cases, it can cause life-threatening problems. What are the causes? This condition may be caused by:  Not getting enough magnesium in your  diet.  Malnutrition.  Problems with absorbing magnesium from the intestines.  Dehydration.  Alcohol abuse.  Vomiting.  Severe or chronic diarrhea.  Some medicines, including medicines that make you urinate more (diuretics).  Certain diseases, such as kidney disease, diabetes, celiac disease, and overactive thyroid. What are the signs or symptoms? Symptoms of this condition include:  Loss of appetite.  Nausea and vomiting.  Involuntary shaking or trembling of a body part (tremor).  Muscle weakness.  Tingling in the arms and legs.  Sudden tightening of muscles (muscle spasms).  Confusion.  Psychiatric issues, such as depression, irritability, or psychosis.  A feeling of fluttering of the heart.  Seizures. These symptoms are more severe if magnesium levels drop suddenly. How is this diagnosed? This condition may be diagnosed based on:  Your symptoms and medical history.  A physical exam.  Blood and urine tests. How is this treated? Treatment depends on the cause and the severity of the condition. It may be treated with:  A magnesium supplement. This can be taken in pill form. If the condition is severe, magnesium is usually given through an IV.  Changes to your diet. You may be directed to eat foods that have a lot of magnesium, such as green leafy vegetables, peas, beans, and nuts.  Stopping any intake of alcohol. Follow these instructions at home:      Make sure that your diet includes foods with magnesium. Foods that have a lot of magnesium in them include: ? Green leafy vegetables, such as spinach and broccoli. ? Beans and peas. ? Nuts and seeds, such as almonds and sunflower seeds. ? Whole grains, such as whole grain bread and fortified cereals.  Take magnesium supplements if your health care provider tells you to do that. Take them as directed.  Take over-the-counter and prescription medicines only as told by your health care provider.  Have  your magnesium levels monitored as told by your health care provider.  When you are active, drink fluids that contain electrolytes.  Avoid drinking alcohol.  Keep all follow-up visits as told by your health care provider. This is important. Contact a health care provider if:  You get worse instead of better.  Your symptoms return. Get help right away if you:  Develop severe muscle weakness.  Have trouble breathing.  Feel that your heart is racing. Summary  Hypomagnesemia is a condition in which the level of magnesium in the blood is low.  Hypomagnesemia can affect every organ in the body.  Treatment may include eating more foods that contain magnesium, taking magnesium supplements, and not drinking alcohol.  Have your magnesium levels monitored as told by your health care provider. This information is not intended to replace advice given to you by your health care provider. Make sure you discuss any questions you have with your health care provider. Document Released: 11/24/2004 Document Revised: 02/10/2017 Document Reviewed: 01/30/2017 Elsevier Patient Education  2020 [employer].

## 2018-12-25 NOTE — Progress Notes (Signed)
Infusion stopped at 1745 due to patient requiring transportation services from the [facility].

## 2018-12-25 NOTE — Progress Notes (Signed)
Pt to CT by WC at 1525.

## 2018-12-27 ENCOUNTER — Inpatient Hospital Stay: Payer: No Typology Code available for payment source | Admitting: Physician Assistant

## 2018-12-27 ENCOUNTER — Telehealth: Payer: Self-pay

## 2018-12-27 ENCOUNTER — Inpatient Hospital Stay: Payer: No Typology Code available for payment source

## 2018-12-27 ENCOUNTER — Telehealth: Payer: Self-pay | Admitting: Physician Assistant

## 2018-12-27 NOTE — Telephone Encounter (Signed)
Called pt per 10/15 sch message - unable to reach pt and unable to leave message. - sent message to [name] to let her know

## 2018-12-27 NOTE — Telephone Encounter (Signed)
Called patient, he has not shown up for MD/PA or treatment. He stated he forgot and is out of town. [name] PA aware.

## 2018-12-28 ENCOUNTER — Other Ambulatory Visit: Payer: Self-pay | Admitting: Physician Assistant

## 2018-12-28 DIAGNOSIS — C3491 Malignant neoplasm of unspecified part of right bronchus or lung: Secondary | ICD-10-CM

## 2018-12-31 ENCOUNTER — Inpatient Hospital Stay (HOSPITAL_BASED_OUTPATIENT_CLINIC_OR_DEPARTMENT_OTHER): Payer: No Typology Code available for payment source | Admitting: Physician Assistant

## 2018-12-31 ENCOUNTER — Other Ambulatory Visit: Payer: Self-pay

## 2018-12-31 ENCOUNTER — Inpatient Hospital Stay: Payer: No Typology Code available for payment source

## 2018-12-31 ENCOUNTER — Encounter: Payer: Self-pay | Admitting: Physician Assistant

## 2018-12-31 ENCOUNTER — Telehealth: Payer: Self-pay | Admitting: *Deleted

## 2018-12-31 VITALS — BP 126/66 | HR 87 | Temp 98.0°F | Resp 17 | Wt 206.5 lb

## 2018-12-31 DIAGNOSIS — E876 Hypokalemia: Secondary | ICD-10-CM

## 2018-12-31 DIAGNOSIS — C3491 Malignant neoplasm of unspecified part of right bronchus or lung: Secondary | ICD-10-CM

## 2018-12-31 LAB — CMP (CANCER CENTER ONLY)
ALT: 15 U/L (ref 0–44)
AST: 29 U/L (ref 15–41)
Albumin: 3.7 g/dL (ref 3.5–5.0)
Alkaline Phosphatase: 134 U/L — ABNORMAL HIGH (ref 38–126)
Anion gap: 13 (ref 5–15)
BUN: 7 mg/dL (ref 6–20)
CO2: 26 mmol/L (ref 22–32)
Calcium: 8.6 mg/dL — ABNORMAL LOW (ref 8.9–10.3)
Chloride: 102 mmol/L (ref 98–111)
Creatinine: 0.84 mg/dL (ref 0.61–1.24)
GFR, Est AFR Am: >60 mL/min (ref >60)
GFR, Estimated: >60 mL/min (ref >60)
Glucose, Bld: 100 mg/dL — ABNORMAL HIGH (ref 70–99)
Potassium: 3.1 mmol/L — ABNORMAL LOW (ref 3.5–5.1)
Sodium: 141 mmol/L (ref 135–145)
Total Bilirubin: 0.8 mg/dL (ref 0.3–1.2)
Total Protein: 7.2 g/dL (ref 6.5–8.1)

## 2018-12-31 LAB — CBC WITH DIFFERENTIAL (CANCER CENTER ONLY)
Abs Immature Granulocytes: 0.01 10*3/uL (ref 0.00–0.07)
Basophils Absolute: 0.1 10*3/uL (ref 0.0–0.1)
Basophils Relative: 1 %
Eosinophils Absolute: 0.3 10*3/uL (ref 0.0–0.5)
Eosinophils Relative: 5 %
HCT: 43.2 % (ref 39.0–52.0)
Hemoglobin: 15 g/dL (ref 13.0–17.0)
Immature Granulocytes: 0 %
Lymphocytes Relative: 22 %
Lymphs Abs: 1.4 10*3/uL (ref 0.7–4.0)
MCH: 36.1 pg — ABNORMAL HIGH (ref 26.0–34.0)
MCHC: 34.7 g/dL (ref 30.0–36.0)
MCV: 104.1 fL — ABNORMAL HIGH (ref 80.0–100.0)
Monocytes Absolute: 0.7 10*3/uL (ref 0.1–1.0)
Monocytes Relative: 10 %
Neutro Abs: 4 10*3/uL (ref 1.7–7.7)
Neutrophils Relative %: 62 %
Platelet Count: 154 10*3/uL (ref 150–400)
RBC: 4.15 MIL/uL — ABNORMAL LOW (ref 4.22–5.81)
RDW: 13.8 % (ref 11.5–15.5)
WBC Count: 6.4 10*3/uL (ref 4.0–10.5)
nRBC: 0 % (ref 0.0–0.2)

## 2018-12-31 LAB — MAGNESIUM: Magnesium: 1.2 mg/dL — CL (ref 1.7–2.4)

## 2018-12-31 MED ORDER — POTASSIUM CHLORIDE ER 20 MEQ PO TBCR: 20.0000 meq | EXTENDED_RELEASE_TABLET | Freq: Every day | ORAL | 0 refills | Status: DC

## 2018-12-31 MED ORDER — SODIUM CHLORIDE 0.9 % IV SOLN
INTRAVENOUS | Status: DC
  Administered 2018-12-31: 13:00:00 via INTRAVENOUS
  Filled 2018-12-31: qty 250

## 2018-12-31 MED ORDER — SODIUM CHLORIDE 0.9 % IV SOLN
Freq: Once | INTRAVENOUS | Status: AC
  Administered 2018-12-31: 12:00:00 via INTRAVENOUS
  Filled 2018-12-31: qty 1000

## 2018-12-31 MED ORDER — MAGNESIUM SULFATE 2 GM/50ML IV SOLN
2.0000 g | INTRAVENOUS | Status: AC
  Administered 2018-12-31 (×3): 2 g via INTRAVENOUS
  Filled 2018-12-31: qty 50

## 2018-12-31 MED FILL — POTASSIUM CHLORIDE CRYS ER: 20 | 10 days supply | Qty: 10 | Fill #0

## 2018-12-31 NOTE — Patient Instructions (Signed)

## 2018-12-31 NOTE — Telephone Encounter (Signed)
Spoke with pharmacist at [facility] and they are unable to fill prescriptions due to [facility] coverage. [place] and unable to provide any information but stated that they do mail deliver all medications and the faxed prescriptions just need to be marked for mail delivery. Spoke with the patient and he confirmed that he does receive medications via the mail from the [place]. [name] PA made aware and new prescription for K+ was faxed to the [facility] pharmacy for mail delivery. Patient verbalized understanding.

## 2018-12-31 NOTE — Patient Instructions (Signed)

## 2018-12-31 NOTE — Progress Notes (Signed)
Adding mag sulfate 6g today per [place], PA

## 2018-12-31 NOTE — Telephone Encounter (Signed)
CRITICAL VALUE STICKER  CRITICAL VALUE: Mg = 1.2.  RECEIVER (on-site recipient of call): Mining engineer, [place].   DATE & TIME NOTIFIED: 12/31/2018 at 1203.   MESSENGER (representative from lab): [employer].  MD NOTIFIED: Collaborative for [place] PA-C.  TIME OF NOTIFICATION: 12/31/2018 at 1220.  RESPONSE: None.

## 2018-12-31 NOTE — Progress Notes (Signed)
Daniel Bryant  DIAGNOSIS: Stage IV (T1b, N2, M1) non-small cell lung cancer, adenocarcinoma diagnosed in January 2020. He presented with right upper lobe lung nodule in addition to mediastinal lymphadenopathy and malignant right pleural effusion.  PRIOR THERAPY: Systemic chemotherapy with carboplatin for AUC of 5, Alimta 500 mg/M2 and Keytruda 200 mg IV every 3 weeks status post 4 cycles. Last dose was given on 10/16/2018.[name] was discontinued starting from cycle #2 because of significant skin rash as well as diarrhea. His dose of carboplatin was also reduced to AUC of 5 and Alimta to 400 mg/M2 starting from cycle #3 secondary to intolerance.   CURRENT THERAPY: Observation.   INTERVAL HISTORY: Daniel Bryant 58 y.o. male returns to the clinic for a follow up visit. The patient had missed several appointments over the last few months secondary to his memory issues and to being in the hospital at the end of August 2020. He lives in a [place] community for [facility]. He is established with our transportation program since the patient is unable to drive. He states that he is unable to drive because once he is in the car, he cannot remember where he is going. He has not had any chemotherapy since August 4th, 2020 since he has missed all of his rescheduled appointments.   Today, the patient is feeling well without any concerning complaints. He denies any fever, chills, night sweats, or weight loss. He denies any vomiting, diarrhea, or constipation. He states that sometimes he feels nauseous at night. He has reflux and takes Protonix. He denies any headaches or visual changes. He denies any chest pain, shortness of breath, cough, or hemoptysis. He had hypokalemia which required IV potassium the last two weeks. He also was sent a prescription of oral potassium. He believes that he is  taking them as prescribed. He recently had a restaging CT scan performed. He is here today for evaluation and to review his CT scan and discuss his treatment options.     MEDICAL HISTORY: Past Medical History:  Diagnosis Date  . Asthma   . Cancer ([place])   . COPD (chronic obstructive pulmonary disease) ([place])   . GERD (gastroesophageal reflux disease)   . High cholesterol   . History of petit-mal seizures   . Hyperlipidemia   . Hypertension   . Memory impairment   . OSA (obstructive sleep apnea)    Does not tolerate CPAP  . Pacemaker   . PTSD (post-traumatic stress disorder)   . Sick sinus syndrome ([facility])     ALLERGIES:  is allergic to other.  MEDICATIONS:  Current Outpatient Medications  Medication Sig Dispense Refill  . albuterol (PROVENTIL) (2.5 MG/3ML) 0.083% nebulizer solution Take 3 mLs (2.5 mg total) by nebulization 2 (two) times daily as needed for wheezing or shortness of breath. 75 mL 0  . Amino Acids-Protein Hydrolys (FEEDING SUPPLEMENT, PRO-STAT SUGAR FREE 64,) LIQD Take 30 mLs by mouth 2 (two) times daily. 887 mL 0  . atorvastatin (LIPITOR) 40 MG tablet Take 40 mg by mouth daily.     Marland Kitchen escitalopram (LEXAPRO) 20 MG tablet Take 20 mg by mouth daily.     . famotidine (PEPCID) 20 MG tablet Take 1 tablet (20 mg total) by mouth daily. 30 tablet 0  . folic acid (FOLVITE) 1 MG tablet Take 1 tablet (1 mg total) by mouth daily. 30 tablet 4  . lisinopril (ZESTRIL) 5  MG tablet Take 5 mg by mouth daily.    . Multiple Vitamin (MULTIVITAMIN WITH MINERALS) TABS tablet Take 1 tablet by mouth daily. 30 tablet 0  . aspirin EC 81 MG EC tablet Take 1 tablet (81 mg total) by mouth daily. (Patient not taking: Reported on 11/05/2018)    . cholestyramine (QUESTRAN) 4 g packet Take 1 packet (4 g total) by mouth 2 (two) times daily. (Patient not taking: Reported on 12/31/2018) 10 each 0  . diphenhydrAMINE (BENADRYL) 25 MG tablet Take 1 tablet (25 mg total) by mouth every 6 (six) hours as needed for  itching or allergies. (Patient not taking: Reported on 12/31/2018) 20 tablet 0  . diphenoxylate-atropine (LOMOTIL) 2.5-0.025 MG tablet Take 1 tablet by mouth 4 (four) times daily as needed for diarrhea or loose stools. (Patient not taking: Reported on 12/31/2018) 30 tablet 0  . lidocaine (XYLOCAINE) 2 % solution Use as directed 5 mLs in the mouth or throat every 3 (three) hours as needed for mouth pain. Gargle and spit 200 mL 1  . Potassium Chloride ER 20 MEQ TBCR Take 20 mEq by mouth daily. 10 tablet 0  . prochlorperazine (COMPAZINE) 10 MG tablet Take 1 tablet (10 mg total) by mouth every 6 (six) hours as needed for nausea or vomiting. (Patient not taking: Reported on 12/31/2018) 30 tablet 0   No current facility-administered medications for this visit.    Facility-Administered Medications Ordered in Other Visits  Medication Dose Route Frequency Provider Last Rate Last Dose  . 0.9 %  sodium chloride infusion   Intravenous Continuous Heilingoetter, Cassandra L, PA-C 20 mL/hr at 12/31/18 1251    . magnesium sulfate IVPB 2 g 50 mL  2 g Intravenous Q1H Heilingoetter, Cassandra L, PA-C 50 mL/hr at 12/31/18 1252 2 g at 12/31/18 1252    SURGICAL HISTORY:  Past Surgical History:  Procedure Laterality Date  . CARDIAC SURGERY    . CHEST TUBE INSERTION Right 05/16/2018   Procedure: INSERTION PLEURAL DRAINAGE CATHETER;  Surgeon: Ivin Poot, MD;  Location: [facility];  Service: Thoracic;  Laterality: Right;  . CHEST TUBE INSERTION Right 05/16/2018   Procedure: Chest Tube Insertion;  Surgeon: Ivin Poot, MD;  Location: [facility];  Service: Thoracic;  Laterality: Right;  . PACEMAKER INSERTION    . REMOVAL OF PLEURAL DRAINAGE CATHETER Right 08/30/2018   Procedure: REMOVAL OF PLEURAL DRAINAGE CATHETER;  Surgeon: Ivin Poot, MD;  Location: [facility];  Service: Thoracic;  Laterality: Right;    REVIEW OF SYSTEMS:   Review of Systems  Constitutional: Negative for appetite change, chills, fatigue, fever and  unexpected weight change.  HENT: Negative for mouth sores, nosebleeds, sore throat and trouble swallowing.   Eyes: Negative for eye problems and icterus.  Respiratory: Negative for cough, hemoptysis, shortness of breath and wheezing.   Cardiovascular: Negative for chest pain and leg swelling.  Gastrointestinal: Positive for occasional nausea. Negative for abdominal pain, constipation, diarrhea, nausea and vomiting.  Genitourinary: Negative for bladder incontinence, difficulty urinating, dysuria, frequency and hematuria.   Musculoskeletal: Negative for back pain, gait problem, neck pain and neck stiffness.  Skin: Negative for itching and rash.  Neurological: Negative for dizziness, extremity weakness, gait problem, headaches, light-headedness and seizures.  Hematological: Negative for adenopathy. Does not bruise/bleed easily.  Psychiatric/Behavioral: Positive for memory issues. Negative depression and sleep disturbance. The patient is not nervous/anxious.     PHYSICAL EXAMINATION:  Blood pressure 126/66, pulse 87, temperature 98 F (36.7 C), temperature source Tympanic,  resp. rate 17, weight 206 lb 8 oz (93.7 kg), SpO2 100 %.  ECOG PERFORMANCE STATUS: 1 - Symptomatic but completely ambulatory  Physical Exam  Constitutional: Oriented to person, place, and time and well-developed, well-nourished, and in no distress.  HENT:  Head: Normocephalic and atraumatic.  Mouth/Throat: Oropharynx is clear and moist. No oropharyngeal exudate.  Eyes: Conjunctivae are normal. Right eye exhibits no discharge. Left eye exhibits no discharge. No scleral icterus.  Neck: Normal range of motion. Neck supple.  Cardiovascular: Normal rate, regular rhythm, normal heart sounds and intact distal pulses.   Pulmonary/Chest: Effort normal and breath sounds normal. No respiratory distress. No wheezes. No rales.  Abdominal: Soft. Bowel sounds are normal. Exhibits no distension and no mass. There is no tenderness.   Musculoskeletal: Normal range of motion. Exhibits no edema.  Lymphadenopathy:    No cervical adenopathy.  Neurological: Alert and oriented to person, place, and time. Exhibits normal muscle tone. Gait normal. Coordination normal.  Skin: Skin is warm and dry. No rash noted. Not diaphoretic. No erythema. No pallor.  Psychiatric: Positive for memory issues. Mood normal.  Vitals reviewed.  LABORATORY DATA: Lab Results  Component Value Date   WBC 6.4 12/31/2018   HGB 15.0 12/31/2018   HCT 43.2 12/31/2018   MCV 104.1 (H) 12/31/2018   PLT 154 12/31/2018      Chemistry      Component Value Date/Time   NA 141 12/31/2018 1020   K 3.1 (L) 12/31/2018 1020   CL 102 12/31/2018 1020   CO2 26 12/31/2018 1020   BUN 7 12/31/2018 1020   CREATININE 0.84 12/31/2018 1020      Component Value Date/Time   CALCIUM 8.6 (L) 12/31/2018 1020   ALKPHOS 134 (H) 12/31/2018 1020   AST 29 12/31/2018 1020   ALT 15 12/31/2018 1020   BILITOT 0.8 12/31/2018 1020       RADIOGRAPHIC STUDIES:  Ct Chest W Contrast  Result Date: 12/26/2018 CLINICAL DATA:  Lung cancer. Restaging. EXAM: CT CHEST, ABDOMEN, AND PELVIS WITH CONTRAST TECHNIQUE: Multidetector CT imaging of the chest, abdomen and pelvis was performed following the standard protocol during bolus administration of intravenous contrast. CONTRAST:  147mL OMNIPAQUE IOHEXOL 300 MG/ML  SOLN COMPARISON:  CT chest 10/23/2018. Abdomen pelvis CT 11/06/2018. FINDINGS: CT CHEST FINDINGS Cardiovascular: The heart size is normal. No substantial pericardial effusion. Coronary artery calcification is evident. Right-sided permanent pacemaker again noted. Mediastinum/Nodes: Persistent mediastinal lymphadenopathy. 1.6 cm short axis right paratracheal lymph node was 1.7 cm previously. Right hilar lymph node measured previously at 1.9 cm is now 1.6 cm. The prevascular node measured previously at 1.0 cm short axis is now 1.1 cm (29/2). Previously measured bulky right  pericardial phrenic adenopathy is 4.8 cm short axis diameter today compared to 5.0 cm previously (48/2). No left hilar lymphadenopathy. The esophagus has normal imaging features. There is no axillary lymphadenopathy. Lungs/Pleura: The nodular, irregular soft tissue identified in the right pleural space is similar to prior. Index measurements obtained previously are repeated today. Medial right apical pleural space was measured previously at 9 mm which compares to 8 mm today (11/2). Right perifissural nodule measured previously at 2 cm is 1.9 cm today. Other perifissural nodularity in the right lung is similar including 2.8 cm nodule along the major fissure (89/6) previously measured at 2.9 cm. Small right pleural effusion is similar with areas of loculation again noted. Posterior right lower lobe pulmonary nodule measured previously at 9 mm is 9 mm again today (124/6) additional  tiny right pulmonary nodules are similar. Tiny left pulmonary nodules are stable with no suspicious pulmonary nodule or mass in the left lung. No left pleural effusion. Musculoskeletal: No worrisome lytic or sclerotic osseous abnormality. CT ABDOMEN PELVIS FINDINGS Hepatobiliary: Similar appearance 2.2 cm homogeneous well-defined low-density lesion in the dome of liver, likely a cyst. There is no evidence for gallstones, gallbladder wall thickening, or pericholecystic fluid. No intrahepatic or extrahepatic biliary dilation. Pancreas: No focal mass lesion. No dilatation of the main duct. No intraparenchymal cyst. No peripancreatic edema. Spleen: No splenomegaly. No focal mass lesion. Adrenals/Urinary Tract: No adrenal nodule or mass. Kidneys unremarkable. No evidence for hydroureter. The urinary bladder appears normal for the degree of distention. Stomach/Bowel: Mild circumferential wall thickening noted distal esophagus. Stomach is unremarkable. No gastric wall thickening. No evidence of outlet obstruction. Duodenum is normally positioned as  is the ligament of Treitz. No small bowel wall thickening. No small bowel dilatation. The terminal ileum is normal. The appendix is normal. No gross colonic mass. No colonic wall thickening. Vascular/Lymphatic: There is abdominal aortic atherosclerosis without aneurysm. There is no gastrohepatic or hepatoduodenal ligament lymphadenopathy. No intraperitoneal or retroperitoneal lymphadenopathy. No pelvic sidewall lymphadenopathy. Reproductive: The prostate gland and seminal vesicles are unremarkable. Other: No intraperitoneal free fluid. Musculoskeletal: No worrisome lytic or sclerotic osseous abnormality. Tiny sclerotic foci in the left inferior pubic ramus and right acetabulum are stable, likely bone islands. Similar appearances tiny sclerotic foci in the lumbar spine. IMPRESSION: 1. No substantial interval change exam. 2. No substantial change in the nodular, irregular soft tissue in the right pleural space with associated small right pleural effusion. 3. Similar appearance of right hilar, prevascular, pericardiophrenic and mediastinal lymphadenopathy. 4. No evidence for metastatic disease in the abdomen or pelvis. 5. Coronary artery and thoracoabdominal aortic atherosclerosis. 6. Mild circumferential wall thickening distal esophagus. Esophagitis could have this appearance. 7. Stable appearance 2.2 cm low-density lesion in the dome of liver, likely a cyst. Aortic Atherosclerosis (ICD10-I70.0). Electronically Signed   By: Misty Stanley M.D.   On: 12/26/2018 09:00   Ct Abdomen Pelvis W Contrast  Result Date: 12/26/2018 CLINICAL DATA:  Lung cancer. Restaging. EXAM: CT CHEST, ABDOMEN, AND PELVIS WITH CONTRAST TECHNIQUE: Multidetector CT imaging of the chest, abdomen and pelvis was performed following the standard protocol during bolus administration of intravenous contrast. CONTRAST:  152mL OMNIPAQUE IOHEXOL 300 MG/ML  SOLN COMPARISON:  CT chest 10/23/2018. Abdomen pelvis CT 11/06/2018. FINDINGS: CT CHEST FINDINGS  Cardiovascular: The heart size is normal. No substantial pericardial effusion. Coronary artery calcification is evident. Right-sided permanent pacemaker again noted. Mediastinum/Nodes: Persistent mediastinal lymphadenopathy. 1.6 cm short axis right paratracheal lymph node was 1.7 cm previously. Right hilar lymph node measured previously at 1.9 cm is now 1.6 cm. The prevascular node measured previously at 1.0 cm short axis is now 1.1 cm (29/2). Previously measured bulky right pericardial phrenic adenopathy is 4.8 cm short axis diameter today compared to 5.0 cm previously (48/2). No left hilar lymphadenopathy. The esophagus has normal imaging features. There is no axillary lymphadenopathy. Lungs/Pleura: The nodular, irregular soft tissue identified in the right pleural space is similar to prior. Index measurements obtained previously are repeated today. Medial right apical pleural space was measured previously at 9 mm which compares to 8 mm today (11/2). Right perifissural nodule measured previously at 2 cm is 1.9 cm today. Other perifissural nodularity in the right lung is similar including 2.8 cm nodule along the major fissure (89/6) previously measured at 2.9 cm. Small right pleural  effusion is similar with areas of loculation again noted. Posterior right lower lobe pulmonary nodule measured previously at 9 mm is 9 mm again today (124/6) additional tiny right pulmonary nodules are similar. Tiny left pulmonary nodules are stable with no suspicious pulmonary nodule or mass in the left lung. No left pleural effusion. Musculoskeletal: No worrisome lytic or sclerotic osseous abnormality. CT ABDOMEN PELVIS FINDINGS Hepatobiliary: Similar appearance 2.2 cm homogeneous well-defined low-density lesion in the dome of liver, likely a cyst. There is no evidence for gallstones, gallbladder wall thickening, or pericholecystic fluid. No intrahepatic or extrahepatic biliary dilation. Pancreas: No focal mass lesion. No dilatation  of the main duct. No intraparenchymal cyst. No peripancreatic edema. Spleen: No splenomegaly. No focal mass lesion. Adrenals/Urinary Tract: No adrenal nodule or mass. Kidneys unremarkable. No evidence for hydroureter. The urinary bladder appears normal for the degree of distention. Stomach/Bowel: Mild circumferential wall thickening noted distal esophagus. Stomach is unremarkable. No gastric wall thickening. No evidence of outlet obstruction. Duodenum is normally positioned as is the ligament of Treitz. No small bowel wall thickening. No small bowel dilatation. The terminal ileum is normal. The appendix is normal. No gross colonic mass. No colonic wall thickening. Vascular/Lymphatic: There is abdominal aortic atherosclerosis without aneurysm. There is no gastrohepatic or hepatoduodenal ligament lymphadenopathy. No intraperitoneal or retroperitoneal lymphadenopathy. No pelvic sidewall lymphadenopathy. Reproductive: The prostate gland and seminal vesicles are unremarkable. Other: No intraperitoneal free fluid. Musculoskeletal: No worrisome lytic or sclerotic osseous abnormality. Tiny sclerotic foci in the left inferior pubic ramus and right acetabulum are stable, likely bone islands. Similar appearances tiny sclerotic foci in the lumbar spine. IMPRESSION: 1. No substantial interval change exam. 2. No substantial change in the nodular, irregular soft tissue in the right pleural space with associated small right pleural effusion. 3. Similar appearance of right hilar, prevascular, pericardiophrenic and mediastinal lymphadenopathy. 4. No evidence for metastatic disease in the abdomen or pelvis. 5. Coronary artery and thoracoabdominal aortic atherosclerosis. 6. Mild circumferential wall thickening distal esophagus. Esophagitis could have this appearance. 7. Stable appearance 2.2 cm low-density lesion in the dome of liver, likely a cyst. Aortic Atherosclerosis (ICD10-I70.0). Electronically Signed   By: Misty Stanley M.D.    On: 12/26/2018 09:00     ASSESSMENT/PLAN:  This is a very pleasant 58 year old Caucasian male with metastatic non-small cell lung cancer, adenocarcinoma.  He presented with a right upper lobe lung mass with mediastinal lymphadenopathy and a right malignant pleural effusion.  He was diagnosed in January 2020.  The patient had underwent systemic chemotherapy with carboplatin for an AUC of 5, Alimta 500 mg/m2, and Keytruda 200 mg IV every 3 weeks.  The patient is status post 4 cycles of chemotherapy; however, [name] was discontinued after cycle #2 due to a significant skin rash.  His dose of carboplatin was scheduled to be reduced to AUC of 4 starting from cycle #5 as well as Alimta reduced to 400 mg/m due to concerns with tolerability and toxicity.  Patient's most recent treatment was on October 16, 2018.  The patient was in the hospital from November 05, 2018-August 31st, 2020 for viral gastritis. The patient missed several rescheduled appointments for chemotherapy following his hospitalization due to his memory issues.   The patient recently had a restaging CT scan. Dr. Julien Nordmann personally and independenly reviewed the scan and discussed the results with the patient. The scan shows stable disease.   Dr. Julien Nordmann had a lengthy discussion with the patient about his current condition and treatment options. Due to  the patient's memory issues and being noncompliant with his treatment and lab appointments, Dr. Julien Nordmann gave the patient the option of continuing on observation with close monitoring vs. Resuming treatment.   The patient is interested in taking a break from treatment with close monitoring. We will arrange for the patient to have a restaging CT scan in 3 months.   We will see the patient back for a follow up visit in 3 months for evaluation and to review his scan results.   The patient's magnesium and potassium are low again today. We will give the patient an additional 40 meq of potassium and 6g  of magnesium sulfate while in the clinic today. He also has a prescription of 20 meq p.o. daily of KCl sent to his pharmacy to take for 10 days until he can get an appointment with his PCP at the [facility] for close monitoring of his potassium. The patient was also given a handout on potassium rich food.   I will reach out to our transportation coordinator to arrange for the patient to receive transportation to his follow up appointment in 3 months.   The patient was advised to call immediately if he has any concerning symptoms in the interval. The patient voices understanding of current disease status and treatment options and is in agreement with the current care plan. All questions were answered. The patient knows to call the clinic with any problems, questions or concerns. We can certainly see the patient much sooner if necessary    Orders Placed This Encounter  Procedures  . CT Abdomen Pelvis W Contrast    Standing Status:   Future    Standing Expiration Date:   12/31/2019    Order Specific Question:   ** REASON FOR EXAM (FREE TEXT)    Answer:   Restaging CT scan Lung cancer    Order Specific Question:   If indicated for the ordered procedure, I authorize the administration of contrast media per Radiology protocol    Answer:   Yes    Order Specific Question:   Preferred imaging location?    Answer:   [facility]    Order Specific Question:   Is Oral Contrast requested for this exam?    Answer:   Yes, Per Radiology protocol    Order Specific Question:   Radiology Contrast Protocol - do NOT remove file path    Answer:   \\[email or web address]  . CT Chest W Contrast    Standing Status:   Future    Standing Expiration Date:   12/31/2019    Order Specific Question:   ** REASON FOR EXAM (FREE TEXT)    Answer:   Restaging Lung Cancer    Order Specific Question:   If indicated for the ordered procedure, I authorize the administration of contrast media per Radiology  protocol    Answer:   Yes    Order Specific Question:   Preferred imaging location?    Answer:   [facility]    Order Specific Question:   Radiology Contrast Protocol - do NOT remove file path    Answer:   \\[email or web address]  . CBC with Differential ([place] Only)    Standing Status:   Future    Standing Expiration Date:   12/31/2019  . CMP ([place] only)    Standing Status:   Future    Standing Expiration Date:   12/31/2019     Tobe Sos Heilingoetter, PA-C 12/31/18  ADDENDUM: Hematology/Oncology Attending: I  had a face-to-face encounter with the patient today.  I recommended his care plan.  This is a very pleasant 58 years old white male with metastatic non-small cell lung cancer, adenocarcinoma status post 4 cycles of systemic chemotherapy with carboplatin, Alimta and Keytruda.  [name] was discontinued after cycle #2 secondary to significant skin rash.  He has been tolerating his treatment well except for fatigue and weakness as well as persistent hypokalemia.  The patient was not compliant with his treatment and he missed several chemotherapy cycles. His last treatment was in August 2020. He came to the clinic today for evaluation with repeat CT scan of the chest, abdomen pelvis.  I personally and independently reviewed the scans and discussed the results with the patient today. His scan showed no concerning findings for disease progression. I have some concern about keeping the patient on chemotherapy especially with his noncompliance. He has a stable disease and no concerning findings for progression right now. I recommended for the patient to continue on observation with repeat CT scan of the chest, abdomen and pelvis in 3 months.  If he develop any disease progression in the future, will consider him for retreatment again. For the hypokalemia, will arrange for the patient to receive potassium chloride 40 mEq IV in the clinic today.   We will also give him prescription for potassium chloride orally 20 mEq p.o. daily for 10 days. The patient was advised to call immediately if he has any other concerning symptoms in the interval.  Disclaimer: This note was dictated with voice recognition software. Similar sounding words can inadvertently be transcribed and may be missed upon review. Eilleen Kempf, MD 12/31/18

## 2019-01-02 ENCOUNTER — Telehealth: Payer: Self-pay | Admitting: Internal Medicine

## 2019-01-02 NOTE — Telephone Encounter (Signed)
Scheduled appt per 10/19 los - mailed reminder letter with appt date and time

## 2019-02-20 ENCOUNTER — Encounter: Payer: Self-pay | Admitting: *Deleted

## 2019-03-13 ENCOUNTER — Encounter: Payer: Self-pay | Admitting: *Deleted

## 2019-03-29 ENCOUNTER — Inpatient Hospital Stay: Payer: No Typology Code available for payment source | Attending: Internal Medicine

## 2019-03-29 ENCOUNTER — Ambulatory Visit (HOSPITAL_COMMUNITY): Admission: RE | Admit: 2019-03-29 | Payer: No Typology Code available for payment source | Source: Ambulatory Visit

## 2019-03-29 DIAGNOSIS — E876 Hypokalemia: Secondary | ICD-10-CM | POA: Insufficient documentation

## 2019-03-29 DIAGNOSIS — C3411 Malignant neoplasm of upper lobe, right bronchus or lung: Secondary | ICD-10-CM | POA: Insufficient documentation

## 2019-04-01 ENCOUNTER — Telehealth: Payer: Self-pay | Admitting: *Deleted

## 2019-04-01 ENCOUNTER — Inpatient Hospital Stay: Payer: No Typology Code available for payment source | Admitting: Internal Medicine

## 2019-04-01 ENCOUNTER — Telehealth: Payer: Self-pay | Admitting: Internal Medicine

## 2019-04-01 NOTE — Telephone Encounter (Signed)
Scheduled appt per 1/18 sch message - unable to reach pt and unable to leave message - mailed reminder letter with appt date and time

## 2019-04-01 NOTE — Telephone Encounter (Signed)
TCT patient as he was scheduled to see Dr. Julien Nordmann today. But his CT scan is not until 04/04/19.  Spoke with patient and informed him that his appt with Dr. Julien Nordmann will be scheduled for after his scan that is scheduled for 04/04/19 Pt voiced understanding.  Scheduling message sent

## 2019-04-04 ENCOUNTER — Ambulatory Visit (HOSPITAL_COMMUNITY)
Admission: RE | Admit: 2019-04-04 | Discharge: 2019-04-04 | Disposition: A | Discharge status: Home / Self Care | Payer: No Typology Code available for payment source | Source: Ambulatory Visit | Attending: Physician Assistant | Admitting: Physician Assistant

## 2019-04-04 ENCOUNTER — Inpatient Hospital Stay: Payer: No Typology Code available for payment source

## 2019-04-04 ENCOUNTER — Other Ambulatory Visit: Payer: Self-pay | Admitting: Physician Assistant

## 2019-04-04 ENCOUNTER — Telehealth: Payer: Self-pay | Admitting: *Deleted

## 2019-04-04 ENCOUNTER — Telehealth: Payer: Self-pay

## 2019-04-04 ENCOUNTER — Telehealth: Payer: Self-pay | Admitting: Physician Assistant

## 2019-04-04 ENCOUNTER — Other Ambulatory Visit: Payer: Self-pay

## 2019-04-04 DIAGNOSIS — C3491 Malignant neoplasm of unspecified part of right bronchus or lung: Secondary | ICD-10-CM

## 2019-04-04 DIAGNOSIS — E876 Hypokalemia: Secondary | ICD-10-CM

## 2019-04-04 LAB — CBC WITH DIFFERENTIAL (CANCER CENTER ONLY)
Abs Immature Granulocytes: 0.03 10*3/uL (ref 0.00–0.07)
Basophils Absolute: 0.1 10*3/uL (ref 0.0–0.1)
Basophils Relative: 1 %
Eosinophils Absolute: 1 10*3/uL — ABNORMAL HIGH (ref 0.0–0.5)
Eosinophils Relative: 9 %
HCT: 49.6 % (ref 39.0–52.0)
Hemoglobin: 17.2 g/dL — ABNORMAL HIGH (ref 13.0–17.0)
Immature Granulocytes: 0 %
Lymphocytes Relative: 18 %
Lymphs Abs: 2.2 10*3/uL (ref 0.7–4.0)
MCH: 34.9 pg — ABNORMAL HIGH (ref 26.0–34.0)
MCHC: 34.7 g/dL (ref 30.0–36.0)
MCV: 100.6 fL — ABNORMAL HIGH (ref 80.0–100.0)
Monocytes Absolute: 0.9 10*3/uL (ref 0.1–1.0)
Monocytes Relative: 7 %
Neutro Abs: 7.7 10*3/uL (ref 1.7–7.7)
Neutrophils Relative %: 65 %
Platelet Count: 145 10*3/uL — ABNORMAL LOW (ref 150–400)
RBC: 4.93 MIL/uL (ref 4.22–5.81)
RDW: 14.5 % (ref 11.5–15.5)
WBC Count: 11.8 10*3/uL — ABNORMAL HIGH (ref 4.0–10.5)
nRBC: 0 % (ref 0.0–0.2)

## 2019-04-04 LAB — CMP (CANCER CENTER ONLY)
ALT: 20 U/L (ref 0–44)
AST: 31 U/L (ref 15–41)
Albumin: 3.5 g/dL (ref 3.5–5.0)
Alkaline Phosphatase: 113 U/L (ref 38–126)
Anion gap: 13 (ref 5–15)
BUN: 8 mg/dL (ref 8–23)
CO2: 30 mmol/L (ref 22–32)
Calcium: 8.2 mg/dL — ABNORMAL LOW (ref 8.9–10.3)
Chloride: 100 mmol/L (ref 98–111)
Creatinine: 0.95 mg/dL (ref 0.61–1.24)
GFR, Est AFR Am: >60 mL/min (ref >60)
GFR, Estimated: >60 mL/min (ref >60)
Glucose, Bld: 108 mg/dL — ABNORMAL HIGH (ref 70–99)
Potassium: 3 mmol/L — CL (ref 3.5–5.1)
Sodium: 143 mmol/L (ref 135–145)
Total Bilirubin: 1.2 mg/dL (ref 0.3–1.2)
Total Protein: 7.4 g/dL (ref 6.5–8.1)

## 2019-04-04 MED ORDER — SODIUM CHLORIDE (PF) 0.9 % IJ SOLN
INTRAMUSCULAR | Status: AC
  Filled 2019-04-04: qty 50

## 2019-04-04 MED ORDER — POTASSIUM CHLORIDE ER 20 MEQ PO TBCR: 20.0000 meq | EXTENDED_RELEASE_TABLET | Freq: Every day | ORAL | 0 refills | Status: DC

## 2019-04-04 MED ORDER — IOHEXOL 300 MG/ML  SOLN
75.0000 mL | Freq: Once | INTRAMUSCULAR | Status: AC | PRN
  Administered 2019-04-04: 16:00:00 75 mL via INTRAVENOUS

## 2019-04-04 MED FILL — POTASSIUM CHLORIDE CRYS ER: 20 | 7 days supply | Qty: 7 | Fill #0

## 2019-04-04 NOTE — Telephone Encounter (Signed)
Called patient regarding potassium level and new rx that was called into pharmacy.  He is asking if the Rx could be changed to [facility] outpatient.  Reordered to [facility] outpatient pharmacy.

## 2019-04-04 NOTE — Telephone Encounter (Signed)
CRITICAL VALUE STICKER  CRITICAL VALUE: Potassium  RECEIVER (on-site recipient of call):  Rhae Hammock, RN  DATE & TIME NOTIFIED: 04/04/2019 @ 3:06 pm     MESSENGER (representative from lab):  Lurlean Horns  MD NOTIFIED: Vito Backers Heilingoetter, PA  TIME OF NOTIFICATION: 03:10 pm  RESPONSE: Pending direction.  Patient has had previously low potassium and previous rx back in October for Potassium oral supplement.

## 2019-04-05 NOTE — Progress Notes (Unsigned)
Late entry -  Verbally communicated with repeat back to K. Tamala Julian, RN @ [phone].  K+ 3.0

## 2019-04-09 ENCOUNTER — Inpatient Hospital Stay: Payer: No Typology Code available for payment source | Admitting: Internal Medicine

## 2019-04-09 ENCOUNTER — Telehealth: Payer: Self-pay | Admitting: *Deleted

## 2019-04-09 NOTE — Telephone Encounter (Signed)
Called pt in response to missed appt today.  He states he forgot & didn't have it written down. Discussed with Dr Julien Nordmann & he suggest pt see [name] this Fri.  Pt states he can be here @ 10 am.  Informed Ebony Hail with transportation who states that they will contact pt & make arrangements.

## 2019-04-12 ENCOUNTER — Inpatient Hospital Stay: Payer: No Typology Code available for payment source

## 2019-04-12 ENCOUNTER — Other Ambulatory Visit: Payer: Self-pay | Admitting: Physician Assistant

## 2019-04-12 ENCOUNTER — Inpatient Hospital Stay (HOSPITAL_BASED_OUTPATIENT_CLINIC_OR_DEPARTMENT_OTHER): Payer: No Typology Code available for payment source | Admitting: Physician Assistant

## 2019-04-12 ENCOUNTER — Other Ambulatory Visit: Payer: Self-pay

## 2019-04-12 ENCOUNTER — Encounter: Payer: Self-pay | Admitting: Physician Assistant

## 2019-04-12 VITALS — BP 172/105 | HR 99 | Temp 98.0°F | Resp 17 | Ht 72.0 in | Wt 221.1 lb

## 2019-04-12 DIAGNOSIS — E876 Hypokalemia: Secondary | ICD-10-CM | POA: Diagnosis not present

## 2019-04-12 DIAGNOSIS — Z7189 Other specified counseling: Secondary | ICD-10-CM

## 2019-04-12 DIAGNOSIS — C3491 Malignant neoplasm of unspecified part of right bronchus or lung: Secondary | ICD-10-CM

## 2019-04-12 DIAGNOSIS — C3411 Malignant neoplasm of upper lobe, right bronchus or lung: Secondary | ICD-10-CM | POA: Diagnosis not present

## 2019-04-12 LAB — BASIC METABOLIC PANEL - CANCER CENTER ONLY
Anion gap: 8 (ref 5–15)
BUN: 10 mg/dL (ref 8–23)
CO2: 30 mmol/L (ref 22–32)
Calcium: 9 mg/dL (ref 8.9–10.3)
Chloride: 100 mmol/L (ref 98–111)
Creatinine: 1.09 mg/dL (ref 0.61–1.24)
GFR, Est AFR Am: >60 mL/min (ref >60)
GFR, Estimated: >60 mL/min (ref >60)
Glucose, Bld: 99 mg/dL (ref 70–99)
Potassium: 3.6 mmol/L (ref 3.5–5.1)
Sodium: 138 mmol/L (ref 135–145)

## 2019-04-12 LAB — MAGNESIUM: Magnesium: 1.6 mg/dL — ABNORMAL LOW (ref 1.7–2.4)

## 2019-04-12 MED ORDER — DEXAMETHASONE 4 MG PO TABS: ORAL_TABLET | ORAL | 2 refills | Status: DC

## 2019-04-12 MED ORDER — FOLIC ACID 1 MG PO TABS: 1.0000 mg | ORAL_TABLET | Freq: Every day | ORAL | 3 refills | Status: AC

## 2019-04-12 NOTE — Progress Notes (Signed)
Daniel [place] [address]  DIAGNOSIS: Stage IV (T1b, N2, M1) non-small cell lung cancer, adenocarcinoma diagnosed in January 2020. He presented with right upper lobe lung nodule in addition to mediastinal lymphadenopathy and malignant right pleural effusion.  PRIOR THERAPY: Systemic chemotherapy with carboplatin for AUC of 5, Alimta 500 mg/M2 and Keytruda 200 mg IV every 3 weeks status post4cycles. Last dose was given on 10/16/2018.[name] was discontinued starting from cycle #2 because of significant skin rash as well as diarrhea. His dose of carboplatin was also reduced to AUC of 5 and Alimta to 400 mg/M2 starting from cycle #3 secondary to intolerance.  CURRENT THERAPY: Resuming treatment with Carboplatin for an AUC of 4 and Alimta 400 mg/m IV every 3 weeks.  Starting 04/18/19  INTERVAL HISTORY: Daniel Bryant 59 y.o. male returns to the clinic for a follow-up visit.  The patient has missed several appointments in the past secondary to having short-term memory issues.  He lives in a tiny home community for veterans.  He is established with a transportation program since the patient is unable to drive.  The patient states that when he gets in a car to drive, he cannot remember where he is going.  To the patient missing several appointments in the past and had several weeks of gaps in treatment, the patient opted to have a 63-month break from chemotherapy.  Today, the patient is feeling fairly well.  He denies any recent fever, chills, or weight loss.  He reports occasional night sweats.  He denies any shortness of breath, cough, or hemoptysis but states that since he was first diagnosed with lung cancer he does not feel like he can take his deep of breath as he used to be. He states that he sometimes has a mild dull ache bilaterally in his chest which does not require any medications to  alleviate his symptoms.  The patient denies any diarrhea or constipation.  Patient states that he sometimes feels nauseous in the morning after he takes his medications.  He denies any headaches or visual changes at this time; however, the patient has uncontrolled hypertension secondary to forgetting to take his medications.  In the past, he has experienced tinnitus, visual changes, and changes in his equilibrium and he states that he knows this is related to his blood pressure being too high.  During these instances, he states that he has called an ambulance. With regard to taking his medications, he was given a prescription last week for KCl due to hypokalemia on labs. He states he did not get his prescription in the mail until yesterday so he had not been taking it. The patient recently had a restaging CT scan performed.  Due to insurance his CT of his abdomen and pelvis was not approved.  He is here today for evaluation and to review his scan results and further discussion about management of his condition.  MEDICAL HISTORY: Past Medical History:  Diagnosis Date  . Asthma   . Cancer ([place])   . COPD (chronic obstructive pulmonary disease) ([place])   . GERD (gastroesophageal reflux disease)   . High cholesterol   . History of petit-mal seizures   . Hyperlipidemia   . Hypertension   . Memory impairment   . OSA (obstructive sleep apnea)    Does not tolerate CPAP  . Pacemaker   . PTSD (post-traumatic stress disorder)   . Sick sinus syndrome ([place])  ALLERGIES:  is allergic to other.  MEDICATIONS:  Current Outpatient Medications  Medication Sig Dispense Refill  . albuterol (PROVENTIL) (2.5 MG/3ML) 0.083% nebulizer solution Take 3 mLs (2.5 mg total) by nebulization 2 (two) times daily as needed for wheezing or shortness of breath. 75 mL 0  . Amino Acids-Protein Hydrolys (FEEDING SUPPLEMENT, PRO-STAT SUGAR FREE 64,) LIQD Take 30 mLs by mouth 2 (two) times daily. 887 mL 0  . aspirin EC 81 MG EC  tablet Take 1 tablet (81 mg total) by mouth daily. (Patient not taking: Reported on 11/05/2018)    . atorvastatin (LIPITOR) 40 MG tablet Take 40 mg by mouth daily.     . cholestyramine (QUESTRAN) 4 g packet Take 1 packet (4 g total) by mouth 2 (two) times daily. (Patient not taking: Reported on 12/31/2018) 10 each 0  . diphenhydrAMINE (BENADRYL) 25 MG tablet Take 1 tablet (25 mg total) by mouth every 6 (six) hours as needed for itching or allergies. (Patient not taking: Reported on 12/31/2018) 20 tablet 0  . diphenoxylate-atropine (LOMOTIL) 2.5-0.025 MG tablet Take 1 tablet by mouth 4 (four) times daily as needed for diarrhea or loose stools. (Patient not taking: Reported on 12/31/2018) 30 tablet 0  . escitalopram (LEXAPRO) 20 MG tablet Take 20 mg by mouth daily.     . famotidine (PEPCID) 20 MG tablet Take 1 tablet (20 mg total) by mouth daily. 30 tablet 0  . folic acid (FOLVITE) 1 MG tablet Take 1 tablet (1 mg total) by mouth daily. 30 tablet 4  . lidocaine (XYLOCAINE) 2 % solution Use as directed 5 mLs in the mouth or throat every 3 (three) hours as needed for mouth pain. Gargle and spit 200 mL 1  . lisinopril (ZESTRIL) 5 MG tablet Take 5 mg by mouth daily.    . Multiple Vitamin (MULTIVITAMIN WITH MINERALS) TABS tablet Take 1 tablet by mouth daily. 30 tablet 0  . Potassium Chloride ER 20 MEQ TBCR Take 20 mEq by mouth daily. 7 tablet 0  . prochlorperazine (COMPAZINE) 10 MG tablet Take 1 tablet (10 mg total) by mouth every 6 (six) hours as needed for nausea or vomiting. (Patient not taking: Reported on 12/31/2018) 30 tablet 0   No current facility-administered medications for this visit.    SURGICAL HISTORY:  Past Surgical History:  Procedure Laterality Date  . CARDIAC SURGERY    . CHEST TUBE INSERTION Right 05/16/2018   Procedure: INSERTION PLEURAL DRAINAGE CATHETER;  Surgeon: Ivin Poot, MD;  Location: [facility];  Service: Thoracic;  Laterality: Right;  . CHEST TUBE INSERTION Right 05/16/2018    Procedure: Chest Tube Insertion;  Surgeon: Ivin Poot, MD;  Location: [facility];  Service: Thoracic;  Laterality: Right;  . PACEMAKER INSERTION    . REMOVAL OF PLEURAL DRAINAGE CATHETER Right 08/30/2018   Procedure: REMOVAL OF PLEURAL DRAINAGE CATHETER;  Surgeon: Ivin Poot, MD;  Location: [facility];  Service: Thoracic;  Laterality: Right;    REVIEW OF SYSTEMS:   Review of Systems  Constitutional: Positive for night sweats. Negative for appetite change, chills, fatigue, fever and unexpected weight change.  HENT: Negative for mouth sores, nosebleeds, sore throat and trouble swallowing.   Eyes: Negative for eye problems and icterus.  Respiratory: Negative for cough, hemoptysis, shortness of breath and wheezing.   Cardiovascular: Positive for mild dull ache in chest bilaterally. Negative for leg swelling.  Gastrointestinal: Negative for abdominal pain, constipation, diarrhea, nausea and vomiting.  Genitourinary: Negative for bladder incontinence, difficulty  urinating, dysuria, frequency and hematuria.   Musculoskeletal: Negative for back pain, gait problem, neck pain and neck stiffness.  Skin: Negative for itching and rash.  Neurological: Negative for dizziness, extremity weakness, gait problem, headaches, light-headedness and seizures.  Hematological: Negative for adenopathy. Does not bruise/bleed easily.  Psychiatric/Behavioral: Negative for confusion, depression and sleep disturbance. The patient is not nervous/anxious.     PHYSICAL EXAMINATION:  Blood pressure (!) 172/105, pulse 99, temperature 98 F (36.7 C), temperature source Temporal, resp. rate 17, height 6' (1.829 m), weight 221 lb 1.6 oz (100.3 kg), SpO2 99 %.  ECOG PERFORMANCE STATUS: 1 - Symptomatic but completely ambulatory  Physical Exam  Constitutional: Oriented to person, place, and time and well-developed, well-nourished, and in no distress. No distress.  HENT:  Head: Normocephalic and atraumatic.  Mouth/Throat:  Oropharynx is clear and moist. No oropharyngeal exudate.  Eyes: Conjunctivae are normal. Right eye exhibits no discharge. Left eye exhibits no discharge. No scleral icterus.  Neck: Normal range of motion. Neck supple.  Cardiovascular: Normal rate, regular rhythm, normal heart sounds and intact distal pulses.   Pulmonary/Chest: Effort normal and breath sounds normal except decreased breath sounds in the lower right base of the lung. No respiratory distress. No wheezes. No rales.  Abdominal: Soft. Bowel sounds are normal. Exhibits no distension and no mass. No guarding or rebound tenderness. Mild tenderness in LUQ. Musculoskeletal: Normal range of motion. Exhibits no edema.  Lymphadenopathy:    No cervical adenopathy.  Neurological: Alert and oriented to person, place, and time. Exhibits normal muscle tone. Gait normal. Coordination normal.  Skin: Skin is warm and dry. No rash noted. Not diaphoretic. No erythema. No pallor.  Psychiatric: Mood, memory and judgment normal.  Vitals reviewed.  LABORATORY DATA: Lab Results  Component Value Date   WBC 11.8 (H) 04/04/2019   HGB 17.2 (H) 04/04/2019   HCT 49.6 04/04/2019   MCV 100.6 (H) 04/04/2019   PLT 145 (L) 04/04/2019      Chemistry      Component Value Date/Time   NA 138 04/12/2019 1032   K 3.6 04/12/2019 1032   CL 100 04/12/2019 1032   CO2 30 04/12/2019 1032   BUN 10 04/12/2019 1032   CREATININE 1.09 04/12/2019 1032      Component Value Date/Time   CALCIUM 9.0 04/12/2019 1032   ALKPHOS 113 04/04/2019 1426   AST 31 04/04/2019 1426   ALT 20 04/04/2019 1426   BILITOT 1.2 04/04/2019 1426       RADIOGRAPHIC STUDIES:  CT Chest W Contrast  Result Date: 04/04/2019 CLINICAL DATA:  59 year old male with history of stage IV lung cancer. EXAM: CT CHEST WITH CONTRAST TECHNIQUE: Multidetector CT imaging of the chest was performed during intravenous contrast administration. CONTRAST:  92mL OMNIPAQUE IOHEXOL 300 MG/ML  SOLN COMPARISON:   Chest CT 12/25/2018. FINDINGS: Cardiovascular: Heart size is normal. There is no significant pericardial fluid, thickening or pericardial calcification. There is aortic atherosclerosis, as well as atherosclerosis of the great vessels of the mediastinum and the coronary arteries, including calcified atherosclerotic plaque in the left anterior descending and right coronary arteries. Right-sided pacemaker device in place with lead tips terminating in the right atrial appendage and right ventricular apex. Mediastinum/Nodes: Numerous enlarged right hilar and mediastinal lymph nodes are noted measuring 1.9 cm in short axis in the right paratracheal nodal station (axial image 47 of series 2), 1.5 cm in short axis in the right hilar nodal station (axial image 65 of series 2), and a 1.6  cm short axis subcarinal lymph node (axial image 78 of series 2). Several other borderline enlarged mediastinal lymph nodes are also noted. Esophagus is remarkable for circumferential thickening of the distal esophagus. No axillary lymphadenopathy. Lungs/Pleura: Increasing diffuse pleural thickening and nodularity is noted throughout the right hemithorax, compatible with worsening pleural metastatic disease. The largest pleural nodule is in the superior aspect of the right major fissure (axial image 60 of series 7) measuring 2.4 x 2.2 cm. Several small intraparenchymal pulmonary nodules are also noted elsewhere in the lungs, largest of which is in the right lower lobe (axial image 120 of series 7) measuring 8 mm (previously 6 mm). Left lung is clear. No left pleural effusions. No acute consolidative airspace disease. Upper Abdomen: Aortic atherosclerosis. 2.4 cm low-attenuation lesion in segment 8 of the liver, compatible with a simple cyst. Diffuse low attenuation throughout the hepatic parenchyma, indicative of hepatic steatosis. Musculoskeletal: Healing nondisplaced fractures of the anterolateral aspects of the right fifth, sixth and  seventh ribs. There are no aggressive appearing lytic or blastic lesions noted in the visualized portions of the skeleton. IMPRESSION: 1. Progressive metastatic disease in the right lung, pleural space, as well as right hilar and mediastinal nodal stations, as above. 2. Circumferential thickening of the distal esophagus, which is favored to reflect esophagitis. Further clinical evaluation is recommended. 3. Aortic atherosclerosis, in addition to 2 vessel coronary artery disease. Please note that although the presence of coronary artery calcium documents the presence of coronary artery disease, the severity of this disease and any potential stenosis cannot be assessed on this non-gated CT examination. Assessment for potential risk factor modification, dietary therapy or pharmacologic therapy may be warranted, if clinically indicated. 4. Hepatic steatosis. 5. Additional incidental findings, as above. Aortic Atherosclerosis (ICD10-I70.0). Electronically Signed   By: Vinnie Langton M.D.   On: 04/04/2019 16:02     ASSESSMENT/PLAN:  This is avery pleasant 59 year old Caucasian male with metastatic non-small cell lung cancer, adenocarcinoma. He presented with a right upper lobe lung mass with mediastinal lymphadenopathy and a right malignant pleural effusion. He was diagnosed in January 2020.  The patienthad underwent systemic chemotherapy with carboplatin for an AUC of 5, Alimta 500 mg/m2, and Keytruda 200 mg IV every 3 weeks. The patient is status post 4 cycles of chemotherapy; however,Keytruda was discontinued after cycle #2 due to a significant skin rash. His dose of carboplatin was scheduled to bereduced to AUC of 4starting from cycle #5 as well asAlimta reduced to 400 mg/m due to concerns with tolerability and toxicity. Patient's most recent treatment was on October 16, 2018. The patient was in the hospital from November 05, 2018-August31st, 2031for viral gastritis.The patientmissed several  rescheduled appointments for chemotherapy following his hospitalization due to his memory issues.   The patient recently had a restaging CT scan performed.  Dr. Julien Nordmann personally and independently reviewed the scan and discussed results with the patient today. The scan showed some evidence of disease progression with aggressive metastatic disease in the right lung, pleural space as well as right hilar and mediastinal lymph nodes.   Dr. Julien Nordmann had a lengthy discussion today with the patient by his current condition and treatment options.  Dr. Julien Nordmann with the patient the option of resuming treatment with carboplatin for an AUC of 4 and Alimta for 100 mg per metered squared versus a referral to hospice/palliative care.  The patient is interested in pursuing treatment.  He is expected to start his first dose of treatment on 04/18/19.  We  will see the patient back for follow-up visit in 2 weeks for evaluation in 1 week follow-up visit after receiving his first dose of chemotherapy since resuming treatment.  I had a lengthy discussion today with the patient about his hypertension.  His blood pressure was noted to be elevated while in the clinic today.  The patient did not take his blood pressure pressure medications this morning.  I had a lengthy discussion regarding concerning symptoms of hypertension that warrant immediate medical examination including headaches, visual changes, chest pain, slurred speech, dizziness, extremity weakness, and gait changes.  He expressed understanding.  I also encouraged the patient to buy a blood pressure cuff at home and keep a log of his blood pressure readings.  The patient was encouraged to reach out to his primary care provider regarding modifying his antihypertensive regimen if his blood pressure continues to be uncontrolled while taking his medications as prescribed.  The patient did not take his potassium supplement last week as prescribed secondary to just receiving in  the mail.  I will recheck a BMP and a magnesium while in the clinic today. His potassium is WNL at 3.6. No IV potassium needed today. Will continue to check labs weekly.    I will reach out to the transportation services to arrange for his rides to his treatment starting next week.   The patient was advised to call immediately if he has any concerning symptoms in the interval. The patient voices understanding of current disease status and treatment options and is in agreement with the current care plan. All questions were answered. The patient knows to call the clinic with any problems, questions or concerns. We can certainly see the patient much sooner if necessary   Orders Placed This Encounter  Procedures  . Basic Metabolic Panel - [place] Only    Standing Status:   Future    Number of Occurrences:   1    Standing Expiration Date:   04/11/2020  . Magnesium    Standing Status:   Future    Number of Occurrences:   1    Standing Expiration Date:   04/11/2020  . CBC with Differential (Cancer Center Only)    Standing Status:   Standing    Number of Occurrences:   12    Standing Expiration Date:   04/11/2020  . CMP ([facility] only)    Standing Status:   Standing    Number of Occurrences:   12    Standing Expiration Date:   04/11/2020     Tobe Sos Nazareth Kirk, PA-C 04/12/19  ADDENDUM: Hematology/Oncology Attending: I had a face-to-face encounter with the patient today.  I recommended his care plan.  This is a very pleasant 59 years old white male with a stage IV non-small cell lung cancer, adenocarcinoma initially started with systemic chemotherapy with carboplatin, Alimta and Keytruda for 1 cycle.  [name] was discontinued starting from cycle #2 because of intolerable skin rash.  He continued 3 more cycles of systemic chemotherapy with carboplatin and Alimta but the patient was not compliant and consistent with his treatment.  His treatment has been delayed several times in  the past because of no-show and rescheduling. He has been in observation for the last 3 months. He presented to the clinic today for evaluation after repeating CT scan of the chest, abdomen and pelvis.  I personally and independently reviewed the scans and discussed the results with the patient today.  Unfortunately his scan showed evidence for  disease progression. I had a lengthy discussion with the patient today about his condition and treatment options.  I gave the patient the option of palliative care and hospice referral versus resuming his systemic chemotherapy with carboplatin and Alimta.  The patient is not interested in hospice at this point and he would like to resume treatment.  He promised to keep his appointment as planned. For hypertension he was advised to take his blood pressure medication as prescribed. We will plan for the patient to resume his treatment next week.  He will receive vitamin B12 injection and also resume his treatment with folic acid and Decadron. The patient will come back for follow-up visit in 2 weeks for evaluation and management of any adverse effect of his treatment. For hypertension we strongly recommend for the patient to take his blood pressure medication as prescribed and to monitor it very closely at home. He was advised to call immediately if he has any concerning symptoms in the interval.  Disclaimer: This note was dictated with voice recognition software. Similar sounding words can inadvertently be transcribed and may be missed upon review. Eilleen Kempf, MD 04/12/19

## 2019-04-15 ENCOUNTER — Telehealth: Payer: Self-pay | Admitting: Internal Medicine

## 2019-04-15 ENCOUNTER — Telehealth: Payer: Self-pay | Admitting: Physician Assistant

## 2019-04-15 NOTE — Telephone Encounter (Signed)
Called the patient to inform him I was sending refills of his medications needed for chemo. Went over the instructions again with him and he knows to resume them. Appointments not scheduled. Instructed him to be on the look out from a phone call from our schedulers regarding his appointments this week.

## 2019-04-15 NOTE — Telephone Encounter (Signed)
Scheduled per los. Called and spoke with patient. Confirmed appts. Patient will get printout of schedule on Friday

## 2019-04-18 ENCOUNTER — Other Ambulatory Visit: Payer: Self-pay | Admitting: Internal Medicine

## 2019-04-19 ENCOUNTER — Other Ambulatory Visit: Payer: Self-pay | Admitting: Medical Oncology

## 2019-04-19 ENCOUNTER — Telehealth: Payer: Self-pay | Admitting: Physician Assistant

## 2019-04-19 ENCOUNTER — Ambulatory Visit: Payer: No Typology Code available for payment source

## 2019-04-19 DIAGNOSIS — Z5111 Encounter for antineoplastic chemotherapy: Secondary | ICD-10-CM

## 2019-04-19 NOTE — Telephone Encounter (Signed)
Tried to call the patient about his missed appointment today. His mailbox is full so unable to leave a voicemail. Will try again later. He has memory issues and lives in a tiny home community for veterans. He is established with the transportation program. I have sent a scheduling message to reschedule his infusion for earliest available next week and reach out to transportation once his appointments are made.

## 2019-04-19 NOTE — Progress Notes (Unsigned)
Confirmed plan w/ Dr. Julien Nordmann - No pembrolizumab. Pemetrexed dose will continue per treatment plan (no further reduction). Carboplatin dose to be addressed following CMET today.   Demetrius Charity, PharmD, [place] Pharmacist Pharmacy Phone: [phone] 04/19/2019

## 2019-04-23 NOTE — Progress Notes (Unsigned)
Confirmed plan w/ Dr. Julien Nordmann - No pembrolizumab. Pemetrexed dose will continue per treatment plan (no further reduction). Carboplatin dose to be addressed following CMET today.   Demetrius Charity, PharmD, [place] [facility] Pharmacist Pharmacy Phone: [phone] 04/19/2019

## 2019-04-24 ENCOUNTER — Telehealth: Payer: Self-pay | Admitting: Medical Oncology

## 2019-04-24 ENCOUNTER — Ambulatory Visit: Payer: No Typology Code available for payment source

## 2019-04-24 ENCOUNTER — Ambulatory Visit: Payer: No Typology Code available for payment source | Admitting: Internal Medicine

## 2019-04-24 ENCOUNTER — Other Ambulatory Visit: Payer: No Typology Code available for payment source

## 2019-04-24 NOTE — Telephone Encounter (Signed)
Lattie Haw called and she is no longer pt case Freight forwarder. She referred me to call Lanelle Bal at [phone]. I left vm for Erline Levine to return my call because pt has not shown up for his appts.

## 2019-04-24 NOTE — Telephone Encounter (Signed)
Pt no show appt today .LVM with his case manager , Mertie Clause to return my call.

## 2019-04-25 ENCOUNTER — Other Ambulatory Visit: Payer: No Typology Code available for payment source

## 2019-04-25 ENCOUNTER — Ambulatory Visit: Payer: No Typology Code available for payment source | Admitting: Physician Assistant

## 2019-05-02 ENCOUNTER — Inpatient Hospital Stay: Payer: No Typology Code available for payment source | Attending: Internal Medicine

## 2019-05-02 ENCOUNTER — Inpatient Hospital Stay: Payer: No Typology Code available for payment source | Admitting: Physician Assistant

## 2019-05-07 ENCOUNTER — Telehealth: Payer: Self-pay | Admitting: *Deleted

## 2019-05-07 NOTE — Telephone Encounter (Signed)
Phone [facility] opted for operator and then social work department.  Spoke with Ship broker, she states that they have as well been unable to reach patient even for mental health follow up.  She routed my concern for patients safety to Kellie Simmering, Education officer, museum.  She will call me back.

## 2019-05-08 ENCOUNTER — Telehealth: Payer: Self-pay

## 2019-05-08 NOTE — Telephone Encounter (Signed)
Returned patient's call, no answer, VM box full.

## 2019-05-09 ENCOUNTER — Inpatient Hospital Stay: Payer: No Typology Code available for payment source

## 2019-05-10 ENCOUNTER — Ambulatory Visit: Payer: No Typology Code available for payment source | Admitting: Physician Assistant

## 2019-05-10 ENCOUNTER — Other Ambulatory Visit: Payer: No Typology Code available for payment source

## 2019-05-10 ENCOUNTER — Ambulatory Visit: Payer: No Typology Code available for payment source

## 2019-05-16 ENCOUNTER — Encounter: Payer: Self-pay | Admitting: *Deleted

## 2019-05-16 ENCOUNTER — Other Ambulatory Visit: Payer: No Typology Code available for payment source

## 2019-05-16 ENCOUNTER — Telehealth: Payer: Self-pay | Admitting: Internal Medicine

## 2019-05-16 ENCOUNTER — Inpatient Hospital Stay: Payer: No Typology Code available for payment source | Admitting: Internal Medicine

## 2019-05-16 ENCOUNTER — Inpatient Hospital Stay: Payer: No Typology Code available for payment source | Attending: Internal Medicine

## 2019-05-16 ENCOUNTER — Ambulatory Visit: Payer: No Typology Code available for payment source

## 2019-05-16 DIAGNOSIS — C3411 Malignant neoplasm of upper lobe, right bronchus or lung: Secondary | ICD-10-CM | POA: Insufficient documentation

## 2019-05-16 DIAGNOSIS — Z5111 Encounter for antineoplastic chemotherapy: Secondary | ICD-10-CM | POA: Insufficient documentation

## 2019-05-16 NOTE — Telephone Encounter (Signed)
R/s appt per 3/4 sch message - pt is aware of appt date and time

## 2019-05-16 NOTE — Progress Notes (Signed)
[place] Work  Clinical Social Work received notification from medical oncology RN, patient missed appointment again today.  Patient has missed multiple appointments without notifying healthcare team.    CSW contacted patient by phone- he stated he forgot about appointment.  Patient reported he has memory loss that impacts ability to remember details such as appointments.  Patient has limited to no social support- has utilized [place] services in the past.  CSW notified patient scheduling would be calling to set up a new appointment.  CSW left VM for patient's [facility] SW Kellie Simmering [phone]) to explore support available through [place].     Gwinda Maine, LCSW  Clinical Social Worker [facility]

## 2019-05-18 ENCOUNTER — Emergency Department (HOSPITAL_BASED_OUTPATIENT_CLINIC_OR_DEPARTMENT_OTHER): Payer: No Typology Code available for payment source

## 2019-05-18 ENCOUNTER — Inpatient Hospital Stay (HOSPITAL_COMMUNITY)
Admission: EM | Admit: 2019-05-18 | Discharge: 2019-05-21 | DRG: 253 | Disposition: A | Discharge status: Home / Self Care | Payer: No Typology Code available for payment source | Attending: Internal Medicine | Admitting: Internal Medicine

## 2019-05-18 ENCOUNTER — Observation Stay (HOSPITAL_COMMUNITY): Payer: No Typology Code available for payment source

## 2019-05-18 ENCOUNTER — Other Ambulatory Visit: Payer: Self-pay

## 2019-05-18 DIAGNOSIS — G40A09 Absence epileptic syndrome, not intractable, without status epilepticus: Secondary | ICD-10-CM | POA: Diagnosis present

## 2019-05-18 DIAGNOSIS — R413 Other amnesia: Secondary | ICD-10-CM | POA: Diagnosis present

## 2019-05-18 DIAGNOSIS — C771 Secondary and unspecified malignant neoplasm of intrathoracic lymph nodes: Secondary | ICD-10-CM | POA: Diagnosis present

## 2019-05-18 DIAGNOSIS — I82419 Acute embolism and thrombosis of unspecified femoral vein: Secondary | ICD-10-CM | POA: Diagnosis present

## 2019-05-18 DIAGNOSIS — K219 Gastro-esophageal reflux disease without esophagitis: Secondary | ICD-10-CM | POA: Diagnosis present

## 2019-05-18 DIAGNOSIS — R609 Edema, unspecified: Secondary | ICD-10-CM

## 2019-05-18 DIAGNOSIS — J449 Chronic obstructive pulmonary disease, unspecified: Secondary | ICD-10-CM | POA: Diagnosis present

## 2019-05-18 DIAGNOSIS — Z820 Family history of epilepsy and other diseases of the nervous system: Secondary | ICD-10-CM

## 2019-05-18 DIAGNOSIS — I82409 Acute embolism and thrombosis of unspecified deep veins of unspecified lower extremity: Secondary | ICD-10-CM | POA: Diagnosis present

## 2019-05-18 DIAGNOSIS — Z20822 Contact with and (suspected) exposure to covid-19: Secondary | ICD-10-CM | POA: Diagnosis present

## 2019-05-18 DIAGNOSIS — E785 Hyperlipidemia, unspecified: Secondary | ICD-10-CM | POA: Diagnosis present

## 2019-05-18 DIAGNOSIS — Z950 Presence of cardiac pacemaker: Secondary | ICD-10-CM

## 2019-05-18 DIAGNOSIS — I82412 Acute embolism and thrombosis of left femoral vein: Secondary | ICD-10-CM

## 2019-05-18 DIAGNOSIS — Z7982 Long term (current) use of aspirin: Secondary | ICD-10-CM

## 2019-05-18 DIAGNOSIS — I82442 Acute embolism and thrombosis of left tibial vein: Secondary | ICD-10-CM

## 2019-05-18 DIAGNOSIS — Z9119 Patient's noncompliance with other medical treatment and regimen: Secondary | ICD-10-CM

## 2019-05-18 DIAGNOSIS — I82422 Acute embolism and thrombosis of left iliac vein: Secondary | ICD-10-CM

## 2019-05-18 DIAGNOSIS — M79605 Pain in left leg: Secondary | ICD-10-CM | POA: Diagnosis not present

## 2019-05-18 DIAGNOSIS — I495 Sick sinus syndrome: Secondary | ICD-10-CM | POA: Diagnosis present

## 2019-05-18 DIAGNOSIS — E7800 Pure hypercholesterolemia, unspecified: Secondary | ICD-10-CM | POA: Diagnosis present

## 2019-05-18 DIAGNOSIS — F329 Major depressive disorder, single episode, unspecified: Secondary | ICD-10-CM | POA: Diagnosis present

## 2019-05-18 DIAGNOSIS — G4733 Obstructive sleep apnea (adult) (pediatric): Secondary | ICD-10-CM | POA: Diagnosis present

## 2019-05-18 DIAGNOSIS — Z809 Family history of malignant neoplasm, unspecified: Secondary | ICD-10-CM

## 2019-05-18 DIAGNOSIS — C3491 Malignant neoplasm of unspecified part of right bronchus or lung: Secondary | ICD-10-CM | POA: Diagnosis present

## 2019-05-18 DIAGNOSIS — I82432 Acute embolism and thrombosis of left popliteal vein: Secondary | ICD-10-CM

## 2019-05-18 DIAGNOSIS — I10 Essential (primary) hypertension: Secondary | ICD-10-CM | POA: Diagnosis present

## 2019-05-18 DIAGNOSIS — I82452 Acute embolism and thrombosis of left peroneal vein: Secondary | ICD-10-CM | POA: Diagnosis present

## 2019-05-18 DIAGNOSIS — F4310 Post-traumatic stress disorder, unspecified: Secondary | ICD-10-CM | POA: Diagnosis present

## 2019-05-18 LAB — CBC WITH DIFFERENTIAL/PLATELET
Abs Immature Granulocytes: 0.03 10*3/uL (ref 0.00–0.07)
Basophils Absolute: 0.1 10*3/uL (ref 0.0–0.1)
Basophils Relative: 1 %
Eosinophils Absolute: 0.9 10*3/uL — ABNORMAL HIGH (ref 0.0–0.5)
Eosinophils Relative: 9 %
HCT: 48 % (ref 39.0–52.0)
Hemoglobin: 16.5 g/dL (ref 13.0–17.0)
Immature Granulocytes: 0 %
Lymphocytes Relative: 15 %
Lymphs Abs: 1.7 10*3/uL (ref 0.7–4.0)
MCH: 35 pg — ABNORMAL HIGH (ref 26.0–34.0)
MCHC: 34.4 g/dL (ref 30.0–36.0)
MCV: 101.7 fL — ABNORMAL HIGH (ref 80.0–100.0)
Monocytes Absolute: 1 10*3/uL (ref 0.1–1.0)
Monocytes Relative: 10 %
Neutro Abs: 7 10*3/uL (ref 1.7–7.7)
Neutrophils Relative %: 65 %
Platelets: 140 10*3/uL — ABNORMAL LOW (ref 150–400)
RBC: 4.72 MIL/uL (ref 4.22–5.81)
RDW: 14.1 % (ref 11.5–15.5)
WBC: 10.8 10*3/uL — ABNORMAL HIGH (ref 4.0–10.5)
nRBC: 0 % (ref 0.0–0.2)

## 2019-05-18 LAB — BASIC METABOLIC PANEL
Anion gap: 13 (ref 5–15)
BUN: 11 mg/dL (ref 8–23)
CO2: 28 mmol/L (ref 22–32)
Calcium: 8.7 mg/dL — ABNORMAL LOW (ref 8.9–10.3)
Chloride: 99 mmol/L (ref 98–111)
Creatinine, Ser: 1.11 mg/dL (ref 0.61–1.24)
GFR calc Af Amer: >60 mL/min (ref >60)
GFR calc non Af Amer: >60 mL/min (ref >60)
Glucose, Bld: 101 mg/dL — ABNORMAL HIGH (ref 70–99)
Potassium: 3.2 mmol/L — ABNORMAL LOW (ref 3.5–5.1)
Sodium: 140 mmol/L (ref 135–145)

## 2019-05-18 LAB — HIV ANTIBODY (ROUTINE TESTING W REFLEX): HIV Screen 4th Generation wRfx: NONREACTIVE

## 2019-05-18 LAB — MAGNESIUM: Magnesium: 1.3 mg/dL — ABNORMAL LOW (ref 1.7–2.4)

## 2019-05-18 MED ORDER — POTASSIUM CHLORIDE CRYS ER 20 MEQ PO TBCR
40.0000 meq | EXTENDED_RELEASE_TABLET | Freq: Once | ORAL | Status: AC
  Administered 2019-05-18: 40 meq via ORAL
  Filled 2019-05-18: qty 2

## 2019-05-18 MED ORDER — ATORVASTATIN CALCIUM 40 MG PO TABS
40.0000 mg | ORAL_TABLET | Freq: Every day | ORAL | Status: DC
  Administered 2019-05-18 – 2019-05-21 (×4): 40 mg via ORAL
  Filled 2019-05-18 (×4): qty 1

## 2019-05-18 MED ORDER — HEPARIN BOLUS VIA INFUSION
6000.0000 [IU] | Freq: Once | INTRAVENOUS | Status: AC
  Administered 2019-05-18: 6000 [IU] via INTRAVENOUS
  Filled 2019-05-18: qty 6000

## 2019-05-18 MED ORDER — IOHEXOL 300 MG/ML  SOLN
100.0000 mL | Freq: Once | INTRAMUSCULAR | Status: AC | PRN
  Administered 2019-05-18: 100 mL via INTRAVENOUS

## 2019-05-18 MED ORDER — FOLIC ACID 1 MG PO TABS
1.0000 mg | ORAL_TABLET | Freq: Every day | ORAL | Status: DC
  Administered 2019-05-18 – 2019-05-21 (×4): 1 mg via ORAL
  Filled 2019-05-18 (×4): qty 1

## 2019-05-18 MED ORDER — FAMOTIDINE 20 MG PO TABS
20.0000 mg | ORAL_TABLET | Freq: Every day | ORAL | Status: DC
  Administered 2019-05-18 – 2019-05-21 (×4): 20 mg via ORAL
  Filled 2019-05-18 (×4): qty 1

## 2019-05-18 MED ORDER — ACETAMINOPHEN 325 MG PO TABS: 650.0000 mg | ORAL_TABLET | Freq: Four times a day (QID) | ORAL | Status: DC | PRN

## 2019-05-18 MED ORDER — ADULT MULTIVITAMIN W/MINERALS CH
1.0000 | ORAL_TABLET | Freq: Every day | ORAL | Status: DC
  Administered 2019-05-18 – 2019-05-21 (×4): 1 via ORAL
  Filled 2019-05-18 (×4): qty 1

## 2019-05-18 MED ORDER — LISINOPRIL 5 MG PO TABS
5.0000 mg | ORAL_TABLET | Freq: Every day | ORAL | Status: DC
  Administered 2019-05-18 – 2019-05-21 (×4): 5 mg via ORAL
  Filled 2019-05-18 (×4): qty 1

## 2019-05-18 MED ORDER — ACETAMINOPHEN 650 MG RE SUPP: 650.0000 mg | Freq: Four times a day (QID) | RECTAL | Status: DC | PRN

## 2019-05-18 MED ORDER — SODIUM CHLORIDE 0.9% FLUSH
3.0000 mL | Freq: Two times a day (BID) | INTRAVENOUS | Status: DC
  Administered 2019-05-18 – 2019-05-19 (×3): 3 mL via INTRAVENOUS

## 2019-05-18 MED ORDER — GABAPENTIN 100 MG PO CAPS
100.0000 mg | ORAL_CAPSULE | Freq: Every day | ORAL | Status: DC
  Administered 2019-05-18 – 2019-05-20 (×3): 100 mg via ORAL
  Filled 2019-05-18 (×3): qty 1

## 2019-05-18 MED ORDER — ESCITALOPRAM OXALATE 20 MG PO TABS
20.0000 mg | ORAL_TABLET | Freq: Every day | ORAL | Status: DC
  Administered 2019-05-19 – 2019-05-21 (×3): 20 mg via ORAL
  Filled 2019-05-18 (×3): qty 1
  Filled 2019-05-18: qty 2

## 2019-05-18 MED ORDER — HEPARIN (PORCINE) 25000 UT/250ML-% IV SOLN
1900.0000 [IU]/h | INTRAVENOUS | Status: DC
  Administered 2019-05-18: 1600 [IU]/h via INTRAVENOUS
  Filled 2019-05-18 (×3): qty 250

## 2019-05-18 NOTE — Progress Notes (Signed)
ANTICOAGULATION CONSULT NOTE - Initial Consult  Pharmacy Consult for heparin Indication: DVT  Allergies  Allergen Reactions  . Other Other (See Comments)    Patient states "I had a scrotal procedure a couple of years ago, there was something they gave me to relax me and I started freaking out and seeing things".  Unable to ascertain from pt or his records what medication was.    Patient Measurements: Height: 6' (182.9 cm) Weight: 221 lb 1.6 oz (100.3 kg) IBW/kg (Calculated) : 77.6 Heparin Dosing Weight: 98kg  Vital Signs: Temp: 99.1 F (37.3 C) (03/06 1537) Temp Source: Oral (03/06 1537) BP: 159/104 (03/06 1900) Pulse Rate: 89 (03/06 1900)  Labs: Recent Labs    05/18/19 1919  HGB 16.5  HCT 48.0  PLT 140*    CrCl cannot be calculated (Patient's most recent lab result is older than the maximum 21 days allowed.).   Medical History: Past Medical History:  Diagnosis Date  . Asthma   . Cancer ([place])   . COPD (chronic obstructive pulmonary disease) ([place])   . GERD (gastroesophageal reflux disease)   . High cholesterol   . History of petit-mal seizures   . Hyperlipidemia   . Hypertension   . Memory impairment   . OSA (obstructive sleep apnea)    Does not tolerate CPAP  . Pacemaker   . PTSD (post-traumatic stress disorder)   . Sick sinus syndrome (HCC)     Medications:  Infusions:  . heparin      Assessment: 1 yom presented to the ED with leg pain and swelling. To start IV heparin for DVT treatment. Baseline Hgb is WNL and platelets are slightly low. He is not on anticoagulation PTA.   Goal of Therapy:  Heparin level 0.3-0.7 units/ml Monitor platelets by anticoagulation protocol: Yes   Plan:  Heparin bolus 6000 units IV x 1 Heparin gtt 1600 units/hr Check a 6 hr heparin level Daily heparin level and CBC  Treyveon Mochizuki, Rande Lawman 05/18/2019,7:44 PM

## 2019-05-18 NOTE — Progress Notes (Signed)
VASCULAR LAB PRELIMINARY  PRELIMINARY  PRELIMINARY  PRELIMINARY  Left lower extremity venous duplex completed.   IVC, Iliac vein duplex completed.  Preliminary report:  See CV proc for preliminary results.  Gave report to Charmaine Downs, PA-C  Robie [place], RVT 05/18/2019, 7:42 PM

## 2019-05-18 NOTE — ED Provider Notes (Signed)
[facility] EMERGENCY DEPARTMENT Provider Note   CSN: [id number] Arrival date & time: 05/18/19  1526     History Chief Complaint  Patient presents with  . Leg Pain  . Leg Swelling    Left    Daniel Bryant is a 59 y.o. male with a past medical history significant for asthma, cancer, COPD, GERD, hyperlipidemia, sick sinus syndrome status post pacemaker, and hypertension who presents to the ED due to gradual onset of worsening left lower extremity edema and pain x2 days.  Patient states pain radiates from his left calf all the way up to his left groin region.  Admits to falling on his left leg a few days prior.  Patient denies head injury and loss of consciousness.  He is not currently on any blood thinners.  Patient notes swelling has gradually worsened since waiting in the waiting room and now extends up past his left knee.  Patient denies history of blood clots, recent surgeries, recent long immobilizations, and current hormonal treatments.  He notes he has not been taking his hypertensive medications for the past few days simply because he keeps forgetting.  Denies current headache, visual changes, chest pain, shortness of breath.      Past Medical History:  Diagnosis Date  . Asthma   . Cancer ([place])   . COPD (chronic obstructive pulmonary disease) ([place])   . GERD (gastroesophageal reflux disease)   . High cholesterol   . History of petit-mal seizures   . Hyperlipidemia   . Hypertension   . Memory impairment   . OSA (obstructive sleep apnea)    Does not tolerate CPAP  . Pacemaker   . PTSD (post-traumatic stress disorder)   . Sick sinus syndrome [facility])     Patient Active Problem List   Diagnosis Date Noted  . Diarrhea 11/06/2018  . Viral gastroenteritis 11/05/2018  . Orthostatic hypotension 11/05/2018  . Hypomagnesemia 11/05/2018  . AKI (acute kidney injury) ([place]) 11/05/2018  . Chemotherapy-induced neutropenia ([place])   . Neutropenic fever ([place])  08/07/2018  . 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 Antineoplastic chemotherapy induced pancytopenia ([place]) 08/07/2018  . Hyponatremia 08/07/2018  . Drug-induced skin rash 07/24/2018  . Goals of care, counseling/discussion 06/14/2018  . Encounter for antineoplastic chemotherapy 06/14/2018  . Encounter for antineoplastic immunotherapy 06/14/2018  . Adenocarcinoma of right lung ([place]) 05/02/2018  . COPD (chronic obstructive pulmonary disease) ([place]) 05/02/2018  . Recurrent pleural effusion on right 05/01/2018  . Pacemaker 04/21/2018  . 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 Hypokalemia 04/05/2018  . High cholesterol 04/05/2018  . Hypertension 04/05/2018  . Polycythemia 04/05/2018    Past Surgical History:  Procedure Laterality Date  . CARDIAC SURGERY    . CHEST TUBE INSERTION Right 05/16/2018   Procedure: INSERTION PLEURAL DRAINAGE CATHETER;  Surgeon: Ivin Poot, MD;  Location: [place];  Service: Thoracic;  Laterality: Right;  . CHEST TUBE INSERTION Right 05/16/2018   Procedure: Chest Tube Insertion;  Surgeon: Ivin Poot, MD;  Location: [place] [facility];  Service: Thoracic;  Laterality: Right;  . PACEMAKER INSERTION    . REMOVAL OF PLEURAL DRAINAGE CATHETER Right 08/30/2018   Procedure: REMOVAL OF PLEURAL DRAINAGE CATHETER;  Surgeon: Ivin Poot, MD;  Location: [facility] OR;  Service: Thoracic;  Laterality: Right;       Family History  Problem Relation Age of Onset  . Alzheimer's disease Mother   . Cancer Mother   . Memory loss Father     Social History  Tobacco Use  . Smoking status: Former Smoker    Types: E-cigarettes  . Smokeless tobacco: Current User    Types: Chew  Substance Use Topics  . Alcohol use: Not Currently  . Drug use: No    Home Medications Prior to Admission medications   Medication Sig Start Date End Date Taking? Authorizing Provider  albuterol (PROVENTIL) (2.5 MG/3ML) 0.083% nebulizer solution Take 3 mLs (2.5 mg total) by nebulization 2 (two) times daily  as needed for wheezing or shortness of breath. 04/09/18   Kayleen Memos, DO  Amino Acids-Protein Hydrolys (FEEDING SUPPLEMENT, PRO-STAT SUGAR FREE 64,) LIQD Take 30 mLs by mouth 2 (two) times daily. 11/12/18   Lavina Hamman, MD  aspirin EC 81 MG EC tablet Take 1 tablet (81 mg total) by mouth daily. Patient not taking: Reported on 11/05/2018 05/22/18   Antony Odea, PA-C  atorvastatin (LIPITOR) 40 MG tablet Take 40 mg by mouth daily.     [provider]  cholestyramine (QUESTRAN) 4 g packet Take 1 packet (4 g total) by mouth 2 (two) times daily. Patient not taking: Reported on 12/31/2018 11/12/18   Lavina Hamman, MD  dexamethasone (DECADRON) 4 MG tablet Take 1 tablet twice a day the day before, the day of, and the day after chemotherapy 04/12/19   Heilingoetter, Cassandra L, PA-C  diphenhydrAMINE (BENADRYL) 25 MG tablet Take 1 tablet (25 mg total) by mouth every 6 (six) hours as needed for itching or allergies. Patient not taking: Reported on 12/31/2018 07/30/18   Domenic Moras, PA-C  diphenoxylate-atropine (LOMOTIL) 2.5-0.025 MG tablet Take 1 tablet by mouth 4 (four) times daily as needed for diarrhea or loose stools. Patient not taking: Reported on 12/31/2018 11/12/18   Lavina Hamman, MD  escitalopram (LEXAPRO) 20 MG tablet Take 20 mg by mouth daily.     [provider]  famotidine (PEPCID) 20 MG tablet Take 1 tablet (20 mg total) by mouth daily. 11/12/18   Lavina Hamman, MD  folic acid (FOLVITE) 1 MG tablet Take 1 tablet (1 mg total) by mouth daily. 04/12/19   Heilingoetter, Cassandra L, PA-C  lidocaine (XYLOCAINE) 2 % solution Use as directed 5 mLs in the mouth or throat every 3 (three) hours as needed for mouth pain. Gargle and spit 07/04/18   Tanner, Lyndon Code., PA-C  lisinopril (ZESTRIL) 5 MG tablet Take 5 mg by mouth daily.    [provider]  Multiple Vitamin (MULTIVITAMIN WITH MINERALS) TABS tablet Take 1 tablet by mouth daily. 11/13/18   Lavina Hamman, MD    Potassium Chloride ER 20 MEQ TBCR Take 20 mEq by mouth daily. 04/04/19   Heilingoetter, Cassandra L, PA-C  prochlorperazine (COMPAZINE) 10 MG tablet Take 1 tablet (10 mg total) by mouth every 6 (six) hours as needed for nausea or vomiting. Patient not taking: Reported on 12/31/2018 07/24/18   Curt Bears, MD    Allergies    Other  Review of Systems   Review of Systems  Constitutional: Negative for chills and fever.  Eyes: Negative for visual disturbance.  Respiratory: Negative for shortness of breath.   Cardiovascular: Positive for leg swelling. Negative for chest pain.  Gastrointestinal: Negative for abdominal pain, diarrhea, nausea and vomiting.  Genitourinary: Negative for penile swelling, scrotal swelling and testicular pain.  Neurological: Negative for headaches.  All other systems reviewed and are negative.   Physical Exam Updated Vital Signs BP (!) 165/104   Pulse 98   Temp 99.1 F (37.3  C) (Oral)   Resp (!) 21   SpO2 98%   Physical Exam Vitals and nursing note reviewed.  Constitutional:      General: He is not in acute distress.    Appearance: He is not toxic-appearing.  HENT:     Head: Normocephalic.  Eyes:     Pupils: Pupils are equal, round, and reactive to light.  Cardiovascular:     Rate and Rhythm: Normal rate and regular rhythm.     Pulses: Normal pulses.     Heart sounds: Normal heart sounds. No murmur. No friction rub. No gallop.   Pulmonary:     Effort: Pulmonary effort is normal.     Breath sounds: Normal breath sounds.  Abdominal:     General: Abdomen is flat. Bowel sounds are normal. There is no distension.     Palpations: Abdomen is soft.     Tenderness: There is no abdominal tenderness. There is no guarding or rebound.  Musculoskeletal:     Cervical back: Neck supple.     Comments: Tenderness palpation throughout left calf with mild edema surrounding left ankle past left knee.  Distal pulses intact.  Mild warmth to left lower extremity.   Full range of motion of left ankle and left knee.  Skin:    General: Skin is warm and dry.  Neurological:     General: No focal deficit present.     Mental Status: He is alert.  Psychiatric:        Mood and Affect: Mood normal.        Behavior: Behavior normal.     ED Results / Procedures / Treatments   Labs (all labs ordered are listed, but only abnormal results are displayed) Labs Reviewed - No data to display  EKG None  Radiology No results found.  Procedures .Critical Care Performed by: Suzy Bouchard, PA-C Authorized by: Suzy Bouchard, PA-C   Critical care provider statement:    Critical care time (minutes):  45   Critical care was necessary to treat or prevent imminent or life-threatening deterioration of the following conditions:  Circulatory failure   Critical care was time spent personally by me on the following activities:  Discussions with consultants, evaluation of patient's response to treatment, examination of patient, ordering and performing treatments and interventions, ordering and review of laboratory studies, ordering and review of radiographic studies, pulse oximetry, re-evaluation of patient's condition, obtaining history from patient or surrogate and review of old charts   I assumed direction of critical care for this patient from another provider in my specialty: no     (including critical care time)  Medications Ordered in ED Medications - No data to display  ED Course  I have reviewed the triage vital signs and the nursing notes.  Pertinent labs & imaging results that were available during my care of the patient were reviewed by me and considered in my medical decision making (see chart for details).  Clinical Course as of May 17 2001  Sat May 18, 2019  1906 Spoke to vascular who recommends starting patient on Heparin with medicine admission. Vascular will tonight or tomorrow morning.    [CA]  1933 Spoke to Dr. Court Joy who agrees to  evaluate and admit patient for further treatment.    [CA]    Clinical Course User Index [CA] Suzy Bouchard, PA-C   MDM Rules/Calculators/A&P  59 year old male presents to the ED due to left lower extremity edema and pain x2 days.  Patient hypertensive at 177/112 upon arrival likely due to noncompliance with hypertensive medication.  Otherwise unremarkable vitals.  Patient in no acute distress and non-ill-appearing.  Left lower extremity tender throughout with mild edema and warmth.  Left lower extremity neurovascularly intact.  Will obtain ultrasound to rule out DVT.  Informed by tech that patient has severe clotting of his left lower extremity.  Discussed case with Dr. Reather Converse who agrees with assessment and plan. Spoke to vascular surgery.  See note above.  Order placed for heparin.  Spoke to Dr. Court Joy who agrees to admit patient for further treatment.  CBC significant for mild leukocytosis at 10.8.  BMP significant for hypokalemia at 3.2, but otherwise reassuring.  [place] personally reviewed which demonstrates: RIGHT:  - No evidence of common femoral vein obstruction.    LEFT:  - Findings consistent with acute deep vein thrombosis involving the left common femoral vein, SF junction, left femoral vein, left popliteal vein, left posterior tibial veins, left peroneal veins, and EIV.  - Findings consistent with acute superficial vein thrombosis involving the left great saphenous vein.    Final Clinical Impression(s) / ED Diagnoses Final diagnoses:  None    Rx / DC Orders ED Discharge Orders    None       Karie Kirks 05/18/19 2303    Elnora Morrison, MD 05/20/19 [phone]

## 2019-05-18 NOTE — H&P (Signed)
Date: 05/18/2019               Patient Name:  Daniel Bryant MRN: [id number]  DOB: [birth year] Age / Sex: 59 y.o., male   PCP: [facility]         Medical Service: Internal Medicine Teaching Service         Attending Physician: Dr. Velna Ochs, MD    First Contact: Dr. Marianna Payment Pager: [phone]  Second Contact: Dr. 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  Patient noticed two days ago his left leg started to swell and progressively got worse so he came to the emergency department.  He denies any recent travel , long periods of sitting, recent illness, or history of blood clots. Noted to vascular surgery he had fell a few days ago when carrying groceries.    Diagnosed with stage IV non-small cell lung cancer in January 2020.  Patient treated with systemic chemotherapy and treatment course has been complicated by patient missing several appointments secondary to his short-term memory issues. He opted for 43-month break from chemotherapy at the end of 2020.  On his last in office visit, notes show patient was told CT scan performed for staging showed evidence of disease progression with aggressive metastatic disease in the right lung, right pleural space , as well as enlarged mediastinal lymph nodes. Patient was noted to not be interested in hospice care at this time. Plan was called for reminder on 2/1, with plan to restart patient on systemic chemotherapy on 04/18/2019.Patient missed appointment. On my exam, patient says that he was told 2 months ago he was  cancer free. He admits to having difficulty with his memory. Vascular surgery was consulted by the emergency room and are following patient.    Meds:  No current facility-administered medications on file prior to encounter.   Current Outpatient Medications on File Prior to Encounter  Medication Sig  . albuterol (PROVENTIL) (2.5 MG/3ML) 0.083% nebulizer solution Take 3 mLs (2.5 mg total) by nebulization 2 (two) times daily as needed for wheezing or shortness of breath.  . folic acid (FOLVITE) 1 MG tablet Take 1 tablet (1 mg total) by mouth daily.  . Amino Acids-Protein Hydrolys (FEEDING SUPPLEMENT, PRO-STAT SUGAR FREE 64,) LIQD Take 30 mLs by mouth 2 (two) times daily.  Marland Kitchen atorvastatin (LIPITOR) 40 MG tablet Take 40 mg by mouth daily.   . busPIRone (BUSPAR) 10 MG tablet Take 5 mg by mouth 2 (two) times daily.  Marland Kitchen escitalopram (LEXAPRO) 20 MG tablet Take 20 mg by mouth daily.   . famotidine (PEPCID) 20 MG tablet Take 1 tablet (20 mg total) by mouth daily.  Marland Kitchen gabapentin (NEURONTIN) 100 MG capsule Take 100 mg by mouth at bedtime.  Marland Kitchen lisinopril (ZESTRIL) 5 MG tablet Take 5 mg by mouth daily.  . Multiple Vitamin (MULTIVITAMIN WITH MINERALS) TABS tablet Take 1 tablet by mouth daily.  . Potassium Chloride ER 20 MEQ TBCR Take 20 mEq by mouth daily. (Patient not taking: Reported on 05/18/2019)  . prochlorperazine (COMPAZINE) 10  MG tablet Take 1 tablet (10 mg total) by mouth every 6 (six) hours as needed for nausea or vomiting. (Patient not taking: Reported on 12/31/2018)    Allergies: Allergies as of 05/18/2019 - Review Complete 05/18/2019  Allergen Reaction Noted  . Other Other (See Comments) 07/13/2018   Past Medical History:  Diagnosis Date  . Asthma   . Cancer ([place])   . COPD (chronic obstructive pulmonary disease) ([place])   . GERD (gastroesophageal reflux disease)   . High cholesterol   . History of petit-mal seizures   . Hyperlipidemia   . Hypertension   . Memory impairment   . OSA  (obstructive sleep apnea)    Does not tolerate CPAP  . Pacemaker   . PTSD (post-traumatic stress disorder)   . Sick sinus syndrome (HCC)     Family History:  Family History  Problem Relation Age of Onset  . Alzheimer's disease Mother   . Cancer Mother   . Memory loss Father      Social History:  Social History   Tobacco Use  . Smoking status: Former Smoker    Types: E-cigarettes  . Smokeless tobacco: Current User    Types: Chew  Substance Use Topics  . Alcohol use: Not Currently  . Drug use: No    Review of Systems: A complete ROS was negative except as per HPI.   Physical Exam: Blood pressure (!) 174/115, pulse 88, temperature 99.1 F (37.3 C), temperature source Oral, resp. rate 11, height 6' (1.829 m), weight 100.3 kg, SpO2 96 %.   General: Alert, nl appearance HE: Normocephalic, atraumatic , EOMI, Conjunctivae normal ENT: No congestion, no rhinorrhea moist, no exudate , erythema in posterior oral pharynx Cardiovascular: Normal rate, regular rhythm.  No murmurs, rubs, or gallops Pulmonary : Effort normal, breath sounds normal. No wheezes, rales, or rhonchi Abdominal: soft, mild tenderness belt like across upper abdomen with palpation,  bowel sounds normal Musculoskeletal: Left leg approximately 4-5 cm larger in circumfrence, no sign of injury , tenderness in let extremities. 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  #Extensive left lower extremity  DVT Left lower extremity venous duplex shows extensive DVT in left common iliac, external iliac ,common femoral vein and remaining of the left lower extremity including left popliteal , superficial femoral veins, and tibial veins.  Vascular surgery has been consulted, greatly appreciate the consult.   CT abdomen pelvis ordered in ED, Vascular Surgery notes they will follow along, view imaging, and are considering if patient is a great candidate for thrombectomy or thrombolysis with given cancer diagnosis.   Plan - CT abdomen and pelvis -Therapeutic Heparin  #Stage IV metastatic non-small cell lung cancer CT chest with contrast on 04/04/2019 shows progressive metastatic disease in the right lung pleural space as well as right hilar mediastinal nodal stations.  On exam patient noted he was told he was cancer free.  This is not reflected, the exact opposite is reflected in the note and is very concerning patient  memory issues are greatly effecting him.   #Static short-term memory loss Patient followed at the [place].  Appears a note on 06/19/2018 by social worker Candis Schatz notes patient has declined placement in assisted living.  They also note memory deficits began at the pacemaker surgery prior to the patient was a highly skilled Conservation officer, nature and completely independent.  CT head without contrast was done May 01, 2018 and was normal.  Family history includes mother with Alzheimer's. Plan: -Consider getting [facility]  #HLD -Continue home atorvastatin 40 mg daily  #HTN  Patient has not taken blood pressure medications today, BP elevated 159-180/103-115 - continue home lisinopril 5 mg  #Depression  -Continue home Lexapro   Dispo: Admit patient to Observation with expected length of stay less than 2 midnights.  Signed:  Tamsen Snider, MD PGY1

## 2019-05-18 NOTE — ED Notes (Signed)
Pt has 2+ edema of left leg, 2+ left pedal pulse, cap refill less than 3 sec, pt able to wiggle toes, warm to touch, sensation intact.

## 2019-05-18 NOTE — ED Triage Notes (Addendum)
Pt bib ems from home with swelling and pain to LLE onset last night. Pt also c/o pain to L groin. Denies known injury. Hypertensive with ems 190/100, HR 107, 98% RA, 98.22F.

## 2019-05-18 NOTE — Consult Note (Signed)
Hospital Consult    Reason for Consult:  Extensive LLE DVT Referring Physician:  ED MRN #:  [id number]  History of Present Illness: This is a 59 y.o. male with history of hypertension, hyperlipidemia, COPD, lung cancer that vascular surgery has been consulted for extensive left lower extremity DVT.  Patient states that he noticed leg swelling in the left lower extremity approximately 2 days ago that got much worse and prompted his presentation to the ED.  He did have some associated trauma where he fell down on his left leg after carrying some groceries several days ago as well.  He denies any previous history of DVTs or other blood clots or thromboembolic events.  No weakness or numbness in the left leg.  States he does not smoke and quit years ago.    He does have a history of lung cancer and states he underwent chemotherapy and has no evidence of disease at this time.  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  Recent notes from the cancer center on 04/12/19 indicate recent staging CT showed evidence of disease progression with metastatic disease in the right lung pleural space and right hilar mediastinal lymph nodes.  Options were discussed including resuming treatment or referring to hospice palliative care.  Past Medical History:  Diagnosis Date  . Asthma   . Cancer ([place])   . COPD (chronic obstructive pulmonary disease) ([place])   . GERD (gastroesophageal reflux disease)   . High cholesterol   . History of petit-mal seizures   . Hyperlipidemia   . Hypertension   . Memory impairment   . OSA (obstructive sleep apnea)    Does not tolerate CPAP  . Pacemaker   . PTSD (post-traumatic stress disorder)   . Sick sinus syndrome [facility])     Past Surgical History:  Procedure Laterality Date  . CARDIAC SURGERY    . CHEST TUBE  INSERTION Right 05/16/2018   Procedure: INSERTION PLEURAL DRAINAGE CATHETER;  Surgeon: Ivin Poot, MD;  Location: [place];  Service: Thoracic;  Laterality: Right;  . CHEST TUBE INSERTION Right 05/16/2018   Procedure: Chest Tube Insertion;  Surgeon: Ivin Poot, MD;  Location: [place];  Service: Thoracic;  Laterality: Right;  . PACEMAKER INSERTION    . REMOVAL OF PLEURAL DRAINAGE CATHETER Right 08/30/2018   Procedure: REMOVAL OF PLEURAL DRAINAGE CATHETER;  Surgeon: Ivin Poot, MD;  Location: [place];  Service: Thoracic;  Laterality: Right;    Allergies  Allergen Reactions  . Other Other (See Comments)    Patient states "I had a scrotal procedure a couple of years ago, there was something they gave me to relax me and I started freaking out and seeing things".  Unable to ascertain from pt or his records what medication was.    Prior to Admission medications   Medication Sig Start Date End Date Taking? Authorizing Provider  albuterol (PROVENTIL) (2.5 MG/3ML) 0.083% nebulizer solution Take 3 mLs (2.5 mg total) by nebulization 2 (two) times daily as needed for wheezing or shortness of breath. 04/09/18   Kayleen Memos, DO  Amino Acids-Protein Hydrolys (FEEDING SUPPLEMENT, PRO-STAT SUGAR FREE 64,) LIQD Take 30 mLs by mouth 2 (two) times daily. 11/12/18   Lavina Hamman, MD  aspirin EC 81 MG EC tablet Take 1 tablet (81 mg total) by mouth daily. Patient not taking:  Reported on 11/05/2018 05/22/18   Antony Odea, PA-C  atorvastatin (LIPITOR) 40 MG tablet Take 40 mg by mouth daily.     [provider]  cholestyramine (QUESTRAN) 4 g packet Take 1 packet (4 g total) by mouth 2 (two) times daily. Patient not taking: Reported on 12/31/2018 11/12/18   Lavina Hamman, MD  dexamethasone (DECADRON) 4 MG tablet Take 1 tablet twice a day the day before, the day of, and the day after chemotherapy 04/12/19   Heilingoetter, Cassandra L, PA-C  diphenhydrAMINE (BENADRYL) 25 MG tablet Take 1 tablet  (25 mg total) by mouth every 6 (six) hours as needed for itching or allergies. Patient not taking: Reported on 12/31/2018 07/30/18   Domenic Moras, PA-C  diphenoxylate-atropine (LOMOTIL) 2.5-0.025 MG tablet Take 1 tablet by mouth 4 (four) times daily as needed for diarrhea or loose stools. Patient not taking: Reported on 12/31/2018 11/12/18   Lavina Hamman, MD  escitalopram (LEXAPRO) 20 MG tablet Take 20 mg by mouth daily.     [provider]  famotidine (PEPCID) 20 MG tablet Take 1 tablet (20 mg total) by mouth daily. 11/12/18   Lavina Hamman, MD  folic acid (FOLVITE) 1 MG tablet Take 1 tablet (1 mg total) by mouth daily. 04/12/19   Heilingoetter, Cassandra L, PA-C  lidocaine (XYLOCAINE) 2 % solution Use as directed 5 mLs in the mouth or throat every 3 (three) hours as needed for mouth pain. Gargle and spit 07/04/18   Tanner, Lyndon Code., PA-C  lisinopril (ZESTRIL) 5 MG tablet Take 5 mg by mouth daily.    [provider]  Multiple Vitamin (MULTIVITAMIN WITH MINERALS) TABS tablet Take 1 tablet by mouth daily. 11/13/18   Lavina Hamman, MD  Potassium Chloride ER 20 MEQ TBCR Take 20 mEq by mouth daily. Patient not taking: Reported on 05/18/2019 04/04/19   Heilingoetter, Cassandra L, PA-C  prochlorperazine (COMPAZINE) 10 MG tablet Take 1 tablet (10 mg total) by mouth every 6 (six) hours as needed for nausea or vomiting. Patient not taking: Reported on 12/31/2018 07/24/18   Curt Bears, MD    Social History   Socioeconomic History  . Marital status: Widowed    Spouse name: Not on file  . Number of children: Not on file  . Years of education: Not on file  . Highest education level: Not on file  Occupational History  . Not on file  Tobacco Use  . Smoking status: Former Smoker    Types: E-cigarettes  . Smokeless tobacco: Current User    Types: Chew  Substance and Sexual Activity  . Alcohol use: Not Currently  . Drug use: No  . Sexual activity: Not on file  Other Topics Concern    . Not on file  Social History Narrative  . Not on file   Social Determinants of Health   Financial Resource Strain:   . Difficulty of Paying Living Expenses: Not on file  Food Insecurity:   . Worried About Charity fundraiser in the Last Year: Not on file  . Ran Out of Food in the Last Year: Not on file  Transportation Needs:   . Lack of Transportation (Medical): Not on file  . Lack of Transportation (Non-Medical): Not on file  Physical Activity:   . Days of Exercise per Week: Not on file  . Minutes of Exercise per Session: Not on file  Stress:   . Feeling of Stress : Not on file  Social Connections:   .  Frequency of Communication with Friends and Family: Not on file  . Frequency of Social Gatherings with Friends and Family: Not on file  . Attends Religious Services: Not on file  . Active Member of Clubs or Organizations: Not on file  . Attends Archivist Meetings: Not on file  . Marital Status: Not on file  Intimate Partner Violence:   . Fear of Current or Ex-Partner: Not on file  . Emotionally Abused: Not on file  . Physically Abused: Not on file  . Sexually Abused: Not on file     Family History  Problem Relation Age of Onset  . Alzheimer's disease Mother   . Cancer Mother   . Memory loss Father     ROS: [x]  Positive   [ ]  Negative   [ ]  All sytems reviewed and are negative  Cardiovascular: []  chest pain/pressure []  palpitations []  SOB lying flat []  DOE []  pain in legs while walking []  pain in legs at rest []  pain in legs at night []  non-healing ulcers []  hx of DVT [x]  swelling in legs (left)  Pulmonary: []  productive cough []  asthma/wheezing []  home O2  Neurologic: []  weakness in []  arms []  legs []  numbness in []  arms []  legs []  hx of CVA []  mini stroke [] difficulty speaking or slurred speech []  temporary loss of vision in one eye []  dizziness  Hematologic: []  hx of cancer []  bleeding problems []  problems with blood clotting  easily  Endocrine:   []  diabetes []  thyroid disease  GI []  vomiting blood []  blood in stool  GU: []  CKD/renal failure []  HD--[]  M/W/F or []  T/T/S []  burning with urination []  blood in urine  Psychiatric: []  anxiety []  depression  Musculoskeletal: []  arthritis []  joint pain  Integumentary: []  rashes []  ulcers  Constitutional: []  fever []  chills   Physical Examination  Vitals:   05/18/19 1830 05/18/19 1900  BP: (!) 165/104 (!) 159/104  Pulse:  89  Resp: (!) 21 16  Temp:    SpO2:  94%   Body mass index is 29.99 kg/m.  General:  WDWN in NAD Gait: Not observed HENT: WNL, normocephalic Pulmonary: normal non-labored breathing, without Rales, rhonchi,  wheezing Cardiac: regular, without  Murmurs, rubs or gallops Abdomen: soft, NT/ND, no masses Vascular Exam/Pulses: Palpable femoral pulses bilaterally Palpable DP pulses bilaterally Extremities: without ischemic changes, without Gangrene , without cellulitis; without open wounds;  Significant left lower extremity swelling Musculoskeletal: no muscle wasting or atrophy  Neurologic: A&O X 3; Appropriate Affect ; SENSATION: normal; MOTOR FUNCTION:  moving all extremities equally. Speech is fluent/normal   CBC    Component Value Date/Time   WBC 10.8 (H) 05/18/2019 1919   RBC 4.72 05/18/2019 1919   HGB 16.5 05/18/2019 1919   HGB 17.2 (H) 04/04/2019 1426   HCT 48.0 05/18/2019 1919   PLT 140 (L) 05/18/2019 1919   PLT 145 (L) 04/04/2019 1426   MCV 101.7 (H) 05/18/2019 1919   MCH 35.0 (H) 05/18/2019 1919   MCHC 34.4 05/18/2019 1919   RDW 14.1 05/18/2019 1919   LYMPHSABS 1.7 05/18/2019 1919   MONOABS 1.0 05/18/2019 1919   EOSABS 0.9 (H) 05/18/2019 1919   BASOSABS 0.1 05/18/2019 1919    BMET    Component Value Date/Time   NA 140 05/18/2019 1919   K 3.2 (L) 05/18/2019 1919   CL 99 05/18/2019 1919   CO2 28 05/18/2019 1919   GLUCOSE 101 (H) 05/18/2019 1919   BUN 11 05/18/2019 1919  CREATININE 1.11  05/18/2019 1919   CREATININE 1.09 04/12/2019 1032   CALCIUM 8.7 (L) 05/18/2019 1919   GFRNONAA >60 05/18/2019 1919   GFRNONAA >60 04/12/2019 1032   GFRAA >60 05/18/2019 1919   GFRAA >60 04/12/2019 1032    COAGS: Lab Results  Component Value Date   INR 0.9 05/10/2018   INR 1.01 05/03/2018     Non-Invasive Vascular Imaging:    Left lower extremity venous duplex shows extensive DVT in the left common iliac, external iliac, common femoral vein and remaining of the left lower extremity including popliteal and superficial femoral vein and tibial veins   ASSESSMENT/PLAN: This is a 58 y.o. 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  Patient told me in the ED that his lung cancer was treated and he no longer has cancer.  Unfortunately after reviewing the notes from the cancer center appears he had progression of his metastatic disease based on recent staging imaging (note from 04/12/19) and he was offered to resume chemotherapy versus referral to hospice palliative care.  This certainly complicates whether he would be a candidate for percutaneous mechanical thrombectomy and/or thrombolysis.  Would recommend admission for heparin with leg elevation.  He needs a CT of his abdomen pelvis to rule out any metastatic lesions that could be the etiology for this in the abdomen or pelvis.  Vascular surgery will continue to follow.  No evidence of phlegmasia.     Marty Heck, MD [name] Office: [place]

## 2019-05-18 NOTE — ED Notes (Addendum)
Paged vascular tech.

## 2019-05-19 DIAGNOSIS — J449 Chronic obstructive pulmonary disease, unspecified: Secondary | ICD-10-CM | POA: Diagnosis present

## 2019-05-19 DIAGNOSIS — C782 Secondary malignant neoplasm of pleura: Secondary | ICD-10-CM | POA: Diagnosis not present

## 2019-05-19 DIAGNOSIS — Z9119 Patient's noncompliance with other medical treatment and regimen: Secondary | ICD-10-CM | POA: Diagnosis not present

## 2019-05-19 DIAGNOSIS — C3401 Malignant neoplasm of right main bronchus: Secondary | ICD-10-CM

## 2019-05-19 DIAGNOSIS — Z7982 Long term (current) use of aspirin: Secondary | ICD-10-CM | POA: Diagnosis not present

## 2019-05-19 DIAGNOSIS — F329 Major depressive disorder, single episode, unspecified: Secondary | ICD-10-CM | POA: Diagnosis present

## 2019-05-19 DIAGNOSIS — I10 Essential (primary) hypertension: Secondary | ICD-10-CM | POA: Diagnosis present

## 2019-05-19 DIAGNOSIS — I82419 Acute embolism and thrombosis of unspecified femoral vein: Secondary | ICD-10-CM | POA: Diagnosis present

## 2019-05-19 DIAGNOSIS — F4310 Post-traumatic stress disorder, unspecified: Secondary | ICD-10-CM | POA: Diagnosis present

## 2019-05-19 DIAGNOSIS — I82452 Acute embolism and thrombosis of left peroneal vein: Secondary | ICD-10-CM | POA: Diagnosis present

## 2019-05-19 DIAGNOSIS — C781 Secondary malignant neoplasm of mediastinum: Secondary | ICD-10-CM

## 2019-05-19 DIAGNOSIS — G4733 Obstructive sleep apnea (adult) (pediatric): Secondary | ICD-10-CM

## 2019-05-19 DIAGNOSIS — Z809 Family history of malignant neoplasm, unspecified: Secondary | ICD-10-CM | POA: Diagnosis not present

## 2019-05-19 DIAGNOSIS — I495 Sick sinus syndrome: Secondary | ICD-10-CM

## 2019-05-19 DIAGNOSIS — C3491 Malignant neoplasm of unspecified part of right bronchus or lung: Secondary | ICD-10-CM | POA: Diagnosis present

## 2019-05-19 DIAGNOSIS — K219 Gastro-esophageal reflux disease without esophagitis: Secondary | ICD-10-CM

## 2019-05-19 DIAGNOSIS — R413 Other amnesia: Secondary | ICD-10-CM

## 2019-05-19 DIAGNOSIS — I82432 Acute embolism and thrombosis of left popliteal vein: Secondary | ICD-10-CM | POA: Diagnosis present

## 2019-05-19 DIAGNOSIS — I82402 Acute embolism and thrombosis of unspecified deep veins of left lower extremity: Secondary | ICD-10-CM

## 2019-05-19 DIAGNOSIS — E785 Hyperlipidemia, unspecified: Secondary | ICD-10-CM

## 2019-05-19 DIAGNOSIS — C799 Secondary malignant neoplasm of unspecified site: Secondary | ICD-10-CM | POA: Diagnosis not present

## 2019-05-19 DIAGNOSIS — I82412 Acute embolism and thrombosis of left femoral vein: Secondary | ICD-10-CM | POA: Diagnosis present

## 2019-05-19 DIAGNOSIS — Z820 Family history of epilepsy and other diseases of the nervous system: Secondary | ICD-10-CM | POA: Diagnosis not present

## 2019-05-19 DIAGNOSIS — C3490 Malignant neoplasm of unspecified part of unspecified bronchus or lung: Secondary | ICD-10-CM | POA: Diagnosis not present

## 2019-05-19 DIAGNOSIS — I82442 Acute embolism and thrombosis of left tibial vein: Secondary | ICD-10-CM | POA: Diagnosis present

## 2019-05-19 DIAGNOSIS — Z950 Presence of cardiac pacemaker: Secondary | ICD-10-CM | POA: Diagnosis not present

## 2019-05-19 DIAGNOSIS — M79605 Pain in left leg: Secondary | ICD-10-CM | POA: Diagnosis present

## 2019-05-19 DIAGNOSIS — Z20822 Contact with and (suspected) exposure to covid-19: Secondary | ICD-10-CM | POA: Diagnosis present

## 2019-05-19 DIAGNOSIS — Z79899 Other long term (current) drug therapy: Secondary | ICD-10-CM

## 2019-05-19 DIAGNOSIS — G40A09 Absence epileptic syndrome, not intractable, without status epilepticus: Secondary | ICD-10-CM | POA: Diagnosis present

## 2019-05-19 DIAGNOSIS — E7800 Pure hypercholesterolemia, unspecified: Secondary | ICD-10-CM | POA: Diagnosis present

## 2019-05-19 DIAGNOSIS — C771 Secondary and unspecified malignant neoplasm of intrathoracic lymph nodes: Secondary | ICD-10-CM | POA: Diagnosis present

## 2019-05-19 DIAGNOSIS — I82422 Acute embolism and thrombosis of left iliac vein: Secondary | ICD-10-CM | POA: Diagnosis not present

## 2019-05-19 DIAGNOSIS — F17220 Nicotine dependence, chewing tobacco, uncomplicated: Secondary | ICD-10-CM

## 2019-05-19 LAB — CBC
HCT: 44.2 % (ref 39.0–52.0)
Hemoglobin: 15.1 g/dL (ref 13.0–17.0)
MCH: 34.6 pg — ABNORMAL HIGH (ref 26.0–34.0)
MCHC: 34.2 g/dL (ref 30.0–36.0)
MCV: 101.1 fL — ABNORMAL HIGH (ref 80.0–100.0)
Platelets: 120 10*3/uL — ABNORMAL LOW (ref 150–400)
RBC: 4.37 MIL/uL (ref 4.22–5.81)
RDW: 14.1 % (ref 11.5–15.5)
WBC: 9.4 10*3/uL (ref 4.0–10.5)
nRBC: 0 % (ref 0.0–0.2)

## 2019-05-19 LAB — BASIC METABOLIC PANEL
Anion gap: 7 (ref 5–15)
BUN: 9 mg/dL (ref 8–23)
CO2: 33 mmol/L — ABNORMAL HIGH (ref 22–32)
Calcium: 8.3 mg/dL — ABNORMAL LOW (ref 8.9–10.3)
Chloride: 99 mmol/L (ref 98–111)
Creatinine, Ser: 1.13 mg/dL (ref 0.61–1.24)
GFR calc Af Amer: >60 mL/min (ref >60)
GFR calc non Af Amer: >60 mL/min (ref >60)
Glucose, Bld: 114 mg/dL — ABNORMAL HIGH (ref 70–99)
Potassium: 3.6 mmol/L (ref 3.5–5.1)
Sodium: 139 mmol/L (ref 135–145)

## 2019-05-19 LAB — HEPARIN LEVEL (UNFRACTIONATED)
Heparin Unfractionated: 0.17 IU/mL — ABNORMAL LOW (ref 0.30–0.70)
Heparin Unfractionated: 0.43 IU/mL (ref 0.30–0.70)
Heparin Unfractionated: 0.41 IU/mL (ref 0.30–0.70)

## 2019-05-19 LAB — SARS CORONAVIRUS 2 (TAT 6-24 HRS): SARS Coronavirus 2: NEGATIVE

## 2019-05-19 LAB — GLUCOSE, CAPILLARY: Glucose-Capillary: 121 mg/dL — ABNORMAL HIGH (ref 70–99)

## 2019-05-19 MED ORDER — HEPARIN BOLUS VIA INFUSION
3000.0000 [IU] | Freq: Once | INTRAVENOUS | Status: AC
  Administered 2019-05-19: 3000 [IU] via INTRAVENOUS
  Filled 2019-05-19: qty 3000

## 2019-05-19 MED ORDER — MAGNESIUM SULFATE 2 GM/50ML IV SOLN
2.0000 g | Freq: Once | INTRAVENOUS | Status: AC
  Administered 2019-05-19: 2 g via INTRAVENOUS
  Filled 2019-05-19: qty 50

## 2019-05-19 NOTE — Progress Notes (Signed)
Subjective: HD#0 Events Overnight: admitted overnight  Patient was seen this morning on rounds.  Patient resting comfortably in his room.  Patient states that he still having some pain and swelling in his left leg.  He said he has had some history of memory issues but states that he just has mild issues remembering appointments from time to time, but does not believe that he has true issues with memory.  She denies any other symptoms.  Objective:  Vital signs in last 24 hours: Vitals:   05/18/19 2200 05/18/19 2300 05/19/19 0027 05/19/19 0637  BP: 139/84 (!) 163/103 (!) 142/88 (!) 160/74  Pulse: 81 84 95 87  Resp: 18 17 19 20   Temp:   98.9 F (37.2 C) 97.7 F (36.5 C)  TempSrc:   Oral Oral  SpO2: 98% 95% 97% 97%  Weight:      Height:       Supplemental O2: None  Physical Exam: Physical Exam  Constitutional: He is oriented to person, place, and time and well-developed, well-nourished, and in no distress.  HENT:  Head: Normocephalic and atraumatic.  Eyes: EOM are normal.  Cardiovascular: Normal rate and intact distal pulses.  Pulmonary/Chest: Effort normal and breath sounds normal.  Abdominal: Soft. Bowel sounds are normal.  Musculoskeletal:        General: Tenderness (left) and edema (left) present. Normal range of motion.     Cervical back: Normal range of motion.  Neurological: He is alert and oriented to person, place, and time.  Skin: Skin is warm and dry.    Filed Weights   05/18/19 1900  Weight: 100.3 kg     Intake/Output Summary (Last 24 hours) at 05/19/2019 1058 Last data filed at 05/19/2019 0400 Gross per 24 hour  Intake 404.58 ml  Output -  Net 404.58 ml    Risk Score: n/a   Pertinent labs/Imaging: CBC Latest Ref Rng & Units 05/19/2019 05/18/2019 04/04/2019  WBC 4.0 - 10.5 K/uL 9.4 10.8(H) 11.8(H)  Hemoglobin 13.0 - 17.0 g/dL 15.1 16.5 17.2(H)  Hematocrit 39.0 - 52.0 % 44.2 48.0 49.6  Platelets 150 - 400 K/uL 120(L) 140(L) 145(L)    CMP Latest Ref  Rng & Units 05/19/2019 05/18/2019 04/12/2019  Glucose 70 - 99 mg/dL 114(H) 101(H) 99  BUN 8 - 23 mg/dL 9 11 10   Creatinine 0.61 - 1.24 mg/dL 1.13 1.11 1.09  Sodium 135 - 145 mmol/L 139 140 138  Potassium 3.5 - 5.1 mmol/L 3.6 3.2(L) 3.6  Chloride 98 - 111 mmol/L 99 99 100  CO2 22 - 32 mmol/L 33(H) 28 30  Calcium 8.9 - 10.3 mg/dL 8.3(L) 8.7(L) 9.0  Total Protein 6.5 - 8.1 g/dL - - -  Total Bilirubin 0.3 - 1.2 mg/dL - - -  Alkaline Phos 38 - 126 U/L - - -  AST 15 - 41 U/L - - -  ALT 0 - 44 U/L - - -       Assessment/Plan:  Active Problems:   DVT (deep venous thrombosis) (HCC)    Patient Summary: Daniel Bryant is a 59 y.o. with pertinent PMH of sick sinus syndrome s/p pacemaker, COPD, GERD, hyperlipidemia, HTN, OSA, stage IV non-small cell lung cancer (DX 03/2018) who presented with left lower extremity swelling and pain and admit for deep vein thrombosis on hospital day 0  #Left lower extremity DVT  DVT extending from common iliac/external iliac down to the common femoral down the leg through to the left popliteal and superficial veins. -Continue  therapeutic heparin -Surgery following plan to take patient to the OR tomorrow. -N.p.o. at midnight -Bedrest  #Stage IV metastatic non-small cell lung cancer - Patient will need to follow-up with oncology in the outpatient setting.  #Short-term memory loss Patient has a history of memory impairment.  He seems to be answering questions appropriately with normal affect and mood. -Mini-Mental status exam performed today. 26/30. Seems to have poor insight into his conditions.  Diet: Normal IVF: None,None VTE: Heparin Code: Full PT/OT recs: Pending TOC recs: Pending   Dispo: Anticipated discharge pending clinical improvement.    Marianna Payment, D.O. [facility], PGY-1 Date 05/19/2019 Time 10:58 AM

## 2019-05-19 NOTE — Progress Notes (Signed)
Admitted thru ED with LLE DVT. On arrival, AOX20. MAE, LLE swelling. Vital signs checked stable. Oriented to room and unit. Placed on cardiac monitor and CCMD notified.

## 2019-05-19 NOTE — Progress Notes (Signed)
[place] for heparin Indication: DVT  Allergies  Allergen Reactions  . Other Other (See Comments)    Patient states "I had a scrotal procedure a couple of years ago, there was something they gave me to relax me and I started freaking out and seeing things".  Unable to ascertain from pt or his records what medication was.    Patient Measurements: Height: 6' (182.9 cm) Weight: 221 lb 1.6 oz (100.3 kg) IBW/kg (Calculated) : 77.6 Heparin Dosing Weight: 98kg  Vital Signs: Temp: 98.3 F (36.8 C) (03/07 1240) Temp Source: Oral (03/07 1240) BP: 151/84 (03/07 1240) Pulse Rate: 80 (03/07 1240)  Labs: Recent Labs    05/18/19 1919 05/19/19 0336 05/19/19 1013 05/19/19 1634  HGB 16.5 15.1  --   --   HCT 48.0 44.2  --   --   PLT 140* 120*  --   --   HEPARINUNFRC  --  0.17* 0.43 0.41  CREATININE 1.11 1.13  --   --     Estimated Creatinine Clearance: 84.2 mL/min (by C-G formula based on SCr of 1.13 mg/dL).  Assessment: 85 yom presented on 05/18/2019 with leg pain and swelling. 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 Pharmacy consulted to dose heparin. Of note, patient has stage IV non small cell lung cancer and is not on anticoagulation prior to admission.   Heparin level remains therapeutic on 1900 units/hr.    Goal of Therapy:  Heparin level 0.3-0.7 units/ml Monitor platelets by anticoagulation protocol: Yes   Plan:  Continue heparin at 1900 units/hr  Daily heparin level and CBC  Salome Arnt, PharmD, BCPS Clinical Pharmacist Please see AMION for all pharmacy numbers 05/19/2019 5:44 PM

## 2019-05-19 NOTE — Progress Notes (Signed)
[place] for heparin Indication: DVT  Allergies  Allergen Reactions  . Other Other (See Comments)    Patient states "I had a scrotal procedure a couple of years ago, there was something they gave me to relax me and I started freaking out and seeing things".  Unable to ascertain from pt or his records what medication was.    Patient Measurements: Height: 6' (182.9 cm) Weight: 221 lb 1.6 oz (100.3 kg) IBW/kg (Calculated) : 77.6 Heparin Dosing Weight: 98kg  Vital Signs: Temp: 97.7 F (36.5 C) (03/07 0637) Temp Source: Oral (03/07 0637) BP: 160/74 (03/07 7654) Pulse Rate: 87 (03/07 0637)  Labs: Recent Labs    05/18/19 1919 05/19/19 0336  HGB 16.5 15.1  HCT 48.0 44.2  PLT 140* 120*  HEPARINUNFRC  --  0.17*  CREATININE 1.11 1.13    Estimated Creatinine Clearance: 84.2 mL/min (by C-G formula based on SCr of 1.13 mg/dL).  Assessment: 23 yom presented on 05/18/2019 with leg pain and swelling. 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 Pharmacy consulted to dose heparin. Of note, patient has stage IV non small cell lung cancer and is not on anticoagulation prior to admission.   Heparin level 0.43 on heparin 1900 units/hr is therapeutic. Hgb 15.1. Plt 120 with baseline ~140. No reported bleeding. Per Vascular Surgery, plan for percutaneous mechanical thrombectomy with venogram tomorrow.    Goal of Therapy:  Heparin level 0.3-0.7 units/ml Monitor platelets by anticoagulation protocol: Yes   Plan:  Continue heparin at 1900 units/hr  Check heparin level at 1630  Monitor heparin level, CBC, and S/S of bleeding daily   Cristela Felt, PharmD PGY1 Pharmacy Resident [employer]: [phone]  05/19/2019,8:27 AM

## 2019-05-19 NOTE — Progress Notes (Signed)
[facility] [place]  Subjective  -states he is still having a lot of pain and swelling in his left leg   Objective (!) 160/74 87 97.7 F (36.5 C) (Oral) 20 97%  Intake/Output Summary (Last 24 hours) at 05/19/2019 0936 Last data filed at 05/19/2019 0400 Gross per 24 hour  Intake 404.58 ml  Output -  Net 404.58 ml    Left calf compartments soft Left DP palpable Significant left leg swelling  Laboratory Lab Results: Recent Labs    05/18/19 1919 05/19/19 0336  WBC 10.8* 9.4  HGB 16.5 15.1  HCT 48.0 44.2  PLT 140* 120*   BMET Recent Labs    05/18/19 1919 05/19/19 0336  NA 140 139  K 3.2* 3.6  CL 99 99  CO2 28 33*  GLUCOSE 101* 114*  BUN 11 9  CREATININE 1.11 1.13  CALCIUM 8.7* 8.3*    COAG Lab Results  Component Value Date   INR 0.9 05/10/2018   INR 1.01 05/03/2018   No results found for: PTT  Assessment/Planning:  59 year old male admitted with extensive left lower extremity DVT including iliofemoral involvement.  He has been on heparin with leg elevation overnight with some minimal improvement but states he is still having a lot of pain and swelling.  I have discussed possible percutaneous mechanical thrombectomy with venogram tomorrow in the Cath Lab to alleviate his symptoms.  This could be potentially more of a palliative approach given his stage IV metastatic non-small cell lung cancer.  In discussing his cancer with him again this morning he states he is under the impression that his cancer is cured.  Certainly the most recent oncology notes indicate progression on a staging scan with stage IV metastatic disease as noted above.  Certainly will probably need some involvement from oncology and states he wants to keep fighting and resume chemotherapy.  Please keep NPO after midnight.  Marty Heck 05/19/2019 9:36 AM --

## 2019-05-19 NOTE — Progress Notes (Signed)
[place] for heparin Indication: DVT  Allergies  Allergen Reactions  . Other Other (See Comments)    Patient states "I had a scrotal procedure a couple of years ago, there was something they gave me to relax me and I started freaking out and seeing things".  Unable to ascertain from pt or his records what medication was.    Patient Measurements: Height: 6' (182.9 cm) Weight: 221 lb 1.6 oz (100.3 kg) IBW/kg (Calculated) : 77.6 Heparin Dosing Weight: 98kg  Vital Signs: Temp: 98.9 F (37.2 C) (03/07 0027) Temp Source: Oral (03/07 0027) BP: 142/88 (03/07 0027) Pulse Rate: 95 (03/07 0027)  Labs: Recent Labs    05/18/19 1919 05/19/19 0336  HGB 16.5 15.1  HCT 48.0 44.2  PLT 140* 120*  HEPARINUNFRC  --  0.17*  CREATININE 1.11  --     Estimated Creatinine Clearance: 85.7 mL/min (by C-G formula based on SCr of 1.11 mg/dL).  Assessment: 14 yom presented to the ED with leg pain and swelling. To start IV heparin for DVT treatment. Baseline Hgb is WNL and platelets are slightly low. He is not on anticoagulation PTA.   Heparin level subtherapeutic (0.17) on gtt at 1600 units/hr. No issues with line or bleeding reported per RN.  Goal of Therapy:  Heparin level 0.3-0.7 units/ml Monitor platelets by anticoagulation protocol: Yes   Plan:  Rebolus heparin 3000 units Increase heparin gtt to 1900 units/hr Check a 6 hr heparin level  Sherlon Handing, PharmD, BCPS Please see amion for complete clinical pharmacist phone list 05/19/2019,4:14 AM

## 2019-05-20 ENCOUNTER — Encounter (HOSPITAL_COMMUNITY): Payer: Self-pay | Admitting: Internal Medicine

## 2019-05-20 ENCOUNTER — Encounter (HOSPITAL_COMMUNITY)
Admission: EM | Disposition: A | Discharge status: Home / Self Care | Payer: Self-pay | Source: Home / Self Care | Attending: Internal Medicine

## 2019-05-20 DIAGNOSIS — C3490 Malignant neoplasm of unspecified part of unspecified bronchus or lung: Secondary | ICD-10-CM

## 2019-05-20 DIAGNOSIS — I82412 Acute embolism and thrombosis of left femoral vein: Secondary | ICD-10-CM

## 2019-05-20 DIAGNOSIS — I82432 Acute embolism and thrombosis of left popliteal vein: Secondary | ICD-10-CM

## 2019-05-20 DIAGNOSIS — I82422 Acute embolism and thrombosis of left iliac vein: Secondary | ICD-10-CM

## 2019-05-20 DIAGNOSIS — I82442 Acute embolism and thrombosis of left tibial vein: Secondary | ICD-10-CM

## 2019-05-20 DIAGNOSIS — C799 Secondary malignant neoplasm of unspecified site: Secondary | ICD-10-CM

## 2019-05-20 HISTORY — PX: LOWER EXTREMITY VENOGRAPHY: CATH118253

## 2019-05-20 HISTORY — PX: PERIPHERAL VASCULAR BALLOON ANGIOPLASTY: CATH118281

## 2019-05-20 LAB — CBC
HCT: 43.2 % (ref 39.0–52.0)
Hemoglobin: 15.3 g/dL (ref 13.0–17.0)
MCH: 34.9 pg — ABNORMAL HIGH (ref 26.0–34.0)
MCHC: 35.4 g/dL (ref 30.0–36.0)
MCV: 98.6 fL (ref 80.0–100.0)
Platelets: 126 10*3/uL — ABNORMAL LOW (ref 150–400)
RBC: 4.38 MIL/uL (ref 4.22–5.81)
RDW: 13.5 % (ref 11.5–15.5)
WBC: 8.5 10*3/uL (ref 4.0–10.5)
nRBC: 0 % (ref 0.0–0.2)

## 2019-05-20 LAB — SURGICAL PCR SCREEN
MRSA, PCR: NEGATIVE
Staphylococcus aureus: NEGATIVE

## 2019-05-20 LAB — HEPARIN LEVEL (UNFRACTIONATED)
Heparin Unfractionated: 0.31 IU/mL (ref 0.30–0.70)
Heparin Unfractionated: 0.56 IU/mL (ref 0.30–0.70)

## 2019-05-20 SURGERY — LOWER EXTREMITY VENOGRAPHY
Anesthesia: LOCAL | Laterality: Left

## 2019-05-20 MED ORDER — SODIUM CHLORIDE 0.9 % IV SOLN: 250.0000 mL | INTRAVENOUS | Status: DC | PRN

## 2019-05-20 MED ORDER — IODIXANOL 320 MG/ML IV SOLN
INTRAVENOUS | Status: DC | PRN
  Administered 2019-05-20: 20 mL via INTRAVENOUS

## 2019-05-20 MED ORDER — HEPARIN SODIUM (PORCINE) 1000 UNIT/ML IJ SOLN
INTRAMUSCULAR | Status: AC
  Filled 2019-05-20: qty 1

## 2019-05-20 MED ORDER — LABETALOL HCL 5 MG/ML IV SOLN: 10.0000 mg | INTRAVENOUS | Status: DC | PRN

## 2019-05-20 MED ORDER — MIDAZOLAM HCL 2 MG/2ML IJ SOLN
INTRAMUSCULAR | Status: AC
Start: 2019-05-20 — End: ?
  Filled 2019-05-20: qty 2

## 2019-05-20 MED ORDER — ONDANSETRON HCL 4 MG/2ML IJ SOLN: 4.0000 mg | Freq: Four times a day (QID) | INTRAMUSCULAR | Status: DC | PRN

## 2019-05-20 MED ORDER — ACETAMINOPHEN 325 MG PO TABS: 650.0000 mg | ORAL_TABLET | ORAL | Status: DC | PRN

## 2019-05-20 MED ORDER — SODIUM CHLORIDE 0.9% FLUSH
3.0000 mL | Freq: Two times a day (BID) | INTRAVENOUS | Status: DC
  Administered 2019-05-20: 3 mL via INTRAVENOUS

## 2019-05-20 MED ORDER — MIDAZOLAM HCL 2 MG/2ML IJ SOLN
INTRAMUSCULAR | Status: DC | PRN
  Administered 2019-05-20 (×2): 1 mg via INTRAVENOUS

## 2019-05-20 MED ORDER — FENTANYL CITRATE (PF) 100 MCG/2ML IJ SOLN
INTRAMUSCULAR | Status: AC
  Filled 2019-05-20: qty 2

## 2019-05-20 MED ORDER — HEPARIN (PORCINE) IN NACL 1000-0.9 UT/500ML-% IV SOLN
INTRAVENOUS | Status: DC | PRN
  Administered 2019-05-20: 500 mL

## 2019-05-20 MED ORDER — SODIUM CHLORIDE 0.9 % IV SOLN: INTRAVENOUS | Status: AC

## 2019-05-20 MED ORDER — HEPARIN (PORCINE) IN NACL 1000-0.9 UT/500ML-% IV SOLN
INTRAVENOUS | Status: AC
  Filled 2019-05-20: qty 500

## 2019-05-20 MED ORDER — HEPARIN (PORCINE) 25000 UT/250ML-% IV SOLN
1900.0000 [IU]/h | INTRAVENOUS | Status: DC
  Administered 2019-05-20 – 2019-05-21 (×3): 1900 [IU]/h via INTRAVENOUS
  Filled 2019-05-20 (×2): qty 250

## 2019-05-20 MED ORDER — LIDOCAINE HCL (PF) 1 % IJ SOLN
INTRAMUSCULAR | Status: DC | PRN
  Administered 2019-05-20: 10 mL via INTRADERMAL

## 2019-05-20 MED ORDER — HEPARIN SODIUM (PORCINE) 1000 UNIT/ML IJ SOLN
INTRAMUSCULAR | Status: DC | PRN
  Administered 2019-05-20: 5000 [IU] via INTRAVENOUS
  Administered 2019-05-20: 8000 [IU] via INTRAVENOUS

## 2019-05-20 MED ORDER — FENTANYL CITRATE (PF) 100 MCG/2ML IJ SOLN
INTRAMUSCULAR | Status: DC | PRN
  Administered 2019-05-20 (×2): 50 ug via INTRAVENOUS

## 2019-05-20 MED ORDER — SODIUM CHLORIDE 0.9 % IV SOLN: INTRAVENOUS | Status: DC

## 2019-05-20 MED ORDER — HYDRALAZINE HCL 20 MG/ML IJ SOLN: 5.0000 mg | INTRAMUSCULAR | Status: DC | PRN

## 2019-05-20 MED ORDER — SODIUM CHLORIDE 0.9% FLUSH: 3.0000 mL | INTRAVENOUS | Status: DC | PRN

## 2019-05-20 SURGICAL SUPPLY — 18 items
BAG SNAP BAND KOVER 36X36 (MISCELLANEOUS) ×2 IMPLANT
BALLN MUSTANG 8X60X75 (BALLOONS) ×2
BALLOON MUSTANG 8X60X75 (BALLOONS) ×1 IMPLANT
CATH ANGIO 5F BER2 100CM (CATHETERS) ×2 IMPLANT
CATH RETRIEVER CLOT 16MMX105CM (CATHETERS) ×2 IMPLANT
CATH VISIONS PV .035 IVUS (CATHETERS) ×2 IMPLANT
COVER DOME SNAP 22 D (MISCELLANEOUS) ×2 IMPLANT
GLIDEWIRE ADV .035X260CM (WIRE) ×2 IMPLANT
KIT ENCORE 26 ADVANTAGE (KITS) ×2 IMPLANT
KIT MICROPUNCTURE NIT STIFF (SHEATH) ×4 IMPLANT
PROTECTION STATION PRESSURIZED (MISCELLANEOUS) ×2
SHEATH CLOT RETRIEVER (SHEATH) ×2 IMPLANT
SHEATH PINNACLE 8F 10CM (SHEATH) ×2 IMPLANT
SHEATH PROBE COVER 6X72 (BAG) ×2 IMPLANT
STATION PROTECTION PRESSURIZED (MISCELLANEOUS) ×1 IMPLANT
TRAY PV CATH (CUSTOM PROCEDURE TRAY) ×2 IMPLANT
WIRE HITORQ VERSACORE ST 145CM (WIRE) ×2 IMPLANT
WIRE TORQFLEX AUST .018X40CM (WIRE) ×2 IMPLANT

## 2019-05-20 NOTE — Progress Notes (Signed)
[name] for heparin Indication: DVT  Allergies  Allergen Reactions  . Other Other (See Comments)    Patient states "I had a scrotal procedure a couple of years ago, there was something they gave me to relax me and I started freaking out and seeing things".  Unable to ascertain from pt or his records what medication was.    Patient Measurements: Height: 6' (182.9 cm) Weight: 220 lb 14.4 oz (100.2 kg) IBW/kg (Calculated) : 77.6 Heparin Dosing Weight: 98 kg  Vital Signs: Temp: 98.4 F (36.9 C) (03/08 1314) Temp Source: Oral (03/08 1314) BP: 140/87 (03/08 1314) Pulse Rate: 81 (03/08 1314)  Labs: Recent Labs    05/18/19 1919 05/18/19 1919 05/19/19 0336 05/19/19 1013 05/19/19 1634 05/20/19 0331 05/20/19 1817  HGB 16.5   < > 15.1  --   --  15.3  --   HCT 48.0  --  44.2  --   --  43.2  --   PLT 140*  --  120*  --   --  126*  --   HEPARINUNFRC  --   --  0.17*   < > 0.41 0.31 0.56  CREATININE 1.11  --  1.13  --   --   --   --    < > = values in this interval not displayed.    Estimated Creatinine Clearance: 84.1 mL/min (by C-G formula based on SCr of 1.13 mg/dL).  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 Pharmacy was consulted to dose heparin. Of note, patient has stage IV non small cell lung cancer and was not on anticoagulation prior to admission.   Heparin level (0.31 units/ml) was within goal range earlier today on infusion on heparin at 1900 units/hr. Pt is now S/P angioplasty and mechanical thrombectomy in vascular lab today. Pharmacy was asked to restart heparin today at 1100; heparin infusion was resumed at 1900 units/hr with no bolus. Heparin level ~7.5 hrs after heparin infusion was resumed at 1900 units/hr was 0.56 units/ml, which is within the goal range for this pt.  H/H WNL, platelets 126. Per RN, no  issues with IV or bleeding observed.  Goal of Therapy:  Heparin level 0.3-0.7 units/ml Monitor platelets by anticoagulation protocol: Yes   Plan:  Continue heparin at 1900 units/hr Check confirmatory heparin level in 6 hrs Monitor daily heparin level, CBC Monitor for signs/symptoms of bleeding  Gillermina Hu, PharmD, BCPS, [facility] Clinical Pharmacist 05/20/2019, 19:05 PM

## 2019-05-20 NOTE — Progress Notes (Signed)
MOBILITY TEAM - Progress Note   05/20/19 1400  Mobility  Activity Ambulated in hall  Level of Assistance Independent  Assistive Device None  Distance Ambulated (ft) 490 ft  Mobility Response Tolerated well   Post-activity: HR 90, SpO2 97% on RA  Pt independent with mobility, able to manage lines/IV pole. Educ re: edema control, importance of mobility.  Mabeline Caras, PT, DPT Mobility Team Pager [phone]

## 2019-05-20 NOTE — Progress Notes (Signed)
Pt alert and oriented. Pt verbalize understanding carplan and procedure he will undergo. Pre-procedural tasks completed. Report given to [employer] in cath lab.

## 2019-05-20 NOTE — Op Note (Addendum)
Patient name: Daniel Bryant MRN: [id number] DOB: [birth year] Sex: male  05/20/2019 Pre-operative Diagnosis: Extensive left lower extremity DVT Post-operative diagnosis:  Same Surgeon:  Erlene Quan C. Donzetta Matters, MD Procedure Performed: 1.  Ultrasound-guided cannulation left popliteal vein 2.  Intravascular ultrasound of inferior vena cava, left common external leg veins, left common femoral vein and femoral vein 3.  Left lower extremity venography and central venography 4.  Inari mechanical thrombectomy of left lower extremity including femoral vein, common femoral vein, left external and common iliac veins and IVC 5.  Balloon angioplasty of left femoral vein, left common femoral vein, left common and external iliac veins 6.  Moderate sedation with fentanyl Versed for 72 minutes   Indications: 59 year old male with history of advanced lung cancer.  Now has signs of left lower extremity DVT.  He is indicated for the above procedure.  Findings: There is acute and chronic appearing thrombus by both intravascular ultrasound and grossly after mechanical thrombectomy.  This extended from the femoral vein which appeared occluded initially up to the common femoral vein where there was significant disease profunda was likely patent prior.  The common and external iliac veins were occluded and there was a tongue of thrombus into the IVC initially.  After mechanical thrombectomy and balloon angioplasty the common and external iliac veins were patent there was no further clot in the IVC.  There was probably 50% chronic appearing thrombus in the common femoral vein the profunda appeared patent.  There was no reflux.  There was a channel through the femoral vein minimal by IVUS but good flow by venography.   Procedure:  The patient was identified in the holding area and taken to room 8.  The patient was then placed supine on the table and prepped and draped in the usual sterile fashion.  A time out was called.   Ultrasound was used to evaluate the left popliteal vein.  This was not compressible.  Initially cannulated micropuncture needle and attempted to pass a wire but could not.  I then remove the wire needle.  I then use 18-gauge needle was able to get a versa core wire to pass enough to get a micropuncture sheath in place and then placed a Glidewire advantage.  An 8 French sheath was placed.  His heparin drip was stopped and 8000 use of heparin were given.  We then gave an additional 5000.  Using Glidewire [place] catheter I was able to reverse centrally confirmed IVC access.  The wire was placed all the way to the IJ vein.  We performed intravascular ultrasound with the above findings.  We then exchanged for the mechanical thrombectomy sheath.  I did have the balloon this open with 8 balloon.  We then performed mechanical thrombectomy with 6 separate passes.  I did balloon angioplasty the femoral vein as well as the common femoral vein and common and external iliac veins with a balloon.  Completion intravascular ultrasound demonstrated much improvement in flow from the common femoral vein into the external and common leg veins no further clot in the IVC.  There was only minimal channel in the femoral vein.  Completion venography actually demonstrated a large channel in the femoral vein with no reflux.  Satisfied with this I elected not to lyse the patient there is no place to stent.  I removed the sheath and sutured a bolster in place.  He tolerated procedure without immediate complication.  Contrast: 20cc   Avid Guillette C. Donzetta Matters, MD [facility] [place] Office: [phone] Pager: [phone]

## 2019-05-20 NOTE — Progress Notes (Signed)
PT Cancellation Note  Patient Details Name: Marti Acebo MRN: [id number] DOB: 10/15/1960   Cancelled Treatment:    Reason Eval/Treat Not Completed: PT screened, no needs identified, will sign off.   Mabeline Caras, PT, DPT Acute Rehabilitation Services  Pager [phone] Office [place] 05/20/2019, 2:17 PM

## 2019-05-20 NOTE — Progress Notes (Signed)
Subjective: HD#1 Events Overnight: No events overnight  Patient was seen this morning on rounds.  Patient resting comfortably in bed.  He states that the procedure went well.  He denies any pain.  He denies chest pain, shortness of breath, nominal pain.  He appears comfortable on room air.  During today's interview the patient once again made a reference to his past cancer history.  I counseled him on his ongoing/active cancer.  I did reach out to his oncologist to secure a follow-up appointment to discuss further evaluation and management of his active cancer.     Objective:  Vital signs in last 24 hours: Vitals:   05/19/19 1240 05/19/19 1836 05/20/19 0038 05/20/19 0524  BP: (!) 151/84 (!) 153/80 123/90 (!) 141/94  Pulse: 80 81 79 72  Resp: 20 20 16 17   Temp: 98.3 F (36.8 C) 98.4 F (36.9 C) 98.5 F (36.9 C) 98.2 F (36.8 C)  TempSrc: Oral Oral Oral Oral  SpO2: 96% 97% 96% 97%  Weight:      Height:       Supplemental O2: Room air   Physical Exam: Physical Exam  Filed Weights   05/18/19 1900  Weight: 100.3 kg     Intake/Output Summary (Last 24 hours) at 05/20/2019 0641 Last data filed at 05/20/2019 0600 Gross per 24 hour  Intake 525.28 ml  Output --  Net 525.28 ml    Risk Score:    Pertinent labs/Imaging: CBC Latest Ref Rng & Units 05/20/2019 05/19/2019 05/18/2019  WBC 4.0 - 10.5 K/uL 8.5 9.4 10.8(H)  Hemoglobin 13.0 - 17.0 g/dL 15.3 15.1 16.5  Hematocrit 39.0 - 52.0 % 43.2 44.2 48.0  Platelets 150 - 400 K/uL 126(L) 120(L) 140(L)    CMP Latest Ref Rng & Units 05/19/2019 05/18/2019 04/12/2019  Glucose 70 - 99 mg/dL 114(H) 101(H) 99  BUN 8 - 23 mg/dL 9 11 10   Creatinine 0.61 - 1.24 mg/dL 1.13 1.11 1.09  Sodium 135 - 145 mmol/L 139 140 138  Potassium 3.5 - 5.1 mmol/L 3.6 3.2(L) 3.6  Chloride 98 - 111 mmol/L 99 99 100  CO2 22 - 32 mmol/L 33(H) 28 30  Calcium 8.9 - 10.3 mg/dL 8.3(L) 8.7(L) 9.0  Total Protein 6.5 - 8.1 g/dL - - -  Total Bilirubin 0.3 - 1.2 mg/dL - - -   Alkaline Phos 38 - 126 U/L - - -  AST 15 - 41 U/L - - -  ALT 0 - 44 U/L - - -    No results found.   Assessment/Plan:  Active Problems:   DVT (deep venous thrombosis) (HCC)   Dvt femoral (deep venous thrombosis) ([place])    Patient Summary: Daniel Bryant is a 59 y.o. with pertinent PMH of sick sinus syndrome s/p pacemaker, COPD, GERD, hyperlipidemia, HTN, OSA, stage IV non-small cell lung cancer (DX 03/2018) who presented with left lower extremity swelling and pain and admit for deep vein thrombosis on hospital day 1   #Left lower extremity DVT  -Status post mechanical thrombectomy with balloon angioplasty. -Continue heparin therapy at this time.  #Stage IV metastatic non-small cell lung cancer - Patient will need to follow-up with oncology in the outpatient setting.  #Short-term memory loss -Patient will likely need evaluation by neuropsychiatry in the outpatient setting.  He is at high risk for noncompliance/not following up with his subsequent office visits and therefore will need close follow-up with a PCP.  Diet: NPO IVF: None,None VTE: Heparin Code: Full PT/OT recs: Pending  TOC recs: Pending   Dispo: Anticipated discharge likely in the next day or 2.    Marianna Payment, D.O. [facility], PGY-1 Date 05/20/2019 Time 6:41 AM

## 2019-05-20 NOTE — Progress Notes (Signed)
[place] for heparin Indication: DVT  Allergies  Allergen Reactions  . Other Other (See Comments)    Patient states "I had a scrotal procedure a couple of years ago, there was something they gave me to relax me and I started freaking out and seeing things".  Unable to ascertain from pt or his records what medication was.    Patient Measurements: Height: 6' (182.9 cm) Weight: 221 lb 1.6 oz (100.3 kg) IBW/kg (Calculated) : 77.6 Heparin Dosing Weight: 98kg  Vital Signs: Temp: 98.2 F (36.8 C) (03/08 0524) Temp Source: Oral (03/08 0524) BP: 158/108 (03/08 0932) Pulse Rate: 0 (03/08 0937)  Labs: Recent Labs    05/18/19 1919 05/18/19 1919 05/19/19 0336 05/19/19 0336 05/19/19 1013 05/19/19 1634 05/20/19 0331  HGB 16.5   < > 15.1  --   --   --  15.3  HCT 48.0  --  44.2  --   --   --  43.2  PLT 140*  --  120*  --   --   --  126*  HEPARINUNFRC  --   --  0.17*   < > 0.43 0.41 0.31  CREATININE 1.11  --  1.13  --   --   --   --    < > = values in this interval not displayed.    Estimated Creatinine Clearance: 84.2 mL/min (by C-G formula based on SCr of 1.13 mg/dL).  Assessment: 16 yom presented on 05/18/2019 with leg pain and swelling. 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 Pharmacy consulted to dose heparin. Of note, patient has stage IV non small cell lung cancer and is not on anticoagulation prior to admission.   Heparin level therapeutic this morning prior to OR. Pt now s/p angioplasty and mechanical thrombectomy in vascular lab. Pharmacy asked to restart heparin today at 1100.  Goal of Therapy:  Heparin level 0.3-0.7 units/ml Monitor platelets by anticoagulation protocol: Yes   Plan:  -Resume heparin 1900 units/h at 1100 no bolus -Recheck heparin level in 6hr after restart   Arrie Senate, PharmD, BCPS Clinical Pharmacist [phone] Please check AMION for all [place]  numbers 05/20/2019

## 2019-05-20 NOTE — Progress Notes (Addendum)
Pt received from cath lab to 4E27. Oriented to room and call bell. Tele box applied and CCMD called. VSS. CHG wipes not available at this time - will apply once they are restocked. Will continue to monitor.  Arletta Bale, RN

## 2019-05-21 ENCOUNTER — Inpatient Hospital Stay: Payer: No Typology Code available for payment source

## 2019-05-21 ENCOUNTER — Inpatient Hospital Stay: Payer: No Typology Code available for payment source | Admitting: Internal Medicine

## 2019-05-21 DIAGNOSIS — I82422 Acute embolism and thrombosis of left iliac vein: Secondary | ICD-10-CM

## 2019-05-21 DIAGNOSIS — I82412 Acute embolism and thrombosis of left femoral vein: Principal | ICD-10-CM

## 2019-05-21 LAB — BASIC METABOLIC PANEL
Anion gap: 9 (ref 5–15)
BUN: 6 mg/dL — ABNORMAL LOW (ref 8–23)
CO2: 25 mmol/L (ref 22–32)
Calcium: 8.3 mg/dL — ABNORMAL LOW (ref 8.9–10.3)
Chloride: 104 mmol/L (ref 98–111)
Creatinine, Ser: 1.06 mg/dL (ref 0.61–1.24)
GFR calc Af Amer: >60 mL/min (ref >60)
GFR calc non Af Amer: >60 mL/min (ref >60)
Glucose, Bld: 97 mg/dL (ref 70–99)
Potassium: 3.7 mmol/L (ref 3.5–5.1)
Sodium: 138 mmol/L (ref 135–145)

## 2019-05-21 LAB — CBC
HCT: 45.1 % (ref 39.0–52.0)
Hemoglobin: 15.5 g/dL (ref 13.0–17.0)
MCH: 34.6 pg — ABNORMAL HIGH (ref 26.0–34.0)
MCHC: 34.4 g/dL (ref 30.0–36.0)
MCV: 100.7 fL — ABNORMAL HIGH (ref 80.0–100.0)
Platelets: 131 10*3/uL — ABNORMAL LOW (ref 150–400)
RBC: 4.48 MIL/uL (ref 4.22–5.81)
RDW: 13.6 % (ref 11.5–15.5)
WBC: 8.9 10*3/uL (ref 4.0–10.5)
nRBC: 0 % (ref 0.0–0.2)

## 2019-05-21 LAB — HEPARIN LEVEL (UNFRACTIONATED): Heparin Unfractionated: 0.4 IU/mL (ref 0.30–0.70)

## 2019-05-21 MED ORDER — ENOXAPARIN SODIUM 150 MG/ML ~~LOC~~ SOLN
1.5000 mg/kg | SUBCUTANEOUS | Status: DC
  Administered 2019-05-21: 150 mg via SUBCUTANEOUS
  Filled 2019-05-21: qty 1

## 2019-05-21 MED ORDER — ENOXAPARIN SODIUM 100 MG/ML ~~LOC~~ SOLN: 1.5000 mg/kg | SUBCUTANEOUS | 3 refills | Status: DC

## 2019-05-21 MED ORDER — ENOXAPARIN SODIUM 100 MG/ML ~~LOC~~ SOLN: 1.5000 mg/kg | SUBCUTANEOUS | 0 refills | Status: DC

## 2019-05-21 MED FILL — ENOXAPARIN SODIUM 150 MG/ML: 150 | 28 days supply | Qty: 28 | Fill #0

## 2019-05-21 NOTE — Progress Notes (Addendum)
Progress Note    05/21/2019 7:22 AM 1 Day Post-Op  Subjective: No complaints this morning  Status post left lower extremity and artery mechanical thrombectomy and balloon angioplasty  Vitals:   05/20/19 2118 05/21/19 0447  BP: (!) 146/97 130/83  Pulse: 80 73  Resp: 15 16  Temp: 99.3 F (37.4 C) 98.9 F (37.2 C)  SpO2: 95% 97%    Physical Exam: Cardiac: Rate and rhythm are regular Lungs: Nonlabored Incisions: Bolstered behind left knee in place without edema or bleeding Extremities: Lower extremity edema, calf soft, 2+ DP pulse  CBC    Component Value Date/Time   WBC 8.9 05/21/2019 0237   RBC 4.48 05/21/2019 0237   HGB 15.5 05/21/2019 0237   HGB 17.2 (H) 04/04/2019 1426   HCT 45.1 05/21/2019 0237   PLT 131 (L) 05/21/2019 0237   PLT 145 (L) 04/04/2019 1426   MCV 100.7 (H) 05/21/2019 0237   MCH 34.6 (H) 05/21/2019 0237   MCHC 34.4 05/21/2019 0237   RDW 13.6 05/21/2019 0237   LYMPHSABS 1.7 05/18/2019 1919   MONOABS 1.0 05/18/2019 1919   EOSABS 0.9 (H) 05/18/2019 1919   BASOSABS 0.1 05/18/2019 1919    BMET    Component Value Date/Time   NA 138 05/21/2019 0237   K 3.7 05/21/2019 0237   CL 104 05/21/2019 0237   CO2 25 05/21/2019 0237   GLUCOSE 97 05/21/2019 0237   BUN 6 (L) 05/21/2019 0237   CREATININE 1.06 05/21/2019 0237   CREATININE 1.09 04/12/2019 1032   CALCIUM 8.3 (L) 05/21/2019 0237   GFRNONAA >60 05/21/2019 0237   GFRNONAA >60 04/12/2019 1032   GFRAA >60 05/21/2019 0237   GFRAA >60 04/12/2019 1032     Intake/Output Summary (Last 24 hours) at 05/21/2019 0722 Last data filed at 05/21/2019 0329 Gross per 24 hour  Intake 1876.82 ml  Output --  Net 1876.82 ml    HOSPITAL MEDICATIONS Scheduled Meds: . atorvastatin  40 mg Oral Daily  . escitalopram  20 mg Oral Daily  . famotidine  20 mg Oral Daily  . folic acid  1 mg Oral Daily  . gabapentin  100 mg Oral QHS  . lisinopril  5 mg Oral Daily  . multivitamin with minerals  1 tablet Oral Daily  .  sodium chloride flush  3 mL Intravenous Q12H  . sodium chloride flush  3 mL Intravenous Q12H   Continuous Infusions: . sodium chloride 100 mL/hr at 05/20/19 1054  . sodium chloride    . heparin 1,900 Units/hr (05/20/19 1500)   PRN Meds:.sodium chloride, acetaminophen, hydrALAZINE, labetalol, ondansetron (ZOFRAN) IV, sodium chloride flush  Assessment:  59 y.o. male is s/p:  Status post left lower extremity venography, central venography with mechanical thrombectomy of left lower extremity, balloon angioplasty of left femoral vein left common femoral vein left common and external iliac veins.  He is doing well ambulating without difficulty.  No pain. 1 Day Post-Op  Plan: -Continue heparin.  Discuss timing of transitioning to oral anticoagulation and removal of bolster with Dr. Donzetta Matters. -DVT prophylaxis:  Heparin infusion   Risa Grill, PA-C [facility] [phone] 05/21/2019  7:22 AM   I have seen and evaluated the patient. I agree with the PA note as documented above. POD#1 s/p LLE percutaneous venous thrombectomy.  Leg looks much better - swelling continues to improve.  Transition to PO anticoagulation per primary team.  Will remove bolster today.  We will arrange follow-up in one month with left iliac  vein duplex, etc.  Marty Heck, MD [facility] [place] Office: [phone]

## 2019-05-21 NOTE — TOC Transition Note (Signed)
Transition of Care [facility]) - CM/SW Discharge Note Marvetta Gibbons RN, BSN Transitions of Care Unit 4E- RN Case Manager [phone]   Patient Details  Name: Daniel Bryant MRN: [id number] Date of Birth: 07-21-1960  Transition of Care [facility]) CM/SW Contact:  [name], RN Phone Number: 05/21/2019, 12:08 PM   Clinical Narrative:    Pt stable for transition home, will be placed on Lovenox for DVT. Pt has both VA and Medicaid benefits. Script has been sent to [place] for lovenox needs- meds to be filled and delivered to bedside prior to discharge. Pt goes to [facility]- PCP- Jeanene Erb - fax # [phone]- CSW Kellie [phone]. Call made to [facility] to see if she could tell CM who pt sees for oncology needs- per Nira Conn it is noted in [place] system that pt is seen by Dr. Julien Nordmann with [facility]. Call made to [place] center for f/u - center has pt down for upcoming appointment already scheduled for Mar. 18 at 2:30 pm- appointment placed on AVS for pt. Cm will fax d/c summary to PCP at the [facility]   Final next level of care: Home/Self Care Barriers to Discharge: No Barriers Identified   Patient Goals and CMS Choice     Choice offered to / list presented to : NA  Discharge Placement             Home          Discharge Plan and Services                DME Arranged: N/A DME Agency: NA       HH Arranged: NA HH Agency: NA        Social Determinants of Health (SDOH) Interventions     Readmission Risk Interventions Readmission Risk Prevention Plan 05/21/2019  Transportation Screening Complete  PCP or Specialist Appt within 5-7 Days Complete  Home Care Screening Complete  Medication Review (RN CM) Complete

## 2019-05-21 NOTE — Progress Notes (Signed)
MOBILITY TEAM - Progress Note   05/21/19 1000  Mobility  Activity Ambulated in hall  Level of Assistance Independent  Distance Ambulated (ft) 960 ft  Mobility Response Tolerated well   During activity: HR 108  BLE ted hose on. Patient reports no additional hallway ambulation yesterday after our walk in the afternoon. Encouraged more frequent mobility today.   Mabeline Caras, PT, DPT Mobility Team Pager [phone]

## 2019-05-21 NOTE — Progress Notes (Signed)
Daniel Bryant for heparin Indication: DVT  Allergies  Allergen Reactions  . Other Other (See Comments)    Patient states "I had a scrotal procedure a couple of years ago, there was something they gave me to relax me and I started freaking out and seeing things".  Unable to ascertain from pt or his records what medication was.    Patient Measurements: Height: 6' (182.9 cm) Weight: 220 lb 14.4 oz (100.2 kg) IBW/kg (Calculated) : 77.6 Heparin Dosing Weight: 98kg  Vital Signs: Temp: 98.9 F (37.2 C) (03/09 0447) Temp Source: Oral (03/09 0447) BP: 130/83 (03/09 0447) Pulse Rate: 73 (03/09 0447)  Labs: Recent Labs    05/18/19 1919 05/18/19 1919 05/19/19 0336 05/19/19 1013 05/20/19 0331 05/20/19 1817 05/21/19 0237  HGB 16.5   < > 15.1  --  15.3  --  15.5  HCT 48.0   < > 44.2  --  43.2  --  45.1  PLT 140*   < > 120*  --  126*  --  131*  HEPARINUNFRC  --   --  0.17*   < > 0.31 0.56 0.40  CREATININE 1.11  --  1.13  --   --   --  1.06   < > = values in this interval not displayed.    Estimated Creatinine Clearance: 89.6 mL/min (by C-G formula based on SCr of 1.06 mg/dL).  Assessment: 37 yom presented on 05/18/2019 with leg pain and swelling. 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 Pharmacy consulted to dose heparin. Of note, patient has stage IV non small cell lung cancer and is not on anticoagulation prior to admission.   Heparin remains therapeutic this morning and CBC stable s/p mechanical thrombectomy in OR 3/8.   Goal of Therapy:  Heparin level 0.3-0.7 units/ml Monitor platelets by anticoagulation protocol: Yes   Plan:  -Continue heparin 1900 units/h -Daily heparin level and CBC -Need to follow-up longterm [facility] plan   Arrie Senate, PharmD, BCPS Clinical Pharmacist [phone] Please check AMION for all [place] numbers 05/21/2019

## 2019-05-21 NOTE — Progress Notes (Signed)
Patient with discharge order, patient needs lovenox and medications from [facility] prior to discharge. Meshelle Holness, Bettina Gavia RN

## 2019-05-21 NOTE — Progress Notes (Signed)
Bolster and suture from left popliteal space removed and covered with foam border dressing. Leave in place and keep dry.  May shower tomorrow.  Message sent to office to arrange follow-up with [facility].

## 2019-05-21 NOTE — Progress Notes (Signed)
Patient friend/contact Kennyth Lose who will be his transportation will be here between 2:30-3pm,   Patient given discharge instructions medication list With next dose due and follow up appointments, Patient verbalized understanding at this time. Patient with prescriptions for home in hand at bedside. All questions answered at this time. IV and tele were dcd Will discharge home as ordered transported to exit via wheel chair and nursing staff. Zabria Liss, Bettina Gavia RN

## 2019-05-21 NOTE — Progress Notes (Signed)
Reviewed lovenox administration with patient. At this time patient agreeable to administration of lovenox to self at home. Reviewed administration instructions and patient verbalized understanding at this time. [facility] in to deliver medications for home use to patient. Nohelia Valenza, Bettina Gavia RN

## 2019-05-21 NOTE — Plan of Care (Signed)
  Problem: Health Behavior/Discharge Planning: Goal: Ability to manage health-related needs will improve Outcome: Progressing   Problem: Activity: Goal: Risk for activity intolerance will decrease Outcome: Progressing   

## 2019-05-21 NOTE — Progress Notes (Signed)
Subjective: HD#2 Events Overnight: No events overnight  Patient was seen this morning on rounds.  Patient was walking the halls with physical therapy.  He denies any pain states that he is feeling well.  Objective:  Vital signs in last 24 hours: Vitals:   05/20/19 1245 05/20/19 1314 05/20/19 2118 05/21/19 0447  BP:  140/87 (!) 146/97 130/83  Pulse: 71 81 80 73  Resp: 14 18 15 16   Temp:  98.4 F (36.9 C) 99.3 F (37.4 C) 98.9 F (37.2 C)  TempSrc:  Oral Oral Oral  SpO2: 95% 98% 95% 97%  Weight:  100.2 kg    Height:  6' (1.829 m)     Supplemental O2: None  Physical Exam: Physical Exam  Constitutional: He is oriented to person, place, and time and well-developed, well-nourished, and in no distress.  HENT:  Head: Normocephalic and atraumatic.  Eyes: EOM are normal.  Cardiovascular: Normal rate and intact distal pulses.  Pulmonary/Chest: Breath sounds normal.  Abdominal: Soft. He exhibits no distension. There is no abdominal tenderness.  Musculoskeletal:        General: No tenderness or edema. Normal range of motion.     Cervical back: Normal range of motion.  Neurological: He is alert and oriented to person, place, and time. Gait normal.  Skin: Skin is warm and dry.    Filed Weights   05/18/19 1900 05/20/19 1314  Weight: 100.3 kg 100.2 kg     Intake/Output Summary (Last 24 hours) at 05/21/2019 0109 Last data filed at 05/21/2019 0329 Gross per 24 hour  Intake 1876.82 ml  Output -  Net 1876.82 ml    Risk Score:  N/A  Pertinent labs/Imaging: CBC Latest Ref Rng & Units 05/21/2019 05/20/2019 05/19/2019  WBC 4.0 - 10.5 K/uL 8.9 8.5 9.4  Hemoglobin 13.0 - 17.0 g/dL 15.5 15.3 15.1  Hematocrit 39.0 - 52.0 % 45.1 43.2 44.2  Platelets 150 - 400 K/uL 131(L) 126(L) 120(L)    CMP Latest Ref Rng & Units 05/21/2019 05/19/2019 05/18/2019  Glucose 70 - 99 mg/dL 97 114(H) 101(H)  BUN 8 - 23 mg/dL 6(L) 9 11  Creatinine 0.61 - 1.24 mg/dL 1.06 1.13 1.11  Sodium 135 - 145 mmol/L 138 139  140  Potassium 3.5 - 5.1 mmol/L 3.7 3.6 3.2(L)  Chloride 98 - 111 mmol/L 104 99 99  CO2 22 - 32 mmol/L 25 33(H) 28  Calcium 8.9 - 10.3 mg/dL 8.3(L) 8.3(L) 8.7(L)  Total Protein 6.5 - 8.1 g/dL - - -  Total Bilirubin 0.3 - 1.2 mg/dL - - -  Alkaline Phos 38 - 126 U/L - - -  AST 15 - 41 U/L - - -  ALT 0 - 44 U/L - - -    Assessment/Plan:  Active Problems:   DVT (deep venous thrombosis) ([facility])   Dvt femoral (deep venous thrombosis) ([facility])   Patient Summary: Daniel Bryant is a 59 y.o. with pertinent PMH of  sick sinus syndrome s/p pacemaker, COPD, GERD, hyperlipidemia, HTN, OSA, stage IV non-small cell lung cancer (DX 03/2018) who presented with left lower leg strongly swelling and pain and admit for deep vein thrombosis on hospital day 2  #Left lower extremity DVT  Patient is doing well postop mechanical thrombectomy with pain well controlled and tolerating heparin therapy. He is able to walk without assistance. We will transition him to Lovenox 1.5 mg/kg (150 mg daily) for 3 to 6 months. -Discharge today on therapeutic Lovenox  #Stage IV metastatic non-small cell lung  cancer -Outpatient follow-up with patient's oncologist. -Consulted social work to help set up a follow-up appointment with oncology  #Short-term memory loss -Outpatient follow-up with neuropsych.  Diet: Normal IVF: None,None VTE: Enoxaparin Code: Full PT/OT recs: None TOC recs: None   Dispo: Anticipated discharge today pending vascular recommendations.    Marianna Payment, D.O. [facility], PGY-1 Date 05/21/2019 Time 6:09 AM

## 2019-05-23 ENCOUNTER — Other Ambulatory Visit: Payer: No Typology Code available for payment source

## 2019-05-26 NOTE — Discharge Summary (Signed)
Name: Daniel Bryant MRN: [id number] DOB: 04-18-1960 59 y.o. PCP: [facility]  Date of Admission: 05/18/2019  3:30 PM Date of Discharge: 05/21/2019 Attending Physician: No att. providers found  Discharge Diagnosis: 1.  Deep vein thrombosis 2/2 to active malignancy   Discharge Medications: Allergies as of 05/21/2019      Reactions   Other Other (See Comments)   Patient states "I had a scrotal procedure a couple of years ago, there was something they gave me to relax me and I started freaking out and seeing things".  Unable to ascertain from pt or his records what medication was.      Medication List    TAKE these medications   albuterol (2.5 MG/3ML) 0.083% nebulizer solution Commonly known as: PROVENTIL Take 3 mLs (2.5 mg total) by nebulization 2 (two) times daily as needed for wheezing or shortness of breath.   atorvastatin 40 MG tablet Commonly known as: LIPITOR Take 40 mg by mouth daily.   busPIRone 10 MG tablet Commonly known as: BUSPAR Take 5 mg by mouth 2 (two) times daily.   enoxaparin 100 MG/ML injection Commonly known as: LOVENOX Inject 1.5 mLs (150 mg total) into the skin daily.   escitalopram 20 MG tablet Commonly known as: LEXAPRO Take 20 mg by mouth daily.   famotidine 20 MG tablet Commonly known as: PEPCID Take 1 tablet (20 mg total) by mouth daily.   feeding supplement (PRO-STAT SUGAR FREE 64) Liqd Take 30 mLs by mouth 2 (two) times daily.   folic acid 1 MG tablet Commonly known as: FOLVITE Take 1 tablet (1 mg total) by mouth daily.   gabapentin 100 MG capsule Commonly known as: NEURONTIN Take 100 mg by mouth at bedtime.   lisinopril 5 MG tablet Commonly known as: ZESTRIL Take 5 mg by mouth daily.   multivitamin with minerals Tabs tablet Take 1 tablet by mouth daily.   Potassium Chloride ER 20 MEQ Tbcr Take 20 mEq by mouth daily.   prochlorperazine 10 MG tablet Commonly known as: COMPAZINE Take 1 tablet (10 mg total) by  mouth every 6 (six) hours as needed for nausea or vomiting.       Disposition and follow-up:   [name] from [facility] in Stable condition.  At the hospital follow up visit please address:  1.  Follow-up: 1) DVT-make sure patient is on anticoagulation as needed in the setting of active malignancy 2) lung cancer-patient was set up with a follow-up appointment with his oncologist prior to being discharged  2.  Labs / imaging needed at time of follow-up: None  3.  Pending labs/ test needing follow-up: None  Follow-up Appointments: [facility].   Contact information: [address] [address] [phone]        Curt Bears, MD. Go on 05/30/2019.   Specialty: Oncology Why: has appointment for 2:30 pm  Contact information: [place] [address] [phone]        [facility] up.   Why: Dr. Pixie Casino information: [place] [address] [phone]        Marty Heck, MD Follow up in 1 month(s).   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   1.  DVT -patient presented [facility] ED on [phone] with left lower extremity pain and swelling.  Left lower extremity duplex ultrasound was significant for extensive DVT in the left common iliac, external iliac, common femoral vein and the remaining of the left lower extremity.  Patient was started on heparin GTT.   Vascular surgery was consulted and thrombectomy was performed successfully.  Patient was transitioned to Lovenox 1.5 mg/kg for 3 to 6 months and discharged.  2.  Stage IV metastatic non-small cell lung cancer -patient states that he has a history of lung cancer that he believed was fully treated and cured.  CT scan of abdomen pelvis shows metastasis of the cancer.  Patient has not seen oncologist in some time and has been noted to miss his last appointment secondary to short-term memory impairment.  We set patient up with a follow-up appointment with his oncologist prior to being discharged.  3.  Short-term memory impairment -patient seems to have problems with short-term memory.  He is missed several follow-up appointments with his oncologist as result of his memory difficulties.  Mini-Mental exam was within normal limits during this hospitalization.  Patient would benefit from a neuropsychiatric evaluation at discharge.  Patient was given information to follow-up with neuropsychiatric physician at discharge.  CT abdomen and pelvis:  1. Enlargement of the left common femoral and left external iliac veins with apparent hypodense content concerning for extension of known DVT into the left external or left common iliac vein. Evaluation of the venous system however is limited due to suboptimal opacification and timing of the contrast. 2. Slightly rounded retroperitoneal lymph nodes, increased in size compared to the prior CT. 3. No bowel obstruction. Normal appendix. 4. Mild fatty infiltration of the liver with findings of possible early cirrhosis or pseudocirrhosis. 5. 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 6. A 5 x 8 cm mass/metastatic implant at the right cardiophrenic angle. 7. Aortic Atherosclerosis (ICD10-I70.0).  Discharge Vitals:   BP (!) 147/94 (BP Location: Left Arm)   Pulse 86   Temp 98.9 F (37.2 C) (Oral)   Resp 17   Ht 6' (1.829  m)   Wt 100.2 kg   SpO2 98%   BMI 29.96 kg/m   Pertinent Labs, Studies, and Procedures:  CBC Latest Ref Rng & Units 05/21/2019 05/20/2019 05/19/2019  WBC 4.0 - 10.5 K/uL 8.9 8.5 9.4  Hemoglobin 13.0 - 17.0 g/dL 15.5 15.3 15.1  Hematocrit 39.0 - 52.0 % 45.1 43.2 44.2  Platelets 150 - 400 K/uL 131(L) 126(L) 120(L)   CMP Latest Ref Rng & Units 05/21/2019 05/19/2019 05/18/2019  Glucose 70 - 99 mg/dL 97 114(H) 101(H)  BUN 8 - 23 mg/dL 6(L) 9 11  Creatinine 0.61 - 1.24 mg/dL 1.06 1.13 1.11  Sodium 135 - 145 mmol/L 138 139 140  Potassium 3.5 - 5.1 mmol/L 3.7 3.6 3.2(L)  Chloride 98 - 111 mmol/L 104 99 99  CO2 22 - 32 mmol/L 25 33(H) 28  Calcium 8.9 - 10.3 mg/dL 8.3(L) 8.3(L) 8.7(L)  Total Protein 6.5 - 8.1 g/dL - - -  Total Bilirubin 0.3 - 1.2 mg/dL - - -  Alkaline Phos 38 - 126 U/L - - -  AST 15 -  41 U/L - - -  ALT 0 - 44 U/L - - -     Discharge Instructions: Discharge Instructions    Diet - low sodium heart healthy   Complete by: As directed    Discharge instructions   Complete by: As directed    You were hospitalized for Deep vein thrombosis. Thank you for allowing [place] to be part of your care.   We arranged for you to follow up at:   1) Follow up with your PCP within a week 2) Please follow up with your oncologist within a week. Social work should be setting you up with an appointment to follow up.  3. Follow up with your vascular surgeon for management of your Lovenox (anticogulation)   Please note these changes made to your medications:   Please START taking:  1. Lovenox 1.5 mg/kg daily  Please STOP taking:    Please make sure to take your anticoagulation medication - Lovenox daily  Please call our clinic if you have any questions or concerns, we may be able to help and keep you from a long and expensive emergency room wait. Our clinic and after hours phone number is [phone], the best time to call is Monday through Friday 9 am to 4 pm but there is always someone available  24/7 if you have an emergency. If you need medication refills please notify your pharmacy one week in advance and they will send [place] a request.   Increase activity slowly   Complete by: As directed       Signed: Marianna Payment, MD 05/26/2019, 5:00 PM   Pager: [phone]

## 2019-05-30 ENCOUNTER — Other Ambulatory Visit: Payer: No Typology Code available for payment source

## 2019-05-30 ENCOUNTER — Telehealth: Payer: Self-pay

## 2019-05-30 ENCOUNTER — Ambulatory Visit: Payer: No Typology Code available for payment source

## 2019-05-30 ENCOUNTER — Ambulatory Visit: Payer: No Typology Code available for payment source | Admitting: Internal Medicine

## 2019-05-30 ENCOUNTER — Inpatient Hospital Stay: Payer: No Typology Code available for payment source

## 2019-05-30 ENCOUNTER — Other Ambulatory Visit: Payer: Self-pay

## 2019-05-30 ENCOUNTER — Other Ambulatory Visit: Payer: Self-pay | Admitting: Physician Assistant

## 2019-05-30 DIAGNOSIS — Z5111 Encounter for antineoplastic chemotherapy: Secondary | ICD-10-CM | POA: Diagnosis present

## 2019-05-30 DIAGNOSIS — C3491 Malignant neoplasm of unspecified part of right bronchus or lung: Secondary | ICD-10-CM

## 2019-05-30 DIAGNOSIS — C3411 Malignant neoplasm of upper lobe, right bronchus or lung: Secondary | ICD-10-CM | POA: Diagnosis present

## 2019-05-30 LAB — CBC WITH DIFFERENTIAL (CANCER CENTER ONLY)
Abs Immature Granulocytes: 0.03 10*3/uL (ref 0.00–0.07)
Basophils Absolute: 0.1 10*3/uL (ref 0.0–0.1)
Basophils Relative: 1 %
Eosinophils Absolute: 1 10*3/uL — ABNORMAL HIGH (ref 0.0–0.5)
Eosinophils Relative: 9 %
HCT: 50 % (ref 39.0–52.0)
Hemoglobin: 17.2 g/dL — ABNORMAL HIGH (ref 13.0–17.0)
Immature Granulocytes: 0 %
Lymphocytes Relative: 18 %
Lymphs Abs: 2.2 10*3/uL (ref 0.7–4.0)
MCH: 34.5 pg — ABNORMAL HIGH (ref 26.0–34.0)
MCHC: 34.4 g/dL (ref 30.0–36.0)
MCV: 100.2 fL — ABNORMAL HIGH (ref 80.0–100.0)
Monocytes Absolute: 0.9 10*3/uL (ref 0.1–1.0)
Monocytes Relative: 8 %
Neutro Abs: 7.6 10*3/uL (ref 1.7–7.7)
Neutrophils Relative %: 64 %
Platelet Count: 164 10*3/uL (ref 150–400)
RBC: 4.99 MIL/uL (ref 4.22–5.81)
RDW: 13 % (ref 11.5–15.5)
WBC Count: 11.9 10*3/uL — ABNORMAL HIGH (ref 4.0–10.5)
nRBC: 0 % (ref 0.0–0.2)

## 2019-05-30 LAB — MAGNESIUM: Magnesium: 1.2 mg/dL — CL (ref 1.7–2.4)

## 2019-05-30 LAB — CMP (CANCER CENTER ONLY)
ALT: 37 U/L (ref 0–44)
AST: 42 U/L — ABNORMAL HIGH (ref 15–41)
Albumin: 4 g/dL (ref 3.5–5.0)
Alkaline Phosphatase: 93 U/L (ref 38–126)
Anion gap: 12 (ref 5–15)
BUN: 7 mg/dL — ABNORMAL LOW (ref 8–23)
CO2: 31 mmol/L (ref 22–32)
Calcium: 10.7 mg/dL — ABNORMAL HIGH (ref 8.9–10.3)
Chloride: 97 mmol/L — ABNORMAL LOW (ref 98–111)
Creatinine: 1.08 mg/dL (ref 0.61–1.24)
GFR, Est AFR Am: >60 mL/min (ref >60)
GFR, Estimated: >60 mL/min (ref >60)
Glucose, Bld: 101 mg/dL — ABNORMAL HIGH (ref 70–99)
Potassium: 4 mmol/L (ref 3.5–5.1)
Sodium: 140 mmol/L (ref 135–145)
Total Bilirubin: 0.7 mg/dL (ref 0.3–1.2)
Total Protein: 8 g/dL (ref 6.5–8.1)

## 2019-05-30 MED ORDER — MAGNESIUM OXIDE 400 (241.3 MG) MG PO TABS: 400.0000 mg | ORAL_TABLET | Freq: Three times a day (TID) | ORAL | 0 refills | Status: AC

## 2019-05-30 NOTE — Telephone Encounter (Signed)
Critical Magnesium of 1.2 reported to [name] H PA.

## 2019-05-30 NOTE — Telephone Encounter (Signed)
Contacted patient and made aware of low Magnesium level and to take his po Mag TID. Patient verbalized understanding.

## 2019-06-06 ENCOUNTER — Inpatient Hospital Stay: Payer: No Typology Code available for payment source

## 2019-06-06 ENCOUNTER — Inpatient Hospital Stay (HOSPITAL_BASED_OUTPATIENT_CLINIC_OR_DEPARTMENT_OTHER): Payer: No Typology Code available for payment source | Admitting: Physician Assistant

## 2019-06-06 ENCOUNTER — Other Ambulatory Visit: Payer: Self-pay

## 2019-06-06 VITALS — BP 157/98 | HR 82

## 2019-06-06 VITALS — BP 177/105 | HR 93 | Temp 98.2°F | Resp 19 | Ht 72.0 in | Wt 227.1 lb

## 2019-06-06 DIAGNOSIS — C3491 Malignant neoplasm of unspecified part of right bronchus or lung: Secondary | ICD-10-CM

## 2019-06-06 DIAGNOSIS — Z5111 Encounter for antineoplastic chemotherapy: Secondary | ICD-10-CM

## 2019-06-06 DIAGNOSIS — I10 Essential (primary) hypertension: Secondary | ICD-10-CM | POA: Diagnosis not present

## 2019-06-06 DIAGNOSIS — Z5112 Encounter for antineoplastic immunotherapy: Secondary | ICD-10-CM | POA: Diagnosis not present

## 2019-06-06 LAB — CBC WITH DIFFERENTIAL (CANCER CENTER ONLY)
Abs Immature Granulocytes: 0.02 10*3/uL (ref 0.00–0.07)
Basophils Absolute: 0.1 10*3/uL (ref 0.0–0.1)
Basophils Relative: 1 %
Eosinophils Absolute: 1.3 10*3/uL — ABNORMAL HIGH (ref 0.0–0.5)
Eosinophils Relative: 12 %
HCT: 47.2 % (ref 39.0–52.0)
Hemoglobin: 16.4 g/dL (ref 13.0–17.0)
Immature Granulocytes: 0 %
Lymphocytes Relative: 24 %
Lymphs Abs: 2.4 10*3/uL (ref 0.7–4.0)
MCH: 33.5 pg (ref 26.0–34.0)
MCHC: 34.7 g/dL (ref 30.0–36.0)
MCV: 96.3 fL (ref 80.0–100.0)
Monocytes Absolute: 1 10*3/uL (ref 0.1–1.0)
Monocytes Relative: 10 %
Neutro Abs: 5.5 10*3/uL (ref 1.7–7.7)
Neutrophils Relative %: 53 %
Platelet Count: 139 10*3/uL — ABNORMAL LOW (ref 150–400)
RBC: 4.9 MIL/uL (ref 4.22–5.81)
RDW: 12.6 % (ref 11.5–15.5)
WBC Count: 10.4 10*3/uL (ref 4.0–10.5)
nRBC: 0 % (ref 0.0–0.2)

## 2019-06-06 LAB — CMP (CANCER CENTER ONLY)
ALT: 30 U/L (ref 0–44)
AST: 27 U/L (ref 15–41)
Albumin: 3.8 g/dL (ref 3.5–5.0)
Alkaline Phosphatase: 89 U/L (ref 38–126)
Anion gap: 12 (ref 5–15)
BUN: 5 mg/dL — ABNORMAL LOW (ref 8–23)
CO2: 31 mmol/L (ref 22–32)
Calcium: 10.3 mg/dL (ref 8.9–10.3)
Chloride: 100 mmol/L (ref 98–111)
Creatinine: 1.11 mg/dL (ref 0.61–1.24)
GFR, Est AFR Am: >60 mL/min (ref >60)
GFR, Estimated: >60 mL/min (ref >60)
Glucose, Bld: 91 mg/dL (ref 70–99)
Potassium: 3.8 mmol/L (ref 3.5–5.1)
Sodium: 143 mmol/L (ref 135–145)
Total Bilirubin: 0.6 mg/dL (ref 0.3–1.2)
Total Protein: 7.4 g/dL (ref 6.5–8.1)

## 2019-06-06 IMAGING — CR DG CHEST 2V
2 series · 2 of 2 positions shown · non-contrast
Comparison: 05/03/2018

CLINICAL DATA: Shortness of breath

EXAM:
CHEST - 2 VIEW

[w chest pa]
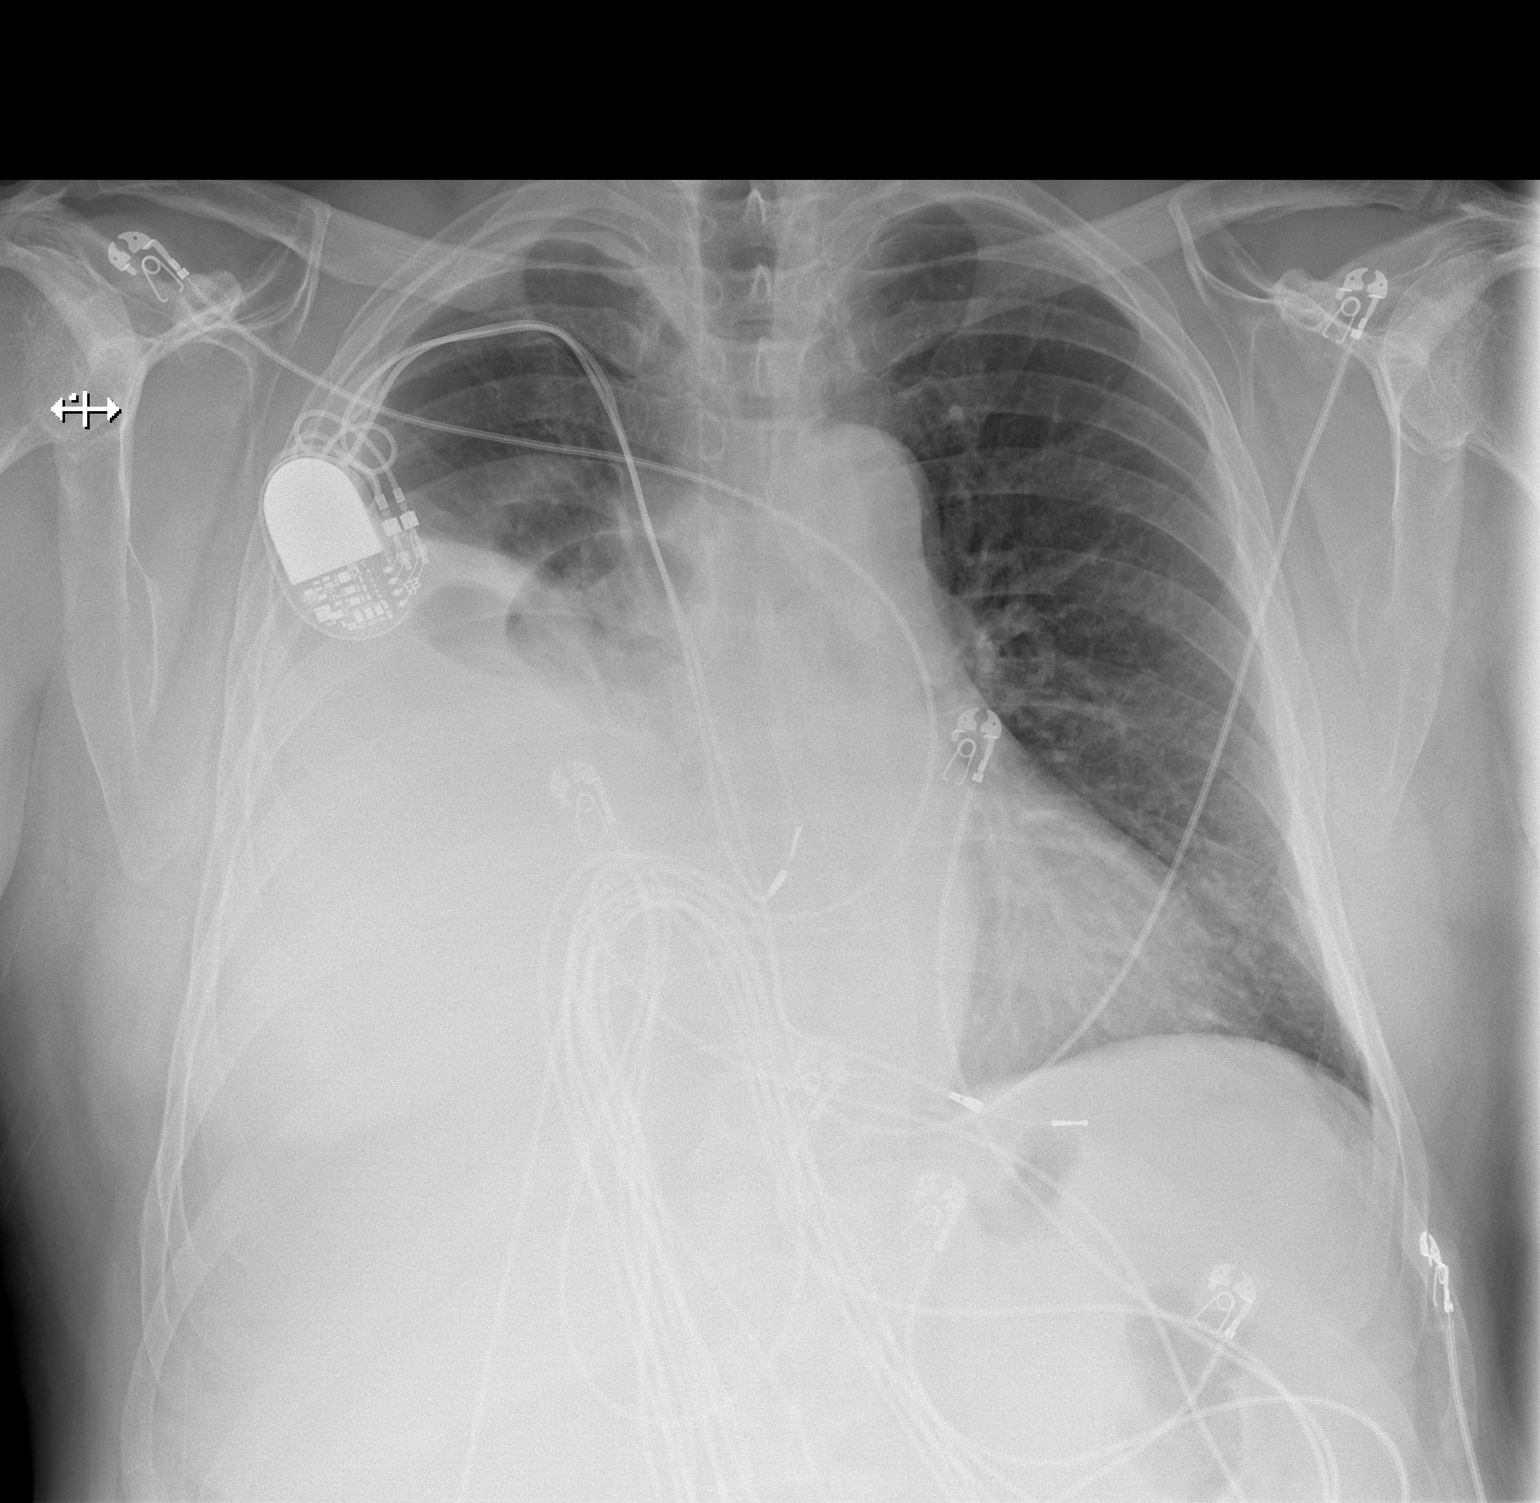

[w chest lat]
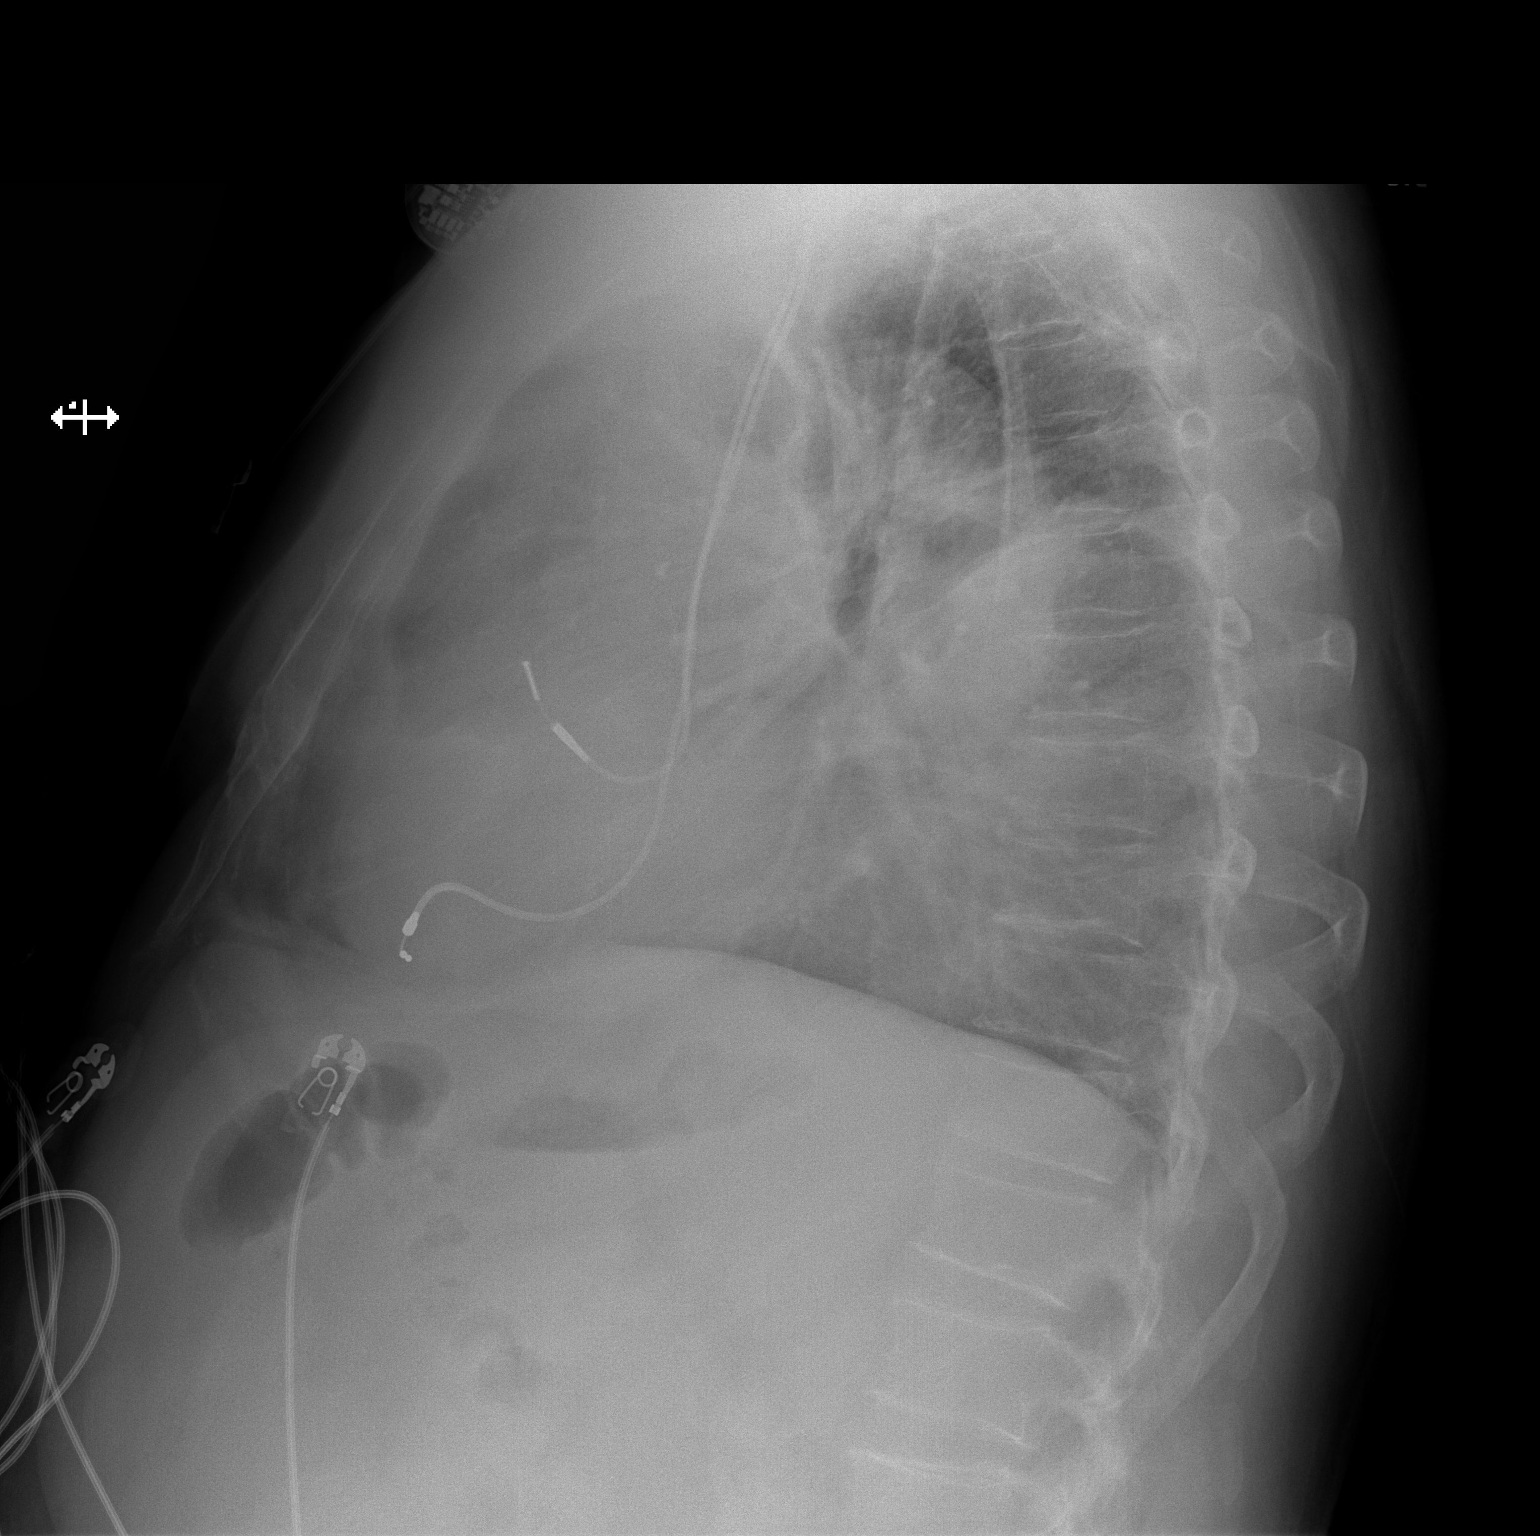

[2 of 2 positions shown; findings below may reference images not displayed]

FINDINGS: RIGHT subclavian transvenous pacemaker leads project at RIGHT atrium
and RIGHT ventricle unchanged.

Enlargement of cardiac silhouette with slight vascular congestion.

Mediastinal contours normal.

Large RIGHT pleural effusion with significant atelectasis of the
inferior [DATE] of the RIGHT lung.

Increase in RIGHT pleural effusion and atelectasis since prior exam.

LEFT lung clear.

No pneumothorax or acute osseous findings.
IMPRESSION: Large RIGHT pleural effusion and significant atelectasis of the mid
inferior RIGHT lung increased since 05/03/2018.

## 2019-06-06 IMAGING — DX DG CHEST 1V
1 series · 1 of 1 positions shown · non-contrast
Comparison: 05/10/2018

CLINICAL DATA: Status post right thoracentesis

EXAM:
CHEST  1 VIEW

[chest ap]
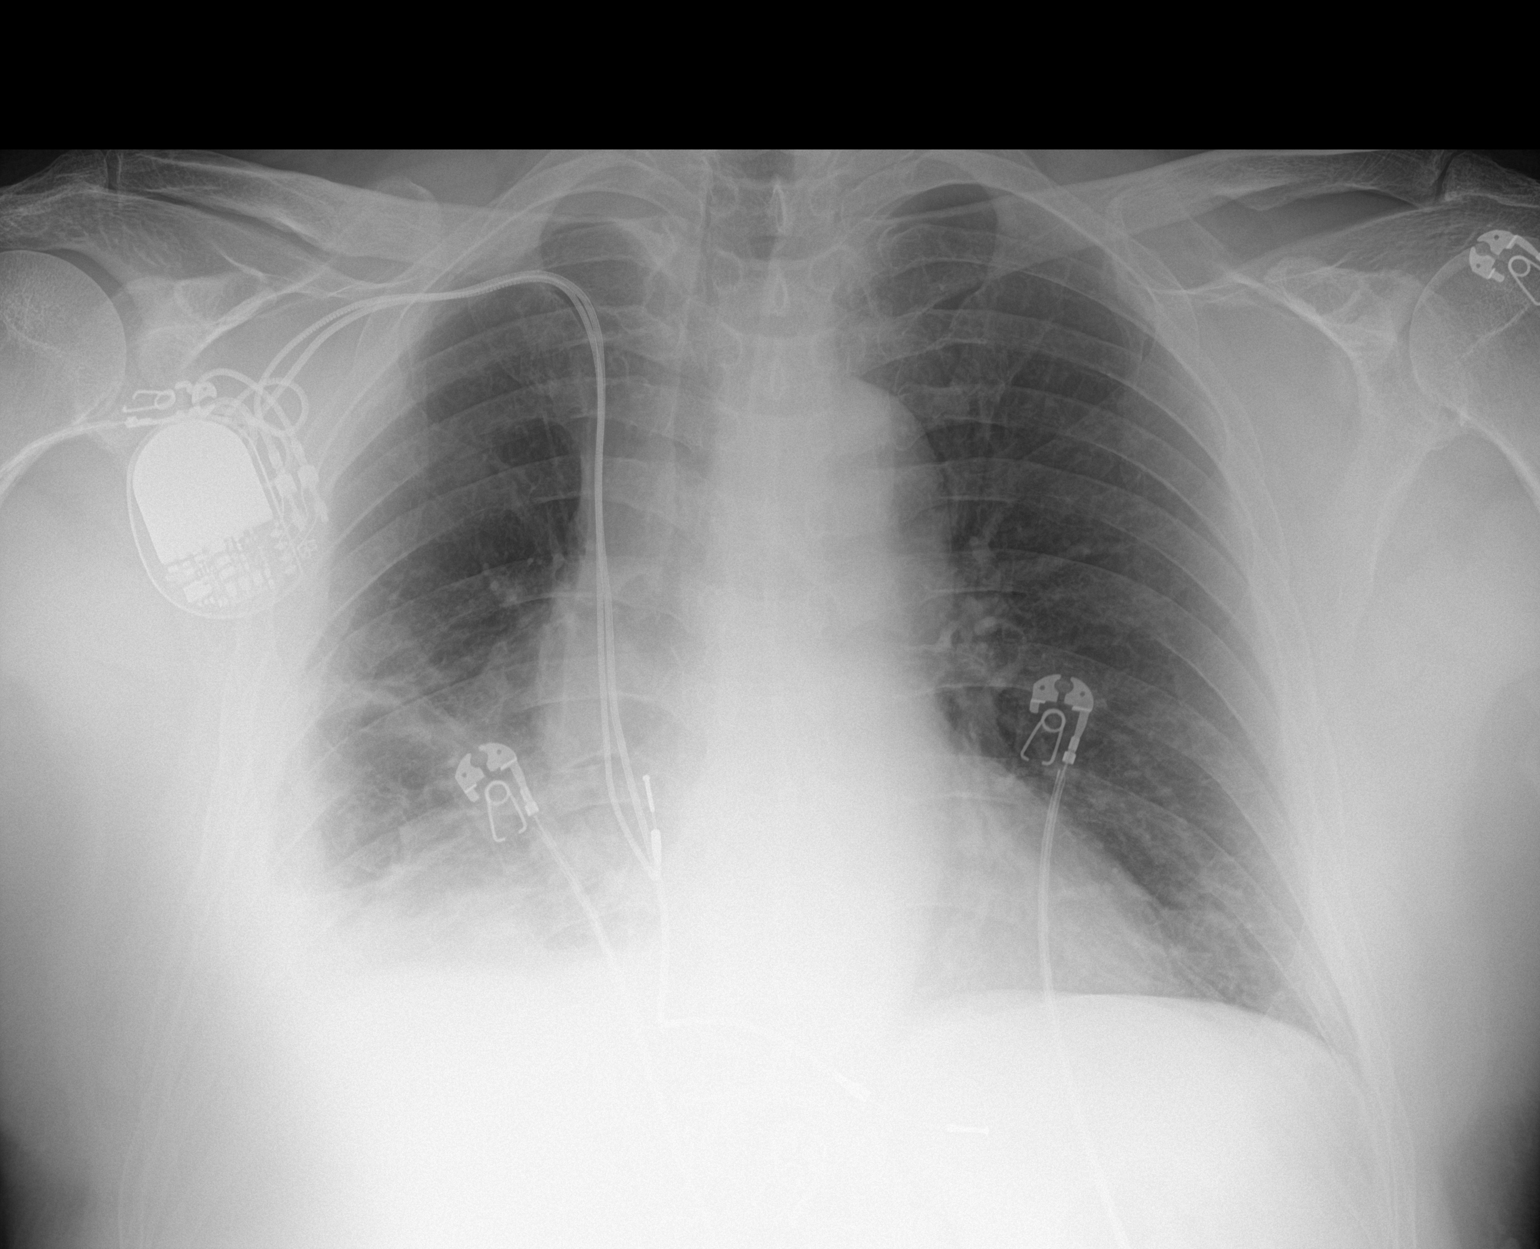

[1 of 1 positions shown; findings below may reference images not displayed]

FINDINGS: Cardiac shadow is within normal limits. Pacing device is again seen
and stable. Significant reduction in right-sided pleural effusion is
noted following thoracentesis. No pneumothorax is noted. Right
basilar atelectatic changes are seen.
IMPRESSION: No pneumothorax following right thoracentesis.

## 2019-06-06 IMAGING — US US THORACENTESIS ASP PLEURAL SPACE W/IMG GUIDE
1 series · 4 of 4 positions shown · non-contrast
Comparison: none

INDICATION: Patient history right lung adenocarcinoma, COPD, dyspnea, and
recurrent malignant right pleural effusion. Request is made for
therapeutic right thoracentesis.

[Series 1: us thoracentesis asp pleural space w/img guide · 4 of 4 slices shown]
[im 1/4]
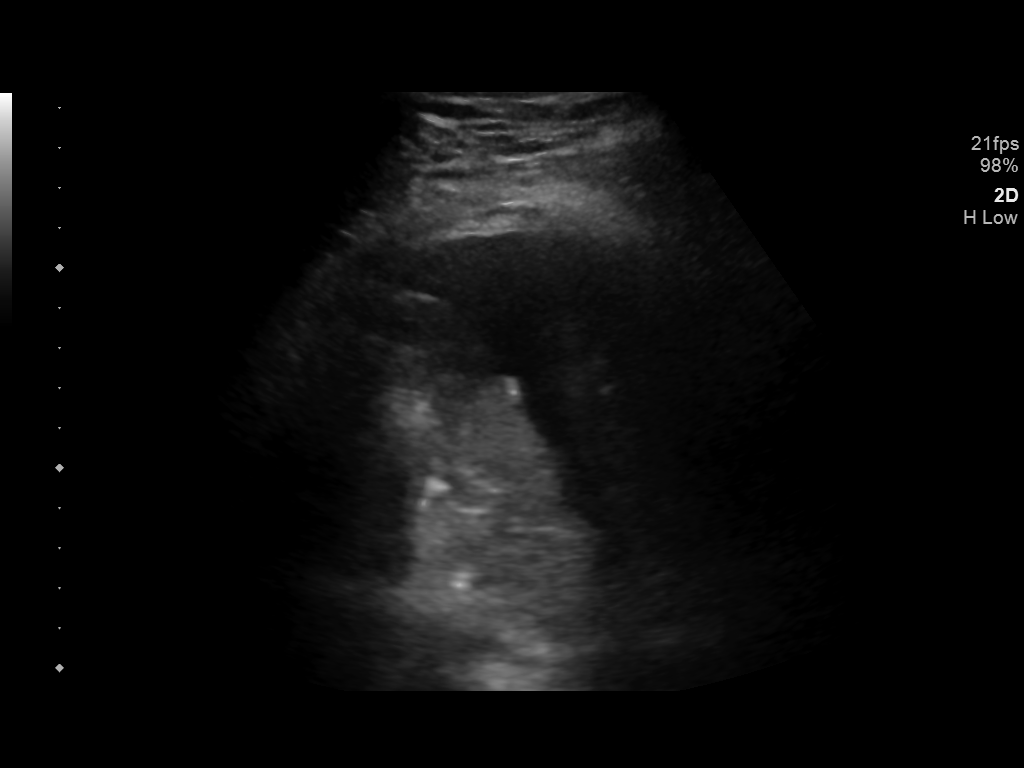
[im 2/4]
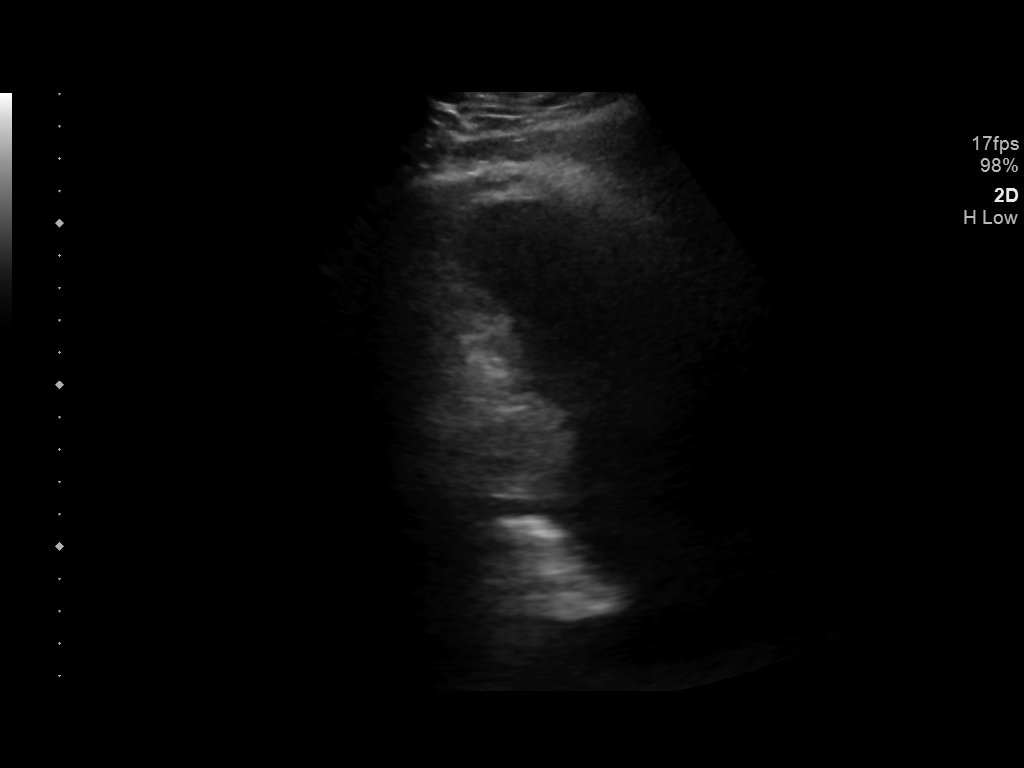
[im 3/4]
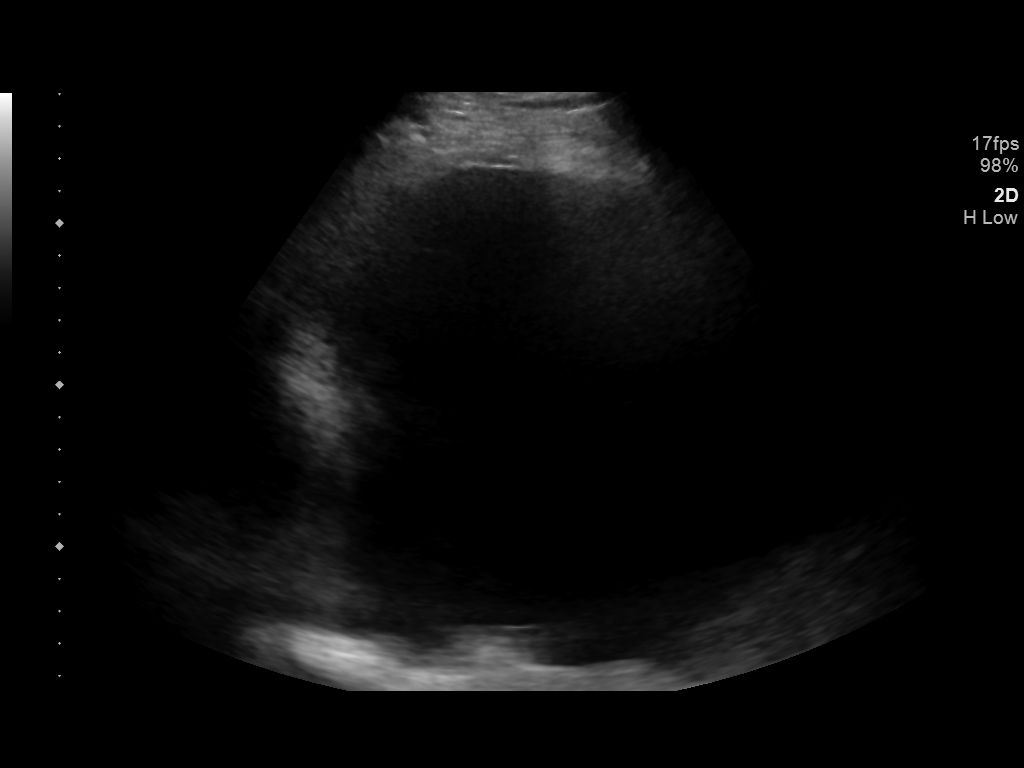
[im 4/4]
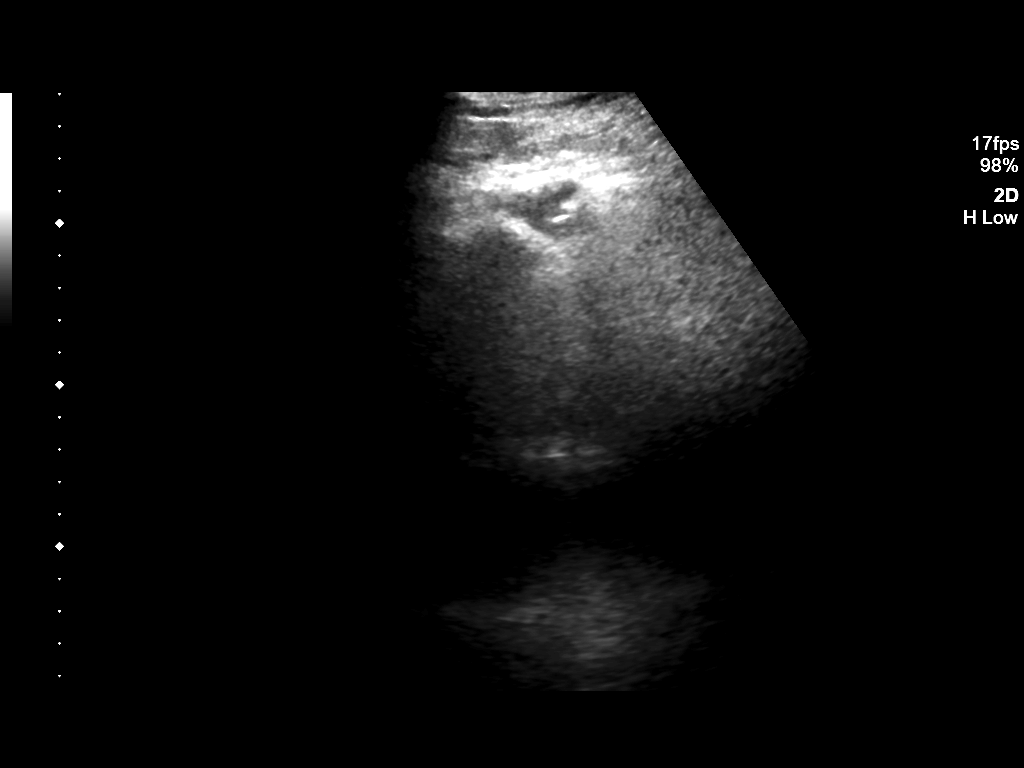

[4 of 4 positions shown; findings below may reference images not displayed]

EXAM:
ULTRASOUND GUIDED THERAPEUTIC RIGHT THORACENTESIS

MEDICATIONS:
10 mL of% lidocaine

COMPLICATIONS:
None immediate.

PROCEDURE:
An ultrasound guided thoracentesis was thoroughly discussed with the
patient and questions answered. The benefits, risks, alternatives
and complications were also discussed. The patient understands and
wishes to proceed with the procedure. Written consent was obtained.

Ultrasound was performed to localize and mark an adequate pocket of
fluid in the right chest. The area was then prepped and draped in
the normal sterile fashion. 1% Lidocaine was used for local
anesthesia. Under ultrasound guidance a 6 Fr Safe-T-Centesis
catheter was introduced. Thoracentesis was performed. The catheter
was removed and a dressing applied.
FINDINGS: A total of approximately 3.5 L of hazy gold fluid was removed.
IMPRESSION: Successful ultrasound guided right thoracentesis yielding 3.5 L of
pleural fluid.

## 2019-06-06 MED ORDER — CLONIDINE HCL 0.1 MG PO TABS
ORAL_TABLET | ORAL | Status: AC
  Filled 2019-06-06: qty 2

## 2019-06-06 MED ORDER — SODIUM CHLORIDE 0.9 % IV SOLN
530.8000 mg | Freq: Once | INTRAVENOUS | Status: AC
  Administered 2019-06-06: 530 mg via INTRAVENOUS
  Filled 2019-06-06: qty 53

## 2019-06-06 MED ORDER — CLONIDINE HCL 0.1 MG PO TABS
0.2000 mg | ORAL_TABLET | Freq: Once | ORAL | Status: AC
  Administered 2019-06-06: 12:00:00 0.2 mg via ORAL

## 2019-06-06 MED ORDER — SODIUM CHLORIDE 0.9 % IV SOLN
380.0000 mg/m2 | Freq: Once | INTRAVENOUS | Status: AC
  Administered 2019-06-06: 900 mg via INTRAVENOUS
  Filled 2019-06-06: qty 20

## 2019-06-06 MED ORDER — SODIUM CHLORIDE 0.9 % IV SOLN
Freq: Once | INTRAVENOUS | Status: AC
  Filled 2019-06-06: qty 250

## 2019-06-06 MED ORDER — CYANOCOBALAMIN 1000 MCG/ML IJ SOLN
INTRAMUSCULAR | Status: AC
  Filled 2019-06-06: qty 1

## 2019-06-06 MED ORDER — PALONOSETRON HCL INJECTION 0.25 MG/5ML
0.2500 mg | Freq: Once | INTRAVENOUS | Status: AC
  Administered 2019-06-06: 0.25 mg via INTRAVENOUS

## 2019-06-06 MED ORDER — CYANOCOBALAMIN 1000 MCG/ML IJ SOLN
1000.0000 ug | Freq: Once | INTRAMUSCULAR | Status: AC
  Administered 2019-06-06: 1000 ug via INTRAMUSCULAR

## 2019-06-06 MED ORDER — PALONOSETRON HCL INJECTION 0.25 MG/5ML
INTRAVENOUS | Status: AC
  Filled 2019-06-06: qty 5

## 2019-06-06 MED ORDER — SODIUM CHLORIDE 0.9 % IV SOLN
Freq: Once | INTRAVENOUS | Status: AC
  Filled 2019-06-06: qty 5

## 2019-06-06 NOTE — Progress Notes (Signed)
[place] [address]  DIAGNOSIS: Stage IV (T1b, N2, M1) non-small cell lung cancer, adenocarcinoma diagnosed in January 2020. He presented with right upper lobe lung nodule in addition to mediastinal lymphadenopathy and malignant right pleural effusion.  PRIOR THERAPY: Systemic chemotherapy with carboplatin for AUC of 5, Alimta 500 mg/M2 and Keytruda 200 mg IV every 3 weeks status post4cycles.Last dose was given on 10/16/2018.[name] was discontinued starting from cycle #2 because of significant skin rash as well as diarrhea. His dose of carboplatin was also reduced to AUC of 5 and Alimta to 400 mg/M2 starting from cycle #3 secondary to intolerance.  CURRENT THERAPY: Resuming treatment with Carboplatin for an AUC of 4 and Alimta 400 mg/m IV every 3 weeks.  Starting 06/06/2019.  INTERVAL HISTORY: Daniel Bryant 59 y.o. male returns to clinic today for follow-up visit.  The patient was last seen on 04/12/2019.  At that time the patient's recent CT scan showed evidence of disease progression.  The plan was to resume the patient's chemotherapy with carboplatin and Alimta.  Unfortunately, due to the patient's short-term memory problems, he missed several appointments and never resumed his treatment.  The patient has short-term memory loss and lives in a tiny home community for veterans.  Multiple attempts were made to call the patient and arrange transportation.  Also reached out to case manager at the [place].  The patient's voicemail has been full and we are unable to leave voicemails.  The patient recently was admitted to the hospital for a DVT in the left lower extremity on March seventh, 2021.  Since being discharged from the hospital, the patient has a home health nurse that comes by daily to help administer his Lovenox.  She has been helping him coordinate his appointments and he finally made  to the clinic today.  Today, the patient is feeling fairly well.  He denies any recent fever, chills, night sweats, or weight loss.  He denies any chest pain, shortness of breath, cough, or hemoptysis.  He denies any vomiting, diarrhea, or constipation.  The patient had some nausea this morning which he attributes to eating lentil soup last night that had a "mold" on it.  He denies any headache or visual changes.  He denies any extremity weakness, slurred speech, or facial droop.  His blood pressure is high in the clinic today.  The patient states that he did not take his blood pressure medications prior to coming to his appointment.  He is here today for evaluation before resuming his treatment with cycle #1 of carboplatin and Alimta.      MEDICAL HISTORY: Past Medical History:  Diagnosis Date  . Asthma   . Cancer ([facility])   . COPD (chronic obstructive pulmonary disease) ([facility])   . GERD (gastroesophageal reflux disease)   . High cholesterol   . History of petit-mal seizures   . Hyperlipidemia   . Hypertension   . Memory impairment   . OSA (obstructive sleep apnea)    Does not tolerate CPAP  . Pacemaker   . PTSD (post-traumatic stress disorder)   . Sick sinus syndrome ([facility])     ALLERGIES:  is allergic to other.  MEDICATIONS:  Current Outpatient Medications  Medication Sig Dispense Refill  . albuterol (PROVENTIL) (2.5 MG/3ML) 0.083% nebulizer solution Take 3 mLs (2.5 mg total) by nebulization 2 (two) times daily as needed for wheezing or shortness of breath. 75 mL 0  .  Amino Acids-Protein Hydrolys (FEEDING SUPPLEMENT, PRO-STAT SUGAR FREE 64,) LIQD Take 30 mLs by mouth 2 (two) times daily. 887 mL 0  . atorvastatin (LIPITOR) 40 MG tablet Take 40 mg by mouth daily.     . busPIRone (BUSPAR) 10 MG tablet Take 5 mg by mouth 2 (two) times daily.    Marland Kitchen enoxaparin (LOVENOX) 100 MG/ML injection Inject 1.5 mLs (150 mg total) into the skin daily. 135 mL 0  . escitalopram (LEXAPRO) 20 MG tablet Take  20 mg by mouth daily.     . famotidine (PEPCID) 20 MG tablet Take 1 tablet (20 mg total) by mouth daily. 30 tablet 0  . folic acid (FOLVITE) 1 MG tablet Take 1 tablet (1 mg total) by mouth daily. 30 tablet 3  . gabapentin (NEURONTIN) 100 MG capsule Take 100 mg by mouth at bedtime.    Marland Kitchen lisinopril (ZESTRIL) 5 MG tablet Take 5 mg by mouth daily.    . magnesium oxide (MAG-OX) 400 (241.3 Mg) MG tablet Take 1 tablet (400 mg total) by mouth in the morning, at noon, and at bedtime. 90 tablet 0  . Multiple Vitamin (MULTIVITAMIN WITH MINERALS) TABS tablet Take 1 tablet by mouth daily. 30 tablet 0  . Potassium Chloride ER 20 MEQ TBCR Take 20 mEq by mouth daily. (Patient not taking: Reported on 05/18/2019) 7 tablet 0  . prochlorperazine (COMPAZINE) 10 MG tablet Take 1 tablet (10 mg total) by mouth every 6 (six) hours as needed for nausea or vomiting. (Patient not taking: Reported on 12/31/2018) 30 tablet 0   No current facility-administered medications for this visit.   Facility-Administered Medications Ordered in Other Visits  Medication Dose Route Frequency Provider Last Rate Last Admin  . CARBOplatin (PARAPLATIN) 530 mg in sodium chloride 0.9 % 250 mL chemo infusion  530 mg Intravenous Once Curt Bears, MD      . PEMEtrexed (ALIMTA) 900 mg in sodium chloride 0.9 % 100 mL chemo infusion  380 mg/m2 (Treatment Plan Recorded) Intravenous Once Curt Bears, MD 816 mL/hr at 06/06/19 1501 900 mg at 06/06/19 1501    SURGICAL HISTORY:  Past Surgical History:  Procedure Laterality Date  . CARDIAC SURGERY    . CHEST TUBE INSERTION Right 05/16/2018   Procedure: INSERTION PLEURAL DRAINAGE CATHETER;  Surgeon: Ivin Poot, MD;  Location: [facility];  Service: Thoracic;  Laterality: Right;  . CHEST TUBE INSERTION Right 05/16/2018   Procedure: Chest Tube Insertion;  Surgeon: Ivin Poot, MD;  Location: [facility];  Service: Thoracic;  Laterality: Right;  . LOWER EXTREMITY VENOGRAPHY Left 05/20/2019   Procedure:  LOWER EXTREMITY VENOGRAPHY/POSSIBLE THROMBECTOMY;  Surgeon: Waynetta Sandy, MD;  Location: [facility];  Service: Cardiovascular;  Laterality: Left;  . PACEMAKER INSERTION    . PERIPHERAL VASCULAR BALLOON ANGIOPLASTY Left 05/20/2019   Procedure: PERIPHERAL VASCULAR BALLOON ANGIOPLASTY;  Surgeon: Waynetta Sandy, MD;  Location: [facility];  Service: Cardiovascular;  Laterality: Left;  Femoral, common femoral, exterrnal iliac, and common illiac  . REMOVAL OF PLEURAL DRAINAGE CATHETER Right 08/30/2018   Procedure: REMOVAL OF PLEURAL DRAINAGE CATHETER;  Surgeon: Ivin Poot, MD;  Location: [facility];  Service: Thoracic;  Laterality: Right;    REVIEW OF SYSTEMS:   Review of Systems  Constitutional: Negative for appetite change, chills, fatigue, fever and unexpected weight change.  HENT: Negative for mouth sores, nosebleeds, sore throat and trouble swallowing.   Eyes: Negative for eye problems and icterus.  Respiratory: Negative for cough, hemoptysis,  shortness of breath and wheezing.  Cardiovascular: Negative for chest pain. Gastrointestinal: Negative for abdominal pain, constipation, diarrhea, nausea and vomiting.  Genitourinary: Negative for bladder incontinence, difficulty urinating, dysuria, frequency and hematuria.   Musculoskeletal: Positive for left leg swelling. Negative for back pain, gait problem, neck pain and neck stiffness.  Skin: Negative for itching and rash.  Neurological: Negative for dizziness, extremity weakness, gait problem, headaches, light-headedness and seizures.  Hematological: Negative for adenopathy. Does not bruise/bleed easily.  Psychiatric/Behavioral: Negative for confusion, depression and sleep disturbance. The patient is not nervous/anxious.     PHYSICAL EXAMINATION:  Blood pressure (!) 177/105, pulse 93, temperature 98.2 F (36.8 C), temperature source Oral, resp. rate 19, height 6' (1.829 m), weight 227 lb 1.6 oz (103 kg), SpO2  98 %.  ECOG PERFORMANCE STATUS: 1 - Symptomatic but completely ambulatory  Physical Exam  Constitutional: Oriented to person, place, and time and well-developed, well-nourished, and in no distress.  HENT:  Head: Normocephalic and atraumatic.  Mouth/Throat: Oropharynx is clear and moist. No oropharyngeal exudate.  Eyes: Conjunctivae are normal. Right eye exhibits no discharge. Left eye exhibits no discharge. No scleral icterus.  Neck: Normal range of motion. Neck supple.  Cardiovascular: Normal rate, regular rhythm, normal heart sounds and intact distal pulses.   Pulmonary/Chest: Effort normal and breath sounds normal. No respiratory distress. No wheezes. No rales.  Abdominal: Soft. Bowel sounds are normal. Exhibits no distension and no mass. There is no tenderness.  Musculoskeletal: Positive for left leg swelling. Normal range of motion.  Lymphadenopathy:    No cervical adenopathy.  Neurological: Alert and oriented to person, place, and time. Exhibits normal muscle tone. Gait normal. Coordination normal.  Skin: Skin is warm and dry. No rash noted. Not diaphoretic. No erythema. No pallor.  Psychiatric: Mood, memory and judgment normal.  Vitals reviewed.  LABORATORY DATA: Lab Results  Component Value Date   WBC 10.4 06/06/2019   HGB 16.4 06/06/2019   HCT 47.2 06/06/2019   MCV 96.3 06/06/2019   PLT 139 (L) 06/06/2019      Chemistry      Component Value Date/Time   NA 143 06/06/2019 1044   K 3.8 06/06/2019 1044   CL 100 06/06/2019 1044   CO2 31 06/06/2019 1044   BUN 5 (L) 06/06/2019 1044   CREATININE 1.11 06/06/2019 1044      Component Value Date/Time   CALCIUM 10.3 06/06/2019 1044   ALKPHOS 89 06/06/2019 1044   AST 27 06/06/2019 1044   ALT 30 06/06/2019 1044   BILITOT 0.6 06/06/2019 1044       RADIOGRAPHIC STUDIES:  CT ABDOMEN PELVIS W CONTRAST  Result Date: 05/18/2019 CLINICAL DATA:  59 year old male with history of small cell lung cancer presents with left groin  pain. Patient is on heparin for left lower extremity DVT. EXAM: CT ABDOMEN AND PELVIS WITH CONTRAST TECHNIQUE: Multidetector CT imaging of the abdomen and pelvis was performed using the standard protocol following bolus administration of intravenous contrast. CONTRAST:  127mL OMNIPAQUE IOHEXOL 300 MG/ML  SOLN COMPARISON:  CT abdomen pelvis dated 12/25/2018. Chest CT dated 04/04/2019 FINDINGS: Lower chest: Partially visualized thickened and nodular right pleural surface consistent with metastatic disease. A small right pleural effusion again noted in keeping with malignant effusion. A 5 x 8 cm mass/metastatic implant/adenopathy noted at the right cardiophrenic angle. Partially visualized pacemaker wires. No intra-abdominal free air or free fluid. Hepatobiliary: There is mild fatty infiltration of the liver. There is a 2 cm cyst in the dome  of the liver. Slight irregularity of the liver contour may represent early changes of cirrhosis or pseudocirrhosis. No intrahepatic biliary ductal dilatation. The gallbladder is unremarkable. Pancreas: Unremarkable. No pancreatic ductal dilatation or surrounding inflammatory changes. Spleen: Normal in size without focal abnormality. Adrenals/Urinary Tract: The adrenal glands are unremarkable. There is no hydronephrosis on either side. There is symmetric enhancement and excretion of contrast by both kidneys. The visualized ureters and urinary bladder appear unremarkable. Stomach/Bowel: There is moderate stool throughout the colon. No bowel obstruction or active inflammation. The appendix is normal. Vascular/Lymphatic: Mild aortoiliac atherosclerotic disease. There is enlargement of the left common femoral vein and left external iliac vein with apparent hypodense content concerning for extension of known DVT into the left external or left common iliac vein. Evaluation of the venous system however is limited due to suboptimal opacification and timing of the contrast. The SMV, splenic  vein, and main portal vein are patent. No portal venous gas. Several small but mildly rounded retroperitoneal lymph nodes appear larger compared to the prior CT. Reproductive: The prostate and seminal vesicles are grossly unremarkable. Other: None Musculoskeletal: Degenerative changes of the spine. No acute osseous pathology. IMPRESSION: 1. Enlargement of the left common femoral and left external iliac veins with apparent hypodense content concerning for extension of known DVT into the left external or left common iliac vein. Evaluation of the venous system however is limited due to suboptimal opacification and timing of the contrast. 2. Slightly rounded retroperitoneal lymph nodes, increased in size compared to the prior CT. 3. No bowel obstruction. Normal appendix. 4. Mild fatty infiltration of the liver with findings of possible early cirrhosis or pseudocirrhosis. 5. 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 6. A 5 x 8 cm mass/metastatic implant at the right cardiophrenic angle. 7. Aortic Atherosclerosis (ICD10-I70.0). These results were called by telephone at the time of interpretation on 05/18/2019 at 10:50 pm to provider Trilby Drummer , who verbally acknowledged these results. Electronically Signed   By: Anner Crete M.D.   On: 05/18/2019 23:07   PERIPHERAL VASCULAR CATHETERIZATION  Result Date: 05/20/2019 Patient name: Eulis Salazar MRN: [id number] DOB: 1960/06/17 Sex: male 05/20/2019 Pre-operative Diagnosis: Extensive left lower extremity DVT Post-operative diagnosis:  Same Surgeon:  Erlene Quan C. Donzetta Matters, MD Procedure Performed: 1.  Ultrasound-guided cannulation left popliteal vein 2.  Intravascular ultrasound of inferior vena cava, left common external leg veins, left common femoral vein and femoral vein 3.  Left lower extremity venography and central venography 4.  Inari mechanical thrombectomy of left lower extremity including femoral vein, common femoral vein, left  external and common iliac veins and IVC 5.  Balloon angioplasty of left femoral vein, left common femoral vein, left common external veins 6.  Moderate sedation with fentanyl Versed for 72 minutes Indications: 59 year old male with history of advanced lung cancer.  Now has signs of left lower extremity DVT.  He is indicated for the above procedure. Findings: There is acute and chronic appearing thrombus by both intravascular ultrasound and grossly after mechanical thrombectomy.  This extended from the femoral vein which appeared occluded initially up to the common femoral vein where there was significant disease profunda was likely patent prior.  The common external leg veins were occluded and there was a tongue of thrombus into the IVC initially.  After mechanical thrombectomy and balloon angioplasty the common external leg veins were patent there was no further clot in the IVC.  There was probably 50% chronic appearing thrombus in the common  femoral vein the profunda appeared patent.  There was no reflux.  There was a channel through the femoral vein minimal by IVUS but good flow by venography.  Procedure:  The patient was identified in the holding area and taken to room 8.  The patient was then placed supine on the table and prepped and draped in the usual sterile fashion.  A time out was called.  Ultrasound was used to evaluate the left popliteal vein.  This was not compressible.  Initially cannulated micropuncture needle and attempted to pass a wire but could not.  I then remove the wire needle.  I then use 18-gauge needle was able to get a versa core wire to pass enough to get a micropuncture sheath in place and then placed a Glidewire advantage.  An 8 French sheath was placed.  His heparin drip was stopped and 8000 use of heparin were given.  We then gave an additional 5000.  Using Glidewire [place] catheter I was able to reverse centrally confirmed IVC access.  The wire was placed all the way to the IJ  vein.  We performed intravascular ultrasound with the above findings.  We then exchanged for the mechanical thrombectomy sheath.  I did have the balloon this open with 8 balloon.  We then performed mechanical thrombectomy with 6 separate passes.  I did balloon angioplasty the femoral vein as well as the common femoral vein and common external leg veins with a balloon.  Completion intravascular ultrasound demonstrated much improvement in flow from the common femoral vein into the external and common leg veins no further clot in the IVC.  There was only minimal channel in the femoral vein.  Completion venography actually demonstrated a large channel in the femoral vein with no reflux.  Satisfied with this I elected not to lyse the patient there is no place to stent.  I removed the sheath and sutured a bolster in place.  He tolerated procedure without immediate complication. Contrast: 20cc Brandon C. Donzetta Matters, MD [facility] Office: [phone] Pager: [phone]  VAS [place] IVC/ILIAC (VENOUS ONLY)  Result Date: 05/19/2019 IVC/ILIAC STUDY Indications: DVT in left CFV and EIV Other Factors: History of lung cancer. ETOH abuse. Limitations: Air/bowel gas and patient pressing abdomen against probe and taking deep breaths.  Comparison Study: No prior study on file Performing Technologist: Sharion Dove RVS  Examination Guidelines: A complete evaluation includes B-mode imaging, spectral Doppler, color Doppler, and power Doppler as needed of all accessible portions of each vessel. Bilateral testing is considered an integral part of a complete examination. Limited examinations for reoccurring indications may be performed as noted.  +-------------------+---------+-----------+---------+-----------+--------+         CIV        RT-PatentRT-ThrombusLT-PatentLT-ThrombusComments +-------------------+---------+-----------+---------+-----------+--------+ Common Iliac Prox                                   acute            +-------------------+---------+-----------+---------+-----------+--------+ Common Iliac Mid                                   acute            +-------------------+---------+-----------+---------+-----------+--------+ Common Iliac Distal  acute            +-------------------+---------+-----------+---------+-----------+--------+  +-------------------------+---------+-----------+---------+-----------+--------+            EIV           RT-PatentRT-ThrombusLT-PatentLT-ThrombusComments +-------------------------+---------+-----------+---------+-----------+--------+ External Iliac Vein Prox                                 acute            +-------------------------+---------+-----------+---------+-----------+--------+ External Iliac Vein Mid                                  acute            +-------------------------+---------+-----------+---------+-----------+--------+ External Iliac Vein                                      acute            Distal                                                                    +-------------------------+---------+-----------+---------+-----------+--------+   Summary: IVC/Iliac: There is evidence of acute thrombus involving the left common iliac vein. There is evidence of acute thrombus involving the left external iliac vein. Visualization of proximal Inferior Vena Cava, mid inferior vena cava and distal Inferior Vena  Cava was limited. Probable thrombus in the IVC, however, unable to adequately visualize.  *See table(s) above for measurements and observations.  Electronically signed by Monica Martinez MD on 05/19/2019 at 11:05:05 AM.    Final    VAS [place] LOWER EXTREMITY VENOUS (DVT) (MC and WL 7a-7p)  Result Date: 05/19/2019  Lower Venous DVTStudy Indications: Edema.  Risk Factors: Cancer History of lung cancer, S/P chemotherapy. Comparison Study: No prior study on file  Performing Technologist: Sharion Dove RVS  Examination Guidelines: A complete evaluation includes B-mode imaging, spectral Doppler, color Doppler, and power Doppler as needed of all accessible portions of each vessel. Bilateral testing is considered an integral part of a complete examination. Limited examinations for reoccurring indications may be performed as noted. The reflux portion of the exam is performed with the patient in reverse Trendelenburg.  +-----+---------------+---------+-----------+----------+--------------+ RIGHTCompressibilityPhasicitySpontaneityPropertiesThrombus Aging +-----+---------------+---------+-----------+----------+--------------+ CFV  Full           Yes      Yes                                 +-----+---------------+---------+-----------+----------+--------------+   +---------+---------------+---------+-----------+----------+------------------+ LEFT     CompressibilityPhasicitySpontaneityPropertiesThrombus Aging     +---------+---------------+---------+-----------+----------+------------------+ CFV      None                                         Acute              +---------+---------------+---------+-----------+----------+------------------+ SFJ      None  Acute              +---------+---------------+---------+-----------+----------+------------------+ FV Prox  None                                         Acute              +---------+---------------+---------+-----------+----------+------------------+ FV Mid   None                                         Acute              +---------+---------------+---------+-----------+----------+------------------+ FV DistalNone                                         Acute              +---------+---------------+---------+-----------+----------+------------------+ POP      None                                         Acute               +---------+---------------+---------+-----------+----------+------------------+ PTV      None                                         Acute              +---------+---------------+---------+-----------+----------+------------------+ PERO     None                                         Acute              +---------+---------------+---------+-----------+----------+------------------+ GSV      None                                         Acute, throughout                                                        thigh              +---------+---------------+---------+-----------+----------+------------------+ EIV      None                                         Acute              +---------+---------------+---------+-----------+----------+------------------+     Summary: RIGHT: - No evidence of common femoral vein obstruction.  LEFT: - Findings consistent with acute deep vein thrombosis involving the left common femoral vein, SF junction, left femoral vein, left popliteal vein, left posterior tibial veins,  left peroneal veins, and EIV. - Findings consistent with acute superficial vein thrombosis involving the left great saphenous vein.  *See table(s) above for measurements and observations. Electronically signed by Monica Martinez MD on 05/19/2019 at 11:04:36 AM.    Final      ASSESSMENT/PLAN:  This is avery pleasant 59 year old Caucasian male with metastatic non-small cell lung cancer, adenocarcinoma. He presented with a right upper lobe lung mass with mediastinal lymphadenopathy and a right malignant pleural effusion. He was diagnosed in January 2020  The patienthad underwent systemic chemotherapy with carboplatin for an AUC of 5, Alimta 500 mg/m2, and Keytruda 200 mg IV every 3 weeks. The patient is status post 4 cycles of chemotherapy; however,Keytruda was discontinued after cycle #2 due to a significant skin rash. His dose of carboplatin was scheduled to bereduced  to AUC of 4starting from cycle #5 as well asAlimta reduced to 400 mg/m due to concerns with tolerability and toxicity. The patientmissed several rescheduled appointments for chemotherapy following his hospitalizationdue to his memory issues.  He showed evidence of disease progression in January 2021. The plan was to restart chemotherapy with Carboplatin and Alimta. He missed several appointments and did not resume his chemotherapy.   The patient was recently hospitalized for DVT.  He is receiving Lovenox shots with the help of a home health nurse.   The patient was seen with Dr. Julien Nordmann today.  Dr. Julien Nordmann had a lengthy discussion with the patient about his current condition and treatment options.  Dr. Julien Nordmann stressed the importance of needing to monitor him closely if he resumes chemotherapy, including weekly lab work. Offered resuming treatment vs. Referral to palliative care/hospice. The patient is interested in resuming chemotherapy.  He will receive cycle #1 of carboplatin and Alimta today as scheduled.  I provided the patient with a printout of his appointments.  I also sent a message to the transportation services department to assist the patient with transportation to his upcoming appointments.  The patient does not drive due to his short term memory issues. He states he cannot remember where he is going once he starts driving. He does not have any family nearby.   I also tried to reach out to the patient's home health nurse to discuss his current condition. Pending a call back.   I spent several minutes with the patient showing him how to retrieve his voicemails as well as empty his voicemail box.  We have had a very challenging time reaching the patient recently to reschedule appointments/transportation assistance due to his voicemail being full.  The patient did not take his blood pressure medicine this morning.  The patient was given 0.2 mg of clonidine and his repeat blood pressure  was 157/98.  The patient states that his home health nurse monitors his blood pressure at home when she comes by daily.  Discussed concerning signs and symptoms with the patient that warrant emergency evaluation for hypertension.  The patient was advised to call immediately if he has any concerning symptoms in the interval. The patient voices understanding of current disease status and treatment options and is in agreement with the current care plan. All questions were answered. The patient knows to call the clinic with any problems, questions or concerns. We can certainly see the patient much sooner if necessary   No orders of the defined types were placed in this encounter.    Song Myre L Yaniah Thiemann, PA-C 06/06/19  ADDENDUM: Hematology/Oncology Attending: I had a face-to-face encounter with the patient.  I  recommended his care plan.  This is a very pleasant 59 years old white male with metastatic non-small cell lung cancer, adenocarcinoma and currently on systemic chemotherapy with carboplatin, and Alimta.  His treatment with [name] was discontinued secondary to severe skin rash.  The patient has been noncompliant with his chemo treatment.  He missed several appointment and has not been on treatment since January 2020.  He has a lot of memory issues and does not show up for his treatment overlap as planned.  He was recently diagnosed with DVT and he currently have a visiting nurse coming to the group facility where he lives.  She does remind him of his appointment and he is here today for evaluation before resuming systemic chemotherapy again. We will proceed with cycle #5 today as planned. For the DVT he will continue his current treatment with Lovenox at home by the home health nurse. The patient will come back for follow-up visit in 3 weeks for evaluation before the next cycle of his treatment. He was advised to call immediately if he has any concerning symptoms in the  interval.  Disclaimer: This note was dictated with voice recognition software. Similar sounding words can inadvertently be transcribed and may be missed upon review. Eilleen Kempf, MD 06/07/19

## 2019-06-06 NOTE — Progress Notes (Signed)
Per Dr. Julien Nordmann: OK to treat with elevated BP

## 2019-06-06 NOTE — Patient Instructions (Signed)
COVID-19 Vaccine Information can be found at: [email or web address] For questions related to vaccine distribution or appointments, please email [email or web address] or call [phone].   [facility] Discharge Instructions for Patients Receiving Chemotherapy  Today you received the following chemotherapy agents: Pemetrexed (Alimta) and Carboplatin (Paraplatin)  To help prevent nausea and vomiting after your treatment, we encourage you to take your nausea medication as directed by your provider.   If you develop nausea and vomiting that is not controlled by your nausea medication, call the clinic.   BELOW ARE SYMPTOMS THAT SHOULD BE REPORTED IMMEDIATELY:  *FEVER GREATER THAN 100.5 F  *CHILLS WITH OR WITHOUT FEVER  NAUSEA AND VOMITING THAT IS NOT CONTROLLED WITH YOUR NAUSEA MEDICATION  *UNUSUAL SHORTNESS OF BREATH  *UNUSUAL BRUISING OR BLEEDING  TENDERNESS IN MOUTH AND THROAT WITH OR WITHOUT PRESENCE OF ULCERS  *URINARY PROBLEMS  *BOWEL PROBLEMS  UNUSUAL RASH Items with * indicate a potential emergency and should be followed up as soon as possible.  Feel free to call the clinic should you have any questions or concerns. The clinic phone number is [phone].  Please show the [facility] at check-in to the Emergency Department and triage nurse.

## 2019-06-06 NOTE — Patient Instructions (Signed)
-  Please make sure to take your blood pressure. It is very high. If you get symptoms dizziness, chest pain, numbness of tingling, extremity weakness, slurred speech, etc please go to the emergency room -Please make sure to check your calender every day. -If you see a missed call from [place] [phone] please call us back.  -Please make sure to check your voicemail daily by pressing/dialing *[phone]

## 2019-06-07 ENCOUNTER — Encounter: Payer: Self-pay | Admitting: Physician Assistant

## 2019-06-07 ENCOUNTER — Encounter: Payer: Self-pay | Admitting: General Practice

## 2019-06-07 NOTE — Progress Notes (Signed)
[facility] Spiritual Care Note  Referred by Willodean Rosenthal for emotional support. Reached Daniel Bryant by phone, providing pastoral listening as he shared about his use of humor for making meaning and coping, as well as his time in the [employer] as a deeply formative experience. The phone call got disconnected, but left voicemail with my direct dial number and encouragement to call back as needed/desired.   [place], [place], [facility] Pager [phone] Voicemail [phone]

## 2019-06-13 ENCOUNTER — Inpatient Hospital Stay: Payer: No Typology Code available for payment source | Attending: Internal Medicine

## 2019-06-13 ENCOUNTER — Other Ambulatory Visit: Payer: Self-pay

## 2019-06-13 DIAGNOSIS — L270 Generalized skin eruption due to drugs and medicaments taken internally: Secondary | ICD-10-CM | POA: Insufficient documentation

## 2019-06-13 DIAGNOSIS — D701 Agranulocytosis secondary to cancer chemotherapy: Secondary | ICD-10-CM | POA: Insufficient documentation

## 2019-06-13 DIAGNOSIS — I10 Essential (primary) hypertension: Secondary | ICD-10-CM | POA: Diagnosis not present

## 2019-06-13 DIAGNOSIS — C3411 Malignant neoplasm of upper lobe, right bronchus or lung: Secondary | ICD-10-CM | POA: Insufficient documentation

## 2019-06-13 DIAGNOSIS — C3491 Malignant neoplasm of unspecified part of right bronchus or lung: Secondary | ICD-10-CM

## 2019-06-13 LAB — CMP (CANCER CENTER ONLY)
ALT: 28 U/L (ref 0–44)
AST: 25 U/L (ref 15–41)
Albumin: 3.5 g/dL (ref 3.5–5.0)
Alkaline Phosphatase: 94 U/L (ref 38–126)
Anion gap: 11 (ref 5–15)
BUN: 16 mg/dL (ref 8–23)
CO2: 28 mmol/L (ref 22–32)
Calcium: 8.7 mg/dL — ABNORMAL LOW (ref 8.9–10.3)
Chloride: 98 mmol/L (ref 98–111)
Creatinine: 1.04 mg/dL (ref 0.61–1.24)
GFR, Est AFR Am: >60 mL/min (ref >60)
GFR, Estimated: >60 mL/min (ref >60)
Glucose, Bld: 105 mg/dL — ABNORMAL HIGH (ref 70–99)
Potassium: 4.1 mmol/L (ref 3.5–5.1)
Sodium: 137 mmol/L (ref 135–145)
Total Bilirubin: 1.1 mg/dL (ref 0.3–1.2)
Total Protein: 7.4 g/dL (ref 6.5–8.1)

## 2019-06-13 LAB — CBC WITH DIFFERENTIAL (CANCER CENTER ONLY)
Abs Immature Granulocytes: 0.02 10*3/uL (ref 0.00–0.07)
Basophils Absolute: 0 10*3/uL (ref 0.0–0.1)
Basophils Relative: 1 %
Eosinophils Absolute: 0.4 10*3/uL (ref 0.0–0.5)
Eosinophils Relative: 11 %
HCT: 44.9 % (ref 39.0–52.0)
Hemoglobin: 15.8 g/dL (ref 13.0–17.0)
Immature Granulocytes: 1 %
Lymphocytes Relative: 23 %
Lymphs Abs: 0.9 10*3/uL (ref 0.7–4.0)
MCH: 34.3 pg — ABNORMAL HIGH (ref 26.0–34.0)
MCHC: 35.2 g/dL (ref 30.0–36.0)
MCV: 97.4 fL (ref 80.0–100.0)
Monocytes Absolute: 0.1 10*3/uL (ref 0.1–1.0)
Monocytes Relative: 2 %
Neutro Abs: 2.3 10*3/uL (ref 1.7–7.7)
Neutrophils Relative %: 62 %
Platelet Count: 82 10*3/uL — ABNORMAL LOW (ref 150–400)
RBC: 4.61 MIL/uL (ref 4.22–5.81)
RDW: 12.3 % (ref 11.5–15.5)
WBC Count: 3.7 10*3/uL — ABNORMAL LOW (ref 4.0–10.5)
nRBC: 0 % (ref 0.0–0.2)

## 2019-06-16 IMAGING — DX DG CHEST 1V PORT
1 series · 1 of 1 positions shown · non-contrast
Comparison: 05/19/2018

CLINICAL DATA: Follow-up chest tube removal

EXAM:
PORTABLE CHEST 1 VIEW

[chest ap]
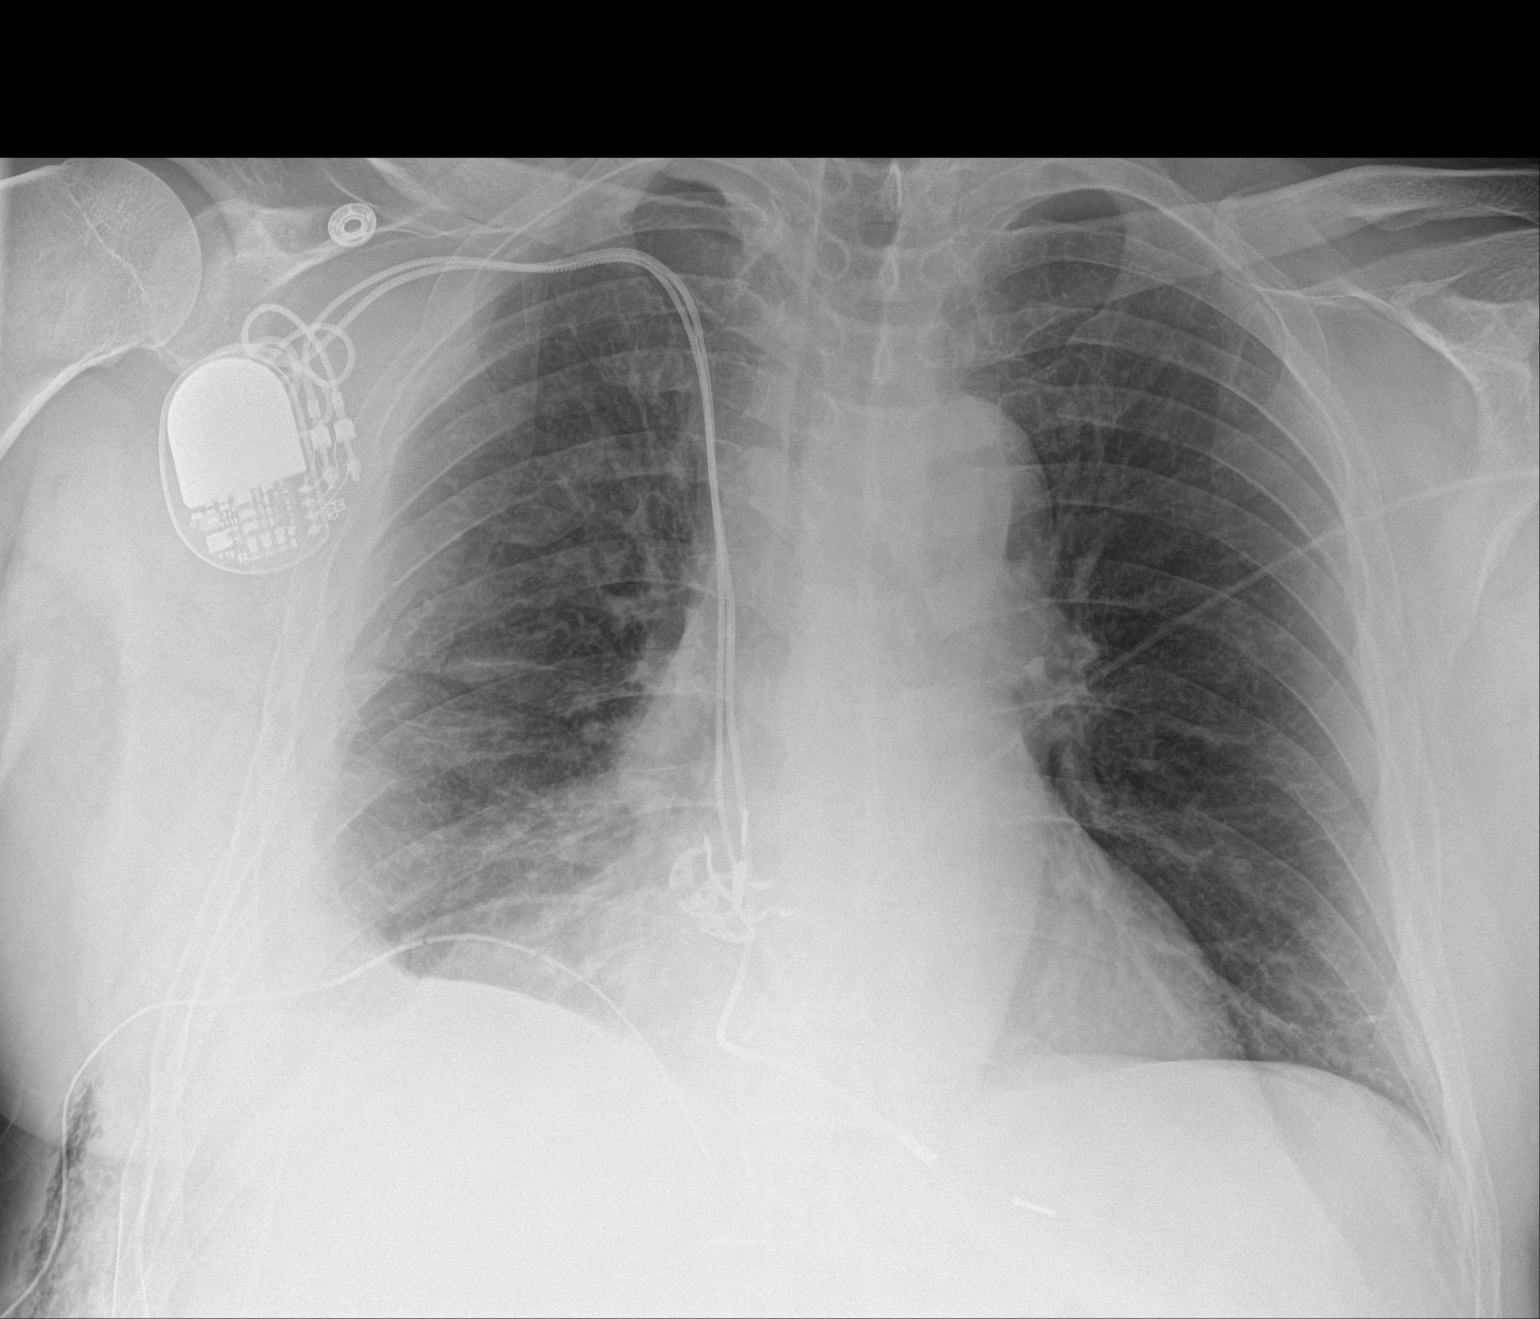

[1 of 1 positions shown; findings below may reference images not displayed]

FINDINGS: Cardiac shadow is stable. Pacing device is again seen. PleurX
catheter is noted over the right base. No definitive pneumothorax is
seen. Minimal pleural fluid is noted laterally. No other focal
abnormality is noted.
IMPRESSION: Stable appearance of the chest from the previous day.

## 2019-06-17 ENCOUNTER — Telehealth: Payer: Self-pay | Admitting: Medical Oncology

## 2019-06-17 NOTE — Telephone Encounter (Signed)
Male voice left message-"covered in a rash like the last time".  I returned call and left message on Darryll and Jackie's phone  to go to ED if symptoms progress to SOB, trouble breathing , chest pain. . Alimta and Carboplatin on 3/25.

## 2019-06-17 NOTE — Telephone Encounter (Signed)
He can come and see Lucianne Lei at the [facility].

## 2019-06-18 ENCOUNTER — Telehealth: Payer: Self-pay | Admitting: Medical Oncology

## 2019-06-18 NOTE — Telephone Encounter (Signed)
09/18/19-appt confirmed with pt  for  labs and [employer] . Transportation email sent for pick up .

## 2019-06-19 ENCOUNTER — Other Ambulatory Visit: Payer: Self-pay | Admitting: Physician Assistant

## 2019-06-19 ENCOUNTER — Inpatient Hospital Stay (HOSPITAL_BASED_OUTPATIENT_CLINIC_OR_DEPARTMENT_OTHER): Payer: No Typology Code available for payment source | Admitting: Medical

## 2019-06-19 ENCOUNTER — Inpatient Hospital Stay: Payer: No Typology Code available for payment source

## 2019-06-19 ENCOUNTER — Telehealth: Payer: Self-pay | Admitting: *Deleted

## 2019-06-19 ENCOUNTER — Other Ambulatory Visit: Payer: Self-pay

## 2019-06-19 VITALS — BP 159/103 | HR 96 | Temp 97.8°F | Resp 18 | Ht 72.0 in | Wt 223.5 lb

## 2019-06-19 DIAGNOSIS — C3411 Malignant neoplasm of upper lobe, right bronchus or lung: Secondary | ICD-10-CM | POA: Diagnosis not present

## 2019-06-19 DIAGNOSIS — E876 Hypokalemia: Secondary | ICD-10-CM

## 2019-06-19 DIAGNOSIS — L270 Generalized skin eruption due to drugs and medicaments taken internally: Secondary | ICD-10-CM | POA: Diagnosis not present

## 2019-06-19 DIAGNOSIS — C3491 Malignant neoplasm of unspecified part of right bronchus or lung: Secondary | ICD-10-CM

## 2019-06-19 DIAGNOSIS — D701 Agranulocytosis secondary to cancer chemotherapy: Secondary | ICD-10-CM | POA: Diagnosis not present

## 2019-06-19 DIAGNOSIS — T451X5A Adverse effect of antineoplastic and immunosuppressive drugs, initial encounter: Secondary | ICD-10-CM | POA: Diagnosis not present

## 2019-06-19 LAB — CBC WITH DIFFERENTIAL (CANCER CENTER ONLY)
Abs Immature Granulocytes: 0 10*3/uL (ref 0.00–0.07)
Basophils Absolute: 0 10*3/uL (ref 0.0–0.1)
Basophils Relative: 0 %
Eosinophils Absolute: 0.8 10*3/uL — ABNORMAL HIGH (ref 0.0–0.5)
Eosinophils Relative: 31 %
HCT: 37.4 % — ABNORMAL LOW (ref 39.0–52.0)
Hemoglobin: 13.5 g/dL (ref 13.0–17.0)
Immature Granulocytes: 0 %
Lymphocytes Relative: 36 %
Lymphs Abs: 1 10*3/uL (ref 0.7–4.0)
MCH: 33.6 pg (ref 26.0–34.0)
MCHC: 36.1 g/dL — ABNORMAL HIGH (ref 30.0–36.0)
MCV: 93 fL (ref 80.0–100.0)
Monocytes Absolute: 0.3 10*3/uL (ref 0.1–1.0)
Monocytes Relative: 12 %
Neutro Abs: 0.6 10*3/uL — ABNORMAL LOW (ref 1.7–7.7)
Neutrophils Relative %: 21 %
Platelet Count: 29 10*3/uL — ABNORMAL LOW (ref 150–400)
RBC: 4.02 MIL/uL — ABNORMAL LOW (ref 4.22–5.81)
RDW: 11.9 % (ref 11.5–15.5)
WBC Count: 2.8 10*3/uL — ABNORMAL LOW (ref 4.0–10.5)
nRBC: 0 % (ref 0.0–0.2)

## 2019-06-19 LAB — CMP (CANCER CENTER ONLY)
ALT: 46 U/L — ABNORMAL HIGH (ref 0–44)
AST: 39 U/L (ref 15–41)
Albumin: 3.4 g/dL — ABNORMAL LOW (ref 3.5–5.0)
Alkaline Phosphatase: 100 U/L (ref 38–126)
Anion gap: 12 (ref 5–15)
BUN: 12 mg/dL (ref 8–23)
CO2: 32 mmol/L (ref 22–32)
Calcium: 8.5 mg/dL — ABNORMAL LOW (ref 8.9–10.3)
Chloride: 97 mmol/L — ABNORMAL LOW (ref 98–111)
Creatinine: 1.15 mg/dL (ref 0.61–1.24)
GFR, Est AFR Am: >60 mL/min (ref >60)
GFR, Estimated: >60 mL/min (ref >60)
Glucose, Bld: 114 mg/dL — ABNORMAL HIGH (ref 70–99)
Potassium: 3.1 mmol/L — ABNORMAL LOW (ref 3.5–5.1)
Sodium: 141 mmol/L (ref 135–145)
Total Bilirubin: 0.7 mg/dL (ref 0.3–1.2)
Total Protein: 6.6 g/dL (ref 6.5–8.1)

## 2019-06-19 MED ORDER — POTASSIUM CHLORIDE ER 20 MEQ PO TBCR: 20.0000 meq | EXTENDED_RELEASE_TABLET | Freq: Every day | ORAL | 0 refills | Status: DC

## 2019-06-19 MED ORDER — PREDNISONE 10 MG PO TABS: ORAL_TABLET | ORAL | 0 refills | Status: DC

## 2019-06-19 NOTE — Progress Notes (Signed)
Pt seen by PA Lucianne Lei only, no assessment by [facility] RN at this time.  PA aware.

## 2019-06-19 NOTE — Progress Notes (Signed)
Symptoms Management Clinic Progress Note   Daniel Bryant [id number] Jan 26, 1961 60 y.o.  [name] is managed by Dr. Fanny Bien. [name]  Actively treated with chemotherapy/immunotherapy/hormonal therapy: yes  Current therapy: carboplatin and Alimta  Last treated: 06/06/2019 (cycle #3 day #1)  Next scheduled appointment with provider: 06/27/2019  Assessment: Plan:    Adenocarcinoma of right lung (HCC)  Drug-induced skin rash - Plan: predniSONE (DELTASONE) 10 MG tablet  Chemotherapy-induced neutropenia (HCC)   Stage IV (T1b, N2, M1) non-small cell lung cancer, adenocarcinoma: Mr. Terral continues to be followed by Dr. Julien Nordmann. He is status post cycle #3 day #1 of carboplatin and Alimta which was dosed on 06/06/2019. He is scheduled to be seen next on 06/27/2019.  Drug-induced skin rash secondary to Alimta: Mr. Ingalls was given an 8 day prednisone taper which he will begin today.  Chemotherapy-induced neutropenia: A CBC returned with a WBC of 2.8 and an ANC of 0.6. Mr. Weed knows to call or return for fevers, chills, or other signs of infection.  Please see After Visit Summary for patient specific instructions.  Future Appointments  Date Time Provider [place]  06/25/2019  8:00 AM [facility] HS VASC 5 - AB [facility] VVS  06/25/2019  8:40 AM Marty Heck, MD [facility] V[facility]  06/27/2019 11:30 AM [facility] LAB 1 [facility] None  06/27/2019 12:00 PM Curt Bears, MD [facility] None  06/27/2019  1:00 PM [facility] INFUSION [facility] None  07/04/2019  2:00 PM [facility] LAB 5 [facility] None  07/11/2019  2:00 PM [facility] LAB 6 [facility] None  07/18/2019 11:45 AM [facility] LAB 2 [facility] None  07/18/2019 12:15 PM Curt Bears, MD [facility] None  07/18/2019  1:15 PM [facility] INFUSION [facility] None    No orders of the defined types were placed in this encounter.      Subjective:   Patient ID:  Daniel Bryant is a 59 y.o. (DOB  October 31, 1960) male.  Chief Complaint:  Chief Complaint  Patient presents with  . Rash    HPI Daniel Bryant  is a 59 y.o. male with a diagnosis of a stage IV (T1b, N2, M1) non-small cell lung cancer, adenocarcinoma. He continues to be followed by Dr. Julien Nordmann and is status post cycle #3 day #1 of carboplatin and Alimta which was dosed on 06/06/2019.  He has a history of a drug-induced rash secondary to his antibiotic therapy which is a new treatment of his non-small cell lung cancer.  He has developed a progressive bilateral lower extremity and upper extremity erythematous and pruritic rash with a limp.  He is scheduled to be seen in follow-up on 06/27/2019.  Medications: I have reviewed the patient's current medications.  Allergies:  Allergies  Allergen Reactions  . Other Other (See Comments)    Patient states "I had a scrotal procedure a couple of years ago, there was something they gave me to relax me and I started freaking out and seeing things".  Unable to ascertain from pt or his records what medication was.    Past Medical History:  Diagnosis Date  . Asthma   . Cancer ([place])   . COPD (chronic obstructive pulmonary disease) ([place])   . GERD (gastroesophageal reflux disease)   . High cholesterol   . History of petit-mal seizures   . Hyperlipidemia   . Hypertension   . Memory impairment   . OSA (obstructive sleep apnea)    Does not tolerate CPAP  . Pacemaker   . PTSD (post-traumatic stress  disorder)   . Sick sinus syndrome [facility])     Past Surgical History:  Procedure Laterality Date  . CARDIAC SURGERY    . CHEST TUBE INSERTION Right 05/16/2018   Procedure: INSERTION PLEURAL DRAINAGE CATHETER;  Surgeon: Ivin Poot, MD;  Location: [facility];  Service: Thoracic;  Laterality: Right;  . CHEST TUBE INSERTION Right 05/16/2018   Procedure: Chest Tube Insertion;  Surgeon: Ivin Poot, MD;  Location: [facility];  Service: Thoracic;  Laterality: Right;  . LOWER EXTREMITY VENOGRAPHY Left  05/20/2019   Procedure: LOWER EXTREMITY VENOGRAPHY/POSSIBLE THROMBECTOMY;  Surgeon: Waynetta Sandy, MD;  Location: [place] CV LAB;  Service: Cardiovascular;  Laterality: Left;  . PACEMAKER INSERTION    . PERIPHERAL VASCULAR BALLOON ANGIOPLASTY Left 05/20/2019   Procedure: PERIPHERAL VASCULAR BALLOON ANGIOPLASTY;  Surgeon: Waynetta Sandy, MD;  Location: [place] CV LAB;  Service: Cardiovascular;  Laterality: Left;  Femoral, common femoral, exterrnal iliac, and common illiac  . REMOVAL OF PLEURAL DRAINAGE CATHETER Right 08/30/2018   Procedure: REMOVAL OF PLEURAL DRAINAGE CATHETER;  Surgeon: Ivin Poot, MD;  Location: [facility] OR;  Service: Thoracic;  Laterality: Right;    Family History  Problem Relation Age of Onset  . Alzheimer's disease Mother   . Cancer Mother   . Memory loss Father     Social History   Socioeconomic History  . Marital status: Widowed    Spouse name: Not on file  . Number of children: Not on file  . Years of education: Not on file  . Highest education level: Not on file  Occupational History  . Not on file  Tobacco Use  . Smoking status: Former Smoker    Types: E-cigarettes  . Smokeless tobacco: Current User    Types: Chew  Substance and Sexual Activity  . Alcohol use: Not Currently  . Drug use: No  . Sexual activity: Not on file  Other Topics Concern  . Not on file  Social History Narrative  . Not on file   Social Determinants of Health   Financial Resource Strain:   . Difficulty of Paying Living Expenses:   Food Insecurity:   . Worried About Charity fundraiser in the Last Year:   . Arboriculturist in the Last Year:   Transportation Needs:   . Film/video editor (Medical):   Marland Kitchen Lack of Transportation (Non-Medical):   Physical Activity:   . Days of Exercise per Week:   . Minutes of Exercise per Session:   Stress:   . Feeling of Stress :   Social Connections:   . Frequency of Communication with Friends and Family:     . Frequency of Social Gatherings with Friends and Family:   . Attends Religious Services:   . Active Member of Clubs or Organizations:   . Attends Archivist Meetings:   Marland Kitchen Marital Status:   Intimate Partner Violence:   . Fear of Current or Ex-Partner:   . Emotionally Abused:   Marland Kitchen Physically Abused:   . Sexually Abused:     Past Medical History, Surgical history, Social history, and Family history were reviewed and updated as appropriate.   Please see review of systems for further details on the patient's review from today.   Review of Systems:  Review of Systems  Constitutional: Negative for chills, diaphoresis and fever.  HENT: Negative for facial swelling and trouble swallowing.   Respiratory: Negative for cough, chest tightness and shortness of breath.  Cardiovascular: Negative for chest pain.  Skin: Positive for rash.       Bilateral upper and lower extremity erythematous and pruritic rash.    Objective:   Physical Exam:  BP (!) 159/103 (BP Location: Left Arm, Patient Position: Sitting) Comment: patient refuse to retake. Learta Codding was notified  Pulse 96   Temp 97.8 F (36.6 C) (Temporal)   Resp 18   Ht 6' (1.829 m)   Wt 223 lb 8 oz (101.4 kg)   SpO2 99%   BMI 30.31 kg/m  ECOG: 0  Physical Exam Constitutional:      General: He is not in acute distress.    Appearance: Normal appearance. He is not ill-appearing, toxic-appearing or diaphoretic.  HENT:     Head: Normocephalic and atraumatic.  Eyes:     General: No scleral icterus.       Right eye: No discharge.        Left eye: No discharge.     Conjunctiva/sclera: Conjunctivae normal.  Skin:    General: Skin is warm and dry.     Findings: Erythema and rash present.     Comments: And extensive macular erythematous rash is noted over bilateral lower extremities with a lesser amount of a similar rash of the bilateral upper extremities.  Neurological:     Mental Status: He is alert.     Coordination:  Coordination normal.     Gait: Gait normal.  Psychiatric:        Mood and Affect: Mood normal.        Behavior: Behavior normal.        Thought Content: Thought content normal.        Judgment: Judgment normal.     Lab Review:     Component Value Date/Time   NA 141 06/19/2019 1026   K 3.1 (L) 06/19/2019 1026   CL 97 (L) 06/19/2019 1026   CO2 32 06/19/2019 1026   GLUCOSE 114 (H) 06/19/2019 1026   BUN 12 06/19/2019 1026   CREATININE 1.15 06/19/2019 1026   CALCIUM 8.5 (L) 06/19/2019 1026   PROT 6.6 06/19/2019 1026   ALBUMIN 3.4 (L) 06/19/2019 1026   AST 39 06/19/2019 1026   ALT 46 (H) 06/19/2019 1026   ALKPHOS 100 06/19/2019 1026   BILITOT 0.7 06/19/2019 1026   GFRNONAA >60 06/19/2019 1026   GFRAA >60 06/19/2019 1026       Component Value Date/Time   WBC 2.8 (L) 06/19/2019 1026   WBC 8.9 05/21/2019 0237   RBC 4.02 (L) 06/19/2019 1026   HGB 13.5 06/19/2019 1026   HCT 37.4 (L) 06/19/2019 1026   PLT 29 (L) 06/19/2019 1026   MCV 93.0 06/19/2019 1026   MCH 33.6 06/19/2019 1026   MCHC 36.1 (H) 06/19/2019 1026   RDW 11.9 06/19/2019 1026   LYMPHSABS 1.0 06/19/2019 1026   MONOABS 0.3 06/19/2019 1026   EOSABS 0.8 (H) 06/19/2019 1026   BASOSABS 0.0 06/19/2019 1026   -------------------------------  Imaging from last 24 hours (if applicable):  Radiology interpretation: No results found.      This case was discussed with Dr. Julien Nordmann. He expressed agreement with my management of this patient.

## 2019-06-19 NOTE — Patient Instructions (Signed)

## 2019-06-19 NOTE — Telephone Encounter (Signed)
Per [name] Hellingotter, PA, called to make pt aware that potassium supplement was sent to preferred pharmacy. Pt stated, "that's a long way to drive can you call to make sure it goes there?" Advised pt, prescription will be faxed over to [facility] on record. Pt declined to send prescription to different pharmacy

## 2019-06-20 ENCOUNTER — Other Ambulatory Visit: Payer: Self-pay | Admitting: *Deleted

## 2019-06-20 ENCOUNTER — Other Ambulatory Visit: Payer: No Typology Code available for payment source

## 2019-06-20 DIAGNOSIS — I82412 Acute embolism and thrombosis of left femoral vein: Secondary | ICD-10-CM

## 2019-06-21 NOTE — Progress Notes (Signed)
Pharmacist Chemotherapy Monitoring - Follow Up Assessment    I verify that I have reviewed each item in the below checklist:  . Regimen for the patient is scheduled for the appropriate day and plan matches scheduled date. Marland Kitchen Appropriate non-routine labs are ordered dependent on drug ordered. . If applicable, additional medications reviewed and ordered per protocol based on lifetime cumulative doses and/or treatment regimen.   Plan for follow-up and/or issues identified: No . I-vent associated with next due treatment: Yes . MD and/or nursing notified: No  Daniel Bryant 06/21/2019 11:13 AM

## 2019-06-24 ENCOUNTER — Telehealth (HOSPITAL_COMMUNITY): Payer: Self-pay

## 2019-06-24 NOTE — Telephone Encounter (Signed)

## 2019-06-25 ENCOUNTER — Encounter: Payer: No Typology Code available for payment source | Admitting: Vascular Surgery

## 2019-06-25 ENCOUNTER — Ambulatory Visit (HOSPITAL_COMMUNITY): Admission: RE | Admit: 2019-06-25 | Payer: No Typology Code available for payment source | Source: Ambulatory Visit

## 2019-06-27 ENCOUNTER — Inpatient Hospital Stay: Payer: No Typology Code available for payment source

## 2019-06-27 ENCOUNTER — Inpatient Hospital Stay: Payer: No Typology Code available for payment source | Admitting: Internal Medicine

## 2019-07-02 ENCOUNTER — Telehealth: Payer: Self-pay | Admitting: Medical Oncology

## 2019-07-02 NOTE — Telephone Encounter (Signed)
I LVM for Erline Levine to return my call to set up nursing to teach pt to give Lovenox. His friend [name] cannot give the injections.

## 2019-07-02 NOTE — Telephone Encounter (Signed)
Kellie Simmering is now pt caseworker. I left her a VM about needing nursing to see pt to instructed on giving lovenox injections.

## 2019-07-04 ENCOUNTER — Telehealth: Payer: Self-pay | Admitting: Medical Oncology

## 2019-07-04 ENCOUNTER — Inpatient Hospital Stay: Payer: No Typology Code available for payment source

## 2019-07-04 NOTE — Telephone Encounter (Signed)
Home health referral for Lovenox injection to teach pt .  Please fax progress note and H&P to nurse Heather at [phone]. Done .

## 2019-07-11 ENCOUNTER — Inpatient Hospital Stay: Payer: No Typology Code available for payment source

## 2019-07-12 NOTE — Progress Notes (Signed)
Pharmacist Chemotherapy Monitoring - Follow Up Assessment    I verify that I have reviewed each item in the below checklist:  . Regimen for the patient is scheduled for the appropriate day and plan matches scheduled date. Marland Kitchen Appropriate non-routine labs are ordered dependent on drug ordered. . If applicable, additional medications reviewed and ordered per protocol based on lifetime cumulative doses and/or treatment regimen.   Plan for follow-up and/or issues identified: Yes . I-vent associated with next due treatment: Yes . MD and/or nursing notified: No   Kennith Center, Pharm.D., CPP 07/12/2019@4 :09 PM

## 2019-07-18 ENCOUNTER — Inpatient Hospital Stay: Payer: No Typology Code available for payment source

## 2019-07-18 ENCOUNTER — Telehealth: Payer: Self-pay | Admitting: Medical Oncology

## 2019-07-18 ENCOUNTER — Inpatient Hospital Stay: Payer: No Typology Code available for payment source | Attending: Internal Medicine | Admitting: Internal Medicine

## 2019-07-18 NOTE — Telephone Encounter (Signed)
No show.  "I did not turn my calendar over to May. I don't have anything written on may 6th. My landlord took me to [facility] to get my money so I could pay rent".

## 2019-07-26 ENCOUNTER — Telehealth: Payer: Self-pay | Admitting: Internal Medicine

## 2019-07-26 ENCOUNTER — Encounter: Payer: Self-pay | Admitting: *Deleted

## 2019-07-26 NOTE — Progress Notes (Signed)
Notified scheduling that patient missed his appt this week and to re-schedule with Dr. Julien Nordmann next week.

## 2019-07-26 NOTE — Telephone Encounter (Signed)
Scheduled per 5/14 sch message. Pt aware of appts on 5/20.

## 2019-07-30 ENCOUNTER — Encounter: Payer: Self-pay | Admitting: *Deleted

## 2019-07-30 NOTE — Progress Notes (Signed)
I received a call from [facility].  They have questions about his lovenox.  Dr. Julien Nordmann did not prescribe this medication and I asked her to call Dr. Marianna Payment.  I gave her the phone number to call.

## 2019-07-31 ENCOUNTER — Telehealth: Payer: Self-pay | Admitting: *Deleted

## 2019-07-31 NOTE — Telephone Encounter (Signed)
I followed up on Daniel Bryant appts. He is to be seen with Dr. Julien Nordmann tomorrow.  I called to remind but was unable to reach or leave vm message.

## 2019-08-01 ENCOUNTER — Inpatient Hospital Stay: Payer: No Typology Code available for payment source | Admitting: Internal Medicine

## 2019-08-01 ENCOUNTER — Inpatient Hospital Stay: Payer: No Typology Code available for payment source

## 2019-08-05 ENCOUNTER — Telehealth (HOSPITAL_COMMUNITY): Payer: Self-pay

## 2019-08-05 NOTE — Telephone Encounter (Signed)

## 2019-08-06 ENCOUNTER — Encounter: Payer: No Typology Code available for payment source | Admitting: Vascular Surgery

## 2019-08-06 ENCOUNTER — Inpatient Hospital Stay (HOSPITAL_COMMUNITY): Admission: RE | Admit: 2019-08-06 | Payer: No Typology Code available for payment source | Source: Ambulatory Visit

## 2019-09-03 IMAGING — DX PORTABLE CHEST - 1 VIEW
1 series · 1 of 1 positions shown · non-contrast
Comparison: 06/12/2018

CLINICAL DATA: Lung cancer, weakness

EXAM:
PORTABLE CHEST 1 VIEW

[chest ap]
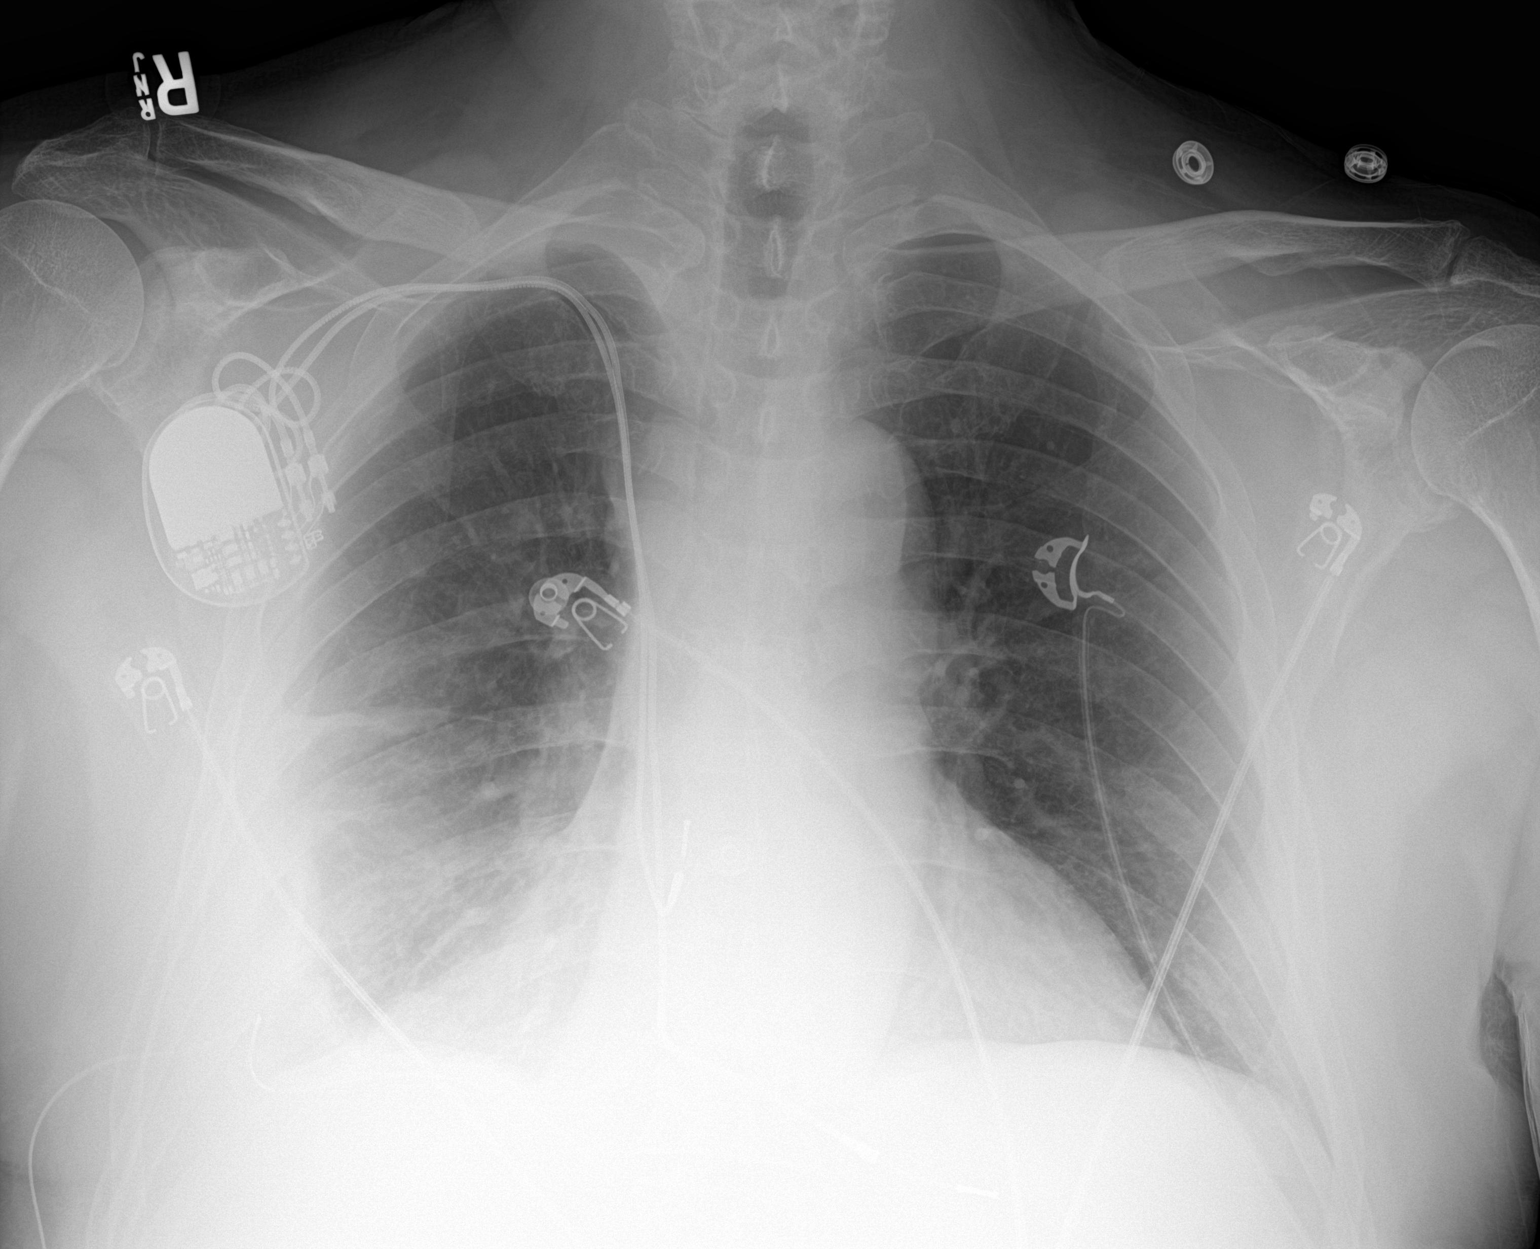

[1 of 1 positions shown; findings below may reference images not displayed]

FINDINGS: Right Port-A-Cath remains in place, unchanged. Heart is normal size.
Right PleurX catheter remains in place. Right pleural effusion
and/or pleural thickening has increased since prior study. No
pneumothorax. Right lower lobe atelectasis or infiltrate. Left lung
clear.
IMPRESSION: Increasing pleural density on the right which could reflect pleural
effusion and/or pleural thickening. No pneumothorax.

Right lower lobe atelectasis or infiltrate.

## 2019-09-04 ENCOUNTER — Inpatient Hospital Stay (HOSPITAL_COMMUNITY): Payer: No Typology Code available for payment source

## 2019-09-04 ENCOUNTER — Inpatient Hospital Stay (HOSPITAL_COMMUNITY)
Admission: EM | Admit: 2019-09-04 | Discharge: 2019-09-16 | DRG: 054 | Disposition: A | Discharge status: Home Health | Payer: No Typology Code available for payment source | Attending: Internal Medicine | Admitting: Internal Medicine

## 2019-09-04 ENCOUNTER — Encounter (HOSPITAL_COMMUNITY): Payer: Self-pay | Admitting: *Deleted

## 2019-09-04 ENCOUNTER — Emergency Department (HOSPITAL_COMMUNITY): Payer: No Typology Code available for payment source

## 2019-09-04 ENCOUNTER — Other Ambulatory Visit: Payer: Self-pay

## 2019-09-04 DIAGNOSIS — G629 Polyneuropathy, unspecified: Secondary | ICD-10-CM | POA: Diagnosis present

## 2019-09-04 DIAGNOSIS — N179 Acute kidney failure, unspecified: Secondary | ICD-10-CM | POA: Diagnosis present

## 2019-09-04 DIAGNOSIS — Z20822 Contact with and (suspected) exposure to covid-19: Secondary | ICD-10-CM | POA: Diagnosis present

## 2019-09-04 DIAGNOSIS — K219 Gastro-esophageal reflux disease without esophagitis: Secondary | ICD-10-CM | POA: Diagnosis present

## 2019-09-04 DIAGNOSIS — R739 Hyperglycemia, unspecified: Secondary | ICD-10-CM | POA: Diagnosis present

## 2019-09-04 DIAGNOSIS — Z950 Presence of cardiac pacemaker: Secondary | ICD-10-CM | POA: Diagnosis not present

## 2019-09-04 DIAGNOSIS — R9089 Other abnormal findings on diagnostic imaging of central nervous system: Secondary | ICD-10-CM | POA: Diagnosis present

## 2019-09-04 DIAGNOSIS — D7589 Other specified diseases of blood and blood-forming organs: Secondary | ICD-10-CM | POA: Diagnosis present

## 2019-09-04 DIAGNOSIS — G936 Cerebral edema: Secondary | ICD-10-CM | POA: Diagnosis present

## 2019-09-04 DIAGNOSIS — Z6832 Body mass index (BMI) 32.0-32.9, adult: Secondary | ICD-10-CM

## 2019-09-04 DIAGNOSIS — F29 Unspecified psychosis not due to a substance or known physiological condition: Secondary | ICD-10-CM | POA: Diagnosis present

## 2019-09-04 DIAGNOSIS — F17220 Nicotine dependence, chewing tobacco, uncomplicated: Secondary | ICD-10-CM | POA: Diagnosis present

## 2019-09-04 DIAGNOSIS — J449 Chronic obstructive pulmonary disease, unspecified: Secondary | ICD-10-CM | POA: Diagnosis present

## 2019-09-04 DIAGNOSIS — C7931 Secondary malignant neoplasm of brain: Principal | ICD-10-CM | POA: Diagnosis present

## 2019-09-04 DIAGNOSIS — K209 Esophagitis, unspecified without bleeding: Secondary | ICD-10-CM | POA: Diagnosis not present

## 2019-09-04 DIAGNOSIS — I129 Hypertensive chronic kidney disease with stage 1 through stage 4 chronic kidney disease, or unspecified chronic kidney disease: Secondary | ICD-10-CM | POA: Diagnosis present

## 2019-09-04 DIAGNOSIS — C3490 Malignant neoplasm of unspecified part of unspecified bronchus or lung: Secondary | ICD-10-CM | POA: Diagnosis present

## 2019-09-04 DIAGNOSIS — N1831 Chronic kidney disease, stage 3a: Secondary | ICD-10-CM | POA: Diagnosis present

## 2019-09-04 DIAGNOSIS — I495 Sick sinus syndrome: Secondary | ICD-10-CM | POA: Diagnosis present

## 2019-09-04 DIAGNOSIS — R079 Chest pain, unspecified: Secondary | ICD-10-CM

## 2019-09-04 DIAGNOSIS — I10 Essential (primary) hypertension: Secondary | ICD-10-CM | POA: Diagnosis not present

## 2019-09-04 DIAGNOSIS — R441 Visual hallucinations: Secondary | ICD-10-CM | POA: Diagnosis present

## 2019-09-04 DIAGNOSIS — C3411 Malignant neoplasm of upper lobe, right bronchus or lung: Secondary | ICD-10-CM | POA: Diagnosis present

## 2019-09-04 DIAGNOSIS — I82402 Acute embolism and thrombosis of unspecified deep veins of left lower extremity: Secondary | ICD-10-CM | POA: Diagnosis not present

## 2019-09-04 DIAGNOSIS — E871 Hypo-osmolality and hyponatremia: Secondary | ICD-10-CM | POA: Diagnosis present

## 2019-09-04 DIAGNOSIS — E876 Hypokalemia: Secondary | ICD-10-CM | POA: Diagnosis present

## 2019-09-04 DIAGNOSIS — Z79899 Other long term (current) drug therapy: Secondary | ICD-10-CM

## 2019-09-04 DIAGNOSIS — D72829 Elevated white blood cell count, unspecified: Secondary | ICD-10-CM | POA: Diagnosis present

## 2019-09-04 DIAGNOSIS — K2100 Gastro-esophageal reflux disease with esophagitis, without bleeding: Secondary | ICD-10-CM | POA: Diagnosis present

## 2019-09-04 DIAGNOSIS — Z888 Allergy status to other drugs, medicaments and biological substances status: Secondary | ICD-10-CM

## 2019-09-04 DIAGNOSIS — F4310 Post-traumatic stress disorder, unspecified: Secondary | ICD-10-CM | POA: Diagnosis present

## 2019-09-04 DIAGNOSIS — E785 Hyperlipidemia, unspecified: Secondary | ICD-10-CM | POA: Diagnosis present

## 2019-09-04 DIAGNOSIS — F329 Major depressive disorder, single episode, unspecified: Secondary | ICD-10-CM | POA: Diagnosis present

## 2019-09-04 DIAGNOSIS — I82502 Chronic embolism and thrombosis of unspecified deep veins of left lower extremity: Secondary | ICD-10-CM | POA: Diagnosis present

## 2019-09-04 DIAGNOSIS — B370 Candidal stomatitis: Secondary | ICD-10-CM | POA: Diagnosis present

## 2019-09-04 DIAGNOSIS — R443 Hallucinations, unspecified: Secondary | ICD-10-CM

## 2019-09-04 DIAGNOSIS — E669 Obesity, unspecified: Secondary | ICD-10-CM | POA: Diagnosis present

## 2019-09-04 DIAGNOSIS — G9340 Encephalopathy, unspecified: Secondary | ICD-10-CM | POA: Diagnosis present

## 2019-09-04 DIAGNOSIS — I82409 Acute embolism and thrombosis of unspecified deep veins of unspecified lower extremity: Secondary | ICD-10-CM | POA: Diagnosis present

## 2019-09-04 DIAGNOSIS — G40A09 Absence epileptic syndrome, not intractable, without status epilepticus: Secondary | ICD-10-CM | POA: Diagnosis present

## 2019-09-04 DIAGNOSIS — T380X5A Adverse effect of glucocorticoids and synthetic analogues, initial encounter: Secondary | ICD-10-CM | POA: Diagnosis present

## 2019-09-04 DIAGNOSIS — Z7902 Long term (current) use of antithrombotics/antiplatelets: Secondary | ICD-10-CM

## 2019-09-04 DIAGNOSIS — F32A Depression, unspecified: Secondary | ICD-10-CM | POA: Diagnosis present

## 2019-09-04 DIAGNOSIS — G4733 Obstructive sleep apnea (adult) (pediatric): Secondary | ICD-10-CM | POA: Diagnosis present

## 2019-09-04 DIAGNOSIS — F060 Psychotic disorder with hallucinations due to known physiological condition: Secondary | ICD-10-CM | POA: Diagnosis present

## 2019-09-04 DIAGNOSIS — Z801 Family history of malignant neoplasm of trachea, bronchus and lung: Secondary | ICD-10-CM

## 2019-09-04 DIAGNOSIS — R7401 Elevation of levels of liver transaminase levels: Secondary | ICD-10-CM | POA: Diagnosis present

## 2019-09-04 DIAGNOSIS — E860 Dehydration: Secondary | ICD-10-CM | POA: Diagnosis present

## 2019-09-04 DIAGNOSIS — Z7901 Long term (current) use of anticoagulants: Secondary | ICD-10-CM

## 2019-09-04 DIAGNOSIS — F320 Major depressive disorder, single episode, mild: Secondary | ICD-10-CM | POA: Diagnosis not present

## 2019-09-04 DIAGNOSIS — D696 Thrombocytopenia, unspecified: Secondary | ICD-10-CM | POA: Diagnosis present

## 2019-09-04 LAB — VITAMIN B12: Vitamin B-12: 479 pg/mL (ref 180–914)

## 2019-09-04 LAB — COMPREHENSIVE METABOLIC PANEL
ALT: 26 U/L (ref 0–44)
AST: 63 U/L — ABNORMAL HIGH (ref 15–41)
Albumin: 3.5 g/dL (ref 3.5–5.0)
Alkaline Phosphatase: 85 U/L (ref 38–126)
Anion gap: 15 (ref 5–15)
BUN: 15 mg/dL (ref 8–23)
CO2: 25 mmol/L (ref 22–32)
Calcium: 9.2 mg/dL (ref 8.9–10.3)
Chloride: 99 mmol/L (ref 98–111)
Creatinine, Ser: 2.12 mg/dL — ABNORMAL HIGH (ref 0.61–1.24)
GFR calc Af Amer: 38 mL/min — ABNORMAL LOW (ref >60)
GFR calc non Af Amer: 33 mL/min — ABNORMAL LOW (ref >60)
Glucose, Bld: 107 mg/dL — ABNORMAL HIGH (ref 70–99)
Potassium: 3.1 mmol/L — ABNORMAL LOW (ref 3.5–5.1)
Sodium: 139 mmol/L (ref 135–145)
Total Bilirubin: 1.1 mg/dL (ref 0.3–1.2)
Total Protein: 8.1 g/dL (ref 6.5–8.1)

## 2019-09-04 LAB — URINALYSIS, ROUTINE W REFLEX MICROSCOPIC
Bilirubin Urine: NEGATIVE
Glucose, UA: NEGATIVE mg/dL
Hgb urine dipstick: NEGATIVE
Ketones, ur: NEGATIVE mg/dL
Leukocytes,Ua: NEGATIVE
Nitrite: NEGATIVE
Protein, ur: 30 mg/dL — AB
Specific Gravity, Urine: 1.015 (ref 1.005–1.030)
pH: 7 (ref 5.0–8.0)

## 2019-09-04 LAB — CBC WITH DIFFERENTIAL/PLATELET
Abs Immature Granulocytes: 0.06 10*3/uL (ref 0.00–0.07)
Basophils Absolute: 0.1 10*3/uL (ref 0.0–0.1)
Basophils Relative: 1 %
Eosinophils Absolute: 1.2 10*3/uL — ABNORMAL HIGH (ref 0.0–0.5)
Eosinophils Relative: 9 %
HCT: 46.2 % (ref 39.0–52.0)
Hemoglobin: 15.7 g/dL (ref 13.0–17.0)
Immature Granulocytes: 0 %
Lymphocytes Relative: 14 %
Lymphs Abs: 1.9 10*3/uL (ref 0.7–4.0)
MCH: 35.2 pg — ABNORMAL HIGH (ref 26.0–34.0)
MCHC: 34 g/dL (ref 30.0–36.0)
MCV: 103.6 fL — ABNORMAL HIGH (ref 80.0–100.0)
Monocytes Absolute: 1.1 10*3/uL — ABNORMAL HIGH (ref 0.1–1.0)
Monocytes Relative: 8 %
Neutro Abs: 9.3 10*3/uL — ABNORMAL HIGH (ref 1.7–7.7)
Neutrophils Relative %: 68 %
Platelets: 142 10*3/uL — ABNORMAL LOW (ref 150–400)
RBC: 4.46 MIL/uL (ref 4.22–5.81)
RDW: 13.2 % (ref 11.5–15.5)
WBC: 13.7 10*3/uL — ABNORMAL HIGH (ref 4.0–10.5)
nRBC: 0 % (ref 0.0–0.2)

## 2019-09-04 LAB — TSH: TSH: 1.868 u[IU]/mL (ref 0.350–4.500)

## 2019-09-04 LAB — AMMONIA: Ammonia: 24 umol/L (ref 9–35)

## 2019-09-04 LAB — LIPASE, BLOOD: Lipase: 24 U/L (ref 11–51)

## 2019-09-04 LAB — PROTIME-INR
INR: 1.2 (ref 0.8–1.2)
Prothrombin Time: 14.5 seconds (ref 11.4–15.2)

## 2019-09-04 LAB — TROPONIN I (HIGH SENSITIVITY)
Troponin I (High Sensitivity): 29 ng/L — ABNORMAL HIGH (ref <18)
Troponin I (High Sensitivity): 30 ng/L — ABNORMAL HIGH (ref <18)

## 2019-09-04 LAB — T4, FREE: Free T4: 0.85 ng/dL (ref 0.61–1.12)

## 2019-09-04 LAB — APTT: aPTT: 36 seconds (ref 24–36)

## 2019-09-04 LAB — MAGNESIUM: Magnesium: 1.3 mg/dL — ABNORMAL LOW (ref 1.7–2.4)

## 2019-09-04 MED ORDER — FOLIC ACID 1 MG PO TABS
1.0000 mg | ORAL_TABLET | Freq: Every day | ORAL | Status: DC
  Administered 2019-09-05 – 2019-09-16 (×12): 1 mg via ORAL
  Filled 2019-09-04 (×12): qty 1

## 2019-09-04 MED ORDER — SUCRALFATE 1 GM/10ML PO SUSP
1.0000 g | Freq: Three times a day (TID) | ORAL | Status: DC
  Administered 2019-09-04 – 2019-09-16 (×45): 1 g via ORAL
  Filled 2019-09-04 (×50): qty 10

## 2019-09-04 MED ORDER — LACTATED RINGERS IV SOLN: INTRAVENOUS | Status: DC

## 2019-09-04 MED ORDER — LACTATED RINGERS IV BOLUS
1000.0000 mL | Freq: Once | INTRAVENOUS | Status: AC
  Administered 2019-09-04: 1000 mL via INTRAVENOUS

## 2019-09-04 MED ORDER — NYSTATIN 100000 UNIT/ML MT SUSP
5.0000 mL | Freq: Four times a day (QID) | OROMUCOSAL | Status: DC
  Administered 2019-09-04 – 2019-09-16 (×45): 500000 [IU] via ORAL
  Filled 2019-09-04 (×47): qty 5

## 2019-09-04 MED ORDER — GABAPENTIN 100 MG PO CAPS
100.0000 mg | ORAL_CAPSULE | Freq: Every day | ORAL | Status: DC
  Administered 2019-09-04 – 2019-09-15 (×12): 100 mg via ORAL
  Filled 2019-09-04 (×12): qty 1

## 2019-09-04 MED ORDER — DEXAMETHASONE SODIUM PHOSPHATE 10 MG/ML IJ SOLN
8.0000 mg | Freq: Four times a day (QID) | INTRAMUSCULAR | Status: DC
  Administered 2019-09-05 – 2019-09-16 (×47): 8 mg via INTRAVENOUS
  Filled 2019-09-04 (×46): qty 1

## 2019-09-04 MED ORDER — PRO-STAT SUGAR FREE PO LIQD
30.0000 mL | Freq: Two times a day (BID) | ORAL | Status: DC
  Administered 2019-09-04 – 2019-09-16 (×24): 30 mL via ORAL
  Filled 2019-09-04 (×24): qty 30

## 2019-09-04 MED ORDER — SODIUM CHLORIDE 0.9% FLUSH
3.0000 mL | Freq: Two times a day (BID) | INTRAVENOUS | Status: DC
  Administered 2019-09-05 – 2019-09-15 (×21): 3 mL via INTRAVENOUS

## 2019-09-04 MED ORDER — MAGIC MOUTHWASH
5.0000 mL | Freq: Four times a day (QID) | ORAL | Status: DC
  Administered 2019-09-04 – 2019-09-11 (×25): 5 mL via ORAL
  Filled 2019-09-04 (×27): qty 5

## 2019-09-04 MED ORDER — DOCUSATE SODIUM 100 MG PO CAPS
100.0000 mg | ORAL_CAPSULE | Freq: Two times a day (BID) | ORAL | Status: DC
  Administered 2019-09-05 – 2019-09-16 (×12): 100 mg via ORAL
  Filled 2019-09-04 (×21): qty 1

## 2019-09-04 MED ORDER — LEVETIRACETAM IN NACL 500 MG/100ML IV SOLN
500.0000 mg | Freq: Two times a day (BID) | INTRAVENOUS | Status: DC
  Administered 2019-09-04 – 2019-09-10 (×12): 500 mg via INTRAVENOUS
  Filled 2019-09-04 (×13): qty 100

## 2019-09-04 MED ORDER — MAGNESIUM SULFATE 2 GM/50ML IV SOLN
2.0000 g | Freq: Once | INTRAVENOUS | Status: AC
  Administered 2019-09-04: 2 g via INTRAVENOUS
  Filled 2019-09-04: qty 50

## 2019-09-04 MED ORDER — PANTOPRAZOLE SODIUM 40 MG PO TBEC
40.0000 mg | DELAYED_RELEASE_TABLET | Freq: Two times a day (BID) | ORAL | Status: DC
  Administered 2019-09-05 – 2019-09-16 (×23): 40 mg via ORAL
  Filled 2019-09-04 (×23): qty 1

## 2019-09-04 MED ORDER — ONDANSETRON HCL 4 MG PO TABS: 4.0000 mg | ORAL_TABLET | Freq: Four times a day (QID) | ORAL | Status: DC | PRN

## 2019-09-04 MED ORDER — TRAMADOL HCL 50 MG PO TABS
50.0000 mg | ORAL_TABLET | Freq: Three times a day (TID) | ORAL | Status: DC | PRN
  Administered 2019-09-05 – 2019-09-16 (×12): 50 mg via ORAL
  Filled 2019-09-04 (×12): qty 1

## 2019-09-04 MED ORDER — ONDANSETRON HCL 4 MG/2ML IJ SOLN
4.0000 mg | INTRAMUSCULAR | Status: AC
  Administered 2019-09-04: 4 mg via INTRAVENOUS
  Filled 2019-09-04: qty 2

## 2019-09-04 MED ORDER — DEXAMETHASONE SODIUM PHOSPHATE 10 MG/ML IJ SOLN
10.0000 mg | Freq: Once | INTRAMUSCULAR | Status: AC
  Administered 2019-09-04: 10 mg via INTRAVENOUS
  Filled 2019-09-04: qty 1

## 2019-09-04 MED ORDER — POTASSIUM CHLORIDE CRYS ER 20 MEQ PO TBCR
40.0000 meq | EXTENDED_RELEASE_TABLET | Freq: Once | ORAL | Status: DC
  Filled 2019-09-04: qty 2

## 2019-09-04 MED ORDER — FAMOTIDINE 20 MG PO TABS
20.0000 mg | ORAL_TABLET | Freq: Every day | ORAL | Status: DC
  Administered 2019-09-04 – 2019-09-16 (×13): 20 mg via ORAL
  Filled 2019-09-04 (×13): qty 1

## 2019-09-04 MED ORDER — ONDANSETRON HCL 4 MG/2ML IJ SOLN
4.0000 mg | Freq: Four times a day (QID) | INTRAMUSCULAR | Status: DC | PRN
  Administered 2019-09-06: 4 mg via INTRAVENOUS
  Filled 2019-09-04: qty 2

## 2019-09-04 NOTE — Progress Notes (Signed)
EEG complete - results pending 

## 2019-09-04 NOTE — ED Notes (Signed)
Pacemaker interrogated. 

## 2019-09-04 NOTE — ED Notes (Signed)
RN pulled oral potassium, pt is currently  Receiving EEG, RN will inform primary RN of the need of oral potassium still, seizure pads placed on pt stretcher. Pt in NAD at this time , attached to cardiac monitor, bp cuff and pulse ox

## 2019-09-04 NOTE — ED Notes (Signed)
Dr. Rex Kras at bedside.

## 2019-09-04 NOTE — ED Notes (Signed)
Pt found standing near door in room, trying to pick something off the floor. Pt was saying there was a dime on the floor, there wasn't. Pt was also saying there were bugs on the bed, pointed to a piece of lint on the sheet, blanket, and his clothing. There were no bugs present. Pt made high fall risk, bed alarm placed on bed, yellow socks applied, and yellow band applied.

## 2019-09-04 NOTE — ED Notes (Signed)
Pt unable to urinate at this time. While trying to urinate pt made a comment about the "ants or maybe baby cockroaches" climbing on his legs. No bugs were present.

## 2019-09-04 NOTE — ED Notes (Signed)
Transported to CT 

## 2019-09-04 NOTE — H&P (Signed)
[facility] History and Physical   Patient: Daniel Bryant FYB:[id number]   PCP: [facility] DOB: [birth year]   DOA: 09/04/2019   DOS: 09/04/2019   DOS: the patient was seen and examined on 09/04/2019  Patient coming from: The patient is coming from Home  Chief Complaint: Chest pain  HPI: Daniel Bryant is a 59 y.o. male with Past medical history of stage IV non-small cell lung cancer/adenocarcinoma follows up with Dr. Julien Nordmann currently on carboplatin Alimta, last dose June 06, 2019, no-show for April and May appointments, drug-induced rash secondary to Alimta, HTN, GERD, neuropathy, depression, hyperlipidemia. Patient presents with complaints of chest pain.  Reportedly chest pain began on the night of 09/03/2019 associated with headache shortness of breath nausea and upset stomach.  He also had vomiting episode at home.  EMS was called due to chest pain.  Patient lives alone but reportedly has home health who comes home. On further evaluation patient tells me that he may have a fall, found himself on the floor 1 day, he also has tongue bite but he does not know when it occurred. He denies having any focal deficit right now.  He reports dizziness but no vertigo.  On exam he has blurry vision but denies any history of the same at home. No loss of control of bowel or bladder. Reportedly he is compliant with his Lovenox injections. No diarrhea no constipation reported.  No bleeding reported by the patient. Currently does not have any chest pain. Reports forgetfulness. Does not have any family member to help him assist with decision-making process.  Does not have any HCPOA.  Denies any alcohol abuse or drug abuse.  No smoking. Patient has been seeing bugs crawling all over the room.  He also saw bubbles moving on his arm during examination.  ED Course: Presents with complaints of chest pain.  Initial troponin EKG unremarkable.  Found to have AKI.  Chest x-ray unremarkable as  well.  CT without contrast was performed which was negative for any significant acute abnormality. Patient had significant confusion as well as hallucination.  Patient was seeing bugs crawling all over.  Therefore CT head was performed which showed metastatic lesions with midline shift.  Neurosurgery was consulted they will evaluate the patient in the morning.  Patient was referred for admission.  At his baseline ambulates without assistance independent for most of his ADL;  manages his medication on his own.  Review of Systems: as mentioned in the history of present illness.  All other systems reviewed and are negative.  Past Medical History:  Diagnosis Date  . Asthma   . Cancer ([place])   . COPD (chronic obstructive pulmonary disease) ([place])   . GERD (gastroesophageal reflux disease)   . High cholesterol   . History of petit-mal seizures   . Hyperlipidemia   . Hypertension   . Memory impairment   . OSA (obstructive sleep apnea)    Does not tolerate CPAP  . Pacemaker   . PTSD (post-traumatic stress disorder)   . Sick sinus syndrome [facility])    Past Surgical History:  Procedure Laterality Date  . CARDIAC SURGERY    . CHEST TUBE INSERTION Right 05/16/2018   Procedure: INSERTION PLEURAL DRAINAGE CATHETER;  Surgeon: Ivin Poot, MD;  Location: [place];  Service: Thoracic;  Laterality: Right;  . CHEST TUBE INSERTION Right 05/16/2018   Procedure: Chest Tube Insertion;  Surgeon: Ivin Poot, MD;  Location: [place];  Service: Thoracic;  Laterality: Right;  .  LOWER EXTREMITY VENOGRAPHY Left 05/20/2019   Procedure: LOWER EXTREMITY VENOGRAPHY/POSSIBLE THROMBECTOMY;  Surgeon: Waynetta Sandy, MD;  Location: [facility];  Service: Cardiovascular;  Laterality: Left;  . PACEMAKER INSERTION    . PERIPHERAL VASCULAR BALLOON ANGIOPLASTY Left 05/20/2019   Procedure: PERIPHERAL VASCULAR BALLOON ANGIOPLASTY;  Surgeon: Waynetta Sandy, MD;  Location: [place];  Service:  Cardiovascular;  Laterality: Left;  Femoral, common femoral, exterrnal iliac, and common illiac  . REMOVAL OF PLEURAL DRAINAGE CATHETER Right 08/30/2018   Procedure: REMOVAL OF PLEURAL DRAINAGE CATHETER;  Surgeon: Ivin Poot, MD;  Location: [facility];  Service: Thoracic;  Laterality: Right;   Social History:  reports that he has quit smoking. His smoking use included e-cigarettes. His smokeless tobacco use includes chew. He reports previous alcohol use. He reports that he does not use drugs.  Allergies  Allergen Reactions  . Other Other (See Comments)    Patient states "I had a scrotal procedure a couple of years ago, there was something they gave me to relax me and I started freaking out and seeing things".  Unable to ascertain from pt or his records what medication was.   Family history reviewed and not pertinent Family History  Problem Relation Age of Onset  . Alzheimer's disease Mother   . Cancer Mother   . Memory loss Father      Prior to Admission medications   Medication Sig Start Date End Date Taking? Authorizing Provider  albuterol (PROVENTIL) (2.5 MG/3ML) 0.083% nebulizer solution Take 3 mLs (2.5 mg total) by nebulization 2 (two) times daily as needed for wheezing or shortness of breath. 04/09/18   Kayleen Memos, DO  Amino Acids-Protein Hydrolys (FEEDING SUPPLEMENT, PRO-STAT SUGAR FREE 64,) LIQD Take 30 mLs by mouth 2 (two) times daily. 11/12/18   Lavina Hamman, MD  atorvastatin (LIPITOR) 40 MG tablet Take 40 mg by mouth daily.     [provider]  busPIRone (BUSPAR) 10 MG tablet Take 5 mg by mouth 2 (two) times daily.    [provider]  enoxaparin (LOVENOX) 100 MG/ML injection Inject 1.5 mLs (150 mg total) into the skin daily. 05/21/19 08/19/19  Marianna Payment, MD  escitalopram (LEXAPRO) 20 MG tablet Take 20 mg by mouth daily.     [provider]  famotidine (PEPCID) 20 MG tablet Take 1 tablet (20 mg total) by mouth daily. 11/12/18   Lavina Hamman, MD    folic acid (FOLVITE) 1 MG tablet Take 1 tablet (1 mg total) by mouth daily. 04/12/19   Heilingoetter, Cassandra L, PA-C  gabapentin (NEURONTIN) 100 MG capsule Take 100 mg by mouth at bedtime.    [provider]  lisinopril (ZESTRIL) 5 MG tablet Take 5 mg by mouth daily.    [provider]  magnesium oxide (MAG-OX) 400 (241.3 Mg) MG tablet Take 1 tablet (400 mg total) by mouth in the morning, at noon, and at bedtime. 05/30/19   Heilingoetter, Cassandra L, PA-C  Multiple Vitamin (MULTIVITAMIN WITH MINERALS) TABS tablet Take 1 tablet by mouth daily. 11/13/18   Lavina Hamman, MD  Potassium Chloride ER 20 MEQ TBCR Take 20 mEq by mouth daily. 06/19/19   Heilingoetter, Cassandra L, PA-C  predniSONE (DELTASONE) 10 MG tablet 6 tab x 1 day, 5 tab x 1 day, 4 tab x 1 day, 3 tab x 1 day, 2 tab x 1 day, 1 tab x 1 day, 1/2 tab  X 2 days 06/19/19   Tanner,  Lyndon Code., PA-C  prochlorperazine (COMPAZINE) 10 MG tablet Take 1 tablet (10 mg total) by mouth every 6 (six) hours as needed for nausea or vomiting. Patient not taking: Reported on 12/31/2018 07/24/18   Curt Bears, MD    Physical Exam: Vitals:   09/04/19 1233 09/04/19 1418 09/04/19 1430 09/04/19 1518  BP:   118/76   Pulse: 95  87 90  Resp:  (!) 22 (!) 24 18  Temp:      TempSrc:      SpO2: 96%  95%   Weight:      Height:        General: alert and oriented to time, place, and person. Appear in moderate distress, affect flat in affect Eyes: PERRL, Conjunctiva normal ENT: Oral Mucosa moist, bilateral tongue bite, no erythema oropharynx Neck: no JVD, no Abnormal Mass Or lumps Cardiovascular: S1 and S2 Present, no Murmur, peripheral pulses symmetrical Respiratory: good respiratory effort, Bilateral Air entry equal and Decreased, no signs of accessory muscle use, faint basal crackles, no wheezes Abdomen: Bowel Sound present, Soft and no tenderness, no hernia Skin: no rashes  Extremities: no Pedal edema, no calf tenderness Neurologic:  mental status, alert and oriented x3, speech normal, PERLA, Motor strength 5/5 and symmetric, Sensation grossly normal to light touch, Biceps and Knee reflexes normal and symmetric, Babinski response equivocal and No asterixis present. Gait not checked due to patient safety concerns  Data Reviewed: I have personally reviewed and interpreted labs, imaging as discussed below.  CBC: Recent Labs  Lab 09/04/19 1200  WBC 13.7*  NEUTROABS 9.3*  HGB 15.7  HCT 46.2  MCV 103.6*  PLT 626*   Basic Metabolic Panel: Recent Labs  Lab 09/04/19 1200  NA 139  K 3.1*  CL 99  CO2 25  GLUCOSE 107*  BUN 15  CREATININE 2.12*  CALCIUM 9.2   GFR: Estimated Creatinine Clearance: 45.1 mL/min (A) (by C-G formula based on SCr of 2.12 mg/dL (H)). Liver Function Tests: Recent Labs  Lab 09/04/19 1200  AST 63*  ALT 26  ALKPHOS 85  BILITOT 1.1  PROT 8.1  ALBUMIN 3.5   Recent Labs  Lab 09/04/19 1200  LIPASE 24   No results for input(s): AMMONIA in the last 168 hours. Coagulation Profile: No results for input(s): INR, PROTIME in the last 168 hours. Cardiac Enzymes: No results for input(s): CKTOTAL, CKMB, CKMBINDEX, TROPONINI in the last 168 hours. BNP (last 3 results) No results for input(s): PROBNP in the last 8760 hours. HbA1C: No results for input(s): HGBA1C in the last 72 hours. CBG: No results for input(s): GLUCAP in the last 168 hours. Lipid Profile: No results for input(s): CHOL, HDL, LDLCALC, TRIG, CHOLHDL, LDLDIRECT in the last 72 hours. Thyroid Function Tests: No results for input(s): TSH, T4TOTAL, FREET4, T3FREE, THYROIDAB in the last 72 hours. Anemia Panel: No results for input(s): VITAMINB12, FOLATE, FERRITIN, TIBC, IRON, RETICCTPCT in the last 72 hours. Urine analysis:    Component Value Date/Time   COLORURINE YELLOW 08/08/2018 0509   APPEARANCEUR HAZY (A) 08/08/2018 0509   LABSPEC 1.025 08/08/2018 0509   PHURINE 5.0 08/08/2018 0509   GLUCOSEU NEGATIVE 08/08/2018  0509   HGBUR SMALL (A) 08/08/2018 0509   BILIRUBINUR NEGATIVE 08/08/2018 0509   KETONESUR NEGATIVE 08/08/2018 0509   PROTEINUR NEGATIVE 08/08/2018 0509   NITRITE NEGATIVE 08/08/2018 0509   LEUKOCYTESUR NEGATIVE 08/08/2018 0509    Radiological Exams on Admission: DG Chest 2 View  Result Date: 09/04/2019 CLINICAL DATA:  Chest pain since  last night. Additional history obtained from prior radiology records: Stage IV lung cancer. EXAM: CHEST - 2 VIEW COMPARISON:  Chest CT 04/04/2019, chest radiograph 11/05/2018 FINDINGS: Redemonstrated right chest dual lead pacer device. Heart size within normal limits. There is ill-defined and nodular opacities within the mid to lower right lung field, as well as right pleural thickening, consistent with metastatic disease within the right lung and right pleural space demonstrated on prior chest CT 04/03/2018. Superimposed infection at this site is difficult to exclude. The left lung is grossly clear. No left-sided pleural effusion. No evidence of pneumothorax. No acute bony abnormality identified. IMPRESSION: Ill-defined and nodular opacities within the mid to lower right lung field, as well as right pleural thickening, consistent with metastatic disease within the right lung and right pleural space demonstrated on prior chest CT 04/03/2018. Superimposed infection at this site is difficult to exclude radiographically. The left lung remains grossly clear. Electronically Signed   By: Kellie Simmering DO   On: 09/04/2019 13:21   CT Head Wo Contrast  Result Date: 09/04/2019 CLINICAL DATA:  Altered mental status. EXAM: CT HEAD WITHOUT CONTRAST TECHNIQUE: Contiguous axial images were obtained from the base of the skull through the vertex without intravenous contrast. COMPARISON:  May 01, 2018 FINDINGS: Brain: There is mild cerebral atrophy with widening of the extra-axial spaces and ventricular dilatation. There are areas of decreased attenuation within the white matter  tracts of the supratentorial brain, consistent with microvascular disease changes. A 1.9 cm x 1.6 cm lobulated well-defined low-attenuation lesion, with thin surrounding hyperdense rim, is seen within the right parietal lobe. This represents a new finding when compared to the prior study. A moderate to marked amount of surrounding white matter low attenuation is seen within the right parietal and right temporal lobes, consistent with vasogenic edema. A mild amount of mass effect is seen on the adjacent sulci. Approximately 2.37 mm right to left midline shift is seen. Small to moderate sized areas of white matter low attenuation are seen within the right frontal, right posterior parietal and left occipital lobes. These are new when compared to the prior study. Vascular: No hyperdense vessels are identified. Skull: Normal. Negative for fracture or focal lesion. Sinuses/Orbits: Multiple lobulated bilateral maxillary sinus polyps versus mucous retention cysts are seen. Other: None. IMPRESSION: 1. 1.9 cm x 1.6 cm lobulated well-defined low-attenuation lesion, with thin surrounding hyperdense rim, within the right parietal lobe, with a moderate to marked amount of surrounding vasogenic edema. This represents a new finding when compared to the prior study and is worrisome for the presence of an underlying neoplasm. MRI correlation is recommended. 2. Small to moderate sized areas of white matter low attenuation are seen within the right frontal, right posterior parietal and left occipital lobes. These are new when compared to the prior study and are concerning for additional areas of vasogenic edema versus white matter ischemia. MRI correlation is recommended. 3. Approximately 2.37 mm right to left midline shift. 4. Multiple lobulated bilateral maxillary sinus polyps versus mucous retention cysts. Electronically Signed   By: Virgina Norfolk M.D.   On: 09/04/2019 17:51   CT Chest Wo Contrast  Result Date:  09/04/2019 CLINICAL DATA:  59 year old male with chest pain and shortness of breath. Metastatic lung cancer. EXAM: CT CHEST WITHOUT CONTRAST TECHNIQUE: Multidetector CT imaging of the chest was performed following the standard protocol without IV contrast. COMPARISON:  Chest CT dated 04/04/2019. FINDINGS: Evaluation of this exam is limited in the absence of intravenous contrast.  Cardiovascular: There is no cardiomegaly or pericardial effusion. Right pectoral dual lead pacemaker device. The thoracic aorta and central pulmonary arteries are grossly unremarkable. Coronary vascular calcifications of the LAD. Mediastinum/Nodes: Similar or minimally increased right hilar and mediastinal adenopathy. For example a right hilar lymph node measuring 19 mm previously measured 17 mm and a lymph node anterior to the trachea measuring 19 mm in short axis previously measured 15 mm. There is circumferential thickening of the distal esophagus which may represent esophagitis or infiltration of the esophagus by malignancy. No mediastinal fluid collection. Lungs/Pleura: Diffusely thickened and lobular right pleural consistent with metastatic disease. Overall similar or slight increased in the pleural thickening since the prior CT. There is a 6.6 x 5.0 cm (previously 6.1 x 4.7 cm) ovoid mass at the right cardiophrenic angle. There is probable trace right pleural effusion. There is diffuse interstitial and interlobular septal prominence of the right lung base which may represent edema or lymphangitic carcinomatosis. Several nodular densities at the right lung base similar to prior CT consistent with metastatic disease. The left lung is clear. No pneumothorax. The central airways are patent. Upper Abdomen: Fatty liver. A 2 cm hypodense lesion in the right lobe of the liver similar to prior CT is not characterized but demonstrates fluid attenuation, likely a cyst. Partially visualized 6 mm nodular density in the left upper abdomen (162/5).  Musculoskeletal: No acute osseous pathology. No obvious metastatic bone lesions. There is a 1 cm nodular density in the right lateral chest wall (139/5) similar to prior CT and most consistent with a metastatic disease or a mildly enlarged lymph node. IMPRESSION: 1. Similar or minimally progressed metastatic disease in the right lung, right pleural and hilar/mediastinal lymph node. 2. Circumferential thickening of the distal esophagus may represent esophagitis or infiltration of the esophagus by malignancy. 3. Fatty liver. 4. Partially visualized 6 mm nodular density in the left upper abdomen. 5. Aortic Atherosclerosis (ICD10-I70.0). Electronically Signed   By: Anner Crete M.D.   On: 09/04/2019 18:03   EKG: Independently reviewed. normal sinus rhythm, nonspecific ST and T waves changes, J wave present. Echocardiogram: January 2020 EF 60 to 01%, LVH, diastolic dysfunction  I reviewed all nursing notes, pharmacy notes, vitals, pertinent old records.  Assessment/Plan 1. Non-small cell lung cancer metastatic to brain [facility]) Acute encephalopathy secondary to metastasis. 2.3 mm midline shift. Visual hallucination. Suspected seizure with tongue bite Syncope Presents with chest pain.  Found to have visual hallucination and confusion on presentation.  Further work-up showed that the patient has metastatic brain lesion with 2.37 mm midline shift. Patient does not have any focal deficit right now other than forgetfulness and delayed recall. Exam reveals tongue bite and unresponsive event at home suspicious for seizures. Neurosurgery consulted for the patient. Currently holding Lovenox which patient takes for DVT prophylaxis given his brain lesions. MRI brain with and without contrast has been ordered currently unable to perform due to patient has a pacemaker and also AKI. Given IV Decadron 10 mg.  We will continue IV Decadron. Continue to monitor on telemetry. Seizure precautions. Aspiration  precautions. Serial neuro checks. PT OT consulted. IV Keppra 500 mg twice daily. EEG ordered. Metabolic work-up including ammonia, B12, TSH and free T4, folic acid ordered.  2.  AKI Hypokalemia. Hypomagnesemia. AKI likely from poor p.o. intake as well as vomiting and dehydration. Continue with IV LR. SP 2 L of LR bolus. Electrolytes currently being replaced. Monitor daily.  3.  Chest pain likely from GERD. Esophagitis. Presents  with chest pain. Serial troponins are negative. EKG unremarkable. CT scan of the chest also unremarkable for any other acute abnormality. Patient actually had recent diagnosis of DVT in March 2021 currently being on Lovenox although unsure of compliance due to his confusion. Currently no signs of PE on examination or DVT. Also anticoagulation contraindicated due to CNS lesion and midline shift. CT chest shows evidence of esophagitis which likely be contributing to patient's symptoms. Patient also had episodes of vomiting at home. Continue PPI, Carafate, Magic mouthwash.  4.  Recent DVT March 2021. Treated with thrombectomy.  Currently on therapeutic Lovenox at home. Reportedly compliant with this medicine. Currently discussed with neurosurgery, given CNS lesion and midline shift no anticoagulation.  Continue SCD.  5.  Macrocytosis. Leukocytosis. Check A21 folic acid and TSH for macrocytosis. No anemia. Leukocytosis likely stress reaction.  Continue to monitor. Check blood cultures.   6.  Essential hypertension. History of pacemaker implant  Unable to perform MRI tonight. Monitor.  7.  Elevated AST. Monitor daily LFTs for now.  9.  Depression. Reportedly patient is on Lexapro and BuSpar at home. Monitor for now.  10.  Goals of care conversation. Patient is highly forgetful right now. Patient was full code during last admission. Discussed with patient regarding his goal of care and at least he would like to be resuscitated in the event he  has a cardiac arrest. Also he would like to be treated for what is treatable for now. Asking regarding any close relative who can be his [facility] POA and he tells me that he has none. Explained poor prognosis and recommended to have a surrogate decision maker, he said he will ask around his few friends. We will consult palliative care.  Nutrition: Dysphagia type 3 thin Liquid DVT Prophylaxis: SCD, pharmacological prophylaxis contraindicated due to CNS lesion with midline shift  Advance goals of care discussion: Full code   Consults: I personally Discussed with neurosurgery. Patient will require oncology consult tomorrow morning.  Family Communication: no family was present at bedside, at the time of interview.   Disposition:  Status is: Inpatient  Remains inpatient appropriate because:Persistent severe electrolyte disturbances, Altered mental status, Ongoing diagnostic testing needed not appropriate for outpatient work up, Unsafe d/c plan, IV treatments appropriate due to intensity of illness or inability to take PO and Inpatient level of care appropriate due to severity of illness   Dispo: The patient is from: Home              Anticipated d/c is to: SNF              Anticipated d/c date is: > 3 days              Patient currently is not medically stable to d/c.   Author: Berle Mull, MD [facility] 09/04/2019 7:18 PM   To reach On-call, see care teams to locate the attending and reach out to them via [email or web address]. If 7PM-7AM, please contact night-coverage If you still have difficulty reaching the attending provider, please page the [facility] (Director on Call) for [facility] on amion for assistance.

## 2019-09-04 NOTE — ED Triage Notes (Addendum)
Pt here via [facility] from home for chest pain that began last night, headache.  Pt has not taken bp meds for several days.  Given 324 asa, 1 nitro and 4 zofran for nausea.  Chest pain completely relieved after nitro.  Pt was given lovenox shot by home healthcare rn this am to tx his DVT's and the RN make him take 5 mg lisinopril before EMS arrived.  Pt unable to answer orientation questions, but he does have hx of memory impairment.

## 2019-09-04 NOTE — ED Notes (Signed)
Patient transported to X-ray 

## 2019-09-04 NOTE — ED Provider Notes (Signed)
[facility] EMERGENCY DEPARTMENT Provider Note   CSN: [id number] Arrival date & time: 09/04/19  1113     History Chief Complaint  Patient presents with  . Chest Pain    Daniel Bryant is a 59 y.o. male.  59yo M w/ PMH including stage IV NSCLC, COPD, HTN, HLD, pacemaker, OSA, DVT, memory impairment who p/w chest pain. EMS reports they picked the patient up at home for chest pain that he reported began last night, associated w/ headache, SOB, nausea, and upset stomach feeling. He thinks he vomited once this morning at home. He hadn't taken BP meds for several days. He was given lovenox and lisinopril by his home health nurse this morning; EMS gave him 324mg  aspirin, 4mg  zofran, and NTG x 1. He lives alone, unable to clarify if home health comes every day or only a few times per week.  LEVEL 5 CAVEAT DUE TO MEMORY IMPAIRMENT  The history is provided by the EMS personnel and the patient.  Chest Pain      Past Medical History:  Diagnosis Date  . Asthma   . Cancer ([place])   . COPD (chronic obstructive pulmonary disease) ([place])   . GERD (gastroesophageal reflux disease)   . High cholesterol   . History of petit-mal seizures   . Hyperlipidemia   . Hypertension   . Memory impairment   . OSA (obstructive sleep apnea)    Does not tolerate CPAP  . Pacemaker   . PTSD (post-traumatic stress disorder)   . Sick sinus syndrome [facility])     Patient Active Problem List   Diagnosis Date Noted  . Dvt femoral (deep venous thrombosis) ([place]) 05/19/2019  . DVT (deep venous thrombosis) ([place]) 05/18/2019  . Diarrhea 11/06/2018  . Viral gastroenteritis 11/05/2018  . Orthostatic hypotension 11/05/2018  . Hypomagnesemia 11/05/2018  . AKI (acute kidney injury) ([place]) 11/05/2018  . Chemotherapy-induced neutropenia ([place])   . Neutropenic fever ([place]) 08/07/2018  . 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 Antineoplastic chemotherapy induced pancytopenia ([place]) 08/07/2018  . Hyponatremia  08/07/2018  . Drug-induced skin rash 07/24/2018  . Goals of care, counseling/discussion 06/14/2018  . Encounter for antineoplastic chemotherapy 06/14/2018  . Encounter for antineoplastic immunotherapy 06/14/2018  . Adenocarcinoma of right lung ([place]) 05/02/2018  . COPD (chronic obstructive pulmonary disease) ([place]) 05/02/2018  . Recurrent pleural effusion on right 05/01/2018  . Pacemaker 04/21/2018  . 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 Hypokalemia 04/05/2018  . High cholesterol 04/05/2018  . Hypertension 04/05/2018  . Polycythemia 04/05/2018    Past Surgical History:  Procedure Laterality Date  . CARDIAC SURGERY    . CHEST TUBE INSERTION Right 05/16/2018   Procedure: INSERTION PLEURAL DRAINAGE CATHETER;  Surgeon: Ivin Poot, MD;  Location: [facility];  Service: Thoracic;  Laterality: Right;  . CHEST TUBE INSERTION Right 05/16/2018   Procedure: Chest Tube Insertion;  Surgeon: Ivin Poot, MD;  Location: [facility];  Service: Thoracic;  Laterality: Right;  . LOWER EXTREMITY VENOGRAPHY Left 05/20/2019   Procedure: LOWER EXTREMITY VENOGRAPHY/POSSIBLE THROMBECTOMY;  Surgeon: Waynetta Sandy, MD;  Location: [facility] CV LAB;  Service: Cardiovascular;  Laterality: Left;  . PACEMAKER INSERTION    . PERIPHERAL VASCULAR BALLOON ANGIOPLASTY Left 05/20/2019   Procedure: PERIPHERAL VASCULAR BALLOON ANGIOPLASTY;  Surgeon: Waynetta Sandy, MD;  Location: [facility];  Service: Cardiovascular;  Laterality: Left;  Femoral, common femoral, exterrnal iliac, and common illiac  . REMOVAL OF PLEURAL DRAINAGE CATHETER Right 08/30/2018   Procedure: REMOVAL  OF PLEURAL DRAINAGE CATHETER;  Surgeon: Ivin Poot, MD;  Location: [facility];  Service: Thoracic;  Laterality: Right;       Family History  Problem Relation Age of Onset  . Alzheimer's disease Mother   . Cancer Mother   . Memory loss Father     Social History   Tobacco Use  . Smoking status: Former Smoker    Types:  E-cigarettes  . Smokeless tobacco: Current User    Types: Chew  Vaping Use  . Vaping Use: Every day  Substance Use Topics  . Alcohol use: Not Currently  . Drug use: No    Home Medications Prior to Admission medications   Medication Sig Start Date End Date Taking? Authorizing Provider  albuterol (PROVENTIL) (2.5 MG/3ML) 0.083% nebulizer solution Take 3 mLs (2.5 mg total) by nebulization 2 (two) times daily as needed for wheezing or shortness of breath. 04/09/18   Kayleen Memos, DO  Amino Acids-Protein Hydrolys (FEEDING SUPPLEMENT, PRO-STAT SUGAR FREE 64,) LIQD Take 30 mLs by mouth 2 (two) times daily. 11/12/18   Lavina Hamman, MD  atorvastatin (LIPITOR) 40 MG tablet Take 40 mg by mouth daily.     [provider]  busPIRone (BUSPAR) 10 MG tablet Take 5 mg by mouth 2 (two) times daily.    [provider]  enoxaparin (LOVENOX) 100 MG/ML injection Inject 1.5 mLs (150 mg total) into the skin daily. 05/21/19 08/19/19  Marianna Payment, MD  escitalopram (LEXAPRO) 20 MG tablet Take 20 mg by mouth daily.     [provider]  famotidine (PEPCID) 20 MG tablet Take 1 tablet (20 mg total) by mouth daily. 11/12/18   Lavina Hamman, MD  folic acid (FOLVITE) 1 MG tablet Take 1 tablet (1 mg total) by mouth daily. 04/12/19   Heilingoetter, Cassandra L, PA-C  gabapentin (NEURONTIN) 100 MG capsule Take 100 mg by mouth at bedtime.    [provider]  lisinopril (ZESTRIL) 5 MG tablet Take 5 mg by mouth daily.    [provider]  magnesium oxide (MAG-OX) 400 (241.3 Mg) MG tablet Take 1 tablet (400 mg total) by mouth in the morning, at noon, and at bedtime. 05/30/19   Heilingoetter, Cassandra L, PA-C  Multiple Vitamin (MULTIVITAMIN WITH MINERALS) TABS tablet Take 1 tablet by mouth daily. 11/13/18   Lavina Hamman, MD  Potassium Chloride ER 20 MEQ TBCR Take 20 mEq by mouth daily. 06/19/19   Heilingoetter, Cassandra L, PA-C  predniSONE (DELTASONE) 10 MG tablet 6 tab x 1 day, 5 tab x  1 day, 4 tab x 1 day, 3 tab x 1 day, 2 tab x 1 day, 1 tab x 1 day, 1/2 tab  X 2 days 06/19/19   Harle Stanford., PA-C  prochlorperazine (COMPAZINE) 10 MG tablet Take 1 tablet (10 mg total) by mouth every 6 (six) hours as needed for nausea or vomiting. Patient not taking: Reported on 12/31/2018 07/24/18   Curt Bears, MD    Allergies    Other  Review of Systems   Review of Systems  Unable to perform ROS: Dementia  Cardiovascular: Positive for chest pain.    Physical Exam Updated Vital Signs BP 118/76 (BP Location: Right Arm)   Pulse 87   Temp 99.3 F (37.4 C) (Oral)   Resp (!) 24   Ht 6' (1.829 m)   Wt 101.4 kg   SpO2 95%   BMI 30.32 kg/m   Physical Exam Vitals and nursing note reviewed.  Constitutional:      General: He is not in acute distress.    Appearance: He is well-developed.  HENT:     Head: Normocephalic and atraumatic.  Eyes:     Conjunctiva/sclera: Conjunctivae normal.     Pupils: Pupils are equal, round, and reactive to light.  Cardiovascular:     Rate and Rhythm: Normal rate and regular rhythm.     Heart sounds: Normal heart sounds. No murmur heard.   Pulmonary:     Effort: Pulmonary effort is normal.     Breath sounds: Normal breath sounds.  Abdominal:     General: Bowel sounds are normal. There is no distension.     Palpations: Abdomen is soft.     Tenderness: There is no abdominal tenderness.  Musculoskeletal:     Cervical back: Neck supple.     Right lower leg: No edema.     Left lower leg: No edema.  Skin:    General: Skin is warm and dry.  Neurological:     Mental Status: He is alert.     Comments: Fluent speech, gets confused easily during questioning  Psychiatric:        Mood and Affect: Mood normal.        Judgment: Judgment normal.     ED Results / Procedures / Treatments   Labs (all labs ordered are listed, but only abnormal results are displayed) Labs Reviewed  COMPREHENSIVE METABOLIC PANEL - Abnormal; Notable for the  following components:      Result Value   Potassium 3.1 (*)    Glucose, Bld 107 (*)    Creatinine, Ser 2.12 (*)    AST 63 (*)    GFR calc non Af Amer 33 (*)    GFR calc Af Amer 38 (*)    All other components within normal limits  CBC WITH DIFFERENTIAL/PLATELET - Abnormal; Notable for the following components:   WBC 13.7 (*)    MCV 103.6 (*)    MCH 35.2 (*)    Platelets 142 (*)    Neutro Abs 9.3 (*)    Monocytes Absolute 1.1 (*)    Eosinophils Absolute 1.2 (*)    All other components within normal limits  TROPONIN I (HIGH SENSITIVITY) - Abnormal; Notable for the following components:   Troponin I (High Sensitivity) 29 (*)    All other components within normal limits  SARS CORONAVIRUS 2 BY RT PCR (HOSPITAL ORDER, [facility])  LIPASE, BLOOD  URINALYSIS, ROUTINE W REFLEX MICROSCOPIC  TROPONIN I (HIGH SENSITIVITY)    EKG EKG Interpretation  Date/Time:  Wednesday September 04 2019 11:53:46 EDT Ventricular Rate:  102 PR Interval:    QRS Duration: 92 QT Interval:  349 QTC Calculation: 455 R Axis:   21 Text Interpretation: Sinus tachycardia Probable left atrial enlargement No significant change since last tracing Confirmed by Theotis Burrow [phone]) on 09/04/2019 12:01:18 PM   Radiology DG Chest 2 View  Result Date: 09/04/2019 CLINICAL DATA:  Chest pain since last night. Additional history obtained from prior radiology records: Stage IV lung cancer. EXAM: CHEST - 2 VIEW COMPARISON:  Chest CT 04/04/2019, chest radiograph 11/05/2018 FINDINGS: Redemonstrated right chest dual lead pacer device. Heart size within normal limits. There is ill-defined and nodular opacities within the mid to lower right lung field, as well as right pleural thickening, consistent with metastatic disease within the right lung and right pleural space demonstrated on prior chest CT 04/03/2018. Superimposed infection at this site is  difficult to exclude. The left lung is grossly clear. No  left-sided pleural effusion. No evidence of pneumothorax. No acute bony abnormality identified. IMPRESSION: Ill-defined and nodular opacities within the mid to lower right lung field, as well as right pleural thickening, consistent with metastatic disease within the right lung and right pleural space demonstrated on prior chest CT 04/03/2018. Superimposed infection at this site is difficult to exclude radiographically. The left lung remains grossly clear. Electronically Signed   By: Kellie Simmering DO   On: 09/04/2019 13:21    Procedures Procedures (including critical care time)  Medications Ordered in ED Medications  potassium chloride SA (KLOR-CON) CR tablet 40 mEq (has no administration in time range)  ondansetron (ZOFRAN) injection 4 mg (4 mg Intravenous Given 09/04/19 1208)  lactated ringers bolus 1,000 mL (1,000 mLs Intravenous New Bag/Given 09/04/19 1422)  lactated ringers bolus 1,000 mL (1,000 mLs Intravenous New Bag/Given 09/04/19 1421)    ED Course  I have reviewed the triage vital signs and the nursing notes.  Pertinent labs & imaging results that were available during my care of the patient were reviewed by me and considered in my medical decision making (see chart for details).    MDM Rules/Calculators/A&P                          Alert,comfortable on exam, borderline tachycardic. EKG without acute ischemic changes.  Lab work notable for AKI with creatinine 2.12, previous was around 1.  WBC 13.7, normal hemoglobin.  Initial Troponin 29 which is similar to previous values near 30.  Chest x-ray demonstrating his no metastatic disease, unclear whether he may have a superimposed infection.  Because of his current creatinine, unable to give contrast to obtain CTA to rule out PE, however I have ordered noncontrasted CT to evaluate for occult infection.  While patient has been here, he has been picking at things of his bed suggestive of mild hallucinations.  His vital signs are reassuring  against acute alcohol withdrawal and I see no documentation in his chart of previous history of alcohol abuse.  It is possible that this symptom is related to his known memory loss.  I am signing out to the oncoming provider.  Patient pending CT chest, repeat troponin. Final Clinical Impression(s) / ED Diagnoses Final diagnoses:  None    Rx / DC Orders ED Discharge Orders    None       Keylin Podolsky, Wenda Overland, MD 09/04/19 [phone]

## 2019-09-04 NOTE — Progress Notes (Signed)
Patient has a pacemaker but could not find any information in chart about what kind of pacemaker it is and if it is safe for MRI. RN aware we need more info before going forward with the exam.

## 2019-09-04 NOTE — ED Provider Notes (Signed)
  Provider Note MRN:  [id number]  Arrival date & time: 09/04/19    ED Course and Medical Decision Making  Assumed care from Dr. Rex Kras at shift change.  Known stage IV lung cancer here with chest pain, possibly new hallucinations though he has a history of neuropsychiatric disturbance.  Awaiting CT imaging.  Awaiting second troponin.  Patient has AKI and lives alone and is still seeing bugs crawling on the walls and floor, suspect need for admission.  CT imaging reveals metastatic brain lesions with vasogenic edema and minimal shift.  Discussed case with neurosurgery, providing IV Decadron, admitted to medicine for further care.  .Critical Care Performed by: Maudie Flakes, MD Authorized by: Maudie Flakes, MD   Critical care provider statement:    Critical care time (minutes):  35   Critical care was necessary to treat or prevent imminent or life-threatening deterioration of the following conditions: Metastatic brain lesions with vasogenic edema and midline shift.   Critical care was time spent personally by me on the following activities:  Discussions with consultants, evaluation of patient's response to treatment, examination of patient, ordering and performing treatments and interventions, ordering and review of laboratory studies, ordering and review of radiographic studies, pulse oximetry, re-evaluation of patient's condition, obtaining history from patient or surrogate and review of old charts    Final Clinical Impressions(s) / ED Diagnoses     ICD-10-CM   1. Chest pain, unspecified type  R07.9   2. AKI (acute kidney injury) ([place])  N17.9   3. Hallucinations  R44.3   4. Lung cancer metastatic to brain ([place])  C34.90    C79.31   5. Vasogenic brain edema [facility])  G93.6     ED Discharge Orders    None      Discharge Instructions   None     Daniel Bryant. [place] Small, [place] [email or web address]    Maudie Flakes, MD 09/04/19  2032

## 2019-09-05 ENCOUNTER — Inpatient Hospital Stay (HOSPITAL_COMMUNITY): Payer: No Typology Code available for payment source

## 2019-09-05 ENCOUNTER — Encounter (HOSPITAL_COMMUNITY): Payer: Self-pay | Admitting: Internal Medicine

## 2019-09-05 ENCOUNTER — Other Ambulatory Visit: Payer: Self-pay

## 2019-09-05 DIAGNOSIS — E669 Obesity, unspecified: Secondary | ICD-10-CM

## 2019-09-05 DIAGNOSIS — N179 Acute kidney failure, unspecified: Secondary | ICD-10-CM

## 2019-09-05 DIAGNOSIS — G9340 Encephalopathy, unspecified: Secondary | ICD-10-CM

## 2019-09-05 LAB — CBC
HCT: 40.6 % (ref 39.0–52.0)
Hemoglobin: 13.2 g/dL (ref 13.0–17.0)
MCH: 35.5 pg — ABNORMAL HIGH (ref 26.0–34.0)
MCHC: 32.5 g/dL (ref 30.0–36.0)
MCV: 109.1 fL — ABNORMAL HIGH (ref 80.0–100.0)
Platelets: 120 10*3/uL — ABNORMAL LOW (ref 150–400)
RBC: 3.72 MIL/uL — ABNORMAL LOW (ref 4.22–5.81)
RDW: 13 % (ref 11.5–15.5)
WBC: 7.6 10*3/uL (ref 4.0–10.5)
nRBC: 0 % (ref 0.0–0.2)

## 2019-09-05 LAB — COMPREHENSIVE METABOLIC PANEL
ALT: 24 U/L (ref 0–44)
AST: 48 U/L — ABNORMAL HIGH (ref 15–41)
Albumin: 3.1 g/dL — ABNORMAL LOW (ref 3.5–5.0)
Alkaline Phosphatase: 67 U/L (ref 38–126)
Anion gap: 11 (ref 5–15)
BUN: 18 mg/dL (ref 8–23)
CO2: 26 mmol/L (ref 22–32)
Calcium: 8.6 mg/dL — ABNORMAL LOW (ref 8.9–10.3)
Chloride: 101 mmol/L (ref 98–111)
Creatinine, Ser: 1.9 mg/dL — ABNORMAL HIGH (ref 0.61–1.24)
GFR calc Af Amer: 43 mL/min — ABNORMAL LOW (ref >60)
GFR calc non Af Amer: 37 mL/min — ABNORMAL LOW (ref >60)
Glucose, Bld: 136 mg/dL — ABNORMAL HIGH (ref 70–99)
Potassium: 4.1 mmol/L (ref 3.5–5.1)
Sodium: 138 mmol/L (ref 135–145)
Total Bilirubin: 1 mg/dL (ref 0.3–1.2)
Total Protein: 6.9 g/dL (ref 6.5–8.1)

## 2019-09-05 LAB — SARS CORONAVIRUS 2 BY RT PCR (HOSPITAL ORDER, PERFORMED IN ~~LOC~~ HOSPITAL LAB): SARS Coronavirus 2: NEGATIVE

## 2019-09-05 LAB — FOLATE: Folate: 37.8 ng/mL (ref >5.9)

## 2019-09-05 MED ORDER — IOHEXOL 350 MG/ML SOLN
60.0000 mL | Freq: Once | INTRAVENOUS | Status: AC | PRN
  Administered 2019-09-05: 60 mL via INTRAVENOUS

## 2019-09-05 NOTE — Procedures (Signed)
Patient Name: Daniel Bryant  MRN: [id number]  Epilepsy Attending: Lora Havens  Referring Physician/Provider: Dr Berle Mull Date: 09/05/2019 Duration: 26.16 mins  Patient history: 59 year old male presented to the ED last night after having hallucinations for a couple days. CT head revealed a 1.9x1.6cm lesion in the right parietal lobe with surrounding vasogenic edema.  EEG to evaluate for seizure  Level of alertness: Lethargic  AEDs during EEG study: LEV  Technical aspects: This EEG study was done with scalp electrodes positioned according to the 10-20 International system of electrode placement. Electrical activity was acquired at a sampling rate of 500Hz  and reviewed with a high frequency filter of 70Hz  and a low frequency filter of 1Hz . EEG data were recorded continuously and digitally stored.   Description: No clear posterior dominant rhythm was seen. Sharp waves were seen in left parieto-occipital region at times periodic at 0.25Hz . EEG showed continuous generalized 6-9Hz  theta-alpha activity as well as 3-5Hz  theta-delta slowing in left posterior quadrant.  Hyperventilation and photic stimulation were not performed.     ABNORMALITY - Sharp wave, left parieto- occipital region -Continuous slow, generalized and maximal left posterior quadrant  IMPRESSION: This study showed evidence of potential epileptogenicity as well as cortical dysfunction in left posterior quadrant secondary to underlying structural abnormality. Additionally, there is evidence of mild to moderate diffuse encephalopathy, non specific to etiology. No seizures were seen throughout the recording.  Sabatino Williard Barbra Sarks

## 2019-09-05 NOTE — ED Notes (Signed)
Kennyth Lose pate-family member- would like an update on pt status- [phone]

## 2019-09-05 NOTE — Consult Note (Signed)
Reason for Consult: brain mass Referring Physician: edp  Daniel Bryant is an 59 y.o. male.   HPI:  59 year old male presented to the ED last night after having strange hallucinations about bugs all over his house. He has a history of non small cell lung cancer that he has been getting chemotherapy for. His oncologist is Dr. Julien Nordmann. Apparently he has no showed to his last two appointments. He is quite confused today during our exam. He does live at home alone. It was reported that he bit his tongue at some point an seizure is in question. Denies any nausea vomiting or vision changes. Reports some mild headaches. History difficult to obtain because of confusion  Past Medical History:  Diagnosis Date  . Asthma   . Cancer ([place])   . COPD (chronic obstructive pulmonary disease) ([place])   . GERD (gastroesophageal reflux disease)   . High cholesterol   . History of petit-mal seizures   . Hyperlipidemia   . Hypertension   . Memory impairment   . OSA (obstructive sleep apnea)    Does not tolerate CPAP  . Pacemaker   . PTSD (post-traumatic stress disorder)   . Sick sinus syndrome [facility])     Past Surgical History:  Procedure Laterality Date  . CARDIAC SURGERY    . CHEST TUBE INSERTION Right 05/16/2018   Procedure: INSERTION PLEURAL DRAINAGE CATHETER;  Surgeon: Ivin Poot, MD;  Location: [place];  Service: Thoracic;  Laterality: Right;  . CHEST TUBE INSERTION Right 05/16/2018   Procedure: Chest Tube Insertion;  Surgeon: Ivin Poot, MD;  Location: [place];  Service: Thoracic;  Laterality: Right;  . LOWER EXTREMITY VENOGRAPHY Left 05/20/2019   Procedure: LOWER EXTREMITY VENOGRAPHY/POSSIBLE THROMBECTOMY;  Surgeon: Waynetta Sandy, MD;  Location: [place] [facility];  Service: Cardiovascular;  Laterality: Left;  . PACEMAKER INSERTION    . PERIPHERAL VASCULAR BALLOON ANGIOPLASTY Left 05/20/2019   Procedure: PERIPHERAL VASCULAR BALLOON ANGIOPLASTY;  Surgeon: Waynetta Sandy, MD;   Location: [place] [facility];  Service: Cardiovascular;  Laterality: Left;  Femoral, common femoral, exterrnal iliac, and common illiac  . REMOVAL OF PLEURAL DRAINAGE CATHETER Right 08/30/2018   Procedure: REMOVAL OF PLEURAL DRAINAGE CATHETER;  Surgeon: Ivin Poot, MD;  Location: [place];  Service: Thoracic;  Laterality: Right;    Allergies  Allergen Reactions  . Other Other (See Comments)    Patient states "I had a scrotal procedure a couple of years ago, there was something they gave me to relax me and I started freaking out and seeing things".  Unable to ascertain from pt or his records what medication was.    Social History   Tobacco Use  . Smoking status: Former Smoker    Types: E-cigarettes  . Smokeless tobacco: Current User    Types: Chew  Substance Use Topics  . Alcohol use: Not Currently    Family History  Problem Relation Age of Onset  . Alzheimer's disease Mother   . Cancer Mother   . Memory loss Father      Review of Systems  Positive ROS: as above  All other systems have been reviewed and were otherwise negative with the exception of those mentioned in the HPI and as above.  Objective: Vital signs in last 24 hours: Temp:  [99.3 F (37.4 C)] 99.3 F (37.4 C) (06/23 1125) Pulse Rate:  [60-109] 72 (06/24 0735) Resp:  [8-24] 18 (06/24 0735) BP: (93-159)/(65-90) 104/78 (06/24 0735) SpO2:  [92 %-  98 %] 98 % (06/24 0735) Weight:  [101.4 kg] 101.4 kg (06/23 1126)  General Appearance: Alert, cooperative, no distress, appears stated age Head: Normocephalic, without obvious abnormality, atraumatic Eyes: PERRL, conjunctiva/corneas clear, EOM's intact, fundi benign, both eyes      Lungs: respirations unlabored Heart: Regular rate and rhythm,  NEUROLOGIC:   Mental status: A&O x4, no aphasia, good attention span, Memory and fund of knowledge Motor Exam - grossly normal, normal tone and bulk Sensory Exam - grossly normal Reflexes: symmetric, no pathologic  reflexes, No Hoffman's, No clonus Coordination - grossly normal Gait - not tested Balance - not tested Cranial Nerves: I: smell Not tested  II: visual acuity  OS: na    OD: na  II: visual fields Full to confrontation  II: pupils Equal, round, reactive to light  III,VII: ptosis None  III,IV,VI: extraocular muscles  Full ROM  V: mastication Normal  V: facial light touch sensation  Normal  V,VII: corneal reflex  Present  VII: facial muscle function - upper  Normal  VII: facial muscle function - lower Normal  VIII: hearing Not tested  IX: soft palate elevation  Normal  IX,X: gag reflex Present  XI: trapezius strength  5/5  XI: sternocleidomastoid strength 5/5  XI: neck flexion strength  5/5  XII: tongue strength  Normal    Data Review Lab Results  Component Value Date   WBC 7.6 09/05/2019   HGB 13.2 09/05/2019   HCT 40.6 09/05/2019   MCV 109.1 (H) 09/05/2019   PLT 120 (L) 09/05/2019   Lab Results  Component Value Date   NA 138 09/05/2019   K 4.1 09/05/2019   CL 101 09/05/2019   CO2 26 09/05/2019   BUN 18 09/05/2019   CREATININE 1.90 (H) 09/05/2019   GLUCOSE 136 (H) 09/05/2019   Lab Results  Component Value Date   INR 1.2 09/04/2019    Radiology: DG Chest 2 View  Result Date: 09/04/2019 CLINICAL DATA:  Chest pain since last night. Additional history obtained from prior radiology records: Stage IV lung cancer. EXAM: CHEST - 2 VIEW COMPARISON:  Chest CT 04/04/2019, chest radiograph 11/05/2018 FINDINGS: Redemonstrated right chest dual lead pacer device. Heart size within normal limits. There is ill-defined and nodular opacities within the mid to lower right lung field, as well as right pleural thickening, consistent with metastatic disease within the right lung and right pleural space demonstrated on prior chest CT 04/03/2018. Superimposed infection at this site is difficult to exclude. The left lung is grossly clear. No left-sided pleural effusion. No evidence of  pneumothorax. No acute bony abnormality identified. IMPRESSION: Ill-defined and nodular opacities within the mid to lower right lung field, as well as right pleural thickening, consistent with metastatic disease within the right lung and right pleural space demonstrated on prior chest CT 04/03/2018. Superimposed infection at this site is difficult to exclude radiographically. The left lung remains grossly clear. Electronically Signed   By: Kellie Simmering DO   On: 09/04/2019 13:21   CT Head Wo Contrast  Result Date: 09/04/2019 CLINICAL DATA:  Altered mental status. EXAM: CT HEAD WITHOUT CONTRAST TECHNIQUE: Contiguous axial images were obtained from the base of the skull through the vertex without intravenous contrast. COMPARISON:  May 01, 2018 FINDINGS: Brain: There is mild cerebral atrophy with widening of the extra-axial spaces and ventricular dilatation. There are areas of decreased attenuation within the white matter tracts of the supratentorial brain, consistent with microvascular disease changes. A 1.9 cm x 1.6 cm  lobulated well-defined low-attenuation lesion, with thin surrounding hyperdense rim, is seen within the right parietal lobe. This represents a new finding when compared to the prior study. A moderate to marked amount of surrounding white matter low attenuation is seen within the right parietal and right temporal lobes, consistent with vasogenic edema. A mild amount of mass effect is seen on the adjacent sulci. Approximately 2.37 mm right to left midline shift is seen. Small to moderate sized areas of white matter low attenuation are seen within the right frontal, right posterior parietal and left occipital lobes. These are new when compared to the prior study. Vascular: No hyperdense vessels are identified. Skull: Normal. Negative for fracture or focal lesion. Sinuses/Orbits: Multiple lobulated bilateral maxillary sinus polyps versus mucous retention cysts are seen. Other: None. IMPRESSION: 1.  1.9 cm x 1.6 cm lobulated well-defined low-attenuation lesion, with thin surrounding hyperdense rim, within the right parietal lobe, with a moderate to marked amount of surrounding vasogenic edema. This represents a new finding when compared to the prior study and is worrisome for the presence of an underlying neoplasm. MRI correlation is recommended. 2. Small to moderate sized areas of white matter low attenuation are seen within the right frontal, right posterior parietal and left occipital lobes. These are new when compared to the prior study and are concerning for additional areas of vasogenic edema versus white matter ischemia. MRI correlation is recommended. 3. Approximately 2.37 mm right to left midline shift. 4. Multiple lobulated bilateral maxillary sinus polyps versus mucous retention cysts. Electronically Signed   By: Virgina Norfolk M.D.   On: 09/04/2019 17:51   CT Chest Wo Contrast  Result Date: 09/04/2019 CLINICAL DATA:  59 year old male with chest pain and shortness of breath. Metastatic lung cancer. EXAM: CT CHEST WITHOUT CONTRAST TECHNIQUE: Multidetector CT imaging of the chest was performed following the standard protocol without IV contrast. COMPARISON:  Chest CT dated 04/04/2019. FINDINGS: Evaluation of this exam is limited in the absence of intravenous contrast. Cardiovascular: There is no cardiomegaly or pericardial effusion. Right pectoral dual lead pacemaker device. The thoracic aorta and central pulmonary arteries are grossly unremarkable. Coronary vascular calcifications of the LAD. Mediastinum/Nodes: Similar or minimally increased right hilar and mediastinal adenopathy. For example a right hilar lymph node measuring 19 mm previously measured 17 mm and a lymph node anterior to the trachea measuring 19 mm in short axis previously measured 15 mm. There is circumferential thickening of the distal esophagus which may represent esophagitis or infiltration of the esophagus by malignancy. No  mediastinal fluid collection. Lungs/Pleura: Diffusely thickened and lobular right pleural consistent with metastatic disease. Overall similar or slight increased in the pleural thickening since the prior CT. There is a 6.6 x 5.0 cm (previously 6.1 x 4.7 cm) ovoid mass at the right cardiophrenic angle. There is probable trace right pleural effusion. There is diffuse interstitial and interlobular septal prominence of the right lung base which may represent edema or lymphangitic carcinomatosis. Several nodular densities at the right lung base similar to prior CT consistent with metastatic disease. The left lung is clear. No pneumothorax. The central airways are patent. Upper Abdomen: Fatty liver. A 2 cm hypodense lesion in the right lobe of the liver similar to prior CT is not characterized but demonstrates fluid attenuation, likely a cyst. Partially visualized 6 mm nodular density in the left upper abdomen (162/5). Musculoskeletal: No acute osseous pathology. No obvious metastatic bone lesions. There is a 1 cm nodular density in the right lateral chest wall (  139/5) similar to prior CT and most consistent with a metastatic disease or a mildly enlarged lymph node. IMPRESSION: 1. Similar or minimally progressed metastatic disease in the right lung, right pleural and hilar/mediastinal lymph node. 2. Circumferential thickening of the distal esophagus may represent esophagitis or infiltration of the esophagus by malignancy. 3. Fatty liver. 4. Partially visualized 6 mm nodular density in the left upper abdomen. 5. Aortic Atherosclerosis (ICD10-I70.0). Electronically Signed   By: Anner Crete M.D.   On: 09/04/2019 18:03   Assessment/Plan: 59 year old male presented to the ED last night after having hallucinations for a couple days. CT head revealed a 1.9x1.6cm lesion in the right parietal lobe with surrounding vasogenic edema.  Suspect lung metastasis. Awaiting MRI brain with and without. I do think he may have more  than one lesion on his MRI. This will likely be a SRS but will wait for MRI results. keppra and decadron.    Ocie Cornfield [facility] 09/05/2019 7:47 AM

## 2019-09-05 NOTE — Progress Notes (Signed)
PROGRESS NOTE  Daniel Bryant GDJ:[id number] DOB: [birth year] DOA: 09/04/2019 PCP: [facility]  HPI/Recap of past 61 hours: 59 year old male with past medical history of stage IV non-small cell lung cancer/adenocarcinoma currently on chemotherapy with last dose in March and then missed April and May appointments plus hypertension, depression and neuropathy presented to the emergency room on 6/23 complaining initially of chest pain associated with nausea and vomiting.  However, on examination, he was found to have acute kidney injury, tongue bites and acute confusion.  CT scan of the head noted metastatic lesions with midline shift, new finding for patient.  Patient started on Decadron and neurosurgery consulted.    Neurology consulted for EEG which was done which noted a sharp waves in the left parieto-occipital region, evidence of potential epileptogenic city as well as cortical dysfunction of the left posterior quadrant secondary to underlying structural abnormality plus evidence of mild to moderate diffuse encephalopathy.  Patient seen by neurosurgery with initial plans to get an MRI, but this needed to be canceled due to patient having a pacemaker.  CT scan of head with contrast is pending.  Patient himself doing well.  A little confused, but overall appropriate and interactive.  He was surprised to hear that he had missed his oncology appointments and had no recollection of this.  He lives alone.  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  On Decadron.  EEG as above noted.  Neurosurgery seeing.  Awaiting CT with contrast.  Was getting chemotherapy, but missed the last 2 sessions.  This is likely secondary to his brain mets and he is not able to care for himself at this point. Active Problems:   Hypokalemia:  Replacing as needed.    Hypertension: Blood pressure stable.  Continue home medications.    Pacemaker: Unable to get MRI.    COPD (chronic obstructive pulmonary disease) ([place]): Stable.  Not on oxygen.    AKI (acute kidney injury) ([place]) in the setting of chronic kidney disease, stage I-2: Likely secondary to seizure.  Hydrating.  Creatinine started to trend downward.    DVT (deep venous thrombosis) ([place]): Diagnosed 3 months ago.  Treated with thrombectomy and currently on therapeutic Lovenox at home.  In discussion with neurosurgery given his CNS lesion and midline shift, no anticoagulation currently.  On SCDs.    Leucocytosis: Likely stress margination    Macrocytosis: Incidentally noted.  No anemia.    Elevated SGOT (AST)   Chest pain due to GERD/esophagitis: No evidence of cardiac ischemia at this time.  Troponin stable.    Depression:: Continue home medications.    Obesity (BMI 30-39.9): Patient meets criteria with BMI greater than 30.   Code Status: Full code  Family Communication: No family.  Contacted friend who is on his contact list and left message.  Disposition Plan: Depending on neurosurgical findings, options may range from palliative care to skilled nursing.  Strongly doubt that he will not be able to go home by himself   Consultants:  Neurosurgery  Neurology  Procedures:  EEG done 6/23:noted a sharp waves in the left parieto-occipital region, evidence of potential epileptogenic city as well as cortical dysfunction of the left posterior quadrant secondary to underlying structural abnormality plus evidence of mild to moderate diffuse encephalopathy.  Antimicrobials:  None  DVT prophylaxis: SCDs   Objective: Vitals:   09/05/19 1158 09/05/19  1400  BP: 105/77 108/64  Pulse: 80 94  Resp: 17 (!) 21  Temp: 98.5 F (36.9 C)   SpO2: 100% 92%    Intake/Output Summary (Last 24 hours) at 09/05/2019 1622 Last data filed at 09/04/2019 1654 Gross per 24 hour   Intake 2000 ml  Output --  Net 2000 ml   Filed Weights   09/04/19 1126  Weight: 101.4 kg   Body mass index is 30.32 kg/m.  Exam:   General: Alert and oriented x2, intermittent episodes of confusion, no acute distress currently.  HEENT: Normocephalic and atraumatic, mucous membranes are slightly dry  Neck: Supple, no JVD  Cardiovascular: Regular rate and rhythm, S1-S2  Respiratory: Clear to auscultation bilaterally  Abdomen: Soft, nontender, nondistended, positive bowel sounds  Musculoskeletal: No clubbing or cyanosis or edema  Skin: No skin breaks, tears or lesions  Psychiatry: Appropriate,, but he is definitely confused.   Data Reviewed: CBC: Recent Labs  Lab 09/04/19 1200 09/05/19 0436  WBC 13.7* 7.6  NEUTROABS 9.3*  --   HGB 15.7 13.2  HCT 46.2 40.6  MCV 103.6* 109.1*  PLT 142* 222*   Basic Metabolic Panel: Recent Labs  Lab 09/04/19 1200 09/04/19 2005 09/05/19 0436  NA 139  --  138  K 3.1*  --  4.1  CL 99  --  101  CO2 25  --  26  GLUCOSE 107*  --  136*  BUN 15  --  18  CREATININE 2.12*  --  1.90*  CALCIUM 9.2  --  8.6*  MG  --  1.3*  --    GFR: Estimated Creatinine Clearance: 50.3 mL/min (A) (by C-G formula based on SCr of 1.9 mg/dL (H)). Liver Function Tests: Recent Labs  Lab 09/04/19 1200 09/05/19 0436  AST 63* 48*  ALT 26 24  ALKPHOS 85 67  BILITOT 1.1 1.0  PROT 8.1 6.9  ALBUMIN 3.5 3.1*   Recent Labs  Lab 09/04/19 1200  LIPASE 24   Recent Labs  Lab 09/04/19 1945  AMMONIA 24   Coagulation Profile: Recent Labs  Lab 09/04/19 2005  INR 1.2   Cardiac Enzymes: No results for input(s): CKTOTAL, CKMB, CKMBINDEX, TROPONINI in the last 168 hours. BNP (last 3 results) No results for input(s): PROBNP in the last 8760 hours. HbA1C: No results for input(s): HGBA1C in the last 72 hours. CBG: No results for input(s): GLUCAP in the last 168 hours. Lipid Profile: No results for input(s): CHOL, HDL, LDLCALC, TRIG, CHOLHDL,  LDLDIRECT in the last 72 hours. Thyroid Function Tests: Recent Labs    09/04/19 1944 09/04/19 2005  TSH 1.868  --   FREET4  --  0.85   Anemia Panel: Recent Labs    09/04/19 1944 09/04/19 2005  VITAMINB12  --  479  FOLATE 37.8  --    Urine analysis:    Component Value Date/Time   COLORURINE YELLOW 09/04/2019 1956   APPEARANCEUR CLEAR 09/04/2019 1956   LABSPEC 1.015 09/04/2019 1956   PHURINE 7.0 09/04/2019 1956   GLUCOSEU NEGATIVE 09/04/2019 1956   HGBUR NEGATIVE 09/04/2019 1956   BILIRUBINUR NEGATIVE 09/04/2019 [place] NEGATIVE 09/04/2019 1956   PROTEINUR 30 (A) 09/04/2019 1956   NITRITE NEGATIVE 09/04/2019 1956   LEUKOCYTESUR NEGATIVE 09/04/2019 1956   Sepsis Labs: @LABRCNTIP (procalcitonin:4,lacticidven:4)  ) Recent Results (from the past 240 hour(s))  SARS Coronavirus 2 by RT PCR (hospital order, performed in [place] hospital lab) Nasopharyngeal Nasopharyngeal Swab     Status: None  Collection Time: 09/04/19 10:04 PM   Specimen: Nasopharyngeal Swab  Result Value Ref Range Status   SARS Coronavirus 2 NEGATIVE NEGATIVE Final    Comment: (NOTE) SARS-CoV-2 target nucleic acids are NOT DETECTED.  The SARS-CoV-2 RNA is generally detectable in upper and lower respiratory specimens during the acute phase of infection. The lowest concentration of SARS-CoV-2 viral copies this assay can detect is 250 copies / mL. A negative result does not preclude SARS-CoV-2 infection and should not be used as the sole basis for treatment or other patient management decisions.  A negative result may occur with improper specimen collection / handling, submission of specimen other than nasopharyngeal swab, presence of viral mutation(s) within the areas targeted by this assay, and inadequate number of viral copies (<250 copies / mL). A negative result must be combined with clinical observations, patient history, and epidemiological information.  Fact Sheet for Patients:     StrictlyIdeas.no  Fact Sheet for Healthcare Providers: BankingDealers.co.za  This test is not yet approved or  cleared by the [place] FDA and has been authorized for detection and/or diagnosis of SARS-CoV-2 by FDA under an Emergency Use Authorization (EUA).  This EUA will remain in effect (meaning this test can be used) for the duration of the COVID-19 declaration under Section 564(b)(1) of the Act, 21 U.S.C. section 360bbb-3(b)(1), unless the authorization is terminated or revoked sooner.  Performed at [facility], [place] [address], [place], [place] [address]       Studies: CT Head Wo Contrast  Result Date: 09/04/2019 CLINICAL DATA:  Altered mental status. EXAM: CT HEAD WITHOUT CONTRAST TECHNIQUE: Contiguous axial images were obtained from the base of the skull through the vertex without intravenous contrast. COMPARISON:  May 01, 2018 FINDINGS: Brain: There is mild cerebral atrophy with widening of the extra-axial spaces and ventricular dilatation. There are areas of decreased attenuation within the white matter tracts of the supratentorial brain, consistent with microvascular disease changes. A 1.9 cm x 1.6 cm lobulated well-defined low-attenuation lesion, with thin surrounding hyperdense rim, is seen within the right parietal lobe. This represents a new finding when compared to the prior study. A moderate to marked amount of surrounding white matter low attenuation is seen within the right parietal and right temporal lobes, consistent with vasogenic edema. A mild amount of mass effect is seen on the adjacent sulci. Approximately 2.37 mm right to left midline shift is seen. Small to moderate sized areas of white matter low attenuation are seen within the right frontal, right posterior parietal and left occipital lobes. These are new when compared to the prior study. Vascular: No hyperdense vessels are identified. Skull: Normal.  Negative for fracture or focal lesion. Sinuses/Orbits: Multiple lobulated bilateral maxillary sinus polyps versus mucous retention cysts are seen. Other: None. IMPRESSION: 1. 1.9 cm x 1.6 cm lobulated well-defined low-attenuation lesion, with thin surrounding hyperdense rim, within the right parietal lobe, with a moderate to marked amount of surrounding vasogenic edema. This represents a new finding when compared to the prior study and is worrisome for the presence of an underlying neoplasm. MRI correlation is recommended. 2. Small to moderate sized areas of white matter low attenuation are seen within the right frontal, right posterior parietal and left occipital lobes. These are new when compared to the prior study and are concerning for additional areas of vasogenic edema versus white matter ischemia. MRI correlation is recommended. 3. Approximately 2.37 mm right to left midline shift. 4. Multiple lobulated bilateral maxillary sinus polyps  versus mucous retention cysts. Electronically Signed   By: Virgina Norfolk M.D.   On: 09/04/2019 17:51   CT Chest Wo Contrast  Result Date: 09/04/2019 CLINICAL DATA:  59 year old male with chest pain and shortness of breath. Metastatic lung cancer. EXAM: CT CHEST WITHOUT CONTRAST TECHNIQUE: Multidetector CT imaging of the chest was performed following the standard protocol without IV contrast. COMPARISON:  Chest CT dated 04/04/2019. FINDINGS: Evaluation of this exam is limited in the absence of intravenous contrast. Cardiovascular: There is no cardiomegaly or pericardial effusion. Right pectoral dual lead pacemaker device. The thoracic aorta and central pulmonary arteries are grossly unremarkable. Coronary vascular calcifications of the LAD. Mediastinum/Nodes: Similar or minimally increased right hilar and mediastinal adenopathy. For example a right hilar lymph node measuring 19 mm previously measured 17 mm and a lymph node anterior to the trachea measuring 19 mm in short  axis previously measured 15 mm. There is circumferential thickening of the distal esophagus which may represent esophagitis or infiltration of the esophagus by malignancy. No mediastinal fluid collection. Lungs/Pleura: Diffusely thickened and lobular right pleural consistent with metastatic disease. Overall similar or slight increased in the pleural thickening since the prior CT. There is a 6.6 x 5.0 cm (previously 6.1 x 4.7 cm) ovoid mass at the right cardiophrenic angle. There is probable trace right pleural effusion. There is diffuse interstitial and interlobular septal prominence of the right lung base which may represent edema or lymphangitic carcinomatosis. Several nodular densities at the right lung base similar to prior CT consistent with metastatic disease. The left lung is clear. No pneumothorax. The central airways are patent. Upper Abdomen: Fatty liver. A 2 cm hypodense lesion in the right lobe of the liver similar to prior CT is not characterized but demonstrates fluid attenuation, likely a cyst. Partially visualized 6 mm nodular density in the left upper abdomen (162/5). Musculoskeletal: No acute osseous pathology. No obvious metastatic bone lesions. There is a 1 cm nodular density in the right lateral chest wall (139/5) similar to prior CT and most consistent with a metastatic disease or a mildly enlarged lymph node. IMPRESSION: 1. Similar or minimally progressed metastatic disease in the right lung, right pleural and hilar/mediastinal lymph node. 2. Circumferential thickening of the distal esophagus may represent esophagitis or infiltration of the esophagus by malignancy. 3. Fatty liver. 4. Partially visualized 6 mm nodular density in the left upper abdomen. 5. Aortic Atherosclerosis (ICD10-I70.0). Electronically Signed   By: Anner Crete M.D.   On: 09/04/2019 18:03   EEG adult  Result Date: 09/05/2019 Lora Havens, MD     09/05/2019  2:11 PM Patient Name: Daniel Bryant MRN: [id number]  Epilepsy Attending: Lora Havens Referring Physician/Provider: Dr Berle Mull Date: 09/05/2019 Duration: 26.16 mins Patient history: 59 year old male presented to the ED last night after having hallucinations for a couple days. CT head revealed a 1.9x1.6cm lesion in the right parietal lobe with surrounding vasogenic edema.  EEG to evaluate for seizure Level of alertness: Lethargic AEDs during EEG study: LEV Technical aspects: This EEG study was done with scalp electrodes positioned according to the 10-20 International system of electrode placement. Electrical activity was acquired at a sampling rate of 500Hz  and reviewed with a high frequency filter of 70Hz  and a low frequency filter of 1Hz . EEG data were recorded continuously and digitally stored. Description: No clear posterior dominant rhythm was seen. Sharp waves were seen in left parieto-occipital region at times periodic at 0.25Hz . EEG showed continuous generalized 6-9Hz   theta-alpha activity as well as 3-5Hz  theta-delta slowing in left posterior quadrant.  Hyperventilation and photic stimulation were not performed.   ABNORMALITY - Sharp wave, left parieto- occipital region -Continuous slow, generalized and maximal left posterior quadrant IMPRESSION: This study showed evidence of potential epileptogenicity as well as cortical dysfunction in left posterior quadrant secondary to underlying structural abnormality. Additionally, there is evidence of mild to moderate diffuse encephalopathy, non specific to etiology. No seizures were seen throughout the recording. Priyanka Barbra Sarks    Scheduled Meds: . dexamethasone (DECADRON) injection  8 mg Intravenous Q6H  . docusate sodium  100 mg Oral BID  . famotidine  20 mg Oral Daily  . feeding supplement (PRO-STAT SUGAR FREE 64)  30 mL Oral BID  . folic acid  1 mg Oral Daily  . gabapentin  100 mg Oral QHS  . magic mouthwash  5 mL Oral QID  . nystatin  5 mL Oral QID  . pantoprazole  40 mg Oral BID AC  .  potassium chloride  40 mEq Oral Once  . sodium chloride flush  3 mL Intravenous Q12H  . sucralfate  1 g Oral TID WC & HS    Continuous Infusions: . lactated ringers 100 mL/hr at 09/04/19 2347  . levETIRAcetam Stopped (09/05/19 1033)     LOS: 1 day     Annita Brod, MD [facility]   09/05/2019, 4:22 PM

## 2019-09-05 NOTE — Progress Notes (Signed)
Called [employer], per Rep. This patient has an MR UNSAFE pacer and leads. Unable to perform MRI due to safety. RN aware. Ordering physician secure chat.

## 2019-09-05 NOTE — ED Notes (Signed)
Lunch Tray Ordered @ 1052.  

## 2019-09-05 NOTE — ED Notes (Signed)
[employer] Pacemaker-Serial Number: [id number]

## 2019-09-05 NOTE — ED Notes (Signed)
[id number] [phone]; unable to take report right now. Direct # given for a call back.

## 2019-09-05 NOTE — Progress Notes (Signed)
Pt is demonstrating a bit of confusion and is impulsive, able to walk with minor help but his safety is impacted by cognition.  Will need 24/7 help at home, physically not that much but will be unsafe as he is currently.  If this cannot be arranged will recommend he get SNF admit for short stay to get his situation figured out. May need to be in ALF vs with family around the clock. Follow acutely for gait and balance, safety reinforcement.  09/05/19 1300  PT Visit Information  Last PT Received On 09/05/19  Assistance Needed +1  History of Present Illness 59 yo male with onset of AMS and hallucinations of bugs in the house, missed recent appointments and general confusion was sent to ED.  Pt is complaining of tongue pain and states it was hurting and injured pre-hosp trip. Having HA's, confusion interfering with history.  Has esophageal mets, R parietal mets.  PMHx:  asthma, CA to lung, fatty liver, atherosclerosis, seizures, pacemaker, PTSD, SSS, OSA, HTN, COPD, chest tube, memory changes,   Precautions  Precautions Fall  Precaution Comments monitor vitals  Restrictions  Weight Bearing Restrictions No  Home Living  Family/patient expects to be discharged to: Private residence  Living Arrangements Alone  Type of [place] to enter  Entrance Stairs-Number of Steps 2+1  Entrance Stairs-Rails Left  Home Layout One level  [place] None  Additional Comments has been walking independently  Prior Function  Level of [place] Independent  Comments per pt did not need an AD  Communication  Communication No difficulties  Pain Assessment  Pain Assessment No/denies pain  Cognition  Arousal/Alertness Awake/alert  Behavior During Therapy Impulsive  Overall Cognitive Status No family/caregiver present to determine baseline cognitive functioning  General Comments unsure if the impulsivity is new  Upper Extremity Assessment  Upper Extremity  Assessment Overall WFL for tasks assessed  Lower Extremity Assessment  Lower Extremity Assessment Generalized weakness  Cervical / Trunk Assessment  Cervical / Trunk Assessment Normal  Bed Mobility  Overal bed mobility Needs Assistance  Bed Mobility Sidelying to Sit;Sit to Sidelying  Sidelying to sit Supervision;Min guard  Sit to sidelying Min guard  General bed mobility comments min guard and assisted with lines  Transfers  Overall transfer level Needs assistance  Equipment used 1 person hand held assist  Transfers Sit to/from Stand  Sit to Stand Min guard  General transfer comment min guard with care for lines  Ambulation/Gait  Ambulation/Gait assistance Min guard  Gait Distance (Feet) 100 Feet  Assistive device 1 person hand held assist  Gait Pattern/deviations Step-through pattern;Wide base of support;Decreased stride length;Drifts right/left  General Gait Details drifts close to obstacles with little regard, required cues for safety  Gait velocity reduced  Gait velocity interpretation <1.31 ft/sec, indicative of household ambulator  Balance  Overall balance assessment Needs assistance  Sitting-balance support Feet supported  Sitting balance-Leahy Scale Fair  Standing balance support Single extremity supported  Standing balance-Leahy Scale Fair  Standing balance comment requires min guard during gait with cues for clearing furniture and carts on hallway  General Comments  General comments (skin integrity, edema, etc.) pulses up during gait, to 112 with reduction as he stopped  Exercises  Exercises Other exercises (LE strength is WFL on BLE's)  PT - End of Session  Equipment Utilized During Treatment Gait belt  Activity Tolerance Patient limited by fatigue;Treatment limited secondary to medical complications (Comment)  Patient left in  bed;with call bell/phone within reach;with bed alarm set  Nurse Communication Mobility status  PT Assessment  PT Recommendation/Assessment  Patient needs continued PT services  PT Visit Diagnosis Other abnormalities of gait and mobility (R26.89);Other (comment) (vitals fluctuating)  PT Problem List Decreased range of motion;Decreased activity tolerance;Decreased balance;Decreased mobility;Decreased cognition;Decreased knowledge of use of DME;Decreased coordination;Decreased safety awareness;Cardiopulmonary status limiting activity  Barriers to Discharge Decreased caregiver support;Inaccessible home environment  Barriers to Discharge Comments home alone with stairs  PT Plan  PT Frequency (ACUTE ONLY) Min 3X/week  PT Treatment/Interventions (ACUTE ONLY) DME instruction;Gait training;Stair training;Functional mobility training;Therapeutic activities;Therapeutic exercise;Balance training;Neuromuscular re-education;Cognitive remediation;Patient/family education  AM-PAC PT "6 Clicks" Mobility Outcome Measure (Version 2)  Help needed turning from your back to your side while in a flat bed without using bedrails? 4  Help needed moving from lying on your back to sitting on the side of a flat bed without using bedrails? 3  Help needed moving to and from a bed to a chair (including a wheelchair)? 3  Help needed standing up from a chair using your arms (e.g., wheelchair or bedside chair)? 2  Help needed to walk in hospital room? 3  Help needed climbing 3-5 steps with a railing?  3  6 Click Score 18  Consider Recommendation of Discharge To: Home with [facility]  PT Recommendation  Follow Up Recommendations Home health PT;Supervision for mobility/OOB;Supervision/Assistance - 24 hour  PT equipment None recommended by PT  Individuals Consulted  Consulted and Agree with Results and Recommendations Patient  Acute Rehab PT Goals  Patient Stated Goal To get home and be independent  PT Goal Formulation With patient  Time For Goal Achievement 09/19/19  Potential to Achieve Goals Good  PT Time Calculation  PT Start Time (ACUTE ONLY) 1038  PT Stop Time  (ACUTE ONLY) 1106  PT Time Calculation (min) (ACUTE ONLY) 28 min  PT General Charges  $$ ACUTE PT VISIT 1 Visit  PT Evaluation  $PT Eval Moderate Complexity 1 Mod  PT Treatments  $Gait Training 8-22 mins  Written Expression  Dominant Hand Right   Mee Hives, PT MS Acute Rehab Dept. Number: [place] and [place]

## 2019-09-05 NOTE — ED Notes (Signed)
Pt's bed alarm was going off.  Found pt standing in room, slightly confused.  Stated he had to use the bathroom, bedside comode brought in, he did not use it.  Pt was able to be redirected.  Pt very happy when two pillows were found.

## 2019-09-06 ENCOUNTER — Other Ambulatory Visit: Payer: Self-pay | Admitting: *Deleted

## 2019-09-06 ENCOUNTER — Encounter: Payer: Self-pay | Admitting: *Deleted

## 2019-09-06 LAB — CBC
HCT: 43.5 % (ref 39.0–52.0)
Hemoglobin: 15 g/dL (ref 13.0–17.0)
MCH: 35.9 pg — ABNORMAL HIGH (ref 26.0–34.0)
MCHC: 34.5 g/dL (ref 30.0–36.0)
MCV: 104.1 fL — ABNORMAL HIGH (ref 80.0–100.0)
Platelets: 139 10*3/uL — ABNORMAL LOW (ref 150–400)
RBC: 4.18 MIL/uL — ABNORMAL LOW (ref 4.22–5.81)
RDW: 12.7 % (ref 11.5–15.5)
WBC: 17.2 10*3/uL — ABNORMAL HIGH (ref 4.0–10.5)
nRBC: 0 % (ref 0.0–0.2)

## 2019-09-06 LAB — COMPREHENSIVE METABOLIC PANEL
ALT: 28 U/L (ref 0–44)
AST: 43 U/L — ABNORMAL HIGH (ref 15–41)
Albumin: 3.5 g/dL (ref 3.5–5.0)
Alkaline Phosphatase: 78 U/L (ref 38–126)
Anion gap: 14 (ref 5–15)
BUN: 23 mg/dL (ref 8–23)
CO2: 26 mmol/L (ref 22–32)
Calcium: 8.7 mg/dL — ABNORMAL LOW (ref 8.9–10.3)
Chloride: 97 mmol/L — ABNORMAL LOW (ref 98–111)
Creatinine, Ser: 1.48 mg/dL — ABNORMAL HIGH (ref 0.61–1.24)
GFR calc Af Amer: 58 mL/min — ABNORMAL LOW (ref >60)
GFR calc non Af Amer: 50 mL/min — ABNORMAL LOW (ref >60)
Glucose, Bld: 137 mg/dL — ABNORMAL HIGH (ref 70–99)
Potassium: 4 mmol/L (ref 3.5–5.1)
Sodium: 137 mmol/L (ref 135–145)
Total Bilirubin: 0.8 mg/dL (ref 0.3–1.2)
Total Protein: 7.8 g/dL (ref 6.5–8.1)

## 2019-09-06 MED ORDER — HYDRALAZINE HCL 25 MG PO TABS
25.0000 mg | ORAL_TABLET | Freq: Three times a day (TID) | ORAL | Status: DC
  Administered 2019-09-06 – 2019-09-16 (×31): 25 mg via ORAL
  Filled 2019-09-06 (×31): qty 1

## 2019-09-06 MED ORDER — ALUM & MAG HYDROXIDE-SIMETH 200-200-20 MG/5ML PO SUSP
30.0000 mL | ORAL | Status: DC | PRN
  Administered 2019-09-06 – 2019-09-08 (×2): 30 mL via ORAL
  Filled 2019-09-06 (×2): qty 30

## 2019-09-06 MED ORDER — LIDOCAINE VISCOUS HCL 2 % MT SOLN
15.0000 mL | Freq: Once | OROMUCOSAL | Status: AC
  Administered 2019-09-06: 15 mL via ORAL
  Filled 2019-09-06: qty 15

## 2019-09-06 MED ORDER — ALUM & MAG HYDROXIDE-SIMETH 200-200-20 MG/5ML PO SUSP
30.0000 mL | Freq: Once | ORAL | Status: AC
  Administered 2019-09-06: 30 mL via ORAL
  Filled 2019-09-06: qty 30

## 2019-09-06 NOTE — Progress Notes (Signed)
Subjective: Patient reports no headaches Objective: Vital signs in last 24 hours: Temp:  [97.8 F (36.6 C)-98.8 F (37.1 C)] 97.8 F (36.6 C) (06/25 0804) Pulse Rate:  [77-106] 106 (06/25 0804) Resp:  [16-21] 16 (06/25 0804) BP: (108-172)/(64-88) 172/88 (06/25 0804) SpO2:  [92 %-97 %] 95 % (06/25 0804)  Intake/Output from previous day: 06/24 0701 - 06/25 0700 In: 200 [P.O.:200] Out: 200 [Urine:200] Intake/Output this shift: No intake/output data recorded.  Speech fluent No pronator drift A+Ox3  Lab Results: Recent Labs    09/05/19 0436 09/06/19 0822  WBC 7.6 17.2*  HGB 13.2 15.0  HCT 40.6 43.5  PLT 120* 139*   BMET Recent Labs    09/05/19 0436 09/06/19 0822  NA 138 137  K 4.1 4.0  CL 101 97*  CO2 26 26  GLUCOSE 136* 137*  BUN 18 23  CREATININE 1.90* 1.48*  CALCIUM 8.6* 8.7*    Studies/Results: CT Head Wo Contrast  Result Date: 09/04/2019 CLINICAL DATA:  Altered mental status. EXAM: CT HEAD WITHOUT CONTRAST TECHNIQUE: Contiguous axial images were obtained from the base of the skull through the vertex without intravenous contrast. COMPARISON:  May 01, 2018 FINDINGS: Brain: There is mild cerebral atrophy with widening of the extra-axial spaces and ventricular dilatation. There are areas of decreased attenuation within the white matter tracts of the supratentorial brain, consistent with microvascular disease changes. A 1.9 cm x 1.6 cm lobulated well-defined low-attenuation lesion, with thin surrounding hyperdense rim, is seen within the right parietal lobe. This represents a new finding when compared to the prior study. A moderate to marked amount of surrounding white matter low attenuation is seen within the right parietal and right temporal lobes, consistent with vasogenic edema. A mild amount of mass effect is seen on the adjacent sulci. Approximately 2.37 mm right to left midline shift is seen. Small to moderate sized areas of white matter low attenuation are  seen within the right frontal, right posterior parietal and left occipital lobes. These are new when compared to the prior study. Vascular: No hyperdense vessels are identified. Skull: Normal. Negative for fracture or focal lesion. Sinuses/Orbits: Multiple lobulated bilateral maxillary sinus polyps versus mucous retention cysts are seen. Other: None. IMPRESSION: 1. 1.9 cm x 1.6 cm lobulated well-defined low-attenuation lesion, with thin surrounding hyperdense rim, within the right parietal lobe, with a moderate to marked amount of surrounding vasogenic edema. This represents a new finding when compared to the prior study and is worrisome for the presence of an underlying neoplasm. MRI correlation is recommended. 2. Small to moderate sized areas of white matter low attenuation are seen within the right frontal, right posterior parietal and left occipital lobes. These are new when compared to the prior study and are concerning for additional areas of vasogenic edema versus white matter ischemia. MRI correlation is recommended. 3. Approximately 2.37 mm right to left midline shift. 4. Multiple lobulated bilateral maxillary sinus polyps versus mucous retention cysts. Electronically Signed   By: Virgina Norfolk M.D.   On: 09/04/2019 17:51   CT HEAD W & WO CONTRAST  Result Date: 09/05/2019 CLINICAL DATA:  Non-small-cell lung cancer. Abnormal CT. Unable to get MRI due to pacemaker EXAM: CT HEAD WITHOUT AND WITH CONTRAST TECHNIQUE: Contiguous axial images were obtained from the base of the skull through the vertex without and with intravenous contrast CONTRAST:  25mL OMNIPAQUE IOHEXOL 350 MG/ML SOLN COMPARISON:  CT head without contrast 09/04/2019, CT head 05/01/2018 FINDINGS: Brain: 19 mm mass right insular cortex.  The mass has a thin calcified wall with minimal enhancement. This is unchanged from yesterday. There is a large amount of surrounding white matter vasogenic edema. Additional areas of vasogenic edema in the  left occipital lobe, left parietal lobe, high right frontal lobe, and right parietal lobe unchanged from yesterday. These do not enhance but are most consistent with metastatic disease. Ventricle size is normal.  Negative for hemorrhage. Vascular: Negative for hyperdense vessel. Normal vascular enhancement. Skull: No focal skeletal lesions. Sinuses/Orbits: Mild mucosal edema paranasal sinuses. Negative orbit. Other: None IMPRESSION: 19 mm partially calcified mass in the right insular cortex with a large amount of surrounding vasogenic edema. This shows minimal enhancement however is consistent with metastatic disease. Additional areas of vasogenic edema bilaterally also consistent with metastatic disease although these do not show abnormal enhancement. Lack of enhancement may be due to treated tumor. Electronically Signed   By: Franchot Gallo M.D.   On: 09/05/2019 20:39   CT Chest Wo Contrast  Result Date: 09/04/2019 CLINICAL DATA:  59 year old male with chest pain and shortness of breath. Metastatic lung cancer. EXAM: CT CHEST WITHOUT CONTRAST TECHNIQUE: Multidetector CT imaging of the chest was performed following the standard protocol without IV contrast. COMPARISON:  Chest CT dated 04/04/2019. FINDINGS: Evaluation of this exam is limited in the absence of intravenous contrast. Cardiovascular: There is no cardiomegaly or pericardial effusion. Right pectoral dual lead pacemaker device. The thoracic aorta and central pulmonary arteries are grossly unremarkable. Coronary vascular calcifications of the LAD. Mediastinum/Nodes: Similar or minimally increased right hilar and mediastinal adenopathy. For example a right hilar lymph node measuring 19 mm previously measured 17 mm and a lymph node anterior to the trachea measuring 19 mm in short axis previously measured 15 mm. There is circumferential thickening of the distal esophagus which may represent esophagitis or infiltration of the esophagus by malignancy. No  mediastinal fluid collection. Lungs/Pleura: Diffusely thickened and lobular right pleural consistent with metastatic disease. Overall similar or slight increased in the pleural thickening since the prior CT. There is a 6.6 x 5.0 cm (previously 6.1 x 4.7 cm) ovoid mass at the right cardiophrenic angle. There is probable trace right pleural effusion. There is diffuse interstitial and interlobular septal prominence of the right lung base which may represent edema or lymphangitic carcinomatosis. Several nodular densities at the right lung base similar to prior CT consistent with metastatic disease. The left lung is clear. No pneumothorax. The central airways are patent. Upper Abdomen: Fatty liver. A 2 cm hypodense lesion in the right lobe of the liver similar to prior CT is not characterized but demonstrates fluid attenuation, likely a cyst. Partially visualized 6 mm nodular density in the left upper abdomen (162/5). Musculoskeletal: No acute osseous pathology. No obvious metastatic bone lesions. There is a 1 cm nodular density in the right lateral chest wall (139/5) similar to prior CT and most consistent with a metastatic disease or a mildly enlarged lymph node. IMPRESSION: 1. Similar or minimally progressed metastatic disease in the right lung, right pleural and hilar/mediastinal lymph node. 2. Circumferential thickening of the distal esophagus may represent esophagitis or infiltration of the esophagus by malignancy. 3. Fatty liver. 4. Partially visualized 6 mm nodular density in the left upper abdomen. 5. Aortic Atherosclerosis (ICD10-I70.0). Electronically Signed   By: Anner Crete M.D.   On: 09/04/2019 18:03   EEG adult  Result Date: 09/05/2019 Lora Havens, MD     09/05/2019  2:11 PM Patient Name: Daniel Bryant MRN: [id number]  Epilepsy Attending: Lora Havens Referring Physician/Provider: Dr Berle Mull Date: 09/05/2019 Duration: 26.16 mins Patient history: 59 year old male presented to the ED  last night after having hallucinations for a couple days. CT head revealed a 1.9x1.6cm lesion in the right parietal lobe with surrounding vasogenic edema.  EEG to evaluate for seizure Level of alertness: Lethargic AEDs during EEG study: LEV Technical aspects: This EEG study was done with scalp electrodes positioned according to the 10-20 International system of electrode placement. Electrical activity was acquired at a sampling rate of 500Hz  and reviewed with a high frequency filter of 70Hz  and a low frequency filter of 1Hz . EEG data were recorded continuously and digitally stored. Description: No clear posterior dominant rhythm was seen. Sharp waves were seen in left parieto-occipital region at times periodic at 0.25Hz . EEG showed continuous generalized 6-9Hz  theta-alpha activity as well as 3-5Hz  theta-delta slowing in left posterior quadrant.  Hyperventilation and photic stimulation were not performed.   ABNORMALITY - Sharp wave, left parieto- occipital region -Continuous slow, generalized and maximal left posterior quadrant IMPRESSION: This study showed evidence of potential epileptogenicity as well as cortical dysfunction in left posterior quadrant secondary to underlying structural abnormality. Additionally, there is evidence of mild to moderate diffuse encephalopathy, non specific to etiology. No seizures were seen throughout the recording. 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 - Unfortunately, patient unable to have MRI as he had a non-MRI-compatible pacemaker placed in 2017. This limits our surveillance and characterization of his CNS disease, and potentially with targeting.  Nevertheless, he would be a good candidate for SRS to this lesion.  Will discuss with our radiation oncology colleagues.   Vallarie Mare 09/06/2019, 1:14 PM

## 2019-09-06 NOTE — TOC Initial Note (Addendum)
Transition of Care [facility]) - Initial/Assessment Note    Patient Details  Name: Daniel Bryant MRN: [id number] Date of Birth: 07-31-1960  Transition of Care [facility]) CM/SW Contact:    Angelita Ingles, RN Phone Number: [phone]  09/06/2019, 2:04 PM  Clinical Narrative:                 CM received message from OT requesting to know if patient could receive referral for ALF. Patient is refusing ALF. States that he will not go to ALF but he will go back to his tiny home community. CM spoke with Guerry Minors MSW, LCSWA case manger from tiny house community development  at bedside and states that she has reached out to the [place] to see what resources are available to the patient. [facility]. CM will continue to follow.   Expected Discharge Plan: Home/Self Care Barriers to Discharge: Continued Medical Work up, Other (comment) (memory impairment)   Patient Goals and CMS Choice Patient states their goals for this hospitalization and ongoing recovery are:: Wants to go back to his apartment   Choice offered to / list presented to : NA  Expected Discharge Plan and Services Expected Discharge Plan: Home/Self Care In-house Referral: NA Discharge Planning Services: CM Consult Post Acute Care Choice: NA Living arrangements for the past 2 months: Single Family Home Risk manager)                 DME Arranged: N/A DME Agency: NA       HH Arranged: NA HH Agency: NA        Prior Living Arrangements/Services Living arrangements for the past 2 months: Single Family Home Risk manager) Lives with:: Self Quarry manager) Patient language and need for interpreter reviewed:: Yes Do you feel safe going back to the place where you live?: Yes      Need for Family Participation in Patient Care: Yes (Comment) Care giver support system in place?: No (comment) (Patient states that he does not have any family)    Criminal Activity/Legal Involvement Pertinent to Current Situation/Hospitalization: No - Comment as needed  Activities of Daily Living Home Assistive Devices/Equipment: None ADL Screening (condition at time of admission) Patient's cognitive ability adequate to safely complete daily activities?: Yes Is the patient deaf or have difficulty hearing?: No Does the patient have difficulty seeing, even when wearing glasses/contacts?: No Does the patient have difficulty concentrating, remembering, or making decisions?: No Patient able to express need for assistance with ADLs?: Yes Does the patient have difficulty dressing or bathing?: No Independently performs ADLs?: Yes (appropriate for developmental age) Does the patient have difficulty walking or climbing stairs?: No Weakness of Legs: None Weakness of Arms/Hands: None  Permission Sought/Granted   Permission granted to share information with : No              Emotional Assessment Appearance:: Appears older than stated age Attitude/Demeanor/Rapport: Unable to Assess (rambling conversations / forgetful) Affect (typically observed): Other (comment) (patient very forgetful during the conversation) Orientation: : Oriented to Self, Oriented to Place, Oriented to  Time, Oriented to Situation (forgetful) Alcohol / Substance Use: Not Applicable Psych Involvement: No (comment)  Admission diagnosis:  Hallucinations [R44.3] Vasogenic brain edema (HCC) [G93.6] AKI (acute kidney injury) ([place]) [N17.9] Chest pain, unspecified type [R07.9] Lung cancer metastatic to brain ([place]) [C34.90, C79.31] Non-small cell lung cancer metastatic to brain ([place]) [C34.90, C79.31] Patient Active Problem List  Diagnosis Date Noted  . Obesity (BMI 30-39.9) 09/05/2019  . Non-small cell lung cancer metastatic to brain ([place]) 09/04/2019  . Leucocytosis 09/04/2019  . Macrocytosis 09/04/2019  . Elevated SGOT (AST) 09/04/2019  . Chest pain due to GERD 09/04/2019  .  Esophagitis 09/04/2019  . 2.37 mm right to left midline shift 09/04/2019  . Hallucination, visual 09/04/2019  . Acute encephalopathy 09/04/2019  . Depression 09/04/2019  . Dvt femoral (deep venous thrombosis) ([place]) 05/19/2019  . DVT (deep venous thrombosis) ([place]) 05/18/2019  . Diarrhea 11/06/2018  . Viral gastroenteritis 11/05/2018  . Orthostatic hypotension 11/05/2018  . Hypomagnesemia 11/05/2018  . AKI (acute kidney injury) ([place]) 11/05/2018  . Chemotherapy-induced neutropenia ([place])   . Neutropenic fever ([place]) 08/07/2018  . 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 Antineoplastic chemotherapy induced pancytopenia ([place]) 08/07/2018  . Hyponatremia 08/07/2018  . Drug-induced skin rash 07/24/2018  . Goals of care, counseling/discussion 06/14/2018  . Encounter for antineoplastic chemotherapy 06/14/2018  . Encounter for antineoplastic immunotherapy 06/14/2018  . Adenocarcinoma of right lung ([place]) 05/02/2018  . COPD (chronic obstructive pulmonary disease) ([place]) 05/02/2018  . Recurrent pleural effusion on right 05/01/2018  . Pacemaker 04/21/2018  . 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 Hypokalemia 04/05/2018  . High cholesterol 04/05/2018  . Hypertension 04/05/2018  . Polycythemia 04/05/2018   PCP:  [facility], [place]:   [place], [place] [address] Phone: [phone] Fax: [phone]  [facility] [place] [place] - [address] [place] [address] Phone: [phone] Fax: [phone]     Social Determinants of Health ([place]) Interventions    Readmission Risk Interventions Readmission Risk Prevention Plan 05/21/2019  Transportation Screening Complete  PCP or Specialist Appt within 5-7 Days Complete  Home Care Screening Complete  Medication Review (RN CM) Complete

## 2019-09-06 NOTE — Progress Notes (Signed)
PROGRESS NOTE  Daniel Bryant CHY:[id number] DOB: 1960-08-14 DOA: 09/04/2019 PCP: [facility]  Brief History   59 year old male with past medical history of stage IV non-small cell lung cancer/adenocarcinoma currently on chemotherapy with last dose in March and then missed April and May appointments plus hypertension, depression and neuropathy presented to the emergency room on 6/23 complaining initially of chest pain associated with nausea and vomiting.  However, on examination, he was found to have acute kidney injury, tongue bites and acute confusion.  CT scan of the head noted metastatic lesions with midline shift, new finding for patient.  Patient started on Decadron and neurosurgery consulted.    Neurology consulted for EEG which was done which noted a sharp waves in the left parieto-occipital region, evidence of potential epileptogenic city as well as cortical dysfunction of the left posterior quadrant secondary to underlying structural abnormality plus evidence of mild to moderate diffuse encephalopathy.  Patient seen by neurosurgery with initial plans to get an MRI, but this needed to be canceled due to patient having a pacemaker.  CT scan of head with contrast is pending. Neurosurgery will discuss the patient with radiation therapy for consideration of SRS.  Consultants  Neurosurgery Oncology Neurology Procedures  . None  Antibiotics   Anti-infectives (From admission, onward)   None    .  Subjective  The patient is up and wandering about his room looking for his hiking boots. I locate the slippers that he apparently wore to the hospital. Pt must be redirected multiple times during my visit. He is agitated and confused.   Objective   Vitals:  Vitals:   09/06/19 0804 09/06/19 1534  BP: (!) 172/88 (!) 157/97  Pulse: (!) 106 86  Resp: 16 16  Temp: 97.8 F (36.6 C) 98.1 F (36.7 C)  SpO2: 95% 96%   Exam:  Constitutional:  . The patient is awake, alert, and  oriented x 3. No acute distress. Respiratory:  . No increased work of breathing. . No wheezes, rales, or rhonchi . No tactile fremitus Cardiovascular:  . Regular rate and rhythm . No murmurs, ectopy, or gallups. . No lateral PMI. No thrills. Abdomen:  . Abdomen is soft, non-tender, non-distended . No hernias, masses, or organomegaly . Normoactive bowel sounds.  Musculoskeletal:  . No cyanosis, clubbing, or edema Skin:  . No rashes, lesions, ulcers . palpation of skin: no induration or nodules Neurologic:  . CN 2-12 intact . Sensation all 4 extremities intact . Motor intact . He is confused, agitated, easily distracted. Psychiatric:  . Unable to evaluate due to clinical status.  I have personally reviewed the following:   Today's Data  . Vitals, CMP, CBC  Micro Data  . Blood cultures x 2.   Imaging  . CT head  Scheduled Meds: . dexamethasone (DECADRON) injection  8 mg Intravenous Q6H  . docusate sodium  100 mg Oral BID  . famotidine  20 mg Oral Daily  . feeding supplement (PRO-STAT SUGAR FREE 64)  30 mL Oral BID  . folic acid  1 mg Oral Daily  . gabapentin  100 mg Oral QHS  . magic mouthwash  5 mL Oral QID  . nystatin  5 mL Oral QID  . pantoprazole  40 mg Oral BID AC  . potassium chloride  40 mEq Oral Once  . sodium chloride flush  3 mL Intravenous Q12H  . sucralfate  1 g Oral TID WC & HS   Continuous Infusions: . lactated ringers 100  mL/hr at 09/06/19 0849  . levETIRAcetam 500 mg (09/06/19 0844)    Principal Problem:   Non-small cell lung cancer metastatic to brain [facility]) Active Problems:   Hypokalemia   Hypertension   Pacemaker   COPD (chronic obstructive pulmonary disease) (HCC)   AKI (acute kidney injury) ([place])   DVT (deep venous thrombosis) (HCC)   Leucocytosis   Macrocytosis   Elevated SGOT ([facility])   Chest pain due to GERD   Esophagitis   2.37 mm right to left midline shift   Hallucination, visual   Acute encephalopathy   Depression    Obesity (BMI 30-39.9)   LOS: 2 days   A & P  Non-small cell lung cancer new findings of metastatic to brain [facility]), presenting as hallucinations, confusion: Suspect underlying seizure activity given tongue biting and findings.  On Decadron.  EEG as above noted.  Neurosurgery seeing and will discuss the patient with radiation oncology. No MRI due to incompatible MRI in 2018. Was getting chemotherapy, but missed the last 2 sessions. This is likely secondary to his brain mets and he is not able to care for himself at this point.  Confusion: Secondary to brain met. Agitation likely due to steroids.   Hypokalemia: Replete. Monitor and supplement as necessary.   Hypertension: Blood pressure is elevated. The patient will be started on hydralazine. Monitor.  Pacemaker: Noted. Incompatible with MRI.  COPD (chronic obstructive pulmonary disease) ([place]): Stable. Monitor.  AKI (acute kidney injury) ([place]) in the setting of chronic kidney disease, stage I-2: Likely secondary to seizure.  Hydrating.  Creatinine started to trend downward. Creatinine 1.48 this morning. Monitor creatinine, electrolytes, and volume status. Avoid nephrotoxic substances and hypotension.  DVT (deep venous thrombosis) ([place]): Diagnosed 3 months ago.  Treated with thrombectomy and currently on therapeutic Lovenox at home.  In discussion with neurosurgery given his CNS lesion and midline shift, no anticoagulation currently.  On SCDs.  Leucocytosis: Likely stress margination and steroids.   Macrocytosis: Incidentally noted. No anemia.  Elevated SGOT (AST): Noted. Monitor.  Chest pain due to GERD/esophagitis: No evidence of cardiac ischemia at this time. Troponin stable. Continue PPI.  Depression: Noted. Continue home medications.  Obesity (BMI 30-39.9): Noted. Patient meets criteria with BMI greater than 30.  I have seen and examined this patient myself. I have spent 32 mintues in his evaluation and care.  DVT  Prophylaxis: SCD's Code Status: Full code Family Communication: No family.  Contacted friend who is on his contact list and left message. Disposition Plan: Depending on neurosurgical findings, options may range from palliative care to skilled nursing.  Strongly doubt that he will be able to go home by himself.  Status is: Inpatient  Remains inpatient appropriate because:Inpatient level of care appropriate due to severity of illness   Dispo: The patient is from: Home              Anticipated d/c is to: tbd              Anticipated d/c date is: 3 days              Patient currently is not medically stable to d/c.  Suad Autrey, DO [facility] Direct contact: see [email or web address]  7PM-7AM contact night coverage as above 09/06/2019, 4:27 PM  LOS: 2 days

## 2019-09-06 NOTE — Progress Notes (Signed)
Physical Therapy Treatment Patient Details Name: Daniel Bryant MRN: [id number] DOB: 08-31-1960 Today's Date: 09/06/2019    History of Present Illness 59 yo male with onset of AMS and hallucinations of bugs in the house, missed recent appointments and general confusion was sent to ED.  Pt is complaining of tongue pain and states it was hurting and injured pre-hosp trip. Having HA's, confusion interfering with history.  Has esophageal mets, R parietal mets.  PMHx:  asthma, CA to lung, fatty liver, atherosclerosis, seizures, pacemaker, PTSD, SSS, OSA, HTN, COPD, chest tube, memory changes,     PT Comments    Pt demonstrating excellent progress with PT.  He has met all PT goals and scores on DGI and BBS indicate low fall risk.  Pt reports he feels at his baseline.  He does have chronic low back pain but was able to demonstrate positions and exercises that relief pain without cues.  Pt does have some cognitive deficits that are being addressed by SLP and OT, but no further PT needs.    Follow Up Recommendations  Other (comment) (No PT needs)     Equipment Recommendations  None recommended by PT    Recommendations for Other Services       Precautions / Restrictions Precautions Precautions: Fall    Mobility  Bed Mobility Overal bed mobility: Independent                Transfers Overall transfer level: Independent                  Ambulation/Gait Ambulation/Gait assistance: Supervision Gait Distance (Feet): 500 Feet         General Gait Details: Had supervision with PT but has been ambulating independently; steady without LOB   Stairs Stairs: Yes Stairs assistance: Supervision Stair Management: One rail Left;Alternating pattern Number of Stairs: 6 General stair comments: safely without difficluty   Wheelchair Mobility    Modified Rankin (Stroke Patients Only)       Balance Overall balance assessment: Independent                                Standardized Balance Assessment Standardized Balance Assessment : Berg Balance Test;Dynamic Gait Index Berg Balance Test Sit to Stand: Able to stand without using hands and stabilize independently Standing Unsupported: Able to stand safely 2 minutes Sitting with Back Unsupported but Feet Supported on Floor or Stool: Able to sit safely and securely 2 minutes Stand to Sit: Sits safely with minimal use of hands Transfers: Able to transfer safely, minor use of hands Standing Unsupported with Eyes Closed: Able to stand 10 seconds safely Standing Ubsupported with Feet Together: Able to place feet together independently and stand 1 minute safely From Standing, Reach Forward with Outstretched Arm: Can reach confidently >25 cm (10") From Standing Position, Pick up Object from Floor: Able to pick up shoe safely and easily From Standing Position, Turn to Look Behind Over each Shoulder: Looks behind from both sides and weight shifts well Turn 360 Degrees: Able to turn 360 degrees safely in 4 seconds or less Standing Unsupported, Alternately Place Feet on Step/Stool: Able to stand independently and safely and complete 8 steps in 20 seconds Standing Unsupported, One Foot in Front: Needs help to step but can hold 15 seconds Standing on One Leg: Tries to lift leg/unable to hold 3 seconds but remains standing independently Total Score: 50 Dynamic Gait Index Level Surface: Normal  Change in Gait Speed: Normal Gait with Horizontal Head Turns: Normal Gait with Vertical Head Turns: Normal Gait and Pivot Turn: Normal Step Over Obstacle: Normal Step Around Obstacles: Normal Steps: Mild Impairment Total Score: 23      Cognition Arousal/Alertness: Awake/alert Behavior During Therapy: WFL for tasks assessed/performed Overall Cognitive Status: Within Functional Limits for tasks assessed                                 General Comments: Pt was able to follow multistep commands and A  and O x 4 with PT.  He does have some deficits with higher level cog activties followed by speech and OT      Exercises      General Comments General comments (skin integrity, edema, etc.): All VSS.  Pt was able to answer all questions appropriately and demonstrated good safety awareness.  Pt reports chronic back pain from herniated disk.  Was going to educate pt on repeated ext and prone, but prior to PT instructing pt stood up and demonstrated that he does repeated extension to ease pain and reports lays prone in the morning to alleviate pain.      Pertinent Vitals/Pain Pain Assessment: 0-10 Pain Score: 3  Pain Location: neck, back-chronic Pain Descriptors / Indicators: Dull;Aching Pain Intervention(s): Monitored during session;Other (comment) (see general comments below)    Home Living                      Prior Function            PT Goals (current goals can now be found in the care plan section) Acute Rehab PT Goals Patient Stated Goal: to go home PT Goal Formulation: With patient Time For Goal Achievement: 09/19/19 Potential to Achieve Goals: Good Progress towards PT goals: Goals met/education completed, patient discharged from PT    Frequency           PT Plan Discharge plan needs to be updated    Co-evaluation              AM-PAC PT "6 Clicks" Mobility   Outcome Measure  Help needed turning from your back to your side while in a flat bed without using bedrails?: None Help needed moving from lying on your back to sitting on the side of a flat bed without using bedrails?: None Help needed moving to and from a bed to a chair (including a wheelchair)?: None Help needed standing up from a chair using your arms (e.g., wheelchair or bedside chair)?: None Help needed to walk in hospital room?: None Help needed climbing 3-5 steps with a railing? : None 6 Click Score: 24    End of Session   Activity Tolerance: Patient tolerated treatment  well Patient left: in bed;with call bell/phone within reach Nurse Communication: Mobility status       Time: 1530-1559 PT Time Calculation (min) (ACUTE ONLY): 29 min  Charges:  $Gait Training: 8-22 mins $Neuromuscular Re-education: 8-22 mins                     Abran Richard, PT Acute Rehab Services Pager [phone] [facility] [place] [facility] 09/06/2019, 4:10 PM

## 2019-09-06 NOTE — Progress Notes (Signed)
Occupational Therapy Evaluation Patient Details Name: Daniel Bryant MRN: [id number] DOB: Feb 10, 1961 Today's Date: 09/06/2019    History of Present Illness 59 yo male with onset of AMS and hallucinations of bugs in the house, missed recent appointments and general confusion was sent to ED.  Pt is complaining of tongue pain and states it was hurting and injured pre-hosp trip. Having HA's, confusion interfering with history.  Has esophageal mets, R parietal mets.  PMHx:  asthma, CA to lung, fatty liver, atherosclerosis, seizures, pacemaker, PTSD, SSS, OSA, HTN, COPD, chest tube, memory changes,    Clinical Impression   PTA, pt reports Independence with ADLs, IADLs and mobility without AD. Pt lives in tiny house community for veterans. Pt with recent hx this year of worsening memory impacting pt's health (missed chemo treatments, mismanagement of meds, consumed spoiled food per chart). Pt presents today generally physically capable of completing mobility to bathroom, toileting task, and oral care standing at sink without physical assist. Pt with mild unsteadiness during dynamic standing balance challenges, requiring min guard at times. Pt's main barrier to independence is cognitive function. Pt would benefit from ALF placement, as meal prep and med mgmt tasks may not be safe for pt to complete. If ALF not possible, recommend 24/7 supervision for pt at home and assistance with IADLs and transportation. Will continue to follow acutely.     Follow Up Recommendations  Supervision/Assistance - 24 hour (ALF vs home w/ OT services)    Equipment Recommendations  None recommended by OT    Recommendations for Other Services       Precautions / Restrictions Precautions Precautions: Fall Restrictions Weight Bearing Restrictions: No      Mobility Bed Mobility               General bed mobility comments: pt sitting EOB on OT entry  Transfers Overall transfer level: Needs  assistance Equipment used: None Transfers: Sit to/from [employer] Transfers Sit to Stand: Supervision Stand pivot transfers: Min guard;Supervision       General transfer comment: min guard to supervision to ensure steadiness     Balance Overall balance assessment: Needs assistance Sitting-balance support: Feet supported Sitting balance-Leahy Scale: Good     Standing balance support: No upper extremity supported;During functional activity Standing balance-Leahy Scale: Fair Standing balance comment: min guard to supervision to maintain steadiness in standing                           ADL either performed or assessed with clinical judgement   ADL Overall ADL's : Needs assistance/impaired Eating/Feeding: Independent;Sitting   Grooming: Independent;Standing   Upper Body Bathing: Supervision/ safety;Sitting   Lower Body Bathing: Supervison/ safety;Sit to/from stand   Upper Body Dressing : Supervision/safety;Sitting   Lower Body Dressing: Supervision/safety;Set up;Sit to/from stand   Toilet Transfer: Supervision/safety;Ambulation;Regular Toilet;Grab bars Toilet Transfer Details (indicate cue type and reason): Pt supervision for safety for ambulation to bathroom with assistance and reminders needed to bring IV pole with pt Toileting- Clothing Manipulation and Hygiene: Supervision/safety;Sit to/from stand Toileting - Clothing Manipulation Details (indicate cue type and reason): Supervision for urination standing at toilet - cued to use grab bar for stability      Functional mobility during ADLs: Min guard;Supervision/safety;Cueing for safety General ADL Comments: min guard progressing to supervision for short distance mobility in room without AD, mild unsteadiness noted     Vision Baseline Vision/History: Wears glasses Wears Glasses: Distance only Patient Visual Report:  Blurring of vision;No change from baseline ("fuzzy", but pt reports this typically occurs  when BP high) Vision Assessment?: No apparent visual deficits     Perception     Praxis      Pertinent Vitals/Pain Pain Assessment: 0-10 Pain Score: 6  Pain Location: neck, back Pain Descriptors / Indicators: Dull;Aching Pain Intervention(s): Monitored during session;Limited activity within patient's tolerance;Patient requesting pain meds-RN notified     Hand Dominance Left (ambidextrious, mostly L hand dominant )   Extremity/Trunk Assessment Upper Extremity Assessment Upper Extremity Assessment: Overall WFL for tasks assessed   Lower Extremity Assessment Lower Extremity Assessment: Defer to PT evaluation   Cervical / Trunk Assessment Cervical / Trunk Assessment: Normal   Communication Communication Communication: No difficulties   Cognition Arousal/Alertness: Awake/alert Behavior During Therapy: Restless Overall Cognitive Status: Impaired/Different from baseline Area of Impairment: Orientation;Attention;Safety/judgement;Awareness                 Orientation Level: Situation (no awareness of lung CA diagnosis (reports PNA/asthma)) Current Attention Level: Sustained     Safety/Judgement: Decreased awareness of safety;Decreased awareness of deficits Awareness: Emergent   General Comments: Pt A&Ox3. No awareness of cancer diagnosis during evaluation, reports Independence with all ADLs, IADLs but a note prior to admission noted pt reports nausea from eating moldy soup.    General Comments  HR WFL, 80s bpm during session. Assessed BP at 155/100 - RN notified. Pt restless and talkative during OT session. Appears to cover up cognitive deficits well     Exercises     Shoulder Instructions      Home Living Family/patient expects to be discharged to:: Private residence Living Arrangements: Alone Available Help at Discharge: Friend(s) Type of Home: House Home Access: Stairs to enter [employer] of Steps: 2+1 Entrance Stairs-Rails: Left Home Layout:  One level     Bathroom Shower/Tub: Occupational psychologist: Standard     Home Equipment: Grab bars - tub/shower      Lives With: Alone    Prior Functioning/Environment Level of Independence: Independent        Comments: Pt reports walking witfhout AD, independence with ADL/IADL . Pt reports still driving        OT Problem List: Impaired balance (sitting and/or standing);Decreased cognition;Decreased safety awareness      OT Treatment/Interventions: Self-care/ADL training;Therapeutic exercise;Energy conservation;Therapeutic activities;Patient/family education    OT Goals(Current goals can be found in the care plan section) Acute Rehab OT Goals Patient Stated Goal: to go home OT Goal Formulation: With patient Time For Goal Achievement: 09/20/19 Potential to Achieve Goals: Fair ADL Goals Pt Will Perform Lower Body Bathing: with modified independence;sit to/from stand Pt Will Transfer to Toilet: with modified independence;ambulating;regular height toilet Pt Will Perform Toileting - Clothing Manipulation and hygiene: with modified independence;sit to/from stand Additional ADL Goal #1: Pt will verbalize at least 2 memory aid strategies to implement during ADLs/IADLs in order to maximize safety and independence. Additional ADL Goal #2: Pt will demonstrate medication management at Supervision level in order to maximize overall health and wellness  OT Frequency: Min 2X/week   Barriers to D/C:            Co-evaluation              AM-PAC OT "6 Clicks" Daily Activity     Outcome Measure Help from another person eating meals?: None Help from another person taking care of personal grooming?: None Help from another person toileting, which includes using toliet, bedpan, or  urinal?: A Little Help from another person bathing (including washing, rinsing, drying)?: A Little Help from another person to put on and taking off regular upper body clothing?: A Little Help  from another person to put on and taking off regular lower body clothing?: A Little 6 Click Score: 20   End of Session Equipment Utilized During Treatment: Gait belt Nurse Communication: Mobility status;Other (comment) (BP, pain)  Activity Tolerance: Patient tolerated treatment well Patient left: in bed;with call bell/phone within reach  OT Visit Diagnosis: Unsteadiness on feet (R26.81);Other symptoms and signs involving cognitive function                Time: 1131-1157 OT Time Calculation (min): 26 min Charges:  OT General Charges $OT Visit: 1 Visit OT Evaluation $OT Eval Moderate Complexity: 1 Mod OT Treatments $Self Care/Home Management : 8-22 mins  Layla Maw, OTR/L  Layla Maw 09/06/2019, 2:52 PM

## 2019-09-06 NOTE — Progress Notes (Signed)
I received a call from [employer] about Daniel Bryant. I called them back but was unable to reach. I did leave vm message for them to call me with my name and phone number.

## 2019-09-06 NOTE — Plan of Care (Signed)

## 2019-09-06 NOTE — Evaluation (Signed)
Speech Language Pathology Evaluation Patient Details Name: Daniel Bryant MRN: [id number] DOB: 03-May-1960 Today's Date: 09/06/2019 Time: 5697-9480 SLP Time Calculation (min) (ACUTE ONLY): 27 min  Problem List:  Patient Active Problem List   Diagnosis Date Noted  . Obesity (BMI 30-39.9) 09/05/2019  . Non-small cell lung cancer metastatic to brain ([place]) 09/04/2019  . Leucocytosis 09/04/2019  . Macrocytosis 09/04/2019  . Elevated SGOT ([facility]) 09/04/2019  . Chest pain due to GERD 09/04/2019  . Esophagitis 09/04/2019  . 2.37 mm right to left midline shift 09/04/2019  . Hallucination, visual 09/04/2019  . Acute encephalopathy 09/04/2019  . Depression 09/04/2019  . Dvt femoral (deep venous thrombosis) ([place]) 05/19/2019  . DVT (deep venous thrombosis) ([place]) 05/18/2019  . Diarrhea 11/06/2018  . Viral gastroenteritis 11/05/2018  . Orthostatic hypotension 11/05/2018  . Hypomagnesemia 11/05/2018  . AKI (acute kidney injury) ([place]) 11/05/2018  . Chemotherapy-induced neutropenia ([place])   . Neutropenic fever ([place]) 08/07/2018  . 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 Antineoplastic chemotherapy induced pancytopenia ([place]) 08/07/2018  . Hyponatremia 08/07/2018  . Drug-induced skin rash 07/24/2018  . Goals of care, counseling/discussion 06/14/2018  . Encounter for antineoplastic chemotherapy 06/14/2018  . Encounter for antineoplastic immunotherapy 06/14/2018  . Adenocarcinoma of right lung ([place]) 05/02/2018  . COPD (chronic obstructive pulmonary disease) ([place]) 05/02/2018  . Recurrent pleural effusion on right 05/01/2018  . Pacemaker 04/21/2018  . 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 Hypokalemia 04/05/2018  . High cholesterol 04/05/2018  . Hypertension 04/05/2018  . Polycythemia 04/05/2018   Past Medical History:  Past Medical History:  Diagnosis Date  . Asthma   . Cancer ([place])   . COPD (chronic obstructive pulmonary disease) ([place])   . GERD (gastroesophageal reflux disease)   . High  cholesterol   . History of petit-mal seizures   . Hyperlipidemia   . Hypertension   . Memory impairment   . OSA (obstructive sleep apnea)    Does not tolerate CPAP  . Pacemaker   . PTSD (post-traumatic stress disorder)   . Sick sinus syndrome [facility])    Past Surgical History:  Past Surgical History:  Procedure Laterality Date  . CARDIAC SURGERY    . CHEST TUBE INSERTION Right 05/16/2018   Procedure: INSERTION PLEURAL DRAINAGE CATHETER;  Surgeon: Ivin Poot, MD;  Location: [facility];  Service: Thoracic;  Laterality: Right;  . CHEST TUBE INSERTION Right 05/16/2018   Procedure: Chest Tube Insertion;  Surgeon: Ivin Poot, MD;  Location: [facility];  Service: Thoracic;  Laterality: Right;  . LOWER EXTREMITY VENOGRAPHY Left 05/20/2019   Procedure: LOWER EXTREMITY VENOGRAPHY/POSSIBLE THROMBECTOMY;  Surgeon: Waynetta Sandy, MD;  Location: [facility];  Service: Cardiovascular;  Laterality: Left;  . PACEMAKER INSERTION    . PERIPHERAL VASCULAR BALLOON ANGIOPLASTY Left 05/20/2019   Procedure: PERIPHERAL VASCULAR BALLOON ANGIOPLASTY;  Surgeon: Waynetta Sandy, MD;  Location: [facility];  Service: Cardiovascular;  Laterality: Left;  Femoral, common femoral, exterrnal iliac, and common illiac  . REMOVAL OF PLEURAL DRAINAGE CATHETER Right 08/30/2018   Procedure: REMOVAL OF PLEURAL DRAINAGE CATHETER;  Surgeon: Ivin Poot, MD;  Location: [facility];  Service: Thoracic;  Laterality: Right;   HPI:  59 yo male with onset of AMS and hallucinations of bugs in the house, missed recent appointments and general confusion. PMHx:  asthma, CA to lung, fatty liver, atherosclerosis, seizures, pacemaker, PTSD, SSS, GERD, OSA, HTN, COPD, chest tube, memory changes, esophageal mets, R parietal mets. Head CT 19 mm partially calcified mass  in the right insular cortex with a   Assessment / Plan / Recommendation Clinical Impression  Pt administered parts of the [place] cognitive screen.  His orientation to situation, person and place intact. Stated the month was November and needed additional time and semantic cue. Working memory for 4 word recall impaired requiring semantic assist for 2 and choice cue for 2. Compensatory strategy of writing information discussed. Recommend therapy to facilitate memory and higher level problem solving during functional tasks (verbal > performance).     SLP Assessment  SLP Recommendation/Assessment: Patient needs continued Speech Lanaguage Pathology Services SLP Visit Diagnosis: Cognitive communication deficit (R41.841)    Follow Up Recommendations   (TBA)    Frequency and Duration min 2x/week  2 weeks      SLP Evaluation Cognition  Overall Cognitive Status: Impaired/Different from baseline (per chart notes) Arousal/Alertness: Awake/alert Orientation Level: Oriented to person;Oriented to place;Oriented to situation;Disoriented to time Attention: Sustained Sustained Attention: Appears intact Memory: Impaired Memory Impairment: Retrieval deficit Awareness: Impaired Awareness Impairment: Anticipatory impairment Problem Solving:  (to be assessed further) Safety/Judgment: Impaired       Comprehension  Auditory Comprehension Overall Auditory Comprehension: Appears within functional limits for tasks assessed Visual Recognition/Discrimination Discrimination: Not tested Reading Comprehension Reading Status: Not tested    Expression Expression Primary Mode of Expression: Verbal Verbal Expression Overall Verbal Expression: Appears within functional limits for tasks assessed Written Expression Dominant Hand: Left (says amidextrous but states most w/ left, writes left) Written Expression:  (drew clock- written lang not assessed)   Oral / Motor  Oral Motor/Sensory Function Overall Oral Motor/Sensory Function: Within functional limits Motor Speech Overall Motor Speech: Appears within functional limits for tasks  assessed Intelligibility: Intelligible Motor Planning: Witnin functional limits   GO                    Houston Siren 09/06/2019, 9:56 AM  Orbie Pyo Cydni Reddoch M.Ed Risk analyst [phone] Office [phone]

## 2019-09-07 LAB — BASIC METABOLIC PANEL
Anion gap: 11 (ref 5–15)
BUN: 25 mg/dL — ABNORMAL HIGH (ref 8–23)
CO2: 25 mmol/L (ref 22–32)
Calcium: 8 mg/dL — ABNORMAL LOW (ref 8.9–10.3)
Chloride: 101 mmol/L (ref 98–111)
Creatinine, Ser: 1.18 mg/dL (ref 0.61–1.24)
GFR calc Af Amer: >60 mL/min (ref >60)
GFR calc non Af Amer: >60 mL/min (ref >60)
Glucose, Bld: 141 mg/dL — ABNORMAL HIGH (ref 70–99)
Potassium: 3.5 mmol/L (ref 3.5–5.1)
Sodium: 137 mmol/L (ref 135–145)

## 2019-09-07 NOTE — Consult Note (Signed)
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 Started on decadron and keppra undergoing evaluation for SRS treatment of lesions and further cancer treatment.  I met with Daniel Bryant today to discuss his condition and also to see if his mental status had improved enough to engage in [place]. He has significant psychosocial barriers and limited support.   Today he is able to provide mostly coherent and clear details on his background and situation although he has very poor insight into his cancer and prognosis. He is not aware of his cognitive deficits but reports he could tell that he was having hallucinations and that they were not real. He told me his cancer was cured and insists doctors told him this-he is also abe to understand that he has cancer in his lung currently and also in his brain.   He has no living family that he is in communication with or able to contact. His parents and siblings are all deceased- his mother and brother died of lung cancer. He is divorced, no biological children. He cannot name any individual he would trust to be his medical surrogate decision maker. We discussed a variety of scenarios and the importance of having documents stating his wishes in the event his health deteriorated further. We dicussed a MOST form and he is going to think about completing this but expressed some hesitation and inability to understand the purpose of the document.  He expresses a wish to not be placed on life support or his life maintained by extreme means if there is no chance that he can live with his functional status intact or communicate. He desires full scope medical treatment at this time and time limited trial of resuscitation for a reversible condition.  He lives alone in a tiny house community funded by the [place]- he describes previously being homeless after his  mother died and struggles he has faced. He reports that he was still driving prior to this admission, but some of the details about his function are not clear and he cannot communicate who people are and what types of services he was receiving.     He is ambulatory and OOB w/o assistance. He complains of back pain, worse at night and joint aches and pains. No imaging of his back is available.  Recommendations: 1. Will continue to encourage completion of a Living Will and a MOST form. 2. PRN low dose opioid for nighttime back pain (if worsens will need work up for possible mets and possible treatment) 3. TOC communication with [facility] case worker, ?[facility] has any palliative care support programs  Lane Hacker, [place] [facility] [phone]  Time: 50- minutes Greater than 50%  of this time was spent counseling and coordinating care related to the above assessment and plan.'

## 2019-09-07 NOTE — Progress Notes (Signed)
PROGRESS NOTE  Daniel Bryant JYN:[id number] DOB: [birth year] DOA: 09/04/2019 PCP: [facility]  Brief History   59 year old male with past medical history of stage IV non-small cell lung cancer/adenocarcinoma currently on chemotherapy with last dose in March and then missed April and May appointments plus hypertension, depression and neuropathy presented to the emergency room on 6/23 complaining initially of chest pain associated with nausea and vomiting.  However, on examination, he was found to have acute kidney injury, tongue bites and acute confusion.  CT scan of the head noted metastatic lesions with midline shift, new finding for patient.  Patient started on Decadron and neurosurgery consulted.    Neurology consulted for EEG which was done which noted a sharp waves in the left parieto-occipital region, evidence of potential epileptogenic city as well as cortical dysfunction of the left posterior quadrant secondary to underlying structural abnormality plus evidence of mild to moderate diffuse encephalopathy.  Patient seen by neurosurgery with initial plans to get an MRI, but this needed to be canceled due to patient having a pacemaker.  CT scan of head with contrast is pending. Neurosurgery will discuss the patient with radiation therapy for consideration of SRS.  Consultants  Neurosurgery Oncology Neurology Procedures  . None  Antibiotics   Anti-infectives (From admission, onward)   None     Subjective  The patient is up and wandering about his room looking for his liquid to squrt on his toes. He also is asking for a razor to trim his hair with. Pt must be redirected multiple times during my visit. He is agitated and confused.   Objective   Vitals:  Vitals:   09/07/19 0844 09/07/19 1607  BP: (!) 174/101 (!) 180/97  Pulse: 83 83  Resp: 18 16  Temp: 98.6 F (37 C) (!) 97.5 F (36.4 C)  SpO2: 97% 98%   Exam:  Constitutional:  . The patient is awake, alert,  and oriented x 3. No acute distress. Respiratory:  . No increased work of breathing. . No wheezes, rales, or rhonchi . No tactile fremitus Cardiovascular:  . Regular rate and rhythm . No murmurs, ectopy, or gallups. . No lateral PMI. No thrills. Abdomen:  . Abdomen is soft, non-tender, non-distended . No hernias, masses, or organomegaly . Normoactive bowel sounds.  Musculoskeletal:  . No cyanosis, clubbing, or edema Skin:  . No rashes, lesions, ulcers . palpation of skin: no induration or nodules Neurologic:  . CN 2-12 intact . Sensation all 4 extremities intact . Motor intact . He is confused, agitated, easily distracted. Psychiatric:  . Unable to evaluate due to clinical status.  I have personally reviewed the following:   Today's Data  . Vitals, BMP  Micro Data  . Blood cultures x 2.   Imaging  . CT head  Scheduled Meds: . dexamethasone (DECADRON) injection  8 mg Intravenous Q6H  . docusate sodium  100 mg Oral BID  . famotidine  20 mg Oral Daily  . feeding supplement (PRO-STAT SUGAR FREE 64)  30 mL Oral BID  . folic acid  1 mg Oral Daily  . gabapentin  100 mg Oral QHS  . hydrALAZINE  25 mg Oral Q8H  . magic mouthwash  5 mL Oral QID  . nystatin  5 mL Oral QID  . pantoprazole  40 mg Oral BID AC  . sodium chloride flush  3 mL Intravenous Q12H  . sucralfate  1 g Oral TID WC & HS   Continuous Infusions: . lactated  ringers 100 mL/hr at 09/06/19 0849  . levETIRAcetam 500 mg (09/07/19 6553)    Principal Problem:   Non-small cell lung cancer metastatic to brain [facility]) Active Problems:   Hypokalemia   Hypertension   Pacemaker   COPD (chronic obstructive pulmonary disease) (HCC)   AKI (acute kidney injury) ([place])   DVT (deep venous thrombosis) (HCC)   Leucocytosis   Macrocytosis   Elevated SGOT (AST)   Chest pain due to GERD   Esophagitis   2.37 mm right to left midline shift   Hallucination, visual   Acute encephalopathy   Depression   Obesity (BMI  30-39.9)   LOS: 3 days   A & P  Non-small cell lung cancer new findings of metastatic to brain [facility]), presenting as hallucinations, confusion: Suspect underlying seizure activity given tongue biting and findings.  On Decadron.  EEG as above noted.  Neurosurgery seeing and will discuss the patient with radiation oncology. No MRI due to incompatible MRI in 2018. Was getting chemotherapy, but missed the last 2 sessions. This is likely secondary to his brain mets and he is not able to care for himself at this point. Neurosurgery plans to discuss the patient at tumor board on Monday. He feels that the patient would benefit from [facility].  Confusion: Secondary to brain met. Agitation likely due to steroids.   Hypokalemia: Replete. Monitor and supplement as necessary.   Hypertension: Blood pressure is elevated. The patient will be started on hydralazine. Monitor.  Pacemaker: Noted. Incompatible with MRI.  COPD (chronic obstructive pulmonary disease) ([place]): Stable. Monitor.  AKI (acute kidney injury) ([place]) in the setting of chronic kidney disease, stage I-2: Likely secondary to seizure.  Hydrating.  Creatinine started to trend downward. Creatinine 1.18 this morning. Monitor creatinine, electrolytes, and volume status. Avoid nephrotoxic substances and hypotension.  DVT (deep venous thrombosis) ([place]): Diagnosed 3 months ago.  Treated with thrombectomy and currently on therapeutic Lovenox at home.  In discussion with neurosurgery given his CNS lesion and midline shift, no anticoagulation currently.  On SCDs.  Leucocytosis: Likely stress margination and steroids.   Macrocytosis: Incidentally noted. No anemia.  Elevated SGOT (AST): Noted. Monitor.  Chest pain due to GERD/esophagitis: No evidence of cardiac ischemia at this time. Troponin stable. Continue PPI.  Depression: Noted. Continue home medications.  Obesity (BMI 30-39.9): Noted. Patient meets criteria with BMI greater than 30.  I  have seen and examined this patient myself. I have spent 30 mintues in his evaluation and care.  DVT Prophylaxis: SCD's Code Status: Full code Family Communication: No family.  Contacted friend who is on his contact list and left message. Disposition Plan: Depending on neurosurgical findings, options may range from palliative care to skilled nursing.  Strongly doubt that he will be able to go home by himself. Barrier to illness: Need to determine mode of therapy available to the patient. Palliative care has been consulted.  Status is: Inpatient  Remains inpatient appropriate because:Inpatient level of care appropriate due to severity of illness   Dispo: The patient is from: Home              Anticipated d/c is to: tbd              Anticipated d/c date is: 3 days              Patient currently is not medically stable to d/c.  Keora Eccleston, DO [facility] Direct contact: see [email or web address]  7PM-7AM contact night coverage as above 09/07/2019, 4:50  PM  LOS: 2 days

## 2019-09-07 NOTE — Progress Notes (Signed)
Subjective: Patient reports no headaches  Objective: Vital signs in last 24 hours: Temp:  [98.1 F (36.7 C)-98.6 F (37 C)] 98.6 F (37 C) (06/26 0844) Pulse Rate:  [83-90] 83 (06/26 0844) Resp:  [14-18] 18 (06/26 0844) BP: (137-174)/(83-101) 174/101 (06/26 0844) SpO2:  [96 %-97 %] 97 % (06/26 0844)  Intake/Output from previous day: No intake/output data recorded. Intake/Output this shift: Total I/O In: 3 [I.V.:3] Out: -   A+Ox3, mild left sided drift  Lab Results: Recent Labs    09/05/19 0436 09/06/19 0822  WBC 7.6 17.2*  HGB 13.2 15.0  HCT 40.6 43.5  PLT 120* 139*   BMET Recent Labs    09/06/19 0822 09/07/19 0437  NA 137 137  K 4.0 3.5  CL 97* 101  CO2 26 25  GLUCOSE 137* 141*  BUN 23 25*  CREATININE 1.48* 1.18  CALCIUM 8.7* 8.0*    Studies/Results: CT HEAD W & WO CONTRAST  Result Date: 09/05/2019 CLINICAL DATA:  Non-small-cell lung cancer. Abnormal CT. Unable to get MRI due to pacemaker EXAM: CT HEAD WITHOUT AND WITH CONTRAST TECHNIQUE: Contiguous axial images were obtained from the base of the skull through the vertex without and with intravenous contrast CONTRAST:  61mL OMNIPAQUE IOHEXOL 350 MG/ML SOLN COMPARISON:  CT head without contrast 09/04/2019, CT head 05/01/2018 FINDINGS: Brain: 19 mm mass right insular cortex. The mass has a thin calcified wall with minimal enhancement. This is unchanged from yesterday. There is a large amount of surrounding white matter vasogenic edema. Additional areas of vasogenic edema in the left occipital lobe, left parietal lobe, high right frontal lobe, and right parietal lobe unchanged from yesterday. These do not enhance but are most consistent with metastatic disease. Ventricle size is normal.  Negative for hemorrhage. Vascular: Negative for hyperdense vessel. Normal vascular enhancement. Skull: No focal skeletal lesions. Sinuses/Orbits: Mild mucosal edema paranasal sinuses. Negative orbit. Other: None IMPRESSION: 19 mm  partially calcified mass in the right insular cortex with a large amount of surrounding vasogenic edema. This shows minimal enhancement however is consistent with metastatic disease. Additional areas of vasogenic edema bilaterally also consistent with metastatic disease although these do not show abnormal enhancement. Lack of enhancement may be due to treated tumor. Electronically Signed   By: Franchot Gallo M.D.   On: 09/05/2019 20:39    Assessment/Plan: Solitary brain metastasis - will discuss at tumor board Monday-- likely [place] to his brain lesion   Vallarie Mare 09/07/2019, 10:18 AM

## 2019-09-08 NOTE — Progress Notes (Signed)
PROGRESS NOTE  Daniel Bryant WJX:[id number] DOB: 28-Sep-1960 DOA: 09/04/2019 PCP: [facility]  Brief History   59 year old male with past medical history of stage IV non-small cell lung cancer/adenocarcinoma currently on chemotherapy with last dose in March and then missed April and May appointments plus hypertension, depression and neuropathy presented to the emergency room on 6/23 complaining initially of chest pain associated with nausea and vomiting.  However, on examination, he was found to have acute kidney injury, tongue bites and acute confusion.  CT scan of the head noted metastatic lesions with midline shift, new finding for patient.  Patient started on Decadron and neurosurgery consulted.    Neurology consulted for EEG which was done which noted a sharp waves in the left parieto-occipital region, evidence of potential epileptogenic city as well as cortical dysfunction of the left posterior quadrant secondary to underlying structural abnormality plus evidence of mild to moderate diffuse encephalopathy.  Patient seen by neurosurgery with initial plans to get an MRI, but this needed to be canceled due to patient having a pacemaker.  CT scan of head with contrast is pending. Neurosurgery will discuss the patient with radiation therapy for consideration of SRS at tumor board on Monday.  Consultants  Neurosurgery Oncology Neurology Procedures  . None  Antibiotics   Anti-infectives (From admission, onward)   None     Subjective  The patient is sittign and eating breakfast. No new complaints.  Objective   Vitals:  Vitals:   09/08/19 0339 09/08/19 0815  BP: 125/88 137/86  Pulse: 65 68  Resp: 15 16  Temp: 97.6 F (36.4 C) 98.4 F (36.9 C)  SpO2: 96% 98%   Exam:  Constitutional:  . The patient is awake, alert, and oriented x 3. No acute distress. Respiratory:  . No increased work of breathing. . No wheezes, rales, or rhonchi . No tactile  fremitus Cardiovascular:  . Regular rate and rhythm . No murmurs, ectopy, or gallups. . No lateral PMI. No thrills. Abdomen:  . Abdomen is soft, non-tender, non-distended . No hernias, masses, or organomegaly . Normoactive bowel sounds.  Musculoskeletal:  . No cyanosis, clubbing, or edema Skin:  . No rashes, lesions, ulcers . palpation of skin: no induration or nodules Neurologic:  . CN 2-12 intact . Sensation all 4 extremities intact . Motor intact . He is confused, agitated, easily distracted. Psychiatric:  . Unable to evaluate due to clinical status.  I have personally reviewed the following:   Today's Data  . Vitals, BMP  Micro Data  . Blood cultures x 2.   Imaging  . CT head  Scheduled Meds: . dexamethasone (DECADRON) injection  8 mg Intravenous Q6H  . docusate sodium  100 mg Oral BID  . famotidine  20 mg Oral Daily  . feeding supplement (PRO-STAT SUGAR FREE 64)  30 mL Oral BID  . folic acid  1 mg Oral Daily  . gabapentin  100 mg Oral QHS  . hydrALAZINE  25 mg Oral Q8H  . magic mouthwash  5 mL Oral QID  . nystatin  5 mL Oral QID  . pantoprazole  40 mg Oral BID AC  . sodium chloride flush  3 mL Intravenous Q12H  . sucralfate  1 g Oral TID WC & HS   Continuous Infusions: . lactated ringers 100 mL/hr at 09/08/19 0905  . levETIRAcetam 500 mg (09/08/19 0900)    Principal Problem:   Non-small cell lung cancer metastatic to brain [facility]) Active Problems:   Hypokalemia  Hypertension   Pacemaker   COPD (chronic obstructive pulmonary disease) (HCC)   AKI (acute kidney injury) ([place])   DVT (deep venous thrombosis) (HCC)   Leucocytosis   Macrocytosis   Elevated SGOT (AST)   Chest pain due to GERD   Esophagitis   2.37 mm right to left midline shift   Hallucination, visual   Acute encephalopathy   Depression   Obesity (BMI 30-39.9)   LOS: 4 days   A & P  Non-small cell lung cancer new findings of metastatic to brain [facility]), presenting as hallucinations,  confusion: Suspect underlying seizure activity given tongue biting and findings.  On Decadron.  EEG as above noted.  Neurosurgery seeing and will discuss the patient with radiation oncology. No MRI due to incompatible MRI in 2018. Was getting chemotherapy, but missed the last 2 sessions. This is likely secondary to his brain mets and he is not able to care for himself at this point. Neurosurgery plans to discuss the patient at tumor board on Monday. He feels that the patient would benefit from [facility].  Confusion: Secondary to brain met. Agitation likely due to steroids.   Hypokalemia: Resolved. Monitor and supplement as necessary.   Hypertension: Blood pressure is elevated. The patient will be started on hydralazine. Monitor.  Pacemaker: Noted. Incompatible with MRI.  COPD (chronic obstructive pulmonary disease) ([place]): Stable. Monitor.  AKI (acute kidney injury) ([place]) in the setting of chronic kidney disease, stage I-2: Likely secondary to seizure.  Hydrating.  Creatinine started to trend downward. Creatinine 1.18 this morning. Monitor creatinine, electrolytes, and volume status. Avoid nephrotoxic substances and hypotension.  DVT (deep venous thrombosis) ([place]): Diagnosed 3 months ago.  Treated with thrombectomy and currently on therapeutic Lovenox at home.  In discussion with neurosurgery given his CNS lesion and midline shift, no anticoagulation currently.  On SCDs.  Leucocytosis: Likely stress margination and steroids.   Macrocytosis: Incidentally noted. No anemia.  Elevated SGOT (AST): Noted. Monitor.  Chest pain due to GERD/esophagitis: No evidence of cardiac ischemia at this time. Troponin stable. Continue PPI.  Depression: Noted. Continue home medications.  Obesity (BMI 30-39.9): Noted. Patient meets criteria with BMI greater than 30.  I have seen and examined this patient myself. I have spent 32 mintues in his evaluation and care.  DVT Prophylaxis: SCD'suntil plans for  intervention are clarified.  Code Status: Full code Family Communication: No family.  Contacted friend who is on his contact list and left message. Disposition Plan: Depending on neurosurgical findings, options may range from palliative care to skilled nursing.  Strongly doubt that he will be able to go home by himself. Barrier to illness: Need to determine mode of therapy available to the patient. Palliative care has been consulted.  Status is: Inpatient  Remains inpatient appropriate because:Inpatient level of care appropriate due to severity of illness   Dispo: The patient is from: Home              Anticipated d/c is to: tbd              Anticipated d/c date is: 3 days              Patient currently is not medically stable to d/c.  Trameka Dorough, DO [facility] Direct contact: see [email or web address]  7PM-7AM contact night coverage as above 09/08/2019, 5:00 PM  LOS: 2 days

## 2019-09-09 ENCOUNTER — Ambulatory Visit
Admit: 2019-09-09 | Discharge: 2019-09-09 | Disposition: A | Discharge status: Home / Self Care | Payer: No Typology Code available for payment source | Attending: Radiation Oncology | Admitting: Radiation Oncology

## 2019-09-09 DIAGNOSIS — F329 Major depressive disorder, single episode, unspecified: Secondary | ICD-10-CM

## 2019-09-09 DIAGNOSIS — K219 Gastro-esophageal reflux disease without esophagitis: Secondary | ICD-10-CM

## 2019-09-09 DIAGNOSIS — C3490 Malignant neoplasm of unspecified part of unspecified bronchus or lung: Secondary | ICD-10-CM

## 2019-09-09 DIAGNOSIS — R079 Chest pain, unspecified: Secondary | ICD-10-CM

## 2019-09-09 DIAGNOSIS — I82402 Acute embolism and thrombosis of unspecified deep veins of left lower extremity: Secondary | ICD-10-CM

## 2019-09-09 DIAGNOSIS — C3491 Malignant neoplasm of unspecified part of right bronchus or lung: Secondary | ICD-10-CM

## 2019-09-09 DIAGNOSIS — J449 Chronic obstructive pulmonary disease, unspecified: Secondary | ICD-10-CM

## 2019-09-09 DIAGNOSIS — I10 Essential (primary) hypertension: Secondary | ICD-10-CM

## 2019-09-09 DIAGNOSIS — Z950 Presence of cardiac pacemaker: Secondary | ICD-10-CM

## 2019-09-09 LAB — CBC WITH DIFFERENTIAL/PLATELET
Abs Immature Granulocytes: 0.1 10*3/uL — ABNORMAL HIGH (ref 0.00–0.07)
Basophils Absolute: 0 10*3/uL (ref 0.0–0.1)
Basophils Relative: 0 %
Eosinophils Absolute: 0 10*3/uL (ref 0.0–0.5)
Eosinophils Relative: 0 %
HCT: 38.5 % — ABNORMAL LOW (ref 39.0–52.0)
Hemoglobin: 13.2 g/dL (ref 13.0–17.0)
Immature Granulocytes: 1 %
Lymphocytes Relative: 5 %
Lymphs Abs: 0.5 10*3/uL — ABNORMAL LOW (ref 0.7–4.0)
MCH: 35 pg — ABNORMAL HIGH (ref 26.0–34.0)
MCHC: 34.3 g/dL (ref 30.0–36.0)
MCV: 102.1 fL — ABNORMAL HIGH (ref 80.0–100.0)
Monocytes Absolute: 0.8 10*3/uL (ref 0.1–1.0)
Monocytes Relative: 7 %
Neutro Abs: 9.5 10*3/uL — ABNORMAL HIGH (ref 1.7–7.7)
Neutrophils Relative %: 87 %
Platelets: 122 10*3/uL — ABNORMAL LOW (ref 150–400)
RBC: 3.77 MIL/uL — ABNORMAL LOW (ref 4.22–5.81)
RDW: 12.7 % (ref 11.5–15.5)
WBC: 10.9 10*3/uL — ABNORMAL HIGH (ref 4.0–10.5)
nRBC: 0 % (ref 0.0–0.2)

## 2019-09-09 LAB — BASIC METABOLIC PANEL
Anion gap: 8 (ref 5–15)
BUN: 22 mg/dL (ref 8–23)
CO2: 28 mmol/L (ref 22–32)
Calcium: 8 mg/dL — ABNORMAL LOW (ref 8.9–10.3)
Chloride: 102 mmol/L (ref 98–111)
Creatinine, Ser: 1.1 mg/dL (ref 0.61–1.24)
GFR calc Af Amer: >60 mL/min (ref >60)
GFR calc non Af Amer: >60 mL/min (ref >60)
Glucose, Bld: 151 mg/dL — ABNORMAL HIGH (ref 70–99)
Potassium: 3.2 mmol/L — ABNORMAL LOW (ref 3.5–5.1)
Sodium: 138 mmol/L (ref 135–145)

## 2019-09-09 MED ORDER — FLUCONAZOLE 100 MG PO TABS
100.0000 mg | ORAL_TABLET | Freq: Every day | ORAL | Status: DC
  Administered 2019-09-09 – 2019-09-16 (×8): 100 mg via ORAL
  Filled 2019-09-09 (×8): qty 1

## 2019-09-09 MED ORDER — PHENOL 1.4 % MT LIQD
1.0000 | OROMUCOSAL | Status: DC | PRN
  Administered 2019-09-09 – 2019-09-14 (×3): 1 via OROMUCOSAL
  Filled 2019-09-09: qty 177

## 2019-09-09 MED ORDER — MAGIC MOUTHWASH W/LIDOCAINE
5.0000 mL | Freq: Three times a day (TID) | ORAL | Status: DC | PRN
  Administered 2019-09-09: 5 mL via ORAL
  Filled 2019-09-09 (×2): qty 5

## 2019-09-09 MED ORDER — OXYCODONE HCL 5 MG PO TABS
5.0000 mg | ORAL_TABLET | Freq: Once | ORAL | Status: AC
  Administered 2019-09-09: 5 mg via ORAL
  Filled 2019-09-09: qty 1

## 2019-09-09 MED ORDER — ZOLPIDEM TARTRATE 5 MG PO TABS
5.0000 mg | ORAL_TABLET | Freq: Once | ORAL | Status: AC
  Administered 2019-09-09: 5 mg via ORAL
  Filled 2019-09-09: qty 1

## 2019-09-09 NOTE — Progress Notes (Signed)
Neurosurgery  Daniel Bryant case was discussed at interdisciplinary tumor board.  Given the presence of numerous lesions, many of which cannot be visualized on his CT, we are recommending whole brain radiation.  He will be set up with radiation oncology for this.  SRS can be used as salvage therapy as necessary.

## 2019-09-09 NOTE — Progress Notes (Signed)
  Speech Language Pathology Treatment: Cognitive-Linquistic  Patient Details Name: Daniel Bryant MRN: [id number] DOB: June 04, 1960 Today's Date: 09/09/2019 Time: 1202-1230 SLP Time Calculation (min) (ACUTE ONLY): 28 min  Assessment / Plan / Recommendation Clinical Impression  Skilled treatment with pt given various higher level cognitive tasks related to functional tasks within environment (ie: money management, STM tasks) with min-mod verbal/visual cues required for memory tasks with 75% accuracy gained; compensatory strategies utilized for memory including: retention of numbers (when calling nutritional services for lunch), planners, calendars to utilize within hospital/home); decreased awareness noted with pt's current driving status d/t recent cancer dx and needs required during radiation tx/progression of cancer; problem solving related to needs with 80% accuracy and max cues provided; ST will continue efforts while in acute care.     HPI HPI: 59 yo male with onset of AMS and hallucinations of bugs in the house, missed recent appointments and general confusion. PMHx:  asthma, CA to lung, fatty liver, atherosclerosis, seizures, pacemaker, PTSD, SSS, GERD, OSA, HTN, COPD, chest tube, memory changes, esophageal mets, R parietal mets. Head CT 19 mm partially calcified mass in the right insular cortex       SLP Plan  Continue with current plan of care       Recommendations   Continue current plan of care re: cognitive tx                General recommendations: Other(comment) (TBD) Follow up Recommendations: Other (comment) (TBD) SLP Visit Diagnosis: Cognitive communication deficit (U88.280) Plan: Continue with current plan of care                      Elvina Sidle, M.S., [place] 09/09/2019, 2:12 PM

## 2019-09-09 NOTE — Consult Note (Signed)
Radiation Oncology         [phone]) [phone] ________________________________  Name: Daniel Bryant        MRN: [id number]  Date of Service: 09/09/19 DOB: 09-Jul-1960  HC:WCBJSE, [place]    REFERRING PHYSICIAN: Dr. Duffy Rhody  DIAGNOSIS: The primary encounter diagnosis was Chest pain, unspecified type. Diagnoses of AKI (acute kidney injury) ([place]), Hallucinations, Lung cancer metastatic to brain [facility]), and Vasogenic brain edema ([place]) were also pertinent to this visit.   HISTORY OF PRESENT ILLNESS: Daniel Bryant is a 59 y.o. male seen at the request of Dr. Marcello Moores for newly noted metastatic lesion of the brain in the setting of a history of stage IV non-small cell lung cancer, adenocarcinoma of the right upper lobe.  Apparently the patient was diagnosed with his cancer in January 2020.  He received systemic carboplatin with Alimta, and Keytruda.  He had significant skin reactions, and there were dose modifications made to his regimen.  Apparently he showed disease progression in January 2021, he had multiple missed appointments, was hospitalized for DVT, and ultimately resumed systemic chemotherapy in March 2021.  He has no shown for several appointments since.  He presented to the hospital on 09/04/2019 with shortness of breath, and altered mental status.  A CT scan without contrast of the head revealed a 1.9 x 1.6 cm lobular well-defined lesion in the right parietal lobe with moderate to marked surrounding vasogenic edema.  Small to moderate sized areas of white matter low-attenuation were also seen in the right frontal, right posterior parietal, and left occipital lobes he had a 2.37 mm right to left midline shift, and benign maxillary sinus polyps.  Repeat CT of the brain with and without contrast was performed as the patient cannot undergo MRI due to did not MRI incompatible pacemaker.  He reports having had an MRI but cannot recall how long ago it was but believes it was prior to the  most recent intervention in 2017.  The scan on 09/05/2019 revealed a 19 mm mass with in the right insular cortex with thin calcified wall and minimal enhancement which was unchanged from his CT the day prior.  There was a large amount of surrounding white matter vasogenic edema, additional additional areas of vasogenic edema in the left occipital, left parietal, left frontal and right parietal lobe were also noted nonenhancing but most consistent with metastatic disease.  His case was reviewed in conference, and unfortunately given the multifocal concerns for disease, he would be a better candidate for whole brain radiotherapy rather than stereotactic procedures.  He is contacted to discuss these options.   PREVIOUS RADIATION THERAPY: No   PAST MEDICAL HISTORY:  Past Medical History:  Diagnosis Date  . Asthma   . Cancer ([place])   . COPD (chronic obstructive pulmonary disease) ([place])   . GERD (gastroesophageal reflux disease)   . High cholesterol   . History of petit-mal seizures   . Hyperlipidemia   . Hypertension   . Memory impairment   . OSA (obstructive sleep apnea)    Does not tolerate CPAP  . Pacemaker   . PTSD (post-traumatic stress disorder)   . Sick sinus syndrome ([place])        PAST SURGICAL HISTORY: Past Surgical History:  Procedure Laterality Date  . CARDIAC SURGERY    . CHEST TUBE INSERTION Right 05/16/2018   Procedure: INSERTION PLEURAL DRAINAGE CATHETER;  Surgeon: Ivin Poot, MD;  Location: [place];  Service: Thoracic;  Laterality: Right;  .  CHEST TUBE INSERTION Right 05/16/2018   Procedure: Chest Tube Insertion;  Surgeon: Ivin Poot, MD;  Location: [place];  Service: Thoracic;  Laterality: Right;  . LOWER EXTREMITY VENOGRAPHY Left 05/20/2019   Procedure: LOWER EXTREMITY VENOGRAPHY/POSSIBLE THROMBECTOMY;  Surgeon: Waynetta Sandy, MD;  Location: [place] [facility];  Service: Cardiovascular;  Laterality: Left;  . PACEMAKER INSERTION    . PERIPHERAL VASCULAR  BALLOON ANGIOPLASTY Left 05/20/2019   Procedure: PERIPHERAL VASCULAR BALLOON ANGIOPLASTY;  Surgeon: Waynetta Sandy, MD;  Location: [place] [facility];  Service: Cardiovascular;  Laterality: Left;  Femoral, common femoral, exterrnal iliac, and common illiac  . REMOVAL OF PLEURAL DRAINAGE CATHETER Right 08/30/2018   Procedure: REMOVAL OF PLEURAL DRAINAGE CATHETER;  Surgeon: Ivin Poot, MD;  Location: [place];  Service: Thoracic;  Laterality: Right;     FAMILY HISTORY:  Family History  Problem Relation Age of Onset  . Alzheimer's disease Mother   . Cancer Mother   . Memory loss Father      SOCIAL HISTORY:  reports that he has quit smoking. His smoking use included e-cigarettes. His smokeless tobacco use includes chew. He reports previous alcohol use. He reports that he does not use drugs.   ALLERGIES: Other   MEDICATIONS:  Current Facility-Administered Medications  Medication Dose Route Frequency Provider Last Rate Last Admin  . alum & mag hydroxide-simeth (MAALOX/MYLANTA) 200-200-20 MG/5ML suspension 30 mL  30 mL Oral Q4H PRN Annita Brod, MD   30 mL at 09/08/19 2259  . dexamethasone (DECADRON) injection 8 mg  8 mg Intravenous Q6H Lavina Hamman, MD   8 mg at 09/09/19 1249  . docusate sodium (COLACE) capsule 100 mg  100 mg Oral BID Lavina Hamman, MD   100 mg at 09/08/19 2257  . famotidine (PEPCID) tablet 20 mg  20 mg Oral Daily Lavina Hamman, MD   20 mg at 09/09/19 0854  . feeding supplement (PRO-STAT SUGAR FREE 64) liquid 30 mL  30 mL Oral BID Lavina Hamman, MD   30 mL at 09/09/19 0855  . fluconazole (DIFLUCAN) tablet 100 mg  100 mg Oral Daily Gu[place]ford Shi, MD   100 mg at 09/09/19 1246  . folic acid (FOLVITE) tablet 1 mg  1 mg Oral Daily Lavina Hamman, MD   1 mg at 09/09/19 0854  . gabapentin (NEURONTIN) capsule 100 mg  100 mg Oral QHS Lavina Hamman, MD   100 mg at 09/08/19 2257  . hydrALAZINE (APRESOLINE) tablet 25 mg  25 mg Oral Q8H Swayze, Ava, DO    25 mg at 09/09/19 1425  . lactated ringers infusion   Intravenous Continuous Lavina Hamman, MD 100 mL/hr at 09/09/19 0700 New Bag at 09/09/19 0700  . levETIRAcetam (KEPPRA) IVPB 500 mg/100 mL premix  500 mg Intravenous Q12H Lavina Hamman, MD 400 mL/hr at 09/09/19 0905 500 mg at 09/09/19 0905  . magic mouthwash  5 mL Oral QID Lavina Hamman, MD   5 mL at 09/09/19 1429  . nystatin (MYCOSTATIN) 100000 UNIT/ML suspension 500,000 Units  5 mL Oral QID Lavina Hamman, MD   500,000 Units at 09/09/19 1429  . ondansetron (ZOFRAN) tablet 4 mg  4 mg Oral Q6H PRN Lavina Hamman, MD       Or  . ondansetron [facility]) injection 4 mg  4 mg Intravenous Q6H PRN Lavina Hamman, MD   4 mg at 09/06/19 0005  . pantoprazole (PROTONIX) EC tablet 40  mg  40 mg Oral BID AC Lavina Hamman, MD   40 mg at 09/09/19 0854  . sodium chloride flush (NS) 0.9 % injection 3 mL  3 mL Intravenous Q12H Lavina Hamman, MD   3 mL at 09/09/19 0911  . sucralfate (CARAFATE) 1 GM/10ML suspension 1 g  1 g Oral TID WC & HS Lavina Hamman, MD   1 g at 09/09/19 1246  . traMADol (ULTRAM) tablet 50 mg  50 mg Oral Q8H PRN Lavina Hamman, MD   50 mg at 09/09/19 9562     REVIEW OF SYSTEMS: On review of systems, the patient reports that he was unaware that he had cancer and states " you mean those  quacks told me that I did not have cancer anymore and they were lying?"  He denies any headaches, visual deficits, or movement difficulties.  No other complaints are verbalized.     PHYSICAL EXAM:  Wt Readings from Last 3 Encounters:  09/09/19 224 lb 13.9 oz (102 kg)  06/19/19 223 lb 8 oz (101.4 kg)  06/06/19 227 lb 1.6 oz (103 kg)   Temp Readings from Last 3 Encounters:  09/09/19 98.3 F (36.8 C)  06/19/19 97.8 F (36.6 C) (Temporal)  06/06/19 98.2 F (36.8 C) (Oral)   BP Readings from Last 3 Encounters:  09/09/19 134/84  06/19/19 (!) 159/103  06/06/19 (!) 157/98   Pulse Readings from Last 3 Encounters:  09/09/19 70  06/19/19  96  06/06/19 82  Unable to assess given encounter type.  ECOG = 2  0 - Asymptomatic (Fully active, able to carry on all predisease activities without restriction)  1 - Symptomatic but completely ambulatory (Restricted in physically strenuous activity but ambulatory and able to carry out work of a light or sedentary nature. For example, light housework, office work)  2 - Symptomatic, <50% in bed during the day (Ambulatory and capable of all self care but unable to carry out any work activities. Up and about more than 50% of waking hours)  3 - Symptomatic, >50% in bed, but not bedbound (Capable of only limited self-care, confined to bed or chair 50% or more of waking hours)  4 - Bedbound (Completely disabled. Cannot carry on any self-care. Totally confined to bed or chair)  5 - Death   Eustace Pen MM, Creech RH, Tormey DC, et al. [phone]). "Toxicity and response criteria of the [facility]". [place] Oncol. 5 (6): 649-55    LABORATORY DATA:  Lab Results  Component Value Date   WBC 10.9 (H) 09/09/2019   HGB 13.2 09/09/2019   HCT 38.5 (L) 09/09/2019   MCV 102.1 (H) 09/09/2019   PLT 122 (L) 09/09/2019   Lab Results  Component Value Date   NA 138 09/09/2019   K 3.2 (L) 09/09/2019   CL 102 09/09/2019   CO2 28 09/09/2019   Lab Results  Component Value Date   ALT 28 09/06/2019   AST 43 (H) 09/06/2019   ALKPHOS 78 09/06/2019   BILITOT 0.8 09/06/2019      RADIOGRAPHY: DG Chest 2 View  Result Date: 09/04/2019 CLINICAL DATA:  Chest pain since last night. Additional history obtained from prior radiology records: Stage IV lung cancer. EXAM: CHEST - 2 VIEW COMPARISON:  Chest CT 04/04/2019, chest radiograph 11/05/2018 FINDINGS: Redemonstrated right chest dual lead pacer device. Heart size within normal limits. There is ill-defined and nodular opacities within the mid to lower right lung field, as well as  right pleural thickening, consistent with metastatic disease  within the right lung and right pleural space demonstrated on prior chest CT 04/03/2018. Superimposed infection at this site is difficult to exclude. The left lung is grossly clear. No left-sided pleural effusion. No evidence of pneumothorax. No acute bony abnormality identified. IMPRESSION: Ill-defined and nodular opacities within the mid to lower right lung field, as well as right pleural thickening, consistent with metastatic disease within the right lung and right pleural space demonstrated on prior chest CT 04/03/2018. Superimposed infection at this site is difficult to exclude radiographically. The left lung remains grossly clear. Electronically Signed   By: Kellie Simmering DO   On: 09/04/2019 13:21   CT Head Wo Contrast  Result Date: 09/04/2019 CLINICAL DATA:  Altered mental status. EXAM: CT HEAD WITHOUT CONTRAST TECHNIQUE: Contiguous axial images were obtained from the base of the skull through the vertex without intravenous contrast. COMPARISON:  May 01, 2018 FINDINGS: Brain: There is mild cerebral atrophy with widening of the extra-axial spaces and ventricular dilatation. There are areas of decreased attenuation within the white matter tracts of the supratentorial brain, consistent with microvascular disease changes. A 1.9 cm x 1.6 cm lobulated well-defined low-attenuation lesion, with thin surrounding hyperdense rim, is seen within the right parietal lobe. This represents a new finding when compared to the prior study. A moderate to marked amount of surrounding white matter low attenuation is seen within the right parietal and right temporal lobes, consistent with vasogenic edema. A mild amount of mass effect is seen on the adjacent sulci. Approximately 2.37 mm right to left midline shift is seen. Small to moderate sized areas of white matter low attenuation are seen within the right frontal, right posterior parietal and left occipital lobes. These are new when compared to the prior study. Vascular:  No hyperdense vessels are identified. Skull: Normal. Negative for fracture or focal lesion. Sinuses/Orbits: Multiple lobulated bilateral maxillary sinus polyps versus mucous retention cysts are seen. Other: None. IMPRESSION: 1. 1.9 cm x 1.6 cm lobulated well-defined low-attenuation lesion, with thin surrounding hyperdense rim, within the right parietal lobe, with a moderate to marked amount of surrounding vasogenic edema. This represents a new finding when compared to the prior study and is worrisome for the presence of an underlying neoplasm. MRI correlation is recommended. 2. Small to moderate sized areas of white matter low attenuation are seen within the right frontal, right posterior parietal and left occipital lobes. These are new when compared to the prior study and are concerning for additional areas of vasogenic edema versus white matter ischemia. MRI correlation is recommended. 3. Approximately 2.37 mm right to left midline shift. 4. Multiple lobulated bilateral maxillary sinus polyps versus mucous retention cysts. Electronically Signed   By: Virgina Norfolk M.D.   On: 09/04/2019 17:51   CT HEAD W & WO CONTRAST  Result Date: 09/05/2019 CLINICAL DATA:  Non-small-cell lung cancer. Abnormal CT. Unable to get MRI due to pacemaker EXAM: CT HEAD WITHOUT AND WITH CONTRAST TECHNIQUE: Contiguous axial images were obtained from the base of the skull through the vertex without and with intravenous contrast CONTRAST:  79mL OMNIPAQUE IOHEXOL 350 MG/ML SOLN COMPARISON:  CT head without contrast 09/04/2019, CT head 05/01/2018 FINDINGS: Brain: 19 mm mass right insular cortex. The mass has a thin calcified wall with minimal enhancement. This is unchanged from yesterday. There is a large amount of surrounding white matter vasogenic edema. Additional areas of vasogenic edema in the left occipital lobe, left parietal lobe, high right  frontal lobe, and right parietal lobe unchanged from yesterday. These do not enhance  but are most consistent with metastatic disease. Ventricle size is normal.  Negative for hemorrhage. Vascular: Negative for hyperdense vessel. Normal vascular enhancement. Skull: No focal skeletal lesions. Sinuses/Orbits: Mild mucosal edema paranasal sinuses. Negative orbit. Other: None IMPRESSION: 19 mm partially calcified mass in the right insular cortex with a large amount of surrounding vasogenic edema. This shows minimal enhancement however is consistent with metastatic disease. Additional areas of vasogenic edema bilaterally also consistent with metastatic disease although these do not show abnormal enhancement. Lack of enhancement may be due to treated tumor. Electronically Signed   By: Franchot Gallo M.D.   On: 09/05/2019 20:39   CT Chest Wo Contrast  Result Date: 09/04/2019 CLINICAL DATA:  59 year old male with chest pain and shortness of breath. Metastatic lung cancer. EXAM: CT CHEST WITHOUT CONTRAST TECHNIQUE: Multidetector CT imaging of the chest was performed following the standard protocol without IV contrast. COMPARISON:  Chest CT dated 04/04/2019. FINDINGS: Evaluation of this exam is limited in the absence of intravenous contrast. Cardiovascular: There is no cardiomegaly or pericardial effusion. Right pectoral dual lead pacemaker device. The thoracic aorta and central pulmonary arteries are grossly unremarkable. Coronary vascular calcifications of the LAD. Mediastinum/Nodes: Similar or minimally increased right hilar and mediastinal adenopathy. For example a right hilar lymph node measuring 19 mm previously measured 17 mm and a lymph node anterior to the trachea measuring 19 mm in short axis previously measured 15 mm. There is circumferential thickening of the distal esophagus which may represent esophagitis or infiltration of the esophagus by malignancy. No mediastinal fluid collection. Lungs/Pleura: Diffusely thickened and lobular right pleural consistent with metastatic disease. Overall  similar or slight increased in the pleural thickening since the prior CT. There is a 6.6 x 5.0 cm (previously 6.1 x 4.7 cm) ovoid mass at the right cardiophrenic angle. There is probable trace right pleural effusion. There is diffuse interstitial and interlobular septal prominence of the right lung base which may represent edema or lymphangitic carcinomatosis. Several nodular densities at the right lung base similar to prior CT consistent with metastatic disease. The left lung is clear. No pneumothorax. The central airways are patent. Upper Abdomen: Fatty liver. A 2 cm hypodense lesion in the right lobe of the liver similar to prior CT is not characterized but demonstrates fluid attenuation, likely a cyst. Partially visualized 6 mm nodular density in the left upper abdomen (162/5). Musculoskeletal: No acute osseous pathology. No obvious metastatic bone lesions. There is a 1 cm nodular density in the right lateral chest wall (139/5) similar to prior CT and most consistent with a metastatic disease or a mildly enlarged lymph node. IMPRESSION: 1. Similar or minimally progressed metastatic disease in the right lung, right pleural and hilar/mediastinal lymph node. 2. Circumferential thickening of the distal esophagus may represent esophagitis or infiltration of the esophagus by malignancy. 3. Fatty liver. 4. Partially visualized 6 mm nodular density in the left upper abdomen. 5. Aortic Atherosclerosis (ICD10-I70.0). Electronically Signed   By: Anner Crete M.D.   On: 09/04/2019 18:03   EEG adult  Result Date: 09/05/2019 Lora Havens, MD     09/05/2019  2:11 PM Patient Name: Hooper Petteway MRN: [id number] Epilepsy Attending: Lora Havens Referring Physician/Provider: Dr Berle Mull Date: 09/05/2019 Duration: 26.16 mins Patient history: 59 year old male presented to the ED last night after having hallucinations for a couple days. CT head revealed a 1.9x1.6cm lesion in  the right parietal lobe with  surrounding vasogenic edema.  EEG to evaluate for seizure Level of alertness: Lethargic AEDs during EEG study: LEV Technical aspects: This EEG study was done with scalp electrodes positioned according to the 10-20 International system of electrode placement. Electrical activity was acquired at a sampling rate of 500Hz  and reviewed with a high frequency filter of 70Hz  and a low frequency filter of 1Hz . EEG data were recorded continuously and digitally stored. Description: No clear posterior dominant rhythm was seen. Sharp waves were seen in left parieto-occipital region at times periodic at 0.25Hz . EEG showed continuous generalized 6-9Hz  theta-alpha activity as well as 3-5Hz  theta-delta slowing in left posterior quadrant.  Hyperventilation and photic stimulation were not performed.   ABNORMALITY - Sharp wave, left parieto- occipital region -Continuous slow, generalized and maximal left posterior quadrant IMPRESSION: This study showed evidence of potential epileptogenicity as well as cortical dysfunction in left posterior quadrant secondary to underlying structural abnormality. Additionally, there is evidence of mild to moderate diffuse encephalopathy, non specific to etiology. No seizures were seen throughout the recording. Priyanka Barbra Sarks       IMPRESSION/PLAN: 1. Progressive Stage IV, [phone], NSCLC, adenocarcinoma of the RUL with solitary brain metastasis. I spent time with the patient reviewing the findings from his imaging, and the discussion from conference this morning.  Unfortunately it appears that the best suited treatment would require whole brain radiotherapy rather than stereotactic procedures.  We discussed the risks, benefits, short and long-term effects of radiotherapy, and the patient is interested in proceeding.  Since he is at [facility], we will try and facilitate [facility] transfer to our department tomorrow.  I will also reach out to his team through [facility] to see if he can stay at  [facility].  We were anticipating starting his treatment in the next few days.  He will sign written consent tomorrow at simulation.  We will also notify oncology that he was hospitalized as there appears that he was misunderstanding his course and the need for therapy. 2. Social constraints.  The patient has been speaking with palliative medicine and social work. We will follow-up with this expectantly.  In a visit lasting 60 minutes, greater than 50% of the time was spent by phone and in floor time discussing the patient's condition, in preparation for the discussion, and coordinating the patient's care.     Carola Rhine, PAC

## 2019-09-09 NOTE — Progress Notes (Signed)
Spoke to oncall provider for orders regarding pain, sleep, medication for oral pain and questioned the need to DC tele monitoring orders. See orders

## 2019-09-09 NOTE — Progress Notes (Signed)
Spoke with the patient's nurse Tammy to let her know that he is scheduled for a radiation planning session tomorrow 6/29 at the [facility] at 2:30 pm.  I asked that she call [facility] to set up transportation to have him here at 2:00 pm.  [facility]'s number was provided.  Will continue to follow as necessary.  Gloriajean Dell. Leonie Green, BSN

## 2019-09-09 NOTE — Progress Notes (Signed)
Set up transportation with [facility] for pt to go over to [place] at [facility] tomorrow for a 2pm  (1400) appointment.  Informed this will be his planning session for radiation treatments.   Spoke with Ruby at [employer], she took information and has pt set up to be picked up at 1330 here at [employer], [place] room 28, 2W28.

## 2019-09-09 NOTE — Progress Notes (Addendum)
PROGRESS NOTE    Algie Westry  WNI:[id number]  DOB: 12/28/1960  PCP: [facility] 59 year [facility] male with past medical history of stage IV non-small cell lung cancer/adenocarcinoma currently on chemotherapy with last dose in March and then missed April and May appointments plus hypertension, depression and neuropathy presented to the emergency room on 6/23 complaining initially of chest pain associated with nausea and vomiting. However, on examination, he was found to have acute kidney injury, tongue bites and acute confusion. CT scan of the head noted metastatic lesions with midline shift, new finding for patient. Patient started on Decadron and neurosurgery consulted.  Hospital course:Neurology consulted for EEG which was done which noted a sharp waves in the left parieto-occipital region, evidence of potential epileptogenic city as well as cortical dysfunction of the left posterior quadrant secondary to underlying structural abnormality plus evidence of mild to moderate diffuse encephalopathy. Patient seen by neurosurgery with initial plans to get an MRI, but this needed to be canceled due to patient having a pacemaker. CT scan of head with contrast obtained 6/24 which shows numerous lesions, many of which cannot be visualized on his CT,19 mm partially calcified mass in the right insular cortex with a large amount of surrounding vasogenic edema. ->NS recommending whole brain radiation.  He will be set up with radiation oncology for this.  SRS can be used as salvage therapy as necessary.  Subjective:  Patient sitting comfortably on edge of the bed and talking to dietitian at bedside.  Denies any headache or vision problems.  Denies any nausea or vomiting.  Denies any seizure activity He does report oral discomfort/pain .    Objective: Vitals:   09/08/19 2254 09/09/19 0500 09/09/19 0659 09/09/19 0807  BP: 140/77  (!) 179/97 (!) 147/91  Pulse: 80  70 88  Resp: 18     Temp:  98.5 F (36.9 C)   (!) 97.5 F (36.4 C)  TempSrc: Oral     SpO2: 98%   98%  Weight:  102 kg    Height:        Intake/Output Summary (Last 24 hours) at 09/09/2019 0903 Last data filed at 09/09/2019 0700 Gross per 24 hour  Intake 2020 ml  Output --  Net 2020 ml   Filed Weights   09/04/19 1126 09/08/19 0516 09/09/19 0500  Weight: 101.4 kg 101.3 kg 102 kg    Physical Examination:  General: Moderately built, no acute distress noted Head ENT: Atraumatic normocephalic, PERRLA, neck supple.  Noted to have candidal lesions along bucca mucosa and lateral aspects of the tongue. Heart: S1-S2 heard, regular rate and rhythm, no murmurs.  No leg edema noted.  S/p pacemaker Lungs: Equal air entry bilaterally, no rhonchi or rales on exam, no accessory muscle use Abdomen: Bowel sounds heard, soft, nontender, nondistended. No organomegaly.  No CVA tenderness Extremities: No pedal edema.  No cyanosis or clubbing. Neurological: Awake alert oriented x3, no focal weakness or numbness, strength and sensations to crude touch intact Psychiatric: Judgment and insight appear within normal limits.  Normal mood. Skin: No wounds or rashes.     Data Reviewed: I have personally reviewed following labs and imaging studies  CBC: Recent Labs  Lab 09/04/19 1200 09/05/19 0436 09/06/19 0822 09/09/19 0359  WBC 13.7* 7.6 17.2* 10.9*  NEUTROABS 9.3*  --   --  9.5*  HGB 15.7 13.2 15.0 13.2  HCT 46.2 40.6 43.5 38.5*  MCV 103.6* 109.1* 104.1* 102.1*  PLT 142* 120* 139* 122*  Basic Metabolic Panel: Recent Labs  Lab 09/04/19 1200 09/04/19 2005 09/05/19 0436 09/06/19 0822 09/07/19 0437 09/09/19 0359  NA 139  --  138 137 137 138  K 3.1*  --  4.1 4.0 3.5 3.2*  CL 99  --  101 97* 101 102  CO2 25  --  26 26 25 28   GLUCOSE 107*  --  136* 137* 141* 151*  BUN 15  --  18 23 25* 22  CREATININE 2.12*  --  1.90* 1.48* 1.18 1.10  CALCIUM 9.2  --  8.6* 8.7* 8.0* 8.0*  MG  --  1.3*  --   --   --   --     GFR: Estimated Creatinine Clearance: 87.2 mL/min (by C-G formula based on SCr of 1.1 mg/dL). Liver Function Tests: Recent Labs  Lab 09/04/19 1200 09/05/19 0436 09/06/19 0822  AST 63* 48* 43*  ALT 26 24 28   ALKPHOS 85 67 78  BILITOT 1.1 1.0 0.8  PROT 8.1 6.9 7.8  ALBUMIN 3.5 3.1* 3.5   Recent Labs  Lab 09/04/19 1200  LIPASE 24   Recent Labs  Lab 09/04/19 1945  AMMONIA 24   Coagulation Profile: Recent Labs  Lab 09/04/19 2005  INR 1.2   Cardiac Enzymes: No results for input(s): CKTOTAL, CKMB, CKMBINDEX, TROPONINI in the last 168 hours. BNP (last 3 results) No results for input(s): PROBNP in the last 8760 hours. HbA1C: No results for input(s): HGBA1C in the last 72 hours. CBG: No results for input(s): GLUCAP in the last 168 hours. Lipid Profile: No results for input(s): CHOL, HDL, LDLCALC, TRIG, CHOLHDL, LDLDIRECT in the last 72 hours. Thyroid Function Tests: No results for input(s): TSH, T4TOTAL, FREET4, T3FREE, THYROIDAB in the last 72 hours. Anemia Panel: No results for input(s): VITAMINB12, FOLATE, FERRITIN, TIBC, IRON, RETICCTPCT in the last 72 hours. Sepsis Labs: No results for input(s): PROCALCITON, LATICACIDVEN in the last 168 hours.  Recent Results (from the past 240 hour(s))  Culture, blood (routine x 2)     Status: None (Preliminary result)   Collection Time: 09/04/19  9:40 PM   Specimen: BLOOD  Result Value Ref Range Status   Specimen Description BLOOD RIGHT ANTECUBITAL  Final   Special Requests   Final    BOTTLES DRAWN AEROBIC AND ANAEROBIC Blood Culture adequate volume   Culture   Final    NO GROWTH 3 DAYS Performed at [facility], [address], [place], [place] [address]    Report Status PENDING  Incomplete  SARS Coronavirus 2 by RT PCR (hospital order, performed in [facility] hospital lab) Nasopharyngeal Nasopharyngeal Swab     Status: None   Collection Time: 09/04/19 10:04 PM   Specimen: Nasopharyngeal Swab  Result Value Ref  Range Status   SARS Coronavirus 2 NEGATIVE NEGATIVE Final    Comment: (NOTE) SARS-CoV-2 target nucleic acids are NOT DETECTED.  The SARS-CoV-2 RNA is generally detectable in upper and lower respiratory specimens during the acute phase of infection. The lowest concentration of SARS-CoV-2 viral copies this assay can detect is 250 copies / mL. A negative result does not preclude SARS-CoV-2 infection and should not be used as the sole basis for treatment or other patient management decisions.  A negative result may occur with improper specimen collection / handling, submission of specimen other than nasopharyngeal swab, presence of viral mutation(s) within the areas targeted by this assay, and inadequate number of viral copies (<250 copies / mL). A negative result must be combined  with clinical observations, patient history, and epidemiological information.  Fact Sheet for Patients:   StrictlyIdeas.no  Fact Sheet for Healthcare Providers: BankingDealers.co.za  This test is not yet approved or  cleared by the [place] FDA and has been authorized for detection and/or diagnosis of SARS-CoV-2 by FDA under an Emergency Use Authorization (EUA).  This EUA will remain in effect (meaning this test can be used) for the duration of the COVID-19 declaration under Section 564(b)(1) of the Act, 21 U.S.C. section 360bbb-3(b)(1), unless the authorization is terminated or revoked sooner.  Performed at [facility], [place] [address] [place], [place] [address]   Culture, blood (routine x 2)     Status: None (Preliminary result)   Collection Time: 09/04/19 10:10 PM   Specimen: BLOOD  Result Value Ref Range Status   Specimen Description BLOOD RIGHT ANTECUBITAL  Final   Special Requests   Final    BOTTLES DRAWN AEROBIC AND ANAEROBIC Blood Culture adequate volume   Culture   Final    NO GROWTH 3 DAYS Performed at [facility], [place]  [address], [place], [place] [address]    Report Status PENDING  Incomplete      Radiology Studies: No results found.    Scheduled Meds: . dexamethasone (DECADRON) injection  8 mg Intravenous Q6H  . docusate sodium  100 mg Oral BID  . famotidine  20 mg Oral Daily  . feeding supplement (PRO-STAT SUGAR FREE 64)  30 mL Oral BID  . folic acid  1 mg Oral Daily  . gabapentin  100 mg Oral QHS  . hydrALAZINE  25 mg Oral Q8H  . magic mouthwash  5 mL Oral QID  . nystatin  5 mL Oral QID  . pantoprazole  40 mg Oral BID AC  . sodium chloride flush  3 mL Intravenous Q12H  . sucralfate  1 g Oral TID WC & HS   Continuous Infusions: . lactated ringers 100 mL/hr at 09/09/19 0700  . 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 On Decadron. EEG as above noted. Was getting chemotherapy, but missed the last 2 sessions due to confusion and lack of social support in keeping up with appointments..  Case discussed in tumor board and radiation oncology consulted for total brain radiation treatments versus stereotactic procedures.  May need inpatient treatments until acceptable outpatient arrangement for follow-ups made. Addendum 5 PM: Seen by radiology and plan for initiating inpatient radiation treatments and transferred to [facility].  Will discuss with bed placement and arrange transfer.  Delirium/Confusion: Secondary to brain metastases , cerebral edema and possibly subclinical seizures.  He is on Keppra for seizure prophylaxis.  He was calm and appropriate in conversation during rounds today but per nursing staff at times drifts off.  Although mental status appears to be overall improving, he may not be safe for driving for outpatient treatments.  Also has poor social support..  Oral candidiasis: In the setting of  high-dose steroids.  Not responding to nystatin swish and swallow.  Will order oral Diflucan and monitor response.  Chest pain due to GERD/esophagitis: No evidence of cardiac ischemia at this time. Troponin stable. Continue PPI.  AKIin the setting of chronic kidney disease, stage I-3a: Likely secondary to seizure. Hydrating. Creatinine started to trend downward. Creatinine 1.18 this morning. Monitor creatinine, electrolytes, and  volume status. Avoid nephrotoxic substances and hypotension.  DVT :Diagnosed in March 2021 with extensive left lower extremity DVT. Treated with thrombectomy and was using therapeutic Lovenox at home. In discussion with neurosurgery given his CNS lesion and midline shift, no anticoagulation currently. On SCDs.  Hypokalemia: Resolved. Monitor and supplement as necessary.   Hypertension: Blood pressure is elevated. The patient will be started on hydralazine. Monitor.  Pacemaker: Noted. Incompatible with MRI.  COPD: Stable. Monitor.  Leucocytosis: Likely stress margination and steroids.   Depression: Noted. Continue home medications.  Obesity (BMI 30-39.9): Noted. Patient meets criteria with BMI greater than 30.  He however now has advanced malignancy      DVT prophylaxis: TED hose, given recent diagnosis of DVT and inability to give anticoagulation. Code Status:  Full code Family / Patient Communication:  Patient lives alone in "tiny housing community".  States has no next of kin and most of his family members have passed away.  He states his next-door neighbor who is wheelchair-bound usually checks on him but unable to drive him to appointments. Disposition Plan:   Status is: Inpatient  Remains inpatient appropriate because:IV treatments appropriate due to intensity of illness or inability to take PO.  Pending radiation treatments.   Dispo: The patient is from: Home  Anticipated d/c is to: TBD  Anticipated d/c date  is: > 3 days  Patient currently is not medically cleared for discharge with pending radiation treatments.   Time spent: 35 minutes   >50% time spent in discussions with care team and coordination of care.    Guilford Shi, MD [facility] Pager in [place]  If 7PM-7AM, please contact night-coverage [email or web address] 09/09/2019, 9:03 AM

## 2019-09-10 ENCOUNTER — Ambulatory Visit
Admit: 2019-09-10 | Discharge: 2019-09-10 | Disposition: A | Discharge status: Home / Self Care | Payer: No Typology Code available for payment source | Attending: Radiation Oncology | Admitting: Radiation Oncology

## 2019-09-10 DIAGNOSIS — C3490 Malignant neoplasm of unspecified part of unspecified bronchus or lung: Secondary | ICD-10-CM

## 2019-09-10 DIAGNOSIS — R9089 Other abnormal findings on diagnostic imaging of central nervous system: Secondary | ICD-10-CM

## 2019-09-10 DIAGNOSIS — C7931 Secondary malignant neoplasm of brain: Principal | ICD-10-CM

## 2019-09-10 LAB — CULTURE, BLOOD (ROUTINE X 2)
Culture: NO GROWTH
Culture: NO GROWTH
Special Requests: ADEQUATE
Special Requests: ADEQUATE

## 2019-09-10 MED ORDER — LEVETIRACETAM 500 MG PO TABS
500.0000 mg | ORAL_TABLET | Freq: Two times a day (BID) | ORAL | Status: DC
  Administered 2019-09-10 – 2019-09-16 (×12): 500 mg via ORAL
  Filled 2019-09-10 (×12): qty 1

## 2019-09-10 NOTE — Progress Notes (Signed)
PROGRESS NOTE    Daniel Bryant  XKG:[id number] DOB: 1960/07/07 DOA: 09/04/2019 [facility]: [facility]   Brief Narrative: Daniel Bryant is a 59 y.o. male with a history of stage IV non-small cell lung cancer on chemotherapy, hypertension, depression, neuropathy, DVT, COPD, SSS s/p pacer. Patient presented secondary to chest pain, nausea and vomiting and admitted for AKI. CT head was obtained and was concerning for metastatic disease with associated midline shift. Neurosurgery consulted with eventual recommendations for whole brain radiation therapy.   Assessment & Plan:   Principal Problem:   Non-small cell lung cancer metastatic to brain [facility]) Active Problems:   Hypokalemia   Hypertension   Pacemaker   COPD (chronic obstructive pulmonary disease) (HCC)   AKI (acute kidney injury) (HCC)   DVT (deep venous thrombosis) (HCC)   Leucocytosis   Macrocytosis   Elevated SGOT (AST)   Chest pain due to GERD   Esophagitis   2.37 mm right to left midline shift   Hallucination, visual   Acute encephalopathy   Depression   Obesity (BMI 30-39.9)   Stage IV adenocarcinoma of RUL Brain metastasis Vasogenic brain edema Neurosurgery consulted and recommended whole brain radiation. Patient transferred to [facility] on 6/28 to initiate XRT. Discussed with neuro oncology; patient will be managed by radiation/medical oncology and seen by neuro oncology as needed and likely while receiving radiation -Continue Decadron IV, Keppra (seizure prophylaxis) -Radiation oncology recommendations: XRT  Delirium Confusion Secondary to above. Appears to be improved.  Oral candidiasis Patient is on both Nystatin and Magic Mouthwash. -Continue Nystatin  Chest pain Appears to have been secondary to GERD/esophagitis. Resolved.  GERD Esophagitis -Continue Protonix/Pepcid  AKI on CKD stage IIIa Baseline creatinine of 1.1. Peak creatinine of 2.12 which has now resolved back to baseline  Chronic  DVT S/p thrombectomy and started on Lovenox as an outpatient three months ago. Not on anticoagulation secondary to brain lesion with resultant midline shift per recommendations by neurosurgery.   Hypokalemia Intermittent -Supplementation as needed  Essential hypertension -Continue hydralazine 25 mg TID  Sick sinus syndrome S/p pacemaker. MRI incompatible.  COPD  Stable.  Leukocytosis No infectious evidence. Likely related to steroids. Trended down.  Obesity Body mass index is 31.57 kg/m.   DVT prophylaxis: SCDs Code Status:   Code Status: Full Code Family Communication: None at bedside Disposition Plan: Discharge pending radiation oncology recommendations with regard to XRT   Consultants:   Neurosurgery  Medical oncology  Radiation oncology  Palliative care medicine  Procedures:   EEG (09/05/2019) IMPRESSION: This study showed evidence of potential epileptogenicity as well as cortical dysfunction in left posterior quadrant secondary to underlying structural abnormality. Additionally, there is evidence of mild to moderate diffuse encephalopathy, non specific to etiology. No seizures were seen throughout the recording.  Antimicrobials:  None    Subjective: No concerns today.  Objective: Vitals:   09/09/19 2150 09/09/19 2156 09/10/19 0152 09/10/19 0626  BP: (!) 152/110  (!) 148/96 (!) 150/95  Pulse: 78  82 65  Resp: 17  16 17   Temp: 98.3 F (36.8 C)  98.6 F (37 C) 97.8 F (36.6 C)  TempSrc: Oral  Oral Oral  SpO2: 96%  98% 98%  Weight:  105.6 kg  105.6 kg  Height:        Intake/Output Summary (Last 24 hours) at 09/10/2019 0727 Last data filed at 09/09/2019 2045 Gross per 24 hour  Intake 1982 ml  Output --  Net 1982 ml   [employer]  09/09/19 0500 09/09/19 2156 09/10/19 0626  Weight: 102 kg 105.6 kg 105.6 kg    Examination:  General exam: Appears calm and comfortable Respiratory system: Clear to auscultation. Respiratory effort  normal. Cardiovascular system: S1 & S2 heard, RRR. No murmurs, rubs, gallops or clicks. Gastrointestinal system: Abdomen is nondistended, soft and nontender. No organomegaly or masses felt. Normal bowel sounds heard. Central nervous system: Alert and oriented. No focal neurological deficits. Musculoskeletal: No edema. No calf tenderness Skin: No cyanosis. Some ecchymosis of left flank that appears old Psychiatry: Judgement and insight appear normal. Mood & affect appropriate.     Data Reviewed: I have personally reviewed following labs and imaging studies  CBC Lab Results  Component Value Date   WBC 10.9 (H) 09/09/2019   RBC 3.77 (L) 09/09/2019   HGB 13.2 09/09/2019   HCT 38.5 (L) 09/09/2019   MCV 102.1 (H) 09/09/2019   MCH 35.0 (H) 09/09/2019   PLT 122 (L) 09/09/2019   MCHC 34.3 09/09/2019   RDW 12.7 09/09/2019   LYMPHSABS 0.5 (L) 09/09/2019   MONOABS 0.8 09/09/2019   EOSABS 0.0 09/09/2019   BASOSABS 0.0 18/29/9371     Last metabolic panel Lab Results  Component Value Date   NA 138 09/09/2019   K 3.2 (L) 09/09/2019   CL 102 09/09/2019   CO2 28 09/09/2019   BUN 22 09/09/2019   CREATININE 1.10 09/09/2019   GLUCOSE 151 (H) 09/09/2019   GFRNONAA >60 09/09/2019   GFRAA >60 09/09/2019   CALCIUM 8.0 (L) 09/09/2019   PHOS 1.2 (L) 08/08/2018   PROT 7.8 09/06/2019   ALBUMIN 3.5 09/06/2019   BILITOT 0.8 09/06/2019   ALKPHOS 78 09/06/2019   AST 43 (H) 09/06/2019   ALT 28 09/06/2019   ANIONGAP 8 09/09/2019    CBG (last 3)  No results for input(s): GLUCAP in the last 72 hours.   GFR: Estimated Creatinine Clearance: 88.6 mL/min (by C-G formula based on SCr of 1.1 mg/dL).  Coagulation Profile: Recent Labs  Lab 09/04/19 2005  INR 1.2    Recent Results (from the past 240 hour(s))  Culture, blood (routine x 2)     Status: None   Collection Time: 09/04/19  9:40 PM   Specimen: BLOOD  Result Value Ref Range Status   Specimen Description BLOOD RIGHT ANTECUBITAL   Final   Special Requests   Final    BOTTLES DRAWN AEROBIC AND ANAEROBIC Blood Culture adequate volume   Culture   Final    NO GROWTH 5 DAYS Performed at [facility], [address] [place], [place] [address]    Report Status 09/10/2019 FINAL  Final  SARS Coronavirus 2 by RT PCR (hospital order, performed in [facility] hospital lab) Nasopharyngeal Nasopharyngeal Swab     Status: None   Collection Time: 09/04/19 10:04 PM   Specimen: Nasopharyngeal Swab  Result Value Ref Range Status   SARS Coronavirus 2 NEGATIVE NEGATIVE Final    Comment: (NOTE) SARS-CoV-2 target nucleic acids are NOT DETECTED.  The SARS-CoV-2 RNA is generally detectable in upper and lower respiratory specimens during the acute phase of infection. The lowest concentration of SARS-CoV-2 viral copies this assay can detect is 250 copies / mL. A negative result does not preclude SARS-CoV-2 infection and should not be used as the sole basis for treatment or other patient management decisions.  A negative result may occur with improper specimen collection / handling, submission of specimen other than nasopharyngeal swab, presence of viral mutation(s) within the  areas targeted by this assay, and inadequate number of viral copies (<250 copies / mL). A negative result must be combined with clinical observations, patient history, and epidemiological information.  Fact Sheet for Patients:   StrictlyIdeas.no  Fact Sheet for Healthcare Providers: BankingDealers.co.za  This test is not yet approved or  cleared by the [place] FDA and has been authorized for detection and/or diagnosis of SARS-CoV-2 by FDA under an Emergency Use Authorization (EUA).  This EUA will remain in effect (meaning this test can be used) for the duration of the COVID-19 declaration under Section 564(b)(1) of the Act, 21 U.S.C. section 360bbb-3(b)(1), unless the authorization is terminated  or revoked sooner.  Performed at [facility], [place] [address] [place], [place] [address]   Culture, blood (routine x 2)     Status: None   Collection Time: 09/04/19 10:10 PM   Specimen: BLOOD  Result Value Ref Range Status   Specimen Description BLOOD RIGHT ANTECUBITAL  Final   Special Requests   Final    BOTTLES DRAWN AEROBIC AND ANAEROBIC Blood Culture adequate volume   Culture   Final    NO GROWTH 5 DAYS Performed at [facility], [place] [address], [place],  [address]    Report Status 09/10/2019 FINAL  Final        Radiology Studies: No results found.      Scheduled Meds: . dexamethasone (DECADRON) injection  8 mg Intravenous Q6H  . docusate sodium  100 mg Oral BID  . famotidine  20 mg Oral Daily  . feeding supplement (PRO-STAT SUGAR FREE 64)  30 mL Oral BID  . fluconazole  100 mg Oral Daily  . folic acid  1 mg Oral Daily  . gabapentin  100 mg Oral QHS  . hydrALAZINE  25 mg Oral Q8H  . magic mouthwash  5 mL Oral QID  . nystatin  5 mL Oral QID  . pantoprazole  40 mg Oral BID AC  . sodium chloride flush  3 mL Intravenous Q12H  . sucralfate  1 g Oral TID WC & HS   Continuous Infusions: . lactated ringers 100 mL/hr at 09/10/19 0627  . levETIRAcetam 500 mg (09/09/19 2045)     LOS: 6 days     Cordelia Poche, MD [facility] 09/10/2019, 7:27 AM  If 7PM-7AM, please contact night-coverage [email or web address]

## 2019-09-10 NOTE — Progress Notes (Signed)
PHARMACIST - PHYSICIAN COMMUNICATION  CONCERNING: IV to Oral Route Change Policy  RECOMMENDATION: This patient is receiving keppra by the intravenous route.  Based on criteria approved by the Pharmacy and Therapeutics Committee, the intravenous medication(s) is/are being converted to the equivalent oral dose form(s).   DESCRIPTION: These criteria include:  The patient is eating (either orally or via tube) and/or has been taking other orally administered medications for a least 24 hours  The patient has no evidence of active gastrointestinal bleeding or impaired GI absorption (gastrectomy, short bowel, patient on TNA or NPO).  If you have questions about this conversion, please contact the Pharmacy Department  []   [phone] )  [facility] []   [phone] )  [facility] []   [phone] )  [facility] []   [phone] )  [facility] [x]   [phone] )  [place], [facility] 09/10/2019 10:28 AM

## 2019-09-10 NOTE — Progress Notes (Signed)
Occupational Therapy Treatment Patient Details Name: Daniel Bryant MRN: [id number] DOB: 1961/02/04 Today's Date: 09/10/2019    History of present illness 59 yo male with onset of AMS and hallucinations of bugs in the house, missed recent appointments and general confusion was sent to ED.  Pt is complaining of tongue pain and states it was hurting and injured pre-hosp trip. Having HA's, confusion interfering with history.  Has esophageal mets, R parietal mets.  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 We will continue to follow.  Follow Up Recommendations  Supervision/Assistance - 24 hour (ALF v. HHOT)    Equipment Recommendations  None recommended by OT       Precautions / Restrictions Precautions Precautions: None       Mobility Bed Mobility Overal bed mobility: Independent                Transfers Overall transfer level: Independent                    Balance Overall balance assessment: No apparent balance deficits (not formally assessed)                                         ADL either performed or assessed with clinical judgement   ADL Overall ADL's : Independent                                             Vision Baseline Vision/History: Wears glasses Wears Glasses: Distance only Patient Visual Report: Blurring of vision (without glasses, but can still see. Only needs them for distance) Additional Comments: Pt able to read paper handed to him. He was able to scan menu handout and read back  to me what I had asked him to find.          Cognition Arousal/Alertness: Awake/alert Behavior During Therapy: WFL for tasks assessed/performed Overall Cognitive Status: Impaired/Different from baseline Area of Impairment: Memory                     Memory:  (could only tell me the name of one medication he takes at home and intially told me he takes it at night, then changed his report and said he takes it in the morning.)                             Pertinent Vitals/ Pain       Pain Assessment: No/denies pain         Frequency  Min 2X/week        Progress Toward Goals  OT Goals(current goals can now be found in the care plan section)  Progress towards OT goals: Progressing toward goals     Plan Discharge plan remains appropriate  AM-PAC OT "6 Clicks" Daily Activity     Outcome Measure   Help from another person eating meals?: None Help from another person taking care of personal grooming?: None Help from another person toileting, which includes using toliet, bedpan, or urinal?: None Help from another person bathing (including washing, rinsing, drying)?: None Help from another person to put on and taking off regular upper body clothing?: None Help from another person to put on and taking off regular lower body clothing?: None 6 Click Score: 24    End of Session Equipment Utilized During Treatment:  (pt managing his own IV to get to bathroom)  OT Visit Diagnosis: Other symptoms and signs involving cognitive function   Activity Tolerance Patient tolerated treatment well   Patient Left in bed (calling for his lunch)   Nurse Communication          Time: 9357-0177 OT Time Calculation (min): 17 min  Charges: OT General Charges $OT Visit: 1 Visit OT Treatments $Self Care/Home Management : 8-22 mins  Daniel Bryant, OTR/L [facility] [employer] Pager [phone] Office [phone]      Almon Register 09/10/2019,  12:29 PM

## 2019-09-10 NOTE — Progress Notes (Signed)
Patient arrived to unit via EMS at around 2145. Pt was able to walk into bed. Pt AOx4. verfied ID. Gait steady. Denied vision disturbances. Vitals taken. Orientated to unit. Belongings reviewed. Explained plan of care. Pt stated having pain to tongue and posterior head. Full assessment done along with skin check with second RN Discussed options. Contacted MD for orders. Meds administered. Instructed patient bed alarm will be placed due to fall risk with lines and pain meds. Pt agreeable. Safety measures in place. Call bell within reach. Seizure precautions implemented. Will continue to monitor and round.

## 2019-09-11 ENCOUNTER — Ambulatory Visit
Admit: 2019-09-11 | Discharge: 2019-09-11 | Disposition: A | Discharge status: Home / Self Care | Payer: No Typology Code available for payment source | Attending: Radiation Oncology | Admitting: Radiation Oncology

## 2019-09-11 DIAGNOSIS — D72829 Elevated white blood cell count, unspecified: Secondary | ICD-10-CM

## 2019-09-11 DIAGNOSIS — K209 Esophagitis, unspecified without bleeding: Secondary | ICD-10-CM

## 2019-09-11 DIAGNOSIS — R441 Visual hallucinations: Secondary | ICD-10-CM

## 2019-09-11 LAB — BASIC METABOLIC PANEL
Anion gap: 12 (ref 5–15)
BUN: 23 mg/dL (ref 8–23)
CO2: 25 mmol/L (ref 22–32)
Calcium: 8.1 mg/dL — ABNORMAL LOW (ref 8.9–10.3)
Chloride: 100 mmol/L (ref 98–111)
Creatinine, Ser: 1 mg/dL (ref 0.61–1.24)
GFR calc Af Amer: >60 mL/min (ref >60)
GFR calc non Af Amer: >60 mL/min (ref >60)
Glucose, Bld: 158 mg/dL — ABNORMAL HIGH (ref 70–99)
Potassium: 3.6 mmol/L (ref 3.5–5.1)
Sodium: 137 mmol/L (ref 135–145)

## 2019-09-11 LAB — CBC WITH DIFFERENTIAL/PLATELET
Abs Immature Granulocytes: 0.25 10*3/uL — ABNORMAL HIGH (ref 0.00–0.07)
Basophils Absolute: 0 10*3/uL (ref 0.0–0.1)
Basophils Relative: 0 %
Eosinophils Absolute: 0 10*3/uL (ref 0.0–0.5)
Eosinophils Relative: 0 %
HCT: 42.1 % (ref 39.0–52.0)
Hemoglobin: 14.9 g/dL (ref 13.0–17.0)
Immature Granulocytes: 2 %
Lymphocytes Relative: 4 %
Lymphs Abs: 0.6 10*3/uL — ABNORMAL LOW (ref 0.7–4.0)
MCH: 35.6 pg — ABNORMAL HIGH (ref 26.0–34.0)
MCHC: 35.4 g/dL (ref 30.0–36.0)
MCV: 100.7 fL — ABNORMAL HIGH (ref 80.0–100.0)
Monocytes Absolute: 0.9 10*3/uL (ref 0.1–1.0)
Monocytes Relative: 5 %
Neutro Abs: 15 10*3/uL — ABNORMAL HIGH (ref 1.7–7.7)
Neutrophils Relative %: 89 %
Platelets: 163 10*3/uL (ref 150–400)
RBC: 4.18 MIL/uL — ABNORMAL LOW (ref 4.22–5.81)
RDW: 12.9 % (ref 11.5–15.5)
WBC: 16.8 10*3/uL — ABNORMAL HIGH (ref 4.0–10.5)
nRBC: 0 % (ref 0.0–0.2)

## 2019-09-11 LAB — COMPREHENSIVE METABOLIC PANEL
ALT: 47 U/L — ABNORMAL HIGH (ref 0–44)
AST: 30 U/L (ref 15–41)
Albumin: 3.2 g/dL — ABNORMAL LOW (ref 3.5–5.0)
Alkaline Phosphatase: 67 U/L (ref 38–126)
Anion gap: 13 (ref 5–15)
BUN: 24 mg/dL — ABNORMAL HIGH (ref 8–23)
CO2: 26 mmol/L (ref 22–32)
Calcium: 8.3 mg/dL — ABNORMAL LOW (ref 8.9–10.3)
Chloride: 100 mmol/L (ref 98–111)
Creatinine, Ser: 0.97 mg/dL (ref 0.61–1.24)
GFR calc Af Amer: >60 mL/min (ref >60)
GFR calc non Af Amer: >60 mL/min (ref >60)
Glucose, Bld: 157 mg/dL — ABNORMAL HIGH (ref 70–99)
Potassium: 3.7 mmol/L (ref 3.5–5.1)
Sodium: 139 mmol/L (ref 135–145)
Total Bilirubin: 0.6 mg/dL (ref 0.3–1.2)
Total Protein: 6.5 g/dL (ref 6.5–8.1)

## 2019-09-11 LAB — MAGNESIUM: Magnesium: 1.7 mg/dL (ref 1.7–2.4)

## 2019-09-11 LAB — PHOSPHORUS: Phosphorus: 1.9 mg/dL — ABNORMAL LOW (ref 2.5–4.6)

## 2019-09-11 MED ORDER — K PHOS MONO-SOD PHOS DI & MONO 155-852-130 MG PO TABS
500.0000 mg | ORAL_TABLET | Freq: Two times a day (BID) | ORAL | Status: AC
  Administered 2019-09-11 (×2): 500 mg via ORAL
  Filled 2019-09-11 (×2): qty 2

## 2019-09-11 NOTE — Progress Notes (Signed)
Pt returned from radiation therapy, VSS.

## 2019-09-11 NOTE — TOC Progression Note (Signed)
Transition of Care [facility]) - Progression Note    Patient Details  Name: Daniel Bryant MRN: [id number] Date of Birth: 07/27/1960  Transition of Care [facility]) CM/SW Contact  Alexx Giambra, Marjie Skiff, RN Phone Number: 09/11/2019, 2:36 PM  Clinical Narrative:    This CM met with pt at bedside for dc planning. Pt lives alone at a tiny house community. Pt states that he will be going back home at dc. He states that he is fully ambulatory on his own. This CM asked pt about transportation and pt states that he has a car. This CM asked pt if his doctor has told him that he is allowed to drive in his current condition. Pt states that his doctor hasn't told him he couldn't drive and "that I should be fine to drive" This CM encouraged pt to talk this over with his MD and that I would be happy to help with transportation needs. When pt asked about taking his medications at home he states that he has a nurse that comes out once a month but that he takes him medications fine. This CM left VM at the [place] to inquire about home health company to notify them of need for resumption of care at dc. TOC will continue to follow.   Expected Discharge Plan: Home/Self Care Barriers to Discharge: Continued Medical Work up, Other (comment) (memory impairment)  Expected Discharge Plan and Services Expected Discharge Plan: Home/Self Care In-house Referral: NA Discharge Planning Services: CM Consult Post Acute Care Choice: NA Living arrangements for the past 2 months: Single Family Home Risk manager)                 DME Arranged: N/A DME Agency: NA       HH Arranged: NA HH Agency: NA         Social Determinants of Health (SDOH) Interventions    Readmission Risk Interventions Readmission Risk Prevention Plan 09/06/2019 05/21/2019  Transportation Screening Complete Complete  PCP or Specialist Appt within 5-7 Days - Complete  PCP or Specialist Appt within 3-5 Days Complete -  Home Care Screening -  Complete  Medication Review (RN CM) - Complete  HRI or Home Care Consult Complete -  Social Work Consult for [place] Planning/Counseling Complete -  Palliative Care Screening Not Applicable -  Medication Review [employer]) Referral to Pharmacy -

## 2019-09-11 NOTE — Progress Notes (Signed)
[facility] Therapy Treatment Record Phone [phone]   Radiation Therapy was administered to Daniel Bryant on: 09/11/2019  11:33 AM and was treatment # 1 out of a planned course of 10 treatments.  Radiation Treatment  1). Beam photons with 6-10 energy  2). Brachytherapy None  3). Stereotactic Radiosurgery None  4). Other Radiation None     Donnel Venuto A Loza Prell, RT (T)

## 2019-09-11 NOTE — Progress Notes (Signed)
Occupational Therapy Treatment Patient Details Name: Daniel Bryant MRN: [id number] DOB: October 22, 1960 Today's Date: 09/11/2019    History of present illness 59 yo male with onset of AMS and hallucinations of bugs in the house, missed recent appointments and general confusion was sent to ED.  Pt is complaining of tongue pain and states it was hurting and injured pre-hosp trip. Having HA's, confusion interfering with history.  Has esophageal mets, R parietal mets.  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 3 or more errors of any type is a failing score. Patient made 17 errors. Patient able to read labels on bottles and place pseudo bills in med box. The first error occurred when patient placed pills only twice a day instead of three times a day as the pill bottle stated. On the medication that said medication to be given at breakfast and dinner - patient placed pills in bedtime slot. When error pointed out later - patient countered with "that's when I eat." Patient unable to place the third medication every other day - placing two pills side by side. Unable to problem solve and correct his mistake he removed the pills and placed in the bottle. In discussing the results of the test - patient reports he does not use a med box at home and takes the pills out of the bottle and was not concerned with the errors he made. Currently patient unable to manage his own medication due to cognitive deficits and poor safety awareness. Therapist continues to recommend 24/7 supervision at discharge. If unable to procure home assistance patient may need to be placed at a facility.   Follow Up Recommendations  Supervision/Assistance - 24 hour    Equipment Recommendations  None recommended by OT     Recommendations for Other Services      Precautions / Restrictions Precautions Precautions: None       Mobility Bed Mobility                  Transfers                      Balance                                           ADL either performed or assessed with clinical judgement   ADL                                         General ADL Comments: Patient in bathroom independently when therapist entered the room. Patient performed toileting and grooming without assistance.     Vision       Perception     Praxis      Cognition Arousal/Alertness: Awake/alert Behavior During Therapy: WFL for tasks assessed/performed Overall Cognitive Status: Impaired/Different from baseline Area of Impairment: Safety/judgement;Memory;Awareness                 Orientation Level: Situation Current Attention Level: Sustained Memory: Decreased short-term memory   Safety/Judgement: Decreased awareness of safety;Decreased awareness of deficits Awareness: Emergent  Exercises     Shoulder Instructions       General Comments      Pertinent Vitals/ Pain       Pain Assessment: No/denies pain  Home Living                                          Prior Functioning/Environment              Frequency  Min 2X/week        Progress Toward Goals  OT Goals(current goals can now be found in the care plan section)  Progress towards OT goals: Progressing toward goals     Plan      Co-evaluation                 AM-PAC OT "6 Clicks" Daily Activity     Outcome Measure   Help from another person eating meals?: None Help from another person taking care of personal grooming?: None Help from another person toileting, which includes using toliet, bedpan, or urinal?: None Help from another person bathing (including washing, rinsing, drying)?: None   Help from another person to put  on and taking off regular lower body clothing?: None 6 Click Score: 20    End of Session    OT Visit Diagnosis: Other symptoms and signs involving cognitive function   Activity Tolerance Patient tolerated treatment well   Patient Left in bed   Nurse Communication  (okay to see patient.)        Time: 1002-1015 OT Time Calculation (min): 13 min  Charges: OT General Charges $OT Visit: 1 Visit OT Treatments $Self Care/Home Management : 8-22 mins  Daniel Bryant, OTR/L [place]  Office [phone] Pager: [phone]    Daniel Bryant 09/11/2019, 1:34 PM

## 2019-09-11 NOTE — Plan of Care (Signed)
  Problem: Education: Goal: Knowledge of General Education information will improve Description: Including pain rating scale, medication(s)/side effects and non-pharmacologic comfort measures Outcome: Progressing   Problem: Health Behavior/Discharge Planning: Goal: Ability to manage health-related needs will improve Outcome: Progressing   Problem: Clinical Measurements: Goal: Ability to maintain clinical measurements within normal limits will improve Outcome: Progressing   Problem: Clinical Measurements: Goal: Will remain free from infection Outcome: Progressing   Problem: Clinical Measurements: Goal: Respiratory complications will improve Outcome: Progressing   Problem: Clinical Measurements: Goal: Cardiovascular complication will be avoided Outcome: Progressing

## 2019-09-11 NOTE — Progress Notes (Signed)
PROGRESS NOTE    Daniel Bryant  WPY:[id number] DOB: [birth year] DOA: 09/04/2019 PCP: [facility]   Brief Narrative:  HPI per Dr. Berle Mull on 09/04/19 HPI: Daniel Bryant is a 59 y.o. male with Past medical history of stage IV non-small cell lung cancer/adenocarcinoma follows up with Dr. Julien Nordmann currently on carboplatin Alimta, last dose June 06, 2019, no-show for April and May appointments, drug-induced rash secondary to Alimta, HTN, GERD, neuropathy, depression, hyperlipidemia. Patient presents with complaints of chest pain.  Reportedly chest pain began on the night of 09/03/2019 associated with headache shortness of breath nausea and upset stomach.  He also had vomiting episode at home.  EMS was called due to chest pain.  Patient lives alone but reportedly has home health who comes home. On further evaluation patient tells me that he may have a fall, found himself on the floor 1 day, he also has tongue bite but he does not know when it occurred. He denies having any focal deficit right now.  He reports dizziness but no vertigo.  On exam he has blurry vision but denies any history of the same at home. No loss of control of bowel or bladder. Reportedly he is compliant with his Lovenox injections. No diarrhea no constipation reported.  No bleeding reported by the patient. Currently does not have any chest pain. Reports forgetfulness. Does not have any family member to help him assist with decision-making process.  Does not have any HCPOA.  Denies any alcohol abuse or drug abuse.  No smoking. Patient has been seeing bugs crawling all over the room.  He also saw bubbles moving on his arm during examination.  ED Course: Presents with complaints of chest pain.  Initial troponin EKG unremarkable.  Found to have AKI.  Chest x-ray unremarkable as well.  CT without contrast was performed which was negative for any significant acute abnormality. Patient had significant confusion as  well as hallucination.  Patient was seeing bugs crawling all over.  Therefore CT head was performed which showed metastatic lesions with midline shift.  Neurosurgery was consulted they will evaluate the patient in the morning.  Patient was referred for admission.  At his baseline ambulates without assistance independent for most of his ADL;  manages his medication on his own.  **Interim History Patient was transferred to [facility] on 09/09/2019 for whole brain radiation at the recommendation of neurosurgery.Marland Kitchen  His AKI is improved and head CT that was obtained showed metastatic disease along with midline shift.  Currently he is about to go for a second brain radiation treatment today and is continued on dexamethasone.  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  -Patient transferred to [facility] on 6/28 to initiate XRT.  -My colleague Discussed with neuro oncology; patient will be managed by radiation/medical oncology and seen by neuro oncology as needed and  likely while receiving radiation -Continue Dexamethasone IV 8 mg q6h, Keppra 500 mg po BID (seizure prophylaxis) -Radiation oncology recommendations: XRT and to undergo whole Brain Radiation today  -C/w Supportive Care and now Discontinued IVF -C/w Antiemetics with po/IV Ondansetron 4 mg q6hprn Nausea    Delirium Confusion -Secondary to above. Appears to be improved.  Oral Candidiasis -Patient is on both Nystatin and Magic Mouthwash along with Fluconazole -Continue Nystatin and Fluconazole and will discontinue Magic Mouthwas  Chest pain -Appears to have been secondary to GERD/esophagitis. Resolved. -Continue with PPI as below and also continue with Maalox/Mylanta 30 mL p.o. every 4 hours as needed indigestion  GERD Esophagitis -Continue Pantoprazole 40 mg po BID and Famotidine 20 mg po Daily; Also has Maalox and Sucralafate 1 gram po TIDwm and Bedtime   AKI on CKD stage IIIa, improved -Baseline creatinine of 1.1. Peak creatinine of 2.12 which has now resolved back to baseline -Patient's BUN/Cr is now 24/0.97 -Will stop IVF Hydration with LR at 100 mL/hr -Avoid nephrotoxic medications, contrast dyes, hypotension and renally adjust medications -Continue to monitor and trend renal function repeat CMP in a.m.  Chronic DVT -S/p thrombectomy and started on Lovenox as an outpatient three months ago.  -Not on anticoagulation secondary to brain lesion with resultant midline shift per recommendations by neurosurgery.   Hypokalemia -Intermittent and now K+ is 3.6 -Continue to Monitor and Replete as necessary -Repeat CMP in the AM   Essential Hypertension -Continue Hydralazine 25 mg q8h -Will stop LR at 100 mL/hr (has been there since 09/04/19)  Sick Sinus Syndrome -S/p pacemaker.  -MRI incompatible.  Elevated ALT -Mild with an ALT of 47; Likely in the setting of Steroids  -Continue to Monitor carefully and repeat CMP in the AM   COPD  -Stable and currently not in exacerbation -If necessary will add PRN Nebs   Hypophosphatemia -Patient's Phos Level this AM was 1.9 -Replete with po K Phos Neutral 500 mg po BID x2 -Continue to Monitor and Replete as Necessary -Repeat Phos Level in the AM  Leukocytosis -No infectious evidence.  -Likely related to steroid  demargination.  -Trended down as WBC went from 17.2 -> 10.9 -Not repeated today but will need continual monitoring -Continue to Monitor for S/Sx of Infection -Repeat CBC in the AM.  Hyperglycemia -Blood Sugars have been elevated on Daily BMP/CMP -CBG's ranging from 136-158 -Continue to Monitor and Trend Blood Sugars carefully and if necessary will need to be placed on Sensitive Novolog SSI AC  Obesity -Estimated body mass index is 32.17 kg/m as calculated from the following:   Height as of this encounter: 6' (1.829 m).   Weight as of this encounter: 107.6 kg. -Continued Weight Loss and Dietary Counseling given    DVT prophylaxis: SCDs Code Status: FULL CODE  Family Communication: No family present at bedside  Disposition Plan: Pending further clearance from Specialists (Neuro-Onc, Radiation Onc, and Medical Onc)  Status is: Inpatient  Remains inpatient appropriate because:Ongoing diagnostic testing needed not appropriate for outpatient work up, IV treatments appropriate due to intensity of illness or inability to take PO and Inpatient level of care appropriate due to severity of illness   Dispo: The patient is from: Home              Anticipated d/c is to: TBD              Anticipated d/c date is: 2 days  Patient currently is not medically stable to d/c.  Consultants:   Neurosurgery  Neuro Oncology   Medical Oncology  Radiation Oncology  Palliative Care Medicine   Procedures:  EEG (09/05/2019) IMPRESSION: This studyshowed evidence of potential epileptogenicity as well as cortical dysfunction in left posterior quadrant secondary to underlying structural abnormality. Additionally, there is evidence of mild to moderate diffuse encephalopathy, non specific to etiology.No seizures were seen throughout the recording.  Antimicrobials:  Anti-infectives (From admission, onward)   Start     Dose/Rate Route Frequency Ordered Stop   09/09/19 1230  fluconazole  (DIFLUCAN) tablet 100 mg     Discontinue     100 mg Oral Daily 09/09/19 1220       Subjective: Seen and examined at bedside and he is working with occupational therapist.  He is not complaining of any complaints.  No nausea or vomiting.  Has some lower extremity swelling.  Feels okay.  Complains of some neck and back stiffness which is not uncommon for him.  No other concerns or plans at this time.  Objective: Vitals:   09/10/19 1438 09/10/19 2138 09/11/19 0549 09/11/19 0609  BP: (!) 178/97 (!) 165/101 (!) 164/104   Pulse: 74 71 63   Resp:  15 17   Temp:  98.5 F (36.9 C) 98.1 F (36.7 C)   TempSrc:  Oral Oral   SpO2:  94% 97%   Weight:    107.6 kg  Height:        Intake/Output Summary (Last 24 hours) at 09/11/2019 1110 Last data filed at 09/11/2019 0930 Gross per 24 hour  Intake 840 ml  Output --  Net 840 ml   Filed Weights   09/09/19 2156 09/10/19 0626 09/11/19 0609  Weight: 105.6 kg 105.6 kg 107.6 kg   Examination: Physical Exam:  Constitutional: WN/WD obese Caucasian male currently in NAD and appears calm and comfortable Eyes: Lids and conjunctivae normal, sclerae anicteric  ENMT: External Ears, Nose appear normal. Grossly normal hearing. Mucous membranes are moist.  Neck: Appears normal, supple, no cervical masses, normal ROM, no appreciable thyromegaly; no JVD Respiratory: Diminished to auscultation bilaterally with some unlabored breathing, no wheezing, rales, rhonchi or crackles. Normal respiratory effort and patient is not tachypenic. No accessory muscle use.  Cardiovascular: RRR, no murmurs / rubs / gallops. S1 and S2 auscultated.  Trace extremity edema.  Abdomen: Soft, non-tender, distended secondary to body habitus.  Bowel sounds positive.  GU: Deferred. Musculoskeletal: No clubbing / cyanosis of digits/nails. No joint deformity upper and lower extremities.  Skin: No rashes, lesions, ulcers on limited skin evaluation. No induration; Warm and dry.  Neurologic:  CN 2-12 grossly intact with no focal deficits. Romberg sign and cerebellar reflexes not assessed.  Psychiatric: Normal judgment and insight. Alert and oriented x 3. Normal mood and appropriate affect.   Data Reviewed: I have personally reviewed following labs and imaging studies  CBC: Recent Labs  Lab 09/04/19 1200 09/05/19 0436 09/06/19 0822 09/09/19 0359  WBC 13.7* 7.6 17.2* 10.9*  NEUTROABS 9.3*  --   --  9.5*  HGB 15.7 13.2 15.0 13.2  HCT 46.2 40.6 43.5 38.5*  MCV 103.6* 109.1* 104.1* 102.1*  PLT 142* 120* 139* 716*   Basic Metabolic Panel: Recent Labs  Lab 09/04/19 1200 09/04/19 2005 09/05/19 0436 09/06/19 0822 09/07/19 0437 09/09/19 0359 09/11/19 0819  NA   < >  --  138 137 137 138 139  137  K   < >  --  4.1  4.0 3.5 3.2* 3.7  3.6  CL   < >  --  101 97* 101 102 100  100  CO2   < >  --  26 26 25 28 26  25   GLUCOSE   < >  --  136* 137* 141* 151* 157*  158*  BUN   < >  --  18 23 25* 22 24*  23  CREATININE   < >  --  1.90* 1.48* 1.18 1.10 0.97  1.00  CALCIUM   < >  --  8.6* 8.7* 8.0* 8.0* 8.3*  8.1*  MG  --  1.3*  --   --   --   --  1.7  PHOS  --   --   --   --   --   --  1.9*   < > = values in this interval not displayed.   GFR: Estimated Creatinine Clearance: 98.3 mL/min (by C-G formula based on SCr of 1 mg/dL). Liver Function Tests: Recent Labs  Lab 09/04/19 1200 09/05/19 0436 09/06/19 0822 09/11/19 0819  AST 63* 48* 43* 30  ALT 26 24 28  47*  ALKPHOS 85 67 78 67  BILITOT 1.1 1.0 0.8 0.6  PROT 8.1 6.9 7.8 6.5  ALBUMIN 3.5 3.1* 3.5 3.2*   Recent Labs  Lab 09/04/19 1200  LIPASE 24   Recent Labs  Lab 09/04/19 1945  AMMONIA 24   Coagulation Profile: Recent Labs  Lab 09/04/19 2005  INR 1.2   Cardiac Enzymes: No results for input(s): CKTOTAL, CKMB, CKMBINDEX, TROPONINI in the last 168 hours. BNP (last 3 results) No results for input(s): PROBNP in the last 8760 hours. HbA1C: No results for input(s): HGBA1C in the last 72  hours. CBG: No results for input(s): GLUCAP in the last 168 hours. Lipid Profile: No results for input(s): CHOL, HDL, LDLCALC, TRIG, CHOLHDL, LDLDIRECT in the last 72 hours. Thyroid Function Tests: No results for input(s): TSH, T4TOTAL, FREET4, T3FREE, THYROIDAB in the last 72 hours. Anemia Panel: No results for input(s): VITAMINB12, FOLATE, FERRITIN, TIBC, IRON, RETICCTPCT in the last 72 hours. Sepsis Labs: No results for input(s): PROCALCITON, LATICACIDVEN in the last 168 hours.  Recent Results (from the past 240 hour(s))  Culture, blood (routine x 2)     Status: None   Collection Time: 09/04/19  9:40 PM   Specimen: BLOOD  Result Value Ref Range Status   Specimen Description BLOOD RIGHT ANTECUBITAL  Final   Special Requests   Final    BOTTLES DRAWN AEROBIC AND ANAEROBIC Blood Culture adequate volume   Culture   Final    NO GROWTH 5 DAYS Performed at [facility], [address] [place], [place] [address]    Report Status 09/10/2019 FINAL  Final  SARS Coronavirus 2 by RT PCR (hospital order, performed in [facility] hospital lab) Nasopharyngeal Nasopharyngeal Swab     Status: None   Collection Time: 09/04/19 10:04 PM   Specimen: Nasopharyngeal Swab  Result Value Ref Range Status   SARS Coronavirus 2 NEGATIVE NEGATIVE Final    Comment: (NOTE) SARS-CoV-2 target nucleic acids are NOT DETECTED.  The SARS-CoV-2 RNA is generally detectable in upper and lower respiratory specimens during the acute phase of infection. The lowest concentration of SARS-CoV-2 viral copies this assay can detect is 250 copies / mL. A negative result does not preclude SARS-CoV-2 infection and should not be used as the sole basis for treatment or other patient management decisions.  A negative result  may occur with improper specimen collection / handling, submission of specimen other than nasopharyngeal swab, presence of viral mutation(s) within the areas targeted by this assay, and inadequate  number of viral copies (<250 copies / mL). A negative result must be combined with clinical observations, patient history, and epidemiological information.  Fact Sheet for Patients:   StrictlyIdeas.no  Fact Sheet for Healthcare Providers: BankingDealers.co.za  This test is not yet approved or  cleared by the [place] FDA and has been authorized for detection and/or diagnosis of SARS-CoV-2 by FDA under an Emergency Use Authorization (EUA).  This EUA will remain in effect (meaning this test can be used) for the duration of the COVID-19 declaration under Section 564(b)(1) of the Act, 21 U.S.C. section 360bbb-3(b)(1), unless the authorization is terminated or revoked sooner.  Performed at [facility], [place] [address], [place], [place] [address]   Culture, blood (routine x 2)     Status: None   Collection Time: 09/04/19 10:10 PM   Specimen: BLOOD  Result Value Ref Range Status   Specimen Description BLOOD RIGHT ANTECUBITAL  Final   Special Requests   Final    BOTTLES DRAWN AEROBIC AND ANAEROBIC Blood Culture adequate volume   Culture   Final    NO GROWTH 5 DAYS Performed at [facility], [place] [address] [place], [place] [address]    Report Status 09/10/2019 FINAL  Final    RN Pressure Injury Documentation:     Estimated body mass index is 32.17 kg/m as calculated from the following:   Height as of this encounter: 6' (1.829 m).   Weight as of this encounter: 107.6 kg.  Malnutrition Type:      Malnutrition Characteristics:      Nutrition Interventions:    Radiology Studies: No results found.  Scheduled Meds: . dexamethasone (DECADRON) injection  8 mg Intravenous Q6H  . docusate sodium  100 mg Oral BID  . famotidine  20 mg Oral Daily  . feeding supplement (PRO-STAT SUGAR FREE 64)  30 mL Oral BID  . fluconazole  100 mg Oral Daily  . folic acid  1 mg Oral Daily  . gabapentin  100 mg Oral QHS  .  hydrALAZINE  25 mg Oral Q8H  . levETIRAcetam  500 mg Oral BID  . magic mouthwash  5 mL Oral QID  . nystatin  5 mL Oral QID  . pantoprazole  40 mg Oral BID AC  . sodium chloride flush  3 mL Intravenous Q12H  . sucralfate  1 g Oral TID WC & HS   Continuous Infusions: . lactated ringers 100 mL/hr at 09/11/19 0423    LOS: 7 days   Kerney Elbe, DO [facility] PAGER is on [place]  If 7PM-7AM, please contact night-coverage [email or web address]

## 2019-09-12 ENCOUNTER — Ambulatory Visit
Admission: RE | Admit: 2019-09-12 | Discharge: 2019-09-12 | Disposition: A | Discharge status: Home / Self Care | Payer: No Typology Code available for payment source | Source: Ambulatory Visit | Attending: Radiation Oncology | Admitting: Radiation Oncology

## 2019-09-12 ENCOUNTER — Other Ambulatory Visit: Payer: Self-pay | Admitting: Radiation Therapy

## 2019-09-12 ENCOUNTER — Other Ambulatory Visit: Payer: Self-pay | Admitting: *Deleted

## 2019-09-12 DIAGNOSIS — E876 Hypokalemia: Secondary | ICD-10-CM

## 2019-09-12 DIAGNOSIS — Z510 Encounter for antineoplastic radiation therapy: Secondary | ICD-10-CM | POA: Insufficient documentation

## 2019-09-12 DIAGNOSIS — D7589 Other specified diseases of blood and blood-forming organs: Secondary | ICD-10-CM

## 2019-09-12 DIAGNOSIS — C7931 Secondary malignant neoplasm of brain: Secondary | ICD-10-CM | POA: Insufficient documentation

## 2019-09-12 DIAGNOSIS — C3411 Malignant neoplasm of upper lobe, right bronchus or lung: Secondary | ICD-10-CM | POA: Insufficient documentation

## 2019-09-12 DIAGNOSIS — I82412 Acute embolism and thrombosis of left femoral vein: Secondary | ICD-10-CM

## 2019-09-12 LAB — CBC WITH DIFFERENTIAL/PLATELET
Abs Immature Granulocytes: 0.37 K/uL — ABNORMAL HIGH (ref 0.00–0.07)
Basophils Absolute: 0 K/uL (ref 0.0–0.1)
Basophils Relative: 0 %
Eosinophils Absolute: 0 K/uL (ref 0.0–0.5)
Eosinophils Relative: 0 %
HCT: 41.6 % (ref 39.0–52.0)
Hemoglobin: 14.8 g/dL (ref 13.0–17.0)
Immature Granulocytes: 2 %
Lymphocytes Relative: 4 %
Lymphs Abs: 0.7 K/uL (ref 0.7–4.0)
MCH: 35.4 pg — ABNORMAL HIGH (ref 26.0–34.0)
MCHC: 35.6 g/dL (ref 30.0–36.0)
MCV: 99.5 fL (ref 80.0–100.0)
Monocytes Absolute: 0.7 K/uL (ref 0.1–1.0)
Monocytes Relative: 4 %
Neutro Abs: 15 K/uL — ABNORMAL HIGH (ref 1.7–7.7)
Neutrophils Relative %: 90 %
Platelets: 142 K/uL — ABNORMAL LOW (ref 150–400)
RBC: 4.18 MIL/uL — ABNORMAL LOW (ref 4.22–5.81)
RDW: 12.7 % (ref 11.5–15.5)
WBC: 16.8 K/uL — ABNORMAL HIGH (ref 4.0–10.5)
nRBC: 0 % (ref 0.0–0.2)

## 2019-09-12 LAB — COMPREHENSIVE METABOLIC PANEL WITH GFR
ALT: 49 U/L — ABNORMAL HIGH (ref 0–44)
AST: 28 U/L (ref 15–41)
Albumin: 3.3 g/dL — ABNORMAL LOW (ref 3.5–5.0)
Alkaline Phosphatase: 65 U/L (ref 38–126)
Anion gap: 12 (ref 5–15)
BUN: 30 mg/dL — ABNORMAL HIGH (ref 8–23)
CO2: 23 mmol/L (ref 22–32)
Calcium: 8 mg/dL — ABNORMAL LOW (ref 8.9–10.3)
Chloride: 98 mmol/L (ref 98–111)
Creatinine, Ser: 1.01 mg/dL (ref 0.61–1.24)
GFR calc Af Amer: >60 mL/min
GFR calc non Af Amer: >60 mL/min
Glucose, Bld: 159 mg/dL — ABNORMAL HIGH (ref 70–99)
Potassium: 3.2 mmol/L — ABNORMAL LOW (ref 3.5–5.1)
Sodium: 133 mmol/L — ABNORMAL LOW (ref 135–145)
Total Bilirubin: 0.7 mg/dL (ref 0.3–1.2)
Total Protein: 6.5 g/dL (ref 6.5–8.1)

## 2019-09-12 LAB — PHOSPHORUS: Phosphorus: 2.5 mg/dL (ref 2.5–4.6)

## 2019-09-12 LAB — MAGNESIUM: Magnesium: 1.7 mg/dL (ref 1.7–2.4)

## 2019-09-12 MED ORDER — HYDRALAZINE HCL 20 MG/ML IJ SOLN
10.0000 mg | Freq: Four times a day (QID) | INTRAMUSCULAR | Status: DC | PRN
  Filled 2019-09-12: qty 1

## 2019-09-12 MED ORDER — MAGNESIUM SULFATE 2 GM/50ML IV SOLN
2.0000 g | Freq: Once | INTRAVENOUS | Status: AC
  Administered 2019-09-12: 2 g via INTRAVENOUS
  Filled 2019-09-12: qty 50

## 2019-09-12 MED ORDER — POTASSIUM CHLORIDE CRYS ER 20 MEQ PO TBCR
40.0000 meq | EXTENDED_RELEASE_TABLET | Freq: Two times a day (BID) | ORAL | Status: AC
  Administered 2019-09-12 (×2): 40 meq via ORAL
  Filled 2019-09-12 (×2): qty 2

## 2019-09-12 MED ORDER — TOLNAFTATE 1 % EX CREA
TOPICAL_CREAM | Freq: Two times a day (BID) | CUTANEOUS | Status: DC
  Administered 2019-09-15: 1 via TOPICAL
  Filled 2019-09-12 (×2): qty 15

## 2019-09-12 MED ORDER — LORAZEPAM 1 MG PO TABS
1.0000 mg | ORAL_TABLET | Freq: Four times a day (QID) | ORAL | Status: DC | PRN
  Administered 2019-09-12 – 2019-09-14 (×3): 1 mg via ORAL
  Filled 2019-09-12 (×3): qty 1

## 2019-09-12 NOTE — Progress Notes (Signed)
  Speech Language Pathology Treatment: Cognitive-Linquistic  Patient Details Name: Daniel Bryant MRN: [id number] DOB: [birth year] Today's Date: 09/12/2019 Time: 3361-2244 SLP Time Calculation (min) (ACUTE ONLY): 35 min  Assessment / Plan / Recommendation Clinical Impression  Pt with persisting deficits in short-term recall, organization, and insight. He acknowledges changes in cognition in a broad way, but has a tendency to deny impact on real-world function.  For example, he insists that he can continue to drive through treatment and that he can manage medications independently (see OT's note re: Pillbox test).  Pt demonstrated immediate recall of word and number sequences, required verbal/visual cues for recall after five-minute delay (705 accuracy).  Discussed compensatory use of note-taking/planners.  Verbalizes frustration re: current living situation and lack of family support.  Pt discussed possibility of staying with his cousin, Daniel Bryant, upon D/C.  He is planning to visit with him today to discuss this option.   HPI HPI: 59 yo male with onset of AMS and hallucinations of bugs in the house, missed recent appointments and general confusion. PMHx:  asthma, CA to lung, fatty liver, atherosclerosis, seizures, pacemaker, PTSD, SSS, GERD, OSA, HTN, COPD, chest tube, memory changes, esophageal mets, R parietal mets. Head CT 19 mm partially calcified mass in the right insular cortex with a large amount of surrounding vasogenic edema. This shows minimal enhancement however is consistent with metastatic disease. Additional areas of vasogenic edema bilaterally also consistent with metastatic disease although these do not show abnormal enhancement. Lack of enhancement may be due to treated tumor.      SLP Plan  Continue with current plan of care       Recommendations                   Oral Care Recommendations: Oral care BID SLP Visit Diagnosis: Cognitive communication deficit  (L75.300) Plan: Continue with current plan of care       GO              Daniel Bryant, Daniel Bryant CCC/SLP Acute Rehabilitation Services Office number [phone] Pager [phone]   Daniel Bryant 09/12/2019, 10:05 AM

## 2019-09-12 NOTE — Progress Notes (Signed)
PROGRESS NOTE    Daniel Bryant  NFA:[id number] DOB: [birth year] DOA: 09/04/2019 PCP: [facility]   Brief Narrative:  HPI per Dr. Berle Mull on 09/04/19 HPI: Daniel Bryant is a 59 y.o. male with Past medical history of stage IV non-small cell lung cancer/adenocarcinoma follows up with Dr. Julien Nordmann currently on carboplatin Alimta, last dose June 06, 2019, no-show for April and May appointments, drug-induced rash secondary to Alimta, HTN, GERD, neuropathy, depression, hyperlipidemia. Patient presents with complaints of chest pain.  Reportedly chest pain began on the night of 09/03/2019 associated with headache shortness of breath nausea and upset stomach.  He also had vomiting episode at home.  EMS was called due to chest pain.  Patient lives alone but reportedly has home health who comes home. On further evaluation patient tells me that he may have a fall, found himself on the floor 1 day, he also has tongue bite but he does not know when it occurred. He denies having any focal deficit right now.  He reports dizziness but no vertigo.  On exam he has blurry vision but denies any history of the same at home. No loss of control of bowel or bladder. Reportedly he is compliant with his Lovenox injections. No diarrhea no constipation reported.  No bleeding reported by the patient. Currently does not have any chest pain. Reports forgetfulness. Does not have any family member to help him assist with decision-making process.  Does not have any HCPOA.  Denies any alcohol abuse or drug abuse.  No smoking. Patient has been seeing bugs crawling all over the room.  He also saw bubbles moving on his arm during examination.  ED Course: Presents with complaints of chest pain.  Initial troponin EKG unremarkable.  Found to have AKI.  Chest x-ray unremarkable as well.  CT without contrast was performed which was negative for any significant acute abnormality. Patient had significant confusion as  well as hallucination.  Patient was seeing bugs crawling all over.  Therefore CT head was performed which showed metastatic lesions with midline shift.  Neurosurgery was consulted they will evaluate the patient in the morning.  Patient was referred for admission.  At his baseline ambulates without assistance independent for most of his ADL;  manages his medication on his own.  **Interim History Patient was transferred to [facility] on 09/09/2019 for whole brain radiation at the recommendation of neurosurgery.  His AKI is improved and head CT that was obtained showed metastatic disease along with midline shift.  He started brain radiation yesterday and is continued on dexamethasone.  His WBC is continuing to elevated but in the setting of steroid demargination.  He is going to check with some family members if he can come stay with them as he does not have a safe discharge disposition to be discharged home at this time he has community given that PT OT recommending 24-hour supervision and assistance and patient refuses to go to SNF.  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  -Patient transferred to [facility] on 6/28 to initiate XRT and this was done yesterday.  -My colleague Discussed with neuro oncology; patient will be managed by radiation/medical oncology and seen by neuro oncology as  needed and likely while receiving radiation -Continue Dexamethasone IV 8 mg q6h, Keppra 500 mg po BID (seizure prophylaxis) -Radiation oncology recommendations: XRT and to undergo whole Brain Radiation yesterday and today -C/w Supportive Care and now Discontinued IVF -C/w Antiemetics with po/IV Ondansetron 4 mg q6hprn Nausea   Delirium Confusion -Secondary to above. Appears to be improved days intermittent forgetfulness.  Oral Candidiasis Toenail fungus -Patient is on both Nystatin and Magic Mouthwash along with Fluconazole -Continue Nystatin and Fluconazole and will discontinue Magic Mouthwash -Have added Tinactin topically for his toenails  Chest Pain -Appears to have been secondary to GERD/esophagitis. Resolved. -Continue with PPI as below and also continue with Maalox/Mylanta 30 mL p.o. every 4 hours as needed indigestion  GERD Esophagitis -Continue Pantoprazole 40 mg po BID and Famotidine 20 mg po Daily; Also has Maalox and Sucralafate 1 gram po TIDwm and Bedtime   AKI on CKD stage IIIa, improved -Baseline creatinine of 1.1. Peak creatinine of 2.12 which has now resolved back to baseline -Patient's BUN/Cr is now 30/1.01 -Will stop IVF Hydration with LR at 100 mL/hr -Avoid nephrotoxic medications, contrast dyes, hypotension and renally adjust medications -Continue to monitor and trend renal function repeat CMP in a.m.  Chronic DVT -S/p thrombectomy and started on Lovenox as an outpatient three months ago.  -Not on anticoagulation secondary to brain lesion with resultant midline shift per recommendations by neurosurgery.   Hypokalemia -Intermittent and now K+ is 3.2 -Replete with p.o. KCl 40 mg twice daily x2 doses -Continue to Monitor and Replete as necessary -Repeat CMP in the AM   Essential Hypertension -Continue Hydralazine 25 mg q8h -Stopped LR at 100 mL/hr yesterday (had been there since 09/04/19)  Sick Sinus Syndrome -S/p pacemaker.  -MRI  incompatible.  Elevated ALT -Mild with an ALT of 47 and today is 49; Likely in the setting of Steroids  -Continue to Monitor carefully and repeat CMP in the AM   COPD  -Stable and currently not in exacerbation -If necessary will add PRN Nebs   Hypophosphatemia -Patient's Phos Level this AM was 2.5 -Replete with po K Phos Neutral 500 mg po BID x2 yesterday -Continue to Monitor and Replete as Necessary -Repeat Phos Level in the AM  Leukocytosis -No infectious evidence.  -Likely related to steroid demargination.  -Trended down as WBC went from 17.2 -> 10.9 -> 16.8 and again is 16.8 today -Not repeated today but will need continual monitoring -Continue to Monitor for S/Sx of Infection -Repeat CBC in the AM.  Hyperglycemia -Blood Sugars have been elevated on Daily BMP/CMP -CBG's ranging from 136-159 -Continue to Monitor and Trend Blood Sugars carefully and if necessary will need to be placed on Sensitive Novolog SSI AC  Hyponatremia -Mild with a sodium 132 -Continue monitor and trend and repeat CMP in a.m.  Obesity -Estimated body mass index is 30.44 kg/m as calculated from the following:   Height as of this encounter: 6' (1.829 m).   Weight as of this encounter: 101.8 kg. -Continued Weight Loss and Dietary Counseling given   DVT prophylaxis: SCDs Code Status: FULL CODE  Family Communication: No family present at bedside  Disposition Plan: Pending further clearance from Specialists (Neuro-Onc, Radiation Onc, and Medical Onc) and once he has a safe discharge disposition as he wants to go home however it is unsafe for him to go home as he needs 24-hour supervision is asking his family received a can come by  Status is: Inpatient  Remains inpatient appropriate because:Ongoing diagnostic testing needed not appropriate for outpatient work up, IV treatments appropriate due to intensity of illness or inability to take PO and Inpatient level of care appropriate due to severity of  illness   Dispo: The patient is from: Home              Anticipated d/c is to: TBD              Anticipated d/c date is: 1 day              Patient currently is not medically stable to d/c.  Consultants:   Neurosurgery  Neuro Oncology   Medical Oncology  Radiation Oncology  Palliative Care Medicine   Procedures:  EEG (09/05/2019) IMPRESSION: This studyshowed evidence of potential epileptogenicity as well as cortical dysfunction in left posterior quadrant secondary to underlying structural abnormality. Additionally, there is evidence of mild to moderate diffuse encephalopathy, non specific to etiology.No seizures were seen throughout the recording.  Antimicrobials:  Anti-infectives (From admission, onward)   Start     Dose/Rate Route Frequency Ordered Stop   09/09/19 1230  fluconazole (DIFLUCAN) tablet 100 mg     Discontinue     100 mg Oral Daily 09/09/19 1220       Subjective: Seen and examined at bedside and he was sitting beside his bed about to work with speech therapy.  No nausea or vomiting.  Continues to state he has some puffiness in his legs that were not there before.  Denies any complaints at this time but does have some neck soreness and complaining of some mouth burning.  No other concerns or complaints at this time except that he wants something for his feet and toenail fungus as well..  Objective: Vitals:   09/11/19 1148 09/11/19 1420 09/11/19 2122 09/12/19 0556  BP: (!) 163/94 (!) 155/98 (!) 152/92 (!) 173/103  Pulse: 68 64 71 72  Resp: 16 18 13 15   Temp: 97.8 F (36.6 C) 97.9 F (36.6 C) 98.7 F (37.1 C) 98.1 F (36.7 C)  TempSrc: Oral Oral Oral Oral  SpO2:  97% 96% 96%  Weight:    101.8 kg  Height:        Intake/Output Summary (Last 24 hours) at 09/12/2019 1823 Last data filed at 09/12/2019 1330 Gross per 24 hour  Intake 1440 ml  Output --  Net 1440 ml   Filed Weights   09/10/19 0626 09/11/19 0609 09/12/19 0556  Weight: 105.6 kg 107.6 kg  101.8 kg   Examination: Physical Exam:  Constitutional: WN/WD obese Caucasian male currently in NAD and appears calm and comfortable Eyes: Lids and conjunctivae normal, sclerae anicteric  ENMT: External Ears, Nose appear normal. Grossly normal hearing.  Neck: Appears normal, supple, no cervical masses, normal ROM, no appreciable thyromegaly; no JVD Respiratory: Diminished to auscultation bilaterally, no wheezing, rales, rhonchi or crackles. Normal respiratory effort and patient is not tachypenic. No accessory muscle use.  Unlabored breathing Cardiovascular: RRR, no murmurs / rubs / gallops. S1 and S2 auscultated.  1+ extremity edema. Abdomen: Soft, non-tender, distended secondary body habitus. Bowel sounds positive.  GU: Deferred. Musculoskeletal: No clubbing / cyanosis of digits/nails.  No joint deformity upper and lower extremities.  Skin: No rashes, lesions, ulcers on limited skin evaluation. No induration; Warm and dry.  Neurologic: CN 2-12 grossly intact with no focal deficits. Romberg sign and cerebellar reflexes not assessed.  Psychiatric: Normal judgment and insight. Alert and oriented x 3. Normal mood and appropriate affect.    Data Reviewed: I have personally reviewed following labs and imaging studies  CBC: Recent Labs  Lab 09/06/19 0822 09/09/19 0359 09/11/19 0819 09/12/19 0548  WBC 17.2* 10.9* 16.8* 16.8*  NEUTROABS  --  9.5* 15.0* 15.0*  HGB 15.0 13.2 14.9 14.8  HCT 43.5 38.5* 42.1 41.6  MCV 104.1* 102.1* 100.7* 99.5  PLT 139* 122* 163 188*   Basic Metabolic Panel: Recent Labs  Lab 09/06/19 0822 09/07/19 0437 09/09/19 0359 09/11/19 0819 09/12/19 0548  NA 137 137 138 139  137 133*  K 4.0 3.5 3.2* 3.7  3.6 3.2*  CL 97* 101 102 100  100 98  CO2 26 25 28 26  25 23   GLUCOSE 137* 141* 151* 157*  158* 159*  BUN 23 25* 22 24*  23 30*  CREATININE 1.48* 1.18 1.10 0.97  1.00 1.01  CALCIUM 8.7* 8.0* 8.0* 8.3*  8.1* 8.0*  MG  --   --   --  1.7 1.7  PHOS  --    --   --  1.9* 2.5   GFR: Estimated Creatinine Clearance: 94.8 mL/min (by C-G formula based on SCr of 1.01 mg/dL). Liver Function Tests: Recent Labs  Lab 09/06/19 0822 09/11/19 0819 09/12/19 0548  AST 43* 30 28  ALT 28 47* 49*  ALKPHOS 78 67 65  BILITOT 0.8 0.6 0.7  PROT 7.8 6.5 6.5  ALBUMIN 3.5 3.2* 3.3*   No results for input(s): LIPASE, AMYLASE in the last 168 hours. No results for input(s): AMMONIA in the last 168 hours. Coagulation Profile: No results for input(s): INR, PROTIME in the last 168 hours. Cardiac Enzymes: No results for input(s): CKTOTAL, CKMB, CKMBINDEX, TROPONINI in the last 168 hours. BNP (last 3 results) No results for input(s): PROBNP in the last 8760 hours. HbA1C: No results for input(s): HGBA1C in the last 72 hours. CBG: No results for input(s): GLUCAP in the last 168 hours. Lipid Profile: No results for input(s): CHOL, HDL, LDLCALC, TRIG, CHOLHDL, LDLDIRECT in the last 72 hours. Thyroid Function Tests: No results for input(s): TSH, T4TOTAL, FREET4, T3FREE, THYROIDAB in the last 72 hours. Anemia Panel: No results for input(s): VITAMINB12, FOLATE, FERRITIN, TIBC, IRON, RETICCTPCT in the last 72 hours. Sepsis Labs: No results for input(s): PROCALCITON, LATICACIDVEN in the last 168 hours.  Recent Results (from the past 240 hour(s))  Culture, blood (routine x 2)     Status: None   Collection Time: 09/04/19  9:40 PM   Specimen: BLOOD  Result Value Ref Range Status   Specimen Description BLOOD RIGHT ANTECUBITAL  Final   Special Requests   Final    BOTTLES DRAWN AEROBIC AND ANAEROBIC Blood Culture adequate volume   Culture   Final    NO GROWTH 5 DAYS Performed at [facility], [address] [place], [place] [address]    Report Status 09/10/2019 FINAL  Final  SARS Coronavirus 2 by RT PCR (hospital order, performed in [facility] hospital lab) Nasopharyngeal Nasopharyngeal Swab     Status: None   Collection Time: 09/04/19 10:04 PM    Specimen: Nasopharyngeal Swab  Result Value Ref Range Status   SARS Coronavirus 2 NEGATIVE  NEGATIVE Final    Comment: (NOTE) SARS-CoV-2 target nucleic acids are NOT DETECTED.  The SARS-CoV-2 RNA is generally detectable in upper and lower respiratory specimens during the acute phase of infection. The lowest concentration of SARS-CoV-2 viral copies this assay can detect is 250 copies / mL. A negative result does not preclude SARS-CoV-2 infection and should not be used as the sole basis for treatment or other patient management decisions.  A negative result may occur with improper specimen collection / handling, submission of specimen other than nasopharyngeal swab, presence of viral mutation(s) within the areas targeted by this assay, and inadequate number of viral copies (<250 copies / mL). A negative result must be combined with clinical observations, patient history, and epidemiological information.  Fact Sheet for Patients:   StrictlyIdeas.no  Fact Sheet for Healthcare Providers: BankingDealers.co.za  This test is not yet approved or  cleared by the [place] FDA and has been authorized for detection and/or diagnosis of SARS-CoV-2 by FDA under an Emergency Use Authorization (EUA).  This EUA will remain in effect (meaning this test can be used) for the duration of the COVID-19 declaration under Section 564(b)(1) of the Act, 21 U.S.C. section 360bbb-3(b)(1), unless the authorization is terminated or revoked sooner.  Performed at [facility], [place] [address], [place], [place] [address]   Culture, blood (routine x 2)     Status: None   Collection Time: 09/04/19 10:10 PM   Specimen: BLOOD  Result Value Ref Range Status   Specimen Description BLOOD RIGHT ANTECUBITAL  Final   Special Requests   Final    BOTTLES DRAWN AEROBIC AND ANAEROBIC Blood Culture adequate volume   Culture   Final    NO GROWTH 5 DAYS Performed at  [facility], [place] [address] [place], [place] [address]    Report Status 09/10/2019 FINAL  Final    RN Pressure Injury Documentation:     Estimated body mass index is 30.44 kg/m as calculated from the following:   Height as of this encounter: 6' (1.829 m).   Weight as of this encounter: 101.8 kg.  Malnutrition Type:      Malnutrition Characteristics:      Nutrition Interventions:    Radiology Studies: No results found.  Scheduled Meds: . dexamethasone (DECADRON) injection  8 mg Intravenous Q6H  . docusate sodium  100 mg Oral BID  . famotidine  20 mg Oral Daily  . feeding supplement (PRO-STAT SUGAR FREE 64)  30 mL Oral BID  . fluconazole  100 mg Oral Daily  . folic acid  1 mg Oral Daily  . gabapentin  100 mg Oral QHS  . hydrALAZINE  25 mg Oral Q8H  . levETIRAcetam  500 mg Oral BID  . nystatin  5 mL Oral QID  . pantoprazole  40 mg Oral BID AC  . potassium chloride  40 mEq Oral BID  . sodium chloride flush  3 mL Intravenous Q12H  . sucralfate  1 g Oral TID WC & HS  . tolnaftate   Topical BID   Continuous Infusions:   LOS: 8 days   Kerney Elbe, DO [facility] PAGER is on AMION  If 7PM-7AM, please contact night-coverage [email or web address]

## 2019-09-13 ENCOUNTER — Encounter: Payer: Self-pay | Admitting: *Deleted

## 2019-09-13 ENCOUNTER — Ambulatory Visit
Admit: 2019-09-13 | Discharge: 2019-09-13 | Disposition: A | Discharge status: Home / Self Care | Payer: No Typology Code available for payment source | Attending: Radiation Oncology | Admitting: Radiation Oncology

## 2019-09-13 LAB — CBC WITH DIFFERENTIAL/PLATELET
Abs Immature Granulocytes: 0.48 10*3/uL — ABNORMAL HIGH (ref 0.00–0.07)
Basophils Absolute: 0 10*3/uL (ref 0.0–0.1)
Basophils Relative: 0 %
Eosinophils Absolute: 0 10*3/uL (ref 0.0–0.5)
Eosinophils Relative: 0 %
HCT: 37.9 % — ABNORMAL LOW (ref 39.0–52.0)
Hemoglobin: 13.4 g/dL (ref 13.0–17.0)
Immature Granulocytes: 3 %
Lymphocytes Relative: 4 %
Lymphs Abs: 0.7 10*3/uL (ref 0.7–4.0)
MCH: 35.4 pg — ABNORMAL HIGH (ref 26.0–34.0)
MCHC: 35.4 g/dL (ref 30.0–36.0)
MCV: 100.3 fL — ABNORMAL HIGH (ref 80.0–100.0)
Monocytes Absolute: 0.8 10*3/uL (ref 0.1–1.0)
Monocytes Relative: 5 %
Neutro Abs: 14.9 10*3/uL — ABNORMAL HIGH (ref 1.7–7.7)
Neutrophils Relative %: 88 %
Platelets: 124 10*3/uL — ABNORMAL LOW (ref 150–400)
RBC: 3.78 MIL/uL — ABNORMAL LOW (ref 4.22–5.81)
RDW: 13 % (ref 11.5–15.5)
WBC: 16.9 10*3/uL — ABNORMAL HIGH (ref 4.0–10.5)
nRBC: 0 % (ref 0.0–0.2)

## 2019-09-13 LAB — COMPREHENSIVE METABOLIC PANEL
ALT: 47 U/L — ABNORMAL HIGH (ref 0–44)
AST: 21 U/L (ref 15–41)
Albumin: 2.9 g/dL — ABNORMAL LOW (ref 3.5–5.0)
Alkaline Phosphatase: 58 U/L (ref 38–126)
Anion gap: 9 (ref 5–15)
BUN: 33 mg/dL — ABNORMAL HIGH (ref 8–23)
CO2: 28 mmol/L (ref 22–32)
Calcium: 8.1 mg/dL — ABNORMAL LOW (ref 8.9–10.3)
Chloride: 100 mmol/L (ref 98–111)
Creatinine, Ser: 1.16 mg/dL (ref 0.61–1.24)
GFR calc Af Amer: >60 mL/min (ref >60)
GFR calc non Af Amer: >60 mL/min (ref >60)
Glucose, Bld: 157 mg/dL — ABNORMAL HIGH (ref 70–99)
Potassium: 4 mmol/L (ref 3.5–5.1)
Sodium: 137 mmol/L (ref 135–145)
Total Bilirubin: 0.6 mg/dL (ref 0.3–1.2)
Total Protein: 5.6 g/dL — ABNORMAL LOW (ref 6.5–8.1)

## 2019-09-13 LAB — MAGNESIUM: Magnesium: 2 mg/dL (ref 1.7–2.4)

## 2019-09-13 LAB — PHOSPHORUS: Phosphorus: 2.5 mg/dL (ref 2.5–4.6)

## 2019-09-13 NOTE — Progress Notes (Signed)
PROGRESS NOTE    Daniel Bryant  PRF:[id number] DOB: May 22, 1960 DOA: 09/04/2019 PCP: [facility], [facility]   Brief Narrative:  HPI per Dr. Berle Mull on 09/04/19 HPI: Daniel Bryant is a 59 y.o. male with Past medical history of stage IV non-small cell lung cancer/adenocarcinoma follows up with Dr. Julien Nordmann currently on carboplatin Alimta, last dose June 06, 2019, no-show for April and May appointments, drug-induced rash secondary to Alimta, HTN, GERD, neuropathy, depression, hyperlipidemia. Patient presents with complaints of chest pain.  Reportedly chest pain began on the night of 09/03/2019 associated with headache shortness of breath nausea and upset stomach.  He also had vomiting episode at home.  EMS was called due to chest pain.  Patient lives alone but reportedly has home health who comes home. On further evaluation patient tells me that he may have a fall, found himself on the floor 1 day, he also has tongue bite but he does not know when it occurred. He denies having any focal deficit right now.  He reports dizziness but no vertigo.  On exam he has blurry vision but denies any history of the same at home. No loss of control of bowel or bladder. Reportedly he is compliant with his Lovenox injections. No diarrhea no constipation reported.  No bleeding reported by the patient. Currently does not have any chest pain. Reports forgetfulness. Does not have any family member to help him assist with decision-making process.  Does not have any HCPOA.  Denies any alcohol abuse or drug abuse.  No smoking. Patient has been seeing bugs crawling all over the room.  He also saw bubbles moving on his arm during examination.  ED Course: Presents with complaints of chest pain.  Initial troponin EKG unremarkable.  Found to have AKI.  Chest x-ray unremarkable as well.  CT without contrast was performed which was negative for any significant acute abnormality. Patient had significant confusion as  well as hallucination.  Patient was seeing bugs crawling all over.  Therefore CT head was performed which showed metastatic lesions with midline shift.  Neurosurgery was consulted they will evaluate the patient in the morning.  Patient was referred for admission.  At his baseline ambulates without assistance independent for most of his ADL;  manages his medication on his own.  **Interim History Patient was transferred to [facility] on 09/09/2019 for whole brain radiation at the recommendation of neurosurgery.  His AKI is improved and head CT that was obtained showed metastatic disease along with midline shift.  He started brain radiation yesterday and is continued on dexamethasone.  His WBC is continuing to elevated but in the setting of steroid demargination.  He is going to check with some family members if he can come stay with them as he does not have a safe discharge disposition to be discharged home at this time he has community given that PT OT recommending 24-hour supervision and assistance and patient refuses to go to SNF [facility] is working on trying to find patient assistance to have a safe discharge disposition given that he will need multiple radiation treatments.  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  -Patient transferred to [facility] on 6/28 to initiate XRT and this was done  yesterday.  -My colleague Discussed with neuro oncology; patient will be managed by radiation/medical oncology and seen by neuro oncology as needed and likely while receiving radiation -Continue Dexamethasone IV 8 mg q6h, Keppra 500 mg po BID (seizure prophylaxis) -Radiation oncology recommendations: XRT and to undergo whole Brain Radiation again today -C/w Supportive Care and now Discontinued IVF -C/w Antiemetics with po/IV Ondansetron 4 mg q6hprn Nausea   Delirium Confusion, improving -Secondary to above. Appears to be improved but he does have intermittent forgetfulness.  Oral Candidiasis Toenail fungus -Patient is on both Nystatin and Magic Mouthwash along with Fluconazole -Continue Nystatin and Fluconazole and will discontinue Magic Mouthwash -Have added Tinactin topically for his toenails but he does not want this.  He wants Fungi-Nail  Chest Pain -Appears to have been secondary to GERD/esophagitis. Resolved. -Continue with PPI as below and also continue with Maalox/Mylanta 30 mL p.o. every 4 hours as needed indigestion  GERD Esophagitis -Continue Pantoprazole 40 mg po BID and Famotidine 20 mg po Daily; Also has Maalox and Sucralafate 1 gram po TIDwm and Bedtime   AKI on CKD stage IIIa, improved -Baseline creatinine of 1.1. Peak creatinine of 2.12 which has now resolved back to baseline -Patient's BUN/Cr is now 30/1.01 yesterday and slightly bumped to 33/1.16 today -Will stop IVF Hydration with LR at 100 mL/hr -Avoid nephrotoxic medications, contrast dyes, hypotension and renally adjust medications -Continue to monitor and trend renal function repeat CMP in a.m.  Chronic DVT -S/p thrombectomy and started on Lovenox as an outpatient three months ago.  -Not on anticoagulation secondary to brain lesion with resultant midline shift per recommendations by neurosurgery.   Hypokalemia -Intermittent and now K+ is improved and is now 4.0 -Continue to Monitor and Replete as  necessary -Repeat CMP in the AM   Essential Hypertension -Continue Hydralazine 25 mg q8h -Stopped LR at 100 mL/hr yesterday (had been there since 09/04/19)  Sick Sinus Syndrome -S/p pacemaker.  -MRI incompatible.  Elevated ALT -Mild with an ALT of 47; Likely in the setting of Steroids  -Continue to Monitor carefully and repeat CMP in the AM   COPD  -Stable and currently not in exacerbation -If necessary will add PRN Nebs   Hypophosphatemia -Patient's Phos Level this AM was 2.5 -Continue to Monitor and Replete as Necessary -Repeat Phos Level in the AM  Leukocytosis -No infectious evidence.  -Likely related to steroid demargination.  -Trended down as WBC went from 17.2 -> 10.9 -> 16.8 -> 16.9 -Not repeated today but will need continual monitoring -Continue to Monitor for S/Sx of Infection -Repeat CBC in the AM.  Hyperglycemia -Blood Sugars have been elevated on Daily BMP/CMP -CBG's ranging from 136-159 -Continue to Monitor and Trend Blood Sugars carefully and if necessary will need to be placed on Sensitive Novolog SSI AC  Hyponatremia -Mild with a sodium 132 is now improved to 137 -Continue monitor and trend and repeat CMP in a.m.  Obesity -Estimated body mass index is 30.44 kg/m as calculated from the following:   Height as of this encounter: 6' (1.829 m).   Weight as of this encounter: 101.8 kg. -Continued Weight  Loss and Dietary Counseling given   DVT prophylaxis: SCDs Code Status: FULL CODE  Family Communication: No family present at bedside  Disposition Plan: Pending further clearance from Specialists (Neuro-Onc, Radiation Onc, and Medical Onc) and once he has a safe discharge disposition as he wants to go home however it is unsafe for him to go home as he needs 24-hour supervision; he is currently refusing SNF and will need to have a safe discharge disposition implemented prior to [place] releasing him from the hospital  Status is: Inpatient  Remains  inpatient appropriate because:Ongoing diagnostic testing needed not appropriate for outpatient work up, IV treatments appropriate due to intensity of illness or inability to take PO and Inpatient level of care appropriate due to severity of illness   Dispo: The patient is from: Home              Anticipated d/c is to: TBD              Anticipated d/c date is: 1-2 days              Patient currently is not medically stable to d/c.  Consultants:   Neurosurgery  Neuro Oncology   Medical Oncology  Radiation Oncology  Palliative Care Medicine   Procedures:  EEG (09/05/2019) IMPRESSION: This studyshowed evidence of potential epileptogenicity as well as cortical dysfunction in left posterior quadrant secondary to underlying structural abnormality. Additionally, there is evidence of mild to moderate diffuse encephalopathy, non specific to etiology.No seizures were seen throughout the recording.  Antimicrobials:  Anti-infectives (From admission, onward)   Start     Dose/Rate Route Frequency Ordered Stop   09/09/19 1230  fluconazole (DIFLUCAN) tablet 100 mg     Discontinue     100 mg Oral Daily 09/09/19 1220       Subjective: Seen and examined at bedside and he was sitting beside and he denies any chest pain lightheadedness or dizziness.  Still has some leg puffiness.  States that he did not sleep very well last night.  Has given the [facility] his friends numbers to see if they can assist him after discharge given that his family is not an option as they live in [place].  He will need to travel frequently and daily for his radiation treatments.  Currently still refusing SNF.  Objective: Vitals:   09/12/19 1842 09/12/19 2231 09/13/19 0645 09/13/19 1303  BP: (!) 188/102 (!) 154/98 (!) 150/96 (!) 153/92  Pulse: 75 86 85 75  Resp: 18 16 16 20   Temp: (!) 97.5 F (36.4 C) 98.2 F (36.8 C) 98.1 F (36.7 C) 97.9 F (36.6 C)  TempSrc: Oral  Oral Oral  SpO2: 93% 95% 96% 98%  Weight:       Height:        Intake/Output Summary (Last 24 hours) at 09/13/2019 1404 Last data filed at 09/12/2019 1839 Gross per 24 hour  Intake 480 ml  Output --  Net 480 ml   Filed Weights   09/10/19 0626 09/11/19 0609 09/12/19 0556  Weight: 105.6 kg 107.6 kg 101.8 kg   Examination: Physical Exam:  Constitutional: WN/WD obese Caucasian male currently in no acute distress appears calm and sitting up in the bed watching television Eyes: Lids and conjunctivae normal, sclerae anicteric  ENMT: External Ears, Nose appear normal. Grossly normal hearing. Neck: Appears normal, supple, no cervical masses, normal ROM, no appreciable thyromegaly; no JVD Respiratory: Diminished to auscultation bilaterally, no wheezing, rales, rhonchi or crackles. Normal respiratory effort  and patient is not tachypenic. No accessory muscle use.  Cardiovascular: RRR, no murmurs / rubs / gallops. S1 and S2 auscultated.  1+ extremity edema. Abdomen: Soft, non-tender, distended secondary body habitus. Bowel sounds positive.  GU: Deferred. Musculoskeletal: No clubbing / cyanosis of digits/nails. No joint deformity upper and lower extremities.  Skin: No rashes, lesions, ulcers on limited skin evaluation. No induration; Warm and dry.  Neurologic: CN 2-12 grossly intact with no focal deficits. Romberg sign and cerebellar reflexes not assessed.  Psychiatric: Normal judgment and insight. Alert and oriented x 3. Normal mood and appropriate affect.   Data Reviewed: I have personally reviewed following labs and imaging studies  CBC: Recent Labs  Lab 09/09/19 0359 09/11/19 0819 09/12/19 0548 09/13/19 0510  WBC 10.9* 16.8* 16.8* 16.9*  NEUTROABS 9.5* 15.0* 15.0* 14.9*  HGB 13.2 14.9 14.8 13.4  HCT 38.5* 42.1 41.6 37.9*  MCV 102.1* 100.7* 99.5 100.3*  PLT 122* 163 142* 803*   Basic Metabolic Panel: Recent Labs  Lab 09/07/19 0437 09/09/19 0359 09/11/19 0819 09/12/19 0548 09/13/19 0510  NA 137 138 139  137 133* 137  K  3.5 3.2* 3.7  3.6 3.2* 4.0  CL 101 102 100  100 98 100  CO2 25 28 26  25 23 28   GLUCOSE 141* 151* 157*  158* 159* 157*  BUN 25* 22 24*  23 30* 33*  CREATININE 1.18 1.10 0.97  1.00 1.01 1.16  CALCIUM 8.0* 8.0* 8.3*  8.1* 8.0* 8.1*  MG  --   --  1.7 1.7 2.0  PHOS  --   --  1.9* 2.5 2.5   GFR: Estimated Creatinine Clearance: 82.6 mL/min (by C-G formula based on SCr of 1.16 mg/dL). Liver Function Tests: Recent Labs  Lab 09/11/19 0819 09/12/19 0548 09/13/19 0510  AST 30 28 21   ALT 47* 49* 47*  ALKPHOS 67 65 58  BILITOT 0.6 0.7 0.6  PROT 6.5 6.5 5.6*  ALBUMIN 3.2* 3.3* 2.9*   No results for input(s): LIPASE, AMYLASE in the last 168 hours. No results for input(s): AMMONIA in the last 168 hours. Coagulation Profile: No results for input(s): INR, PROTIME in the last 168 hours. Cardiac Enzymes: No results for input(s): CKTOTAL, CKMB, CKMBINDEX, TROPONINI in the last 168 hours. BNP (last 3 results) No results for input(s): PROBNP in the last 8760 hours. HbA1C: No results for input(s): HGBA1C in the last 72 hours. CBG: No results for input(s): GLUCAP in the last 168 hours. Lipid Profile: No results for input(s): CHOL, HDL, LDLCALC, TRIG, CHOLHDL, LDLDIRECT in the last 72 hours. Thyroid Function Tests: No results for input(s): TSH, T4TOTAL, FREET4, T3FREE, THYROIDAB in the last 72 hours. Anemia Panel: No results for input(s): VITAMINB12, FOLATE, FERRITIN, TIBC, IRON, RETICCTPCT in the last 72 hours. Sepsis Labs: No results for input(s): PROCALCITON, LATICACIDVEN in the last 168 hours.  Recent Results (from the past 240 hour(s))  Culture, blood (routine x 2)     Status: None   Collection Time: 09/04/19  9:40 PM   Specimen: BLOOD  Result Value Ref Range Status   Specimen Description BLOOD RIGHT ANTECUBITAL  Final   Special Requests   Final    BOTTLES DRAWN AEROBIC AND ANAEROBIC Blood Culture adequate volume   Culture   Final    NO GROWTH 5 DAYS Performed at [facility], [address], [place], [place] [address]    Report Status 09/10/2019 FINAL  Final  SARS Coronavirus 2 by RT PCR (hospital order,  performed in [facility] hospital lab) Nasopharyngeal Nasopharyngeal Swab     Status: None   Collection Time: 09/04/19 10:04 PM   Specimen: Nasopharyngeal Swab  Result Value Ref Range Status   SARS Coronavirus 2 NEGATIVE NEGATIVE Final    Comment: (NOTE) SARS-CoV-2 target nucleic acids are NOT DETECTED.  The SARS-CoV-2 RNA is generally detectable in upper and lower respiratory specimens during the acute phase of infection. The lowest concentration of SARS-CoV-2 viral copies this assay can detect is 250 copies / mL. A negative result does not preclude SARS-CoV-2 infection and should not be used as the sole basis for treatment or other patient management decisions.  A negative result may occur with improper specimen collection / handling, submission of specimen other than nasopharyngeal swab, presence of viral mutation(s) within the areas targeted by this assay, and inadequate number of viral copies (<250 copies / mL). A negative result must be combined with clinical observations, patient history, and epidemiological information.  Fact Sheet for Patients:   StrictlyIdeas.no  Fact Sheet for Healthcare Providers: BankingDealers.co.za  This test is not yet approved or  cleared by the [place] FDA and has been authorized for detection and/or diagnosis of SARS-CoV-2 by FDA under an Emergency Use Authorization (EUA).  This EUA will remain in effect (meaning this test can be used) for the duration of the COVID-19 declaration under Section 564(b)(1) of the Act, 21 U.S.C. section 360bbb-3(b)(1), unless the authorization is terminated or revoked sooner.  Performed at [facility], [place] [address] [place], [place] [address]   Culture, blood (routine x 2)     Status: None   Collection Time:  09/04/19 10:10 PM   Specimen: BLOOD  Result Value Ref Range Status   Specimen Description BLOOD RIGHT ANTECUBITAL  Final   Special Requests   Final    BOTTLES DRAWN AEROBIC AND ANAEROBIC Blood Culture adequate volume   Culture   Final    NO GROWTH 5 DAYS Performed at [facility], [place] [address] [place], [place] [address]    Report Status 09/10/2019 FINAL  Final    RN Pressure Injury Documentation:     Estimated body mass index is 30.44 kg/m as calculated from the following:   Height as of this encounter: 6' (1.829 m).   Weight as of this encounter: 101.8 kg.  Malnutrition Type:      Malnutrition Characteristics:      Nutrition Interventions:    Radiology Studies: No results found.  Scheduled Meds: . dexamethasone (DECADRON) injection  8 mg Intravenous Q6H  . docusate sodium  100 mg Oral BID  . famotidine  20 mg Oral Daily  . feeding supplement (PRO-STAT SUGAR FREE 64)  30 mL Oral BID  . fluconazole  100 mg Oral Daily  . folic acid  1 mg Oral Daily  . gabapentin  100 mg Oral QHS  . hydrALAZINE  25 mg Oral Q8H  . levETIRAcetam  500 mg Oral BID  . nystatin  5 mL Oral QID  . pantoprazole  40 mg Oral BID AC  . sodium chloride flush  3 mL Intravenous Q12H  . sucralfate  1 g Oral TID WC & HS  . tolnaftate   Topical BID   Continuous Infusions:   LOS: 9 days   Kerney Elbe, DO [facility] PAGER is on AMION  If 7PM-7AM, please contact night-coverage [email or web address]

## 2019-09-13 NOTE — TOC Progression Note (Signed)
Transition of Care [facility]) - Progression Note    Patient Details  Name: Daniel Bryant MRN: [id number] Date of Birth: 1960-05-19  Transition of [facility]) CM/SW Contact  Justo Hengel, Marjie Skiff, RN Phone Number: 09/13/2019, 1:47 PM  Clinical Narrative:    This CM has attempted to call several of pt's friends, with his permission, to secure assistance for pt at home. Voice mails were left with friend and case manager Bonnita Nasuti from the tiny house community.This CM also spoke with Burman Nieves at the [facility] about home health for pt. He was already active with [facility] for [facility]. This CM also requested to add HHOT at dc. Will need HHRN/OT orders faxed to Pt PCP [place] [phone]. Burman Nieves was also going to request personal aide care for pt at home. She states that might take a week or so to go through. [facility] liaison contacted to alert of need for resumption of care at dc. TOC will continue to follow.   Expected Discharge Plan: Home/Self Care Barriers to Discharge: Continued Medical Work up, Other (comment) (memory impairment)  Expected Discharge Plan and Services Expected Discharge Plan: Home/Self Care In-house Referral: NA Discharge Planning Services: CM Consult Post Acute Care Choice: NA Living arrangements for the past 2 months: Single Family Home Risk manager)                 DME Arranged: N/A DME Agency: NA       HH Arranged: NA HH Agency: NA         Social Determinants of Health (SDOH) Interventions    Readmission Risk Interventions Readmission Risk Prevention Plan 09/06/2019 05/21/2019  Transportation Screening Complete Complete  PCP or Specialist Appt within 5-7 Days - Complete  PCP or Specialist Appt within 3-5 Days Complete -  Home Care Screening - Complete  Medication Review (RN CM) - Complete  HRI or Home Care Consult Complete -  Social Work Consult for [place] Planning/Counseling Complete -  Palliative Care Screening Not Applicable -   Medication Review Press photographer) Referral to Pharmacy -

## 2019-09-13 NOTE — Progress Notes (Signed)
[name] was recently admitted to the hospital and has brain mets.  Rad Onc is set up for treatment.  Dr. Julien Nordmann is aware he is in the hospital. I also update our CSW that he was in the hospital.

## 2019-09-14 LAB — CBC WITH DIFFERENTIAL/PLATELET
Abs Immature Granulocytes: 0.53 10*3/uL — ABNORMAL HIGH (ref 0.00–0.07)
Basophils Absolute: 0.1 10*3/uL (ref 0.0–0.1)
Basophils Relative: 0 %
Eosinophils Absolute: 0 10*3/uL (ref 0.0–0.5)
Eosinophils Relative: 0 %
HCT: 39.1 % (ref 39.0–52.0)
Hemoglobin: 14 g/dL (ref 13.0–17.0)
Immature Granulocytes: 3 %
Lymphocytes Relative: 3 %
Lymphs Abs: 0.5 10*3/uL — ABNORMAL LOW (ref 0.7–4.0)
MCH: 36.1 pg — ABNORMAL HIGH (ref 26.0–34.0)
MCHC: 35.8 g/dL (ref 30.0–36.0)
MCV: 100.8 fL — ABNORMAL HIGH (ref 80.0–100.0)
Monocytes Absolute: 0.9 10*3/uL (ref 0.1–1.0)
Monocytes Relative: 4 %
Neutro Abs: 18.9 10*3/uL — ABNORMAL HIGH (ref 1.7–7.7)
Neutrophils Relative %: 90 %
Platelets: 133 10*3/uL — ABNORMAL LOW (ref 150–400)
RBC: 3.88 MIL/uL — ABNORMAL LOW (ref 4.22–5.81)
RDW: 12.8 % (ref 11.5–15.5)
WBC: 20.9 10*3/uL — ABNORMAL HIGH (ref 4.0–10.5)
nRBC: 0 % (ref 0.0–0.2)

## 2019-09-14 LAB — COMPREHENSIVE METABOLIC PANEL
ALT: 59 U/L — ABNORMAL HIGH (ref 0–44)
AST: 30 U/L (ref 15–41)
Albumin: 2.9 g/dL — ABNORMAL LOW (ref 3.5–5.0)
Alkaline Phosphatase: 57 U/L (ref 38–126)
Anion gap: 9 (ref 5–15)
BUN: 33 mg/dL — ABNORMAL HIGH (ref 8–23)
CO2: 25 mmol/L (ref 22–32)
Calcium: 7.7 mg/dL — ABNORMAL LOW (ref 8.9–10.3)
Chloride: 98 mmol/L (ref 98–111)
Creatinine, Ser: 1.07 mg/dL (ref 0.61–1.24)
GFR calc Af Amer: >60 mL/min (ref >60)
GFR calc non Af Amer: >60 mL/min (ref >60)
Glucose, Bld: 183 mg/dL — ABNORMAL HIGH (ref 70–99)
Potassium: 3.1 mmol/L — ABNORMAL LOW (ref 3.5–5.1)
Sodium: 132 mmol/L — ABNORMAL LOW (ref 135–145)
Total Bilirubin: 0.6 mg/dL (ref 0.3–1.2)
Total Protein: 5.7 g/dL — ABNORMAL LOW (ref 6.5–8.1)

## 2019-09-14 LAB — PHOSPHORUS: Phosphorus: 2.4 mg/dL — ABNORMAL LOW (ref 2.5–4.6)

## 2019-09-14 LAB — MAGNESIUM: Magnesium: 1.8 mg/dL (ref 1.7–2.4)

## 2019-09-14 MED ORDER — POTASSIUM PHOSPHATES 15 MMOLE/5ML IV SOLN
10.0000 mmol | Freq: Once | INTRAVENOUS | Status: AC
  Administered 2019-09-14: 10 mmol via INTRAVENOUS
  Filled 2019-09-14: qty 3.33

## 2019-09-14 MED ORDER — POTASSIUM CHLORIDE CRYS ER 20 MEQ PO TBCR
40.0000 meq | EXTENDED_RELEASE_TABLET | Freq: Two times a day (BID) | ORAL | Status: AC
  Administered 2019-09-14 (×2): 40 meq via ORAL
  Filled 2019-09-14 (×2): qty 2

## 2019-09-14 NOTE — Progress Notes (Signed)
PROGRESS NOTE    Daniel Bryant  YJE:[id number] DOB: 10/03/1960 DOA: 09/04/2019 PCP: [facility]   Brief Narrative:  HPI per Daniel Bryant on 09/04/19 HPI: Daniel Bryant is a 59 y.o. male with Past medical history of stage IV non-small cell lung cancer/adenocarcinoma follows up with Daniel Bryant currently on carboplatin Alimta, last dose June 06, 2019, no-show for April and May appointments, drug-induced rash secondary to Alimta, HTN, GERD, neuropathy, depression, hyperlipidemia. Patient presents with complaints of chest pain.  Reportedly chest pain began on the night of 09/03/2019 associated with headache shortness of breath nausea and upset stomach.  He also had vomiting episode at home.  EMS was called due to chest pain.  Patient lives alone but reportedly has home health who comes home. On further evaluation patient tells me that he may have a fall, found himself on the floor 1 day, he also has tongue bite but he does not know when it occurred. He denies having any focal deficit right now.  He reports dizziness but no vertigo.  On exam he has blurry vision but denies any history of the same at home. No loss of control of bowel or bladder. Reportedly he is compliant with his Lovenox injections. No diarrhea no constipation reported.  No bleeding reported by the patient. Currently does not have any chest pain. Reports forgetfulness. Does not have any family member to help him assist with decision-making process.  Does not have any HCPOA.  Denies any alcohol abuse or drug abuse.  No smoking. Patient has been seeing bugs crawling all over the room.  He also saw bubbles moving on his arm during examination.  ED Course: Presents with complaints of chest pain.  Initial troponin EKG unremarkable.  Found to have AKI.  Chest x-ray unremarkable as well.  CT without contrast was performed which was negative for any significant acute abnormality. Patient had significant confusion as  well as hallucination.  Patient was seeing bugs crawling all over.  Therefore CT head was performed which showed metastatic lesions with midline shift.  Neurosurgery was consulted they will evaluate the patient in the morning.  Patient was referred for admission.  At his baseline ambulates without assistance independent for most of his ADL;  manages his medication on his own.  **Interim History Patient was transferred to [facility] on 09/09/2019 for whole brain radiation at the recommendation of neurosurgery.  His AKI is improved and head CT that was obtained showed metastatic disease along with midline shift.  He started brain radiation yesterday and is continued on dexamethasone.  His WBC is continuing to elevated but in the setting of steroid demargination.  He is going to check with some family members if he can come stay with them as he does not have a safe discharge disposition to be discharged home at this time he has community given that PT OT recommending 24-hour supervision and assistance and patient refuses to go to SNF. TOC is working on trying to find patient assistance to have a safe discharge disposition given that he will need multiple radiation treatments.  Have contacted the patient's friends and unfortunately patient's cousin cannot do this because he lives in [place].  Still awaiting to hear back from the friends.  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  -Patient transferred to [facility] on 6/28 to initiate XRT and this was done yesterday.  -My colleague Discussed with neuro oncology; patient will be managed by radiation/medical oncology and seen by neuro oncology as needed and likely while receiving radiation -Continue Dexamethasone IV 8 mg q6h, Keppra 500 mg po BID (seizure prophylaxis) -Radiation oncology recommendations: XRT and to undergo whole Brain Radiation again today -C/w Supportive Care and now Discontinued IVF -C/w Antiemetics with po/IV Ondansetron 4 mg q6hprn Nausea   Delirium Confusion, improving -Secondary to above. Appears to be improved but he does have intermittent forgetfulness.  Oral Candidiasis Toenail fungus -Patient is on both Nystatin and Magic Mouthwash along with Fluconazole -Continue Nystatin and Fluconazole and will discontinue Magic Mouthwash -Have added Tinactin topically for his toenails but he does not want this.  He wants Fungi-Nail  Chest Pain -Appears to have been secondary to GERD/esophagitis. Resolved. -Continue with PPI as below and also continue with Maalox/Mylanta 30 mL p.o. every 4 hours as needed indigestion  GERD Esophagitis -Continue Pantoprazole 40 mg po BID and Famotidine 20 mg po Daily; Also has Maalox and Sucralafate 1 gram po TIDwm and Bedtime   AKI on CKD stage IIIa, improved -Baseline creatinine of 1.1. Peak creatinine of 2.12 which has now resolved back to baseline -Patient's BUN/Cr is now 30/1.01 yesterday and slightly bumped to 33/1.07 today -Will stop IVF Hydration with LR at 100 mL/hr -Avoid nephrotoxic medications, contrast dyes, hypotension and renally adjust medications -Continue to monitor and trend renal function repeat CMP in a.m.  Chronic DVT -S/p thrombectomy and started on Lovenox as an outpatient three months ago.  -Not on anticoagulation secondary to brain lesion with  resultant midline shift per recommendations by neurosurgery.   Hypokalemia -Intermittent and now K+ is 3.1 -Replete with p.o. potassium chloride 40 mEq twice daily as well as IV K-Phos 10 mmol -Continue to Monitor and Replete as necessary -Repeat CMP in the AM   Hypophosphatemia -Patient's Phos Level was 2.4 -Replete with IV K Phos 10 mmol -Continue to Monitor and Replete as Necessary -Repeat Phos Level in the AM   Hyponatremia -Mild at 132 -Continue to Monitor and Trend -Repeat CMP in the AM   Essential Hypertension -Continue Hydralazine 25 mg q8h -Stopped LR at 100 mL/hr yesterday (had been there since 09/04/19)\ -BP this AM was 148/93  Sick Sinus Syndrome -S/p pacemaker.  -MRI incompatible.  Elevated ALT -Mild with an ALT of 59; Likely in the setting of Steroids  -Continue to Monitor carefully and repeat CMP in the AM   COPD  -Stable and currently not in exacerbation -If necessary will add PRN Nebs   Thrombocytopenia -Patient platelet count trended up to 163 and then trended back down to 124 and is now 133 -Continue to monitor for signs or symptoms of bleeding; currently no overt bleeding noted  Leukocytosis -No infectious evidence.  -Likely related to steroid demargination.  -Trended down as WBC went from 17.2 -> 10.9 -> 16.8 -> 16.9 -> 20.9 -Not repeated today but will need continual monitoring -  Continue to Monitor for S/Sx of Infection -Repeat CBC in the AM.  Hyperglycemia -Blood Sugars have been elevated on Daily BMP/CMP -CBG's ranging from 136-183 -Continue to Monitor and Trend Blood Sugars carefully and if necessary will need to be placed on Sensitive Novolog SSI AC  Obesity -Estimated body mass index is 30.44 kg/m as calculated from the following:   Height as of this encounter: 6' (1.829 m).   Weight as of this encounter: 101.8 kg. -Continued Weight Loss and Dietary Counseling given   DVT prophylaxis: SCDs Code Status: FULL CODE  Family  Communication: No family present at bedside  Disposition Plan: Pending further clearance from Specialists (Neuro-Onc, Radiation Onc, and Medical Onc) and once he has a safe discharge disposition as he wants to go home however it is unsafe for him to go home as he needs 24-hour supervision; he is currently refusing SNF and will need to have a safe discharge disposition implemented prior to [place] releasing him from the hospital; [facility] assisting with follow-up on a safe discharge disposition and will contact his manager to see if he can stay with them  Status is: Inpatient  Remains inpatient appropriate because:Ongoing diagnostic testing needed not appropriate for outpatient work up, IV treatments appropriate due to intensity of illness or inability to take PO and Inpatient level of care appropriate due to severity of illness   Dispo: The patient is from: Home              Anticipated d/c is to: TBD              Anticipated d/c date is: 1-2 days pending we have a safe discharge disposition for him               Patient currently is not medically stable to d/c.  Consultants:   Neurosurgery  Neuro Oncology   Medical Oncology  Radiation Oncology  Palliative Care Medicine   Procedures:  EEG (09/05/2019) IMPRESSION: This studyshowed evidence of potential epileptogenicity as well as cortical dysfunction in left posterior quadrant secondary to underlying structural abnormality. Additionally, there is evidence of mild to moderate diffuse encephalopathy, non specific to etiology.No seizures were seen throughout the recording.  Antimicrobials:  Anti-infectives (From admission, onward)   Start     Dose/Rate Route Frequency Ordered Stop   09/09/19 1230  fluconazole (DIFLUCAN) tablet 100 mg     Discontinue     100 mg Oral Daily 09/09/19 1220       Subjective: Seen and examined at bedside and was also frustrated and tearful.  Complained of some pain behind his eyes and his throat.  No chest pain,  lightheadedness or dizziness.  Still complains of some lower extremity swelling.  Wanting to go home.  No other concerns or complaints at this time.  Objective: Vitals:   09/13/19 2125 09/14/19 0533 09/14/19 0600 09/14/19 1407  BP: (!) 181/103 (!) 161/88  (!) 148/93  Pulse: 83 71  100  Resp: 20 20  18   Temp: 97.7 F (36.5 C) 98.1 F (36.7 C)  98.8 F (37.1 C)  TempSrc: Oral Oral  Oral  SpO2: 96% 97%  97%  Weight:   101.8 kg   Height:        Intake/Output Summary (Last 24 hours) at 09/14/2019 1606 Last data filed at 09/14/2019 1231 Gross per 24 hour  Intake 1200 ml  Output --  Net 1200 ml   Filed Weights   09/11/19 0609 09/12/19 0556 09/14/19 0600  Weight: 107.6  kg 101.8 kg 101.8 kg   Examination: Physical Exam:  Constitutional: WN/WD obese Caucasian male currently in no acute distress but does appear little bit tearful Eyes: Lids and conjunctivae normal, sclerae anicteric  ENMT: External Ears, Nose appear normal. Grossly normal hearing. Neck: Appears normal, supple, no cervical masses, normal ROM, no appreciable thyromegaly; no JVD Respiratory: Diminished to auscultation bilaterally, no wheezing, rales, rhonchi or crackles. Normal respiratory effort and patient is not tachypenic. No accessory muscle use.  Cardiovascular: RRR, no murmurs / rubs / gallops. S1 and S2 auscultated. 1+ LE edema Abdomen: Soft, non-tender, distended due to body habitus. Bowel sounds positive.  GU: Deferred. Musculoskeletal: No clubbing / cyanosis of digits/nails. No joint deformity upper and lower extremities.  Skin: No rashes, lesions, ulcers on the skin evaluation. No induration; Warm and dry.  Neurologic: CN 2-12 grossly intact with no focal deficits. Romberg sign and cerebellar reflexes not assessed.  Psychiatric: Normal judgment and insight. Alert and oriented x 3. Anxious and slightly tearful mood and appropriate affect.   Data Reviewed: I have personally reviewed following labs and imaging  studies  CBC: Recent Labs  Lab 09/09/19 0359 09/11/19 0819 09/12/19 0548 09/13/19 0510 09/14/19 0457  WBC 10.9* 16.8* 16.8* 16.9* 20.9*  NEUTROABS 9.5* 15.0* 15.0* 14.9* 18.9*  HGB 13.2 14.9 14.8 13.4 14.0  HCT 38.5* 42.1 41.6 37.9* 39.1  MCV 102.1* 100.7* 99.5 100.3* 100.8*  PLT 122* 163 142* 124* 492*   Basic Metabolic Panel: Recent Labs  Lab 09/09/19 0359 09/11/19 0819 09/12/19 0548 09/13/19 0510 09/14/19 0457  NA 138 139   137 133* 137 132*  K 3.2* 3.7   3.6 3.2* 4.0 3.1*  CL 102 100   100 98 100 98  CO2 28 26   25 23 28 25   GLUCOSE 151* 157*   158* 159* 157* 183*  BUN 22 24*   23 30* 33* 33*  CREATININE 1.10 0.97   1.00 1.01 1.16 1.07  CALCIUM 8.0* 8.3*   8.1* 8.0* 8.1* 7.7*  MG  --  1.7 1.7 2.0 1.8  PHOS  --  1.9* 2.5 2.5 2.4*   GFR: Estimated Creatinine Clearance: 89.5 mL/min (by C-G formula based on SCr of 1.07 mg/dL). Liver Function Tests: Recent Labs  Lab 09/11/19 0819 09/12/19 0548 09/13/19 0510 09/14/19 0457  AST 30 28 21 30   ALT 47* 49* 47* 59*  ALKPHOS 67 65 58 57  BILITOT 0.6 0.7 0.6 0.6  PROT 6.5 6.5 5.6* 5.7*  ALBUMIN 3.2* 3.3* 2.9* 2.9*   No results for input(s): LIPASE, AMYLASE in the last 168 hours. No results for input(s): AMMONIA in the last 168 hours. Coagulation Profile: No results for input(s): INR, PROTIME in the last 168 hours. Cardiac Enzymes: No results for input(s): CKTOTAL, CKMB, CKMBINDEX, TROPONINI in the last 168 hours. BNP (last 3 results) No results for input(s): PROBNP in the last 8760 hours. HbA1C: No results for input(s): HGBA1C in the last 72 hours. CBG: No results for input(s): GLUCAP in the last 168 hours. Lipid Profile: No results for input(s): CHOL, HDL, LDLCALC, TRIG, CHOLHDL, LDLDIRECT in the last 72 hours. Thyroid Function Tests: No results for input(s): TSH, T4TOTAL, FREET4, T3FREE, THYROIDAB in the last 72 hours. Anemia Panel: No results for input(s): VITAMINB12, FOLATE, FERRITIN, TIBC, IRON,  RETICCTPCT in the last 72 hours. Sepsis Labs: No results for input(s): PROCALCITON, LATICACIDVEN in the last 168 hours.  Recent Results (from the past 240 hour(s))  Culture, blood (routine x 2)  Status: None   Collection Time: 09/04/19  9:40 PM   Specimen: BLOOD  Result Value Ref Range Status   Specimen Description BLOOD RIGHT ANTECUBITAL  Final   Special Requests   Final    BOTTLES DRAWN AEROBIC AND ANAEROBIC Blood Culture adequate volume   Culture   Final    NO GROWTH 5 DAYS Performed at [facility], [address], [place], [place] [address]    Report Status 09/10/2019 FINAL  Final  SARS Coronavirus 2 by RT PCR (hospital order, performed in [facility] hospital lab) Nasopharyngeal Nasopharyngeal Swab     Status: None   Collection Time: 09/04/19 10:04 PM   Specimen: Nasopharyngeal Swab  Result Value Ref Range Status   SARS Coronavirus 2 NEGATIVE NEGATIVE Final    Comment: (NOTE) SARS-CoV-2 target nucleic acids are NOT DETECTED.  The SARS-CoV-2 RNA is generally detectable in upper and lower respiratory specimens during the acute phase of infection. The lowest concentration of SARS-CoV-2 viral copies this assay can detect is 250 copies / mL. A negative result does not preclude SARS-CoV-2 infection and should not be used as the sole basis for treatment or other patient management decisions.  A negative result may occur with improper specimen collection / handling, submission of specimen other than nasopharyngeal swab, presence of viral mutation(s) within the areas targeted by this assay, and inadequate number of viral copies (<250 copies / mL). A negative result must be combined with clinical observations, patient history, and epidemiological information.  Fact Sheet for Patients:   StrictlyIdeas.no  Fact Sheet for Healthcare Providers: BankingDealers.co.za  This test is not yet approved or  cleared by the [place]  FDA and has been authorized for detection and/or diagnosis of SARS-CoV-2 by FDA under an Emergency Use Authorization (EUA).  This EUA will remain in effect (meaning this test can be used) for the duration of the COVID-19 declaration under Section 564(b)(1) of the Act, 21 U.S.C. section 360bbb-3(b)(1), unless the authorization is terminated or revoked sooner.  Performed at [facility], [place] [address], [place], [place] [address]   Culture, blood (routine x 2)     Status: None   Collection Time: 09/04/19 10:10 PM   Specimen: BLOOD  Result Value Ref Range Status   Specimen Description BLOOD RIGHT ANTECUBITAL  Final   Special Requests   Final    BOTTLES DRAWN AEROBIC AND ANAEROBIC Blood Culture adequate volume   Culture   Final    NO GROWTH 5 DAYS Performed at [facility], [place] [address], [place], [place] [address]    Report Status 09/10/2019 FINAL  Final    RN Pressure Injury Documentation:     Estimated body mass index is 30.44 kg/m as calculated from the following:   Height as of this encounter: 6' (1.829 m).   Weight as of this encounter: 101.8 kg.  Malnutrition Type:      Malnutrition Characteristics:      Nutrition Interventions:    Radiology Studies: No results found.  Scheduled Meds:  dexamethasone (DECADRON) injection  8 mg Intravenous Q6H   docusate sodium  100 mg Oral BID   famotidine  20 mg Oral Daily   feeding supplement (PRO-STAT SUGAR FREE 64)  30 mL Oral BID   fluconazole  100 mg Oral Daily   folic acid  1 mg Oral Daily   gabapentin  100 mg Oral QHS   hydrALAZINE  25 mg Oral Q8H   levETIRAcetam  500 mg  Oral BID   nystatin  5 mL Oral QID   pantoprazole  40 mg Oral BID AC   potassium chloride  40 mEq Oral BID   sodium chloride flush  3 mL Intravenous Q12H   sucralfate  1 g Oral TID WC & HS   tolnaftate   Topical BID   Continuous Infusions:  potassium PHOSPHATE IVPB (in mmol) 10 mmol (09/14/19 1032)    LOS: 10  days   Kerney Elbe, DO [facility] PAGER is on AMION  If 7PM-7AM, please contact night-coverage [email or web address]

## 2019-09-14 NOTE — Plan of Care (Signed)

## 2019-09-15 DIAGNOSIS — F060 Psychotic disorder with hallucinations due to known physiological condition: Secondary | ICD-10-CM | POA: Diagnosis present

## 2019-09-15 DIAGNOSIS — R600 Localized edema: Secondary | ICD-10-CM

## 2019-09-15 LAB — CBC WITH DIFFERENTIAL/PLATELET
Abs Immature Granulocytes: 0.39 10*3/uL — ABNORMAL HIGH (ref 0.00–0.07)
Basophils Absolute: 0 10*3/uL (ref 0.0–0.1)
Basophils Relative: 0 %
Eosinophils Absolute: 0 10*3/uL (ref 0.0–0.5)
Eosinophils Relative: 0 %
HCT: 38.2 % — ABNORMAL LOW (ref 39.0–52.0)
Hemoglobin: 13.4 g/dL (ref 13.0–17.0)
Immature Granulocytes: 2 %
Lymphocytes Relative: 3 %
Lymphs Abs: 0.6 10*3/uL — ABNORMAL LOW (ref 0.7–4.0)
MCH: 35.3 pg — ABNORMAL HIGH (ref 26.0–34.0)
MCHC: 35.1 g/dL (ref 30.0–36.0)
MCV: 100.5 fL — ABNORMAL HIGH (ref 80.0–100.0)
Monocytes Absolute: 1.1 10*3/uL — ABNORMAL HIGH (ref 0.1–1.0)
Monocytes Relative: 5 %
Neutro Abs: 18.9 10*3/uL — ABNORMAL HIGH (ref 1.7–7.7)
Neutrophils Relative %: 90 %
Platelets: 110 10*3/uL — ABNORMAL LOW (ref 150–400)
RBC: 3.8 MIL/uL — ABNORMAL LOW (ref 4.22–5.81)
RDW: 13 % (ref 11.5–15.5)
WBC: 20.9 10*3/uL — ABNORMAL HIGH (ref 4.0–10.5)
nRBC: 0 % (ref 0.0–0.2)

## 2019-09-15 LAB — COMPREHENSIVE METABOLIC PANEL
ALT: 59 U/L — ABNORMAL HIGH (ref 0–44)
AST: 23 U/L (ref 15–41)
Albumin: 2.8 g/dL — ABNORMAL LOW (ref 3.5–5.0)
Alkaline Phosphatase: 53 U/L (ref 38–126)
Anion gap: 8 (ref 5–15)
BUN: 29 mg/dL — ABNORMAL HIGH (ref 8–23)
CO2: 27 mmol/L (ref 22–32)
Calcium: 7.7 mg/dL — ABNORMAL LOW (ref 8.9–10.3)
Chloride: 101 mmol/L (ref 98–111)
Creatinine, Ser: 0.99 mg/dL (ref 0.61–1.24)
GFR calc Af Amer: >60 mL/min (ref >60)
GFR calc non Af Amer: >60 mL/min (ref >60)
Glucose, Bld: 130 mg/dL — ABNORMAL HIGH (ref 70–99)
Potassium: 3.6 mmol/L (ref 3.5–5.1)
Sodium: 136 mmol/L (ref 135–145)
Total Bilirubin: 0.7 mg/dL (ref 0.3–1.2)
Total Protein: 5.5 g/dL — ABNORMAL LOW (ref 6.5–8.1)

## 2019-09-15 LAB — PHOSPHORUS: Phosphorus: 3.2 mg/dL (ref 2.5–4.6)

## 2019-09-15 LAB — MAGNESIUM: Magnesium: 1.6 mg/dL — ABNORMAL LOW (ref 1.7–2.4)

## 2019-09-15 MED ORDER — FUROSEMIDE 10 MG/ML IJ SOLN
60.0000 mg | Freq: Once | INTRAMUSCULAR | Status: AC
  Administered 2019-09-15: 60 mg via INTRAVENOUS
  Filled 2019-09-15: qty 6

## 2019-09-15 MED ORDER — MAGNESIUM SULFATE 2 GM/50ML IV SOLN
2.0000 g | Freq: Once | INTRAVENOUS | Status: AC
  Administered 2019-09-15: 2 g via INTRAVENOUS
  Filled 2019-09-15: qty 50

## 2019-09-15 NOTE — Progress Notes (Signed)
PROGRESS NOTE    Daniel Bryant  RJJ:[id number] DOB: 05/09/1960 DOA: 09/04/2019 PCP: [facility]   Brief Narrative:  HPI per Dr. Berle Mull on 09/04/19 HPI: Daniel Bryant is a 59 y.o. male with Past medical history of stage IV non-small cell lung cancer/adenocarcinoma follows up with Dr. Julien Nordmann currently on carboplatin Alimta, last dose June 06, 2019, no-show for April and May appointments, drug-induced rash secondary to Alimta, HTN, GERD, neuropathy, depression, hyperlipidemia. Patient presents with complaints of chest pain.  Reportedly chest pain began on the night of 09/03/2019 associated with headache shortness of breath nausea and upset stomach.  He also had vomiting episode at home.  EMS was called due to chest pain.  Patient lives alone but reportedly has home health who comes home. On further evaluation patient tells me that he may have a fall, found himself on the floor 1 day, he also has tongue bite but he does not know when it occurred. He denies having any focal deficit right now.  He reports dizziness but no vertigo.  On exam he has blurry vision but denies any history of the same at home. No loss of control of bowel or bladder. Reportedly he is compliant with his Lovenox injections. No diarrhea no constipation reported.  No bleeding reported by the patient. Currently does not have any chest pain. Reports forgetfulness. Does not have any family member to help him assist with decision-making process.  Does not have any HCPOA.  Denies any alcohol abuse or drug abuse.  No smoking. Patient has been seeing bugs crawling all over the room.  He also saw bubbles moving on his arm during examination.  ED Course: Presents with complaints of chest pain.  Initial troponin EKG unremarkable.  Found to have AKI.  Chest x-ray unremarkable as well.  CT without contrast was performed which was negative for any significant acute abnormality. Patient had significant confusion as  well as hallucination.  Patient was seeing bugs crawling all over.  Therefore CT head was performed which showed metastatic lesions with midline shift.  Neurosurgery was consulted they will evaluate the patient in the morning.  Patient was referred for admission.  At his baseline ambulates without assistance independent for most of his ADL;  manages his medication on his own.  **Interim History Patient was transferred to [facility] on 09/09/2019 for whole brain radiation at the recommendation of neurosurgery.  His AKI is improved and head CT that was obtained showed metastatic disease along with midline shift.  He started brain radiation yesterday and is continued on dexamethasone.  His WBC is continuing to elevated but in the setting of steroid demargination.  He is going to check with some family members if he can come stay with them as he does not have a safe discharge disposition to be discharged home at this time he has community given that PT OT recommending 24-hour supervision and assistance and patient refuses to go to SNF. TOC is working on trying to find patient assistance to have a safe discharge disposition given that he will need multiple radiation treatments.  Have contacted the patient's friends and unfortunately patient's cousin cannot do this because he lives in [place].  The TOC case manager discussed with one of the patient's friends Kennyth Lose and she can only provide intermittent assistance and not 24-hour supervision.  Other friends are not picking up the phone.  Is clearly an unsafe discharge disposition given that the patient has had forgetfulness and has  had times that he is going to the store and not knowing how to get back.  He will need close supervision and because he is refusing to go to SNF will consult psychiatry to assess his mental capacity to refuse SNF.  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  -Patient transferred to [facility] on 6/28 to initiate XRT and this was done yesterday.  -My colleague Discussed with neuro oncology; patient will be managed by radiation/medical oncology and seen by neuro oncology as needed and likely while receiving radiation -Continue Dexamethasone IV 8 mg q6h, Keppra 500 mg po BID (seizure prophylaxis) -Radiation oncology recommendations: XRT and to undergo whole Brain Radiation and this is done during the weekdays -C/w Supportive Care and now Discontinued IVF -C/w Antiemetics with po/IV Ondansetron 4 mg q6hprn Nausea   Delirium Confusion, improving -Secondary to above. Appears to be improved but he does have intermittent forgetfulness and friends state that he has had times where he is gone to the store and forgot how to get back home  Oral Candidiasis Toenail fungus -Patient is on both Nystatin and Magic Mouthwash along with Fluconazole -Continue Nystatin and Fluconazole and will discontinue Magic Mouthwash -Have added Tinactin topically for his toenails but he does not want this.  He wants Fungi-Nail we do not carry it in the hospital  Chest Pain -Appears to have been secondary to GERD/esophagitis. Resolved. -Continue with PPI as below and also continue with Maalox/Mylanta 30 mL p.o. every 4 hours as needed indigestion  GERD Esophagitis -Continue Pantoprazole 40 mg po BID and Famotidine 20 mg po Daily; Also  has Maalox and Sucralafate 1 gram po TIDwm and Bedtime   AKI on CKD stage IIIa, improved -Baseline creatinine of 1.1. Peak creatinine of 2.12 which has now resolved back to baseline -Patient's BUN/Cr is 29/2.99 and is elevated BUN is in the setting of steroids -IV fluid hydration with lactated Ringer's 100 mils per hour has now been stopped and will give him a dose of IV Lasix 60 mg given that his legs are somewhat swollen -Avoid nephrotoxic medications, contrast dyes, hypotension and renally adjust medications -Continue to monitor and trend renal function repeat CMP in a.m.  Chronic DVT -S/p thrombectomy and started on Lovenox as an outpatient three months ago.  -Not on anticoagulation secondary to brain lesion with resultant midline shift per recommendations by neurosurgery.   Hypokalemia -Intermittent and now K+ is 3.6 -Continue to Monitor and Replete as necessary -Repeat CMP in the AM   Hypophosphatemia -Patient's Phos Level was 3.2 today -Continue to Monitor and Replete as Necessary -Repeat Phos Level in the AM   Hypomagnesemia -Patient magnesium level is 1.6  -Replete with IV mag sulfate 2 g  -Continue to monitor and replete as necessary -Repeat magnesium level in the a.m.  Hyponatremia -Mild at 132 yesterday and today it is 136 -Continue  to Monitor and Trend -Repeat CMP in the AM   Essential Hypertension -Continue Hydralazine 25 mg q8h -Stopped LR at 100 mL/hr (had been there since 09/04/19) and we will give him a dose of IV Lasix today given his leg swelling and puffiness -BP this AM was 160/109  Sick Sinus Syndrome -S/p pacemaker.  -MRI incompatible.  Elevated ALT -Mild with an ALT of 59 again; Likely in the setting of Steroids  -Continue to Monitor carefully and repeat CMP in the AM   COPD  -Stable and currently not in exacerbation -If necessary will add PRN Nebs   Thrombocytopenia -Patient platelet count trended up to 163 and then trended down to  110 but has been on the lower side with only one value that is within normal limits -Continue to monitor for signs or symptoms of bleeding; currently no overt bleeding noted  Leukocytosis -No infectious evidence.  -Likely related to steroid demargination.  -Trended down as WBC went from 17.2 -> 10.9 -> 16.8 -> 16.9 -> 20.9 and today is again 20.9 in the setting of steroid demargination -Not repeated today but will need continual monitoring -Continue to Monitor for S/Sx of Infection -Repeat CBC in the AM.  Hyperglycemia -Blood Sugars have been elevated on Daily BMP/CMP -CBG's ranging from 130-183 -Continue to Monitor and Trend Blood Sugars carefully and if necessary will need to be placed on Sensitive Novolog SSI AC  Obesity -Estimated body mass index is 30.44 kg/m as calculated from the following:   Height as of this encounter: 6' (1.829 m).   Weight as of this encounter: 101.8 kg. -Continued Weight Loss and Dietary Counseling given   DVT prophylaxis: SCDs Code Status: FULL CODE  Family Communication: No family present at bedside  Disposition Plan: Pending further clearance from Specialists (Neuro-Onc, Radiation Onc, and Medical Onc) and once he has a safe discharge disposition as he wants to go home however it is unsafe for him to go home as he needs 24-hour supervision and he is intermittently forgetful and confused at times; he is currently refusing SNF and will need to have a safe discharge disposition implemented prior to [place] releasing him from the hospital; [facility] assisting with follow-up on a safe discharge disposition and will contact his parents to see if they can stay with them however they can only provide intermittent supervision.  Will obtain a psychiatric evaluation for mental capacity to see if he can refuse SNF if he cannot he will likely go to SNF then as this is the best option for him at do not feel that he would do well at home  Status is: Inpatient  Remains inpatient  appropriate because:Ongoing diagnostic testing needed not appropriate for outpatient work up, IV treatments appropriate due to intensity of illness or inability to take PO and Inpatient level of care appropriate due to severity of illness   Dispo: The patient is from: Home              Anticipated d/c is to: TBD; documentation is for him to go to SNF but he is refusing              Anticipated d/c date is: 1-2 days pending we have a safe discharge disposition for him               Patient currently is not medically stable to d/c.  Consultants:   Neurosurgery  Neuro Oncology   Medical Oncology  Radiation Oncology  Palliative Care Medicine   Psychiatry  Procedures:  EEG (09/05/2019) IMPRESSION: This studyshowed evidence of potential epileptogenicity as well as cortical dysfunction in left posterior quadrant secondary to underlying structural abnormality. Additionally, there is evidence of mild to moderate diffuse encephalopathy, non specific to etiology.No seizures were seen throughout the recording.  Antimicrobials:  Anti-infectives (From admission, onward)   Start     Dose/Rate Route Frequency Ordered Stop   09/09/19 1230  fluconazole (DIFLUCAN) tablet 100 mg     Discontinue     100 mg Oral Daily 09/09/19 1220       Subjective: Seen and examined at bedside and he is sitting drinking his coffee and complaining really not his regular.  No chest pain, lightheadedness or dizziness.  Continues to have some lower extremity swelling.  No nausea or vomiting.  No other concerns or complaints this time and TOC restart a few of his friends and 1 answered and states that he is extremely forgetful at times and has had times where he has gone to the store not knowing how to get back home.  Objective: Vitals:   09/14/19 0600 09/14/19 1407 09/14/19 2101 09/15/19 0602  BP:  (!) 148/93 (!) 173/99 (!) 160/109  Pulse:  100 78 73  Resp:  18 16 16   Temp:  98.8 F (37.1 C) 98.4 F (36.9 C)  98.4 F (36.9 C)  TempSrc:  Oral Oral Oral  SpO2:  97% 95% 97%  Weight: 101.8 kg     Height:        Intake/Output Summary (Last 24 hours) at 09/15/2019 1221 Last data filed at 09/15/2019 0900 Gross per 24 hour  Intake 1080 ml  Output --  Net 1080 ml   Filed Weights   09/11/19 0609 09/12/19 0556 09/14/19 0600  Weight: 107.6 kg 101.8 kg 101.8 kg   Examination: Physical Exam:  Constitutional: WN/WD obese Caucasian male currently in no acute distress appears calm sitting drinking his coffee Eyes: Lids and conjunctivae normal, sclerae anicteric  ENMT: External Ears, Nose appear normal. Grossly normal hearing. Neck: Appears normal, supple, no cervical masses, normal ROM, no appreciable thyromegaly; no JVD Respiratory: Diminished to auscultation bilaterally, no wheezing, rales, rhonchi or crackles. Normal respiratory effort and patient is not tachypenic. No accessory muscle use.  Unlabored breathing Cardiovascular: RRR, no murmurs / rubs / gallops. S1 and S2 auscultated.  1+ lower extremity edema puffiness Abdomen: Soft, non-tender, distended secondary to body habitus. Bowel sounds positive.  GU: Deferred. Musculoskeletal: No clubbing / cyanosis of digits/nails. No joint deformity upper and lower extremities.  Skin: No rashes, lesions, ulcers on limited skin evaluation. No induration; Warm and dry.  Neurologic: CN 2-12 grossly intact with no focal deficits. Romberg sign and cerebellar reflexes not assessed.  Psychiatric: Normal judgment and insight. Alert and oriented x 3.  A little forgetful but has a normal mood and appropriate affect.    Data Reviewed: I have personally reviewed following labs and imaging studies  CBC: Recent Labs  Lab 09/11/19 0819 09/12/19 0548 09/13/19 0510 09/14/19 0457 09/15/19 0559  WBC 16.8* 16.8* 16.9* 20.9* 20.9*  NEUTROABS 15.0* 15.0* 14.9* 18.9* 18.9*  HGB 14.9 14.8 13.4 14.0 13.4  HCT 42.1 41.6 37.9* 39.1 38.2*  MCV 100.7* 99.5 100.3* 100.8*  100.5*  PLT 163 142* 124* 133* 240*   Basic Metabolic Panel: Recent Labs  Lab 09/11/19 0819 09/12/19 0548 09/13/19 0510 09/14/19 0457 09/15/19 0559  NA 139  137 133* 137 132* 136  K 3.7  3.6 3.2* 4.0 3.1* 3.6  CL 100  100 98 100 98 101  CO2 26  25 23 28 25 27   GLUCOSE 157*  158* 159* 157* 183* 130*  BUN 24*  23 30* 33* 33* 29*  CREATININE 0.97  1.00 1.01 1.16 1.07 0.99  CALCIUM 8.3*  8.1* 8.0* 8.1* 7.7* 7.7*  MG 1.7 1.7 2.0 1.8 1.6*  PHOS 1.9* 2.5 2.5 2.4* 3.2   GFR: Estimated Creatinine Clearance: 96.8 mL/min (by C-G formula based on SCr of 0.99 mg/dL). Liver Function Tests: Recent Labs  Lab 09/11/19 0819 09/12/19 0548 09/13/19 0510 09/14/19 0457 09/15/19 0559  AST 30 28 21 30 23   ALT 47* 49* 47* 59* 59*  ALKPHOS 67 65 58 57 53  BILITOT 0.6 0.7 0.6 0.6 0.7  PROT 6.5 6.5 5.6* 5.7* 5.5*  ALBUMIN 3.2* 3.3* 2.9* 2.9* 2.8*   No results for input(s): LIPASE, AMYLASE in the last 168 hours. No results for input(s): AMMONIA in the last 168 hours. Coagulation Profile: No results for input(s): INR, PROTIME in the last 168 hours. Cardiac Enzymes: No results for input(s): CKTOTAL, CKMB, CKMBINDEX, TROPONINI in the last 168 hours. BNP (last 3 results) No results for input(s): PROBNP in the last 8760 hours. HbA1C: No results for input(s): HGBA1C in the last 72 hours. CBG: No results for input(s): GLUCAP in the last 168 hours. Lipid Profile: No results for input(s): CHOL, HDL, LDLCALC, TRIG, CHOLHDL, LDLDIRECT in the last 72 hours. Thyroid Function Tests: No results for input(s): TSH, T4TOTAL, FREET4, T3FREE, THYROIDAB in the last 72 hours. Anemia Panel: No results for input(s): VITAMINB12, FOLATE, FERRITIN, TIBC, IRON, RETICCTPCT in the last 72 hours. Sepsis Labs: No results for input(s): PROCALCITON, LATICACIDVEN in the last 168 hours.  No results found for this or any previous visit (from the past 240 hour(s)).  RN Pressure Injury Documentation:       Estimated body mass index is 30.44 kg/m as calculated from the following:   Height as of this encounter: 6' (1.829 m).   Weight as of this encounter: 101.8 kg.  Malnutrition Type:      Malnutrition Characteristics:      Nutrition Interventions:    Radiology Studies: No results found.  Scheduled Meds: . dexamethasone (DECADRON) injection  8 mg Intravenous Q6H  . docusate sodium  100 mg Oral BID  . famotidine  20 mg Oral Daily  . feeding supplement (PRO-STAT SUGAR FREE 64)  30 mL Oral BID  . fluconazole  100 mg Oral Daily  . folic acid  1 mg Oral Daily  . gabapentin  100 mg Oral QHS  . hydrALAZINE  25 mg Oral Q8H  . levETIRAcetam  500 mg Oral BID  . nystatin  5 mL Oral QID  . pantoprazole  40 mg Oral BID AC  . sodium chloride flush  3 mL Intravenous Q12H  . sucralfate  1 g Oral TID WC & HS  . tolnaftate   Topical BID   Continuous Infusions:   LOS: 11 days   Kerney Elbe, DO [facility] PAGER is on AMION  If 7PM-7AM, please contact night-coverage [email or web address]

## 2019-09-15 NOTE — TOC Progression Note (Signed)
Transition of Care [facility]) - Progression Note    Patient Details  Name: Daniel Bryant MRN: [id number] Date of Birth: [birth year]  Transition of Care [facility]) CM/SW Contact  Joaquin Courts, RN Phone Number: 09/15/2019, 9:04 AM  Clinical Narrative:    CM spoke with patient's friend [name] (with patient permission) regarding assistance for patient at home.  Kennyth Lose reports she has been helping patient since he got diagnoses with cancer and while she is not there all the time, as [place] miles [place], she does check on patient periodically, helps with medications, and rides to his appointments.  Kennyth Lose reports there are a couple other members of the tiny community who also help patient but CM was unable to reach them by phone.  Kennyth Lose reports she can continue to check in on the patient at home and assist as much as she is able, however she is now employed and does work outside the home, further she reports she is in her 89's and it is difficult for her to go back and forth frequently.  Kennyth Lose reports prior to admit patient did well at home with being able to get around and eat/drink adequately, friends helped him with tasks such as medication administration, and getting to his appointments.  Kennyth Lose reports she can continue to provide intermittent assistance as her work schedule permits.     Expected Discharge Plan: Home/Self Care Barriers to Discharge: Continued Medical Work up, Other (comment) (memory impairment)  Expected Discharge Plan and Services Expected Discharge Plan: Home/Self Care In-house Referral: NA Discharge Planning Services: CM Consult Post Acute Care Choice: NA Living arrangements for the past 2 months: Single Family Home Risk manager)                 DME Arranged: N/A DME Agency: NA       HH Arranged: NA HH Agency: NA         Social Determinants of Health (SDOH) Interventions    Readmission Risk  Interventions Readmission Risk Prevention Plan 09/06/2019 05/21/2019  Transportation Screening Complete Complete  PCP or Specialist Appt within 5-7 Days - Complete  PCP or Specialist Appt within 3-5 Days Complete -  Home Care Screening - Complete  Medication Review (RN CM) - Complete  HRI or Home Care Consult Complete -  Social Work Consult for [place] Planning/Counseling Complete -  Palliative Care Screening Not Applicable -  Medication Review Press photographer) Referral to Pharmacy -

## 2019-09-15 NOTE — Consult Note (Signed)
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 male with history of stage IV non-small lung cancer who presented with confusion and acute mental status change, including hallucinations of seeing bugs crawling all over his room. Head CT revealed metastatic lesions with midline shift, neurosurgery consulted and brain radiation recommended. Patient is currently alert and oriented x 3, denies any current hallucinations and states that he would like to be discharged home. He states that he would not like to go to a skilled nursing facility as he feels that he can "do all that I need to do perfectly fine on my own". He denies any suicidal or homicidal ideation. 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 He denies having any close friends or family nearby, but states that he has support from his neighbors and states that he is able to call for help if he needs it and perform activities of daily living independently  without issue.   Patient does have capacity to make his own medical decisions based on the fact he is alert and oriented times four along with answering questions appropriately.  "What would you do if the hallucinations returned," which he stated, "call 911"  Multiple questions were asked with answers as a reasonable person would answer.  Some support in his community and he is able to drive to "[employer]" and get what he needs for groceries.  He was shaving prior to the assessment and ambulating in the room without issues.  No concerns at this time.  HPI per MD:  Daniel Bryant is a 59 y.o. male with Past medical history of stage IV non-small cell lung cancer/adenocarcinoma follows up with Dr. Julien Nordmann currently on carboplatin Alimta, last dose June 06, 2019, no-show for April and May appointments, drug-induced rash secondary to Alimta, HTN, GERD, neuropathy, depression, hyperlipidemia.  Patient presents with complaints of chest pain.  Reportedly chest pain began on the night of 09/03/2019 associated with headache shortness of breath nausea and upset stomach.  He also had vomiting episode at home.  EMS was called due to chest pain.  Patient lives alone but reportedly has home health who comes home. On further evaluation patient tells me that he may have a fall, found himself on the floor 1 day, he also has tongue bite but he does not know when it occurred. He denies having any focal deficit right now.  He reports dizziness but no vertigo.  On exam he has blurry vision but denies any history  of the same at home. Reports forgetfulness. Does not have any family member to help him assist with decision-making process.  Does not have any HCPOA.  Denies any alcohol abuse or drug abuse.  No smoking.  Patient has been seeing bugs crawling all over the room.  He also saw bubbles moving on his arm during examination.  Past Psychiatric History: PTSD  Risk to Self:  none Risk to Others:  none Prior Inpatient  Therapy:  none Prior Outpatient Therapy:  none  Past Medical History:  Past Medical History:  Diagnosis Date  . Asthma   . Cancer ([place])   . COPD (chronic obstructive pulmonary disease) ([place])   . GERD (gastroesophageal reflux disease)   . High cholesterol   . History of petit-mal seizures   . Hyperlipidemia   . Hypertension   . Memory impairment   . OSA (obstructive sleep apnea)    Does not tolerate CPAP  . Pacemaker   . PTSD (post-traumatic stress disorder)   . Sick sinus syndrome [facility])     Past Surgical History:  Procedure Laterality Date  . CARDIAC SURGERY    . CHEST TUBE INSERTION Right 05/16/2018   Procedure: INSERTION PLEURAL DRAINAGE CATHETER;  Surgeon: Ivin Poot, MD;  Location: [place];  Service: Thoracic;  Laterality: Right;  . CHEST TUBE INSERTION Right 05/16/2018   Procedure: Chest Tube Insertion;  Surgeon: Ivin Poot, MD;  Location: [place];  Service: Thoracic;  Laterality: Right;  . LOWER EXTREMITY VENOGRAPHY Left 05/20/2019   Procedure: LOWER EXTREMITY VENOGRAPHY/POSSIBLE THROMBECTOMY;  Surgeon: Waynetta Sandy, MD;  Location: [place] CV [facility];  Service: Cardiovascular;  Laterality: Left;  . PACEMAKER INSERTION    . PERIPHERAL VASCULAR BALLOON ANGIOPLASTY Left 05/20/2019   Procedure: PERIPHERAL VASCULAR BALLOON ANGIOPLASTY;  Surgeon: Waynetta Sandy, MD;  Location: [place] [facility];  Service: Cardiovascular;  Laterality: Left;  Femoral, common femoral, exterrnal iliac, and common illiac  . REMOVAL OF PLEURAL DRAINAGE CATHETER Right 08/30/2018   Procedure: REMOVAL OF PLEURAL DRAINAGE CATHETER;  Surgeon: Ivin Poot, MD;  Location: [facility] OR;  Service: Thoracic;  Laterality: Right;   Family History:  Family History  Problem Relation Age of Onset  . Alzheimer's disease Mother   . Cancer Mother   . Memory loss Father    Family Psychiatric  History: see above Social History:  Social History   Substance and Sexual Activity  Alcohol Use  Not Currently     Social History   Substance and Sexual Activity  Drug Use No    Social History   Socioeconomic History  . Marital status: Single    Spouse name: Not on file  . Number of children: Not on file  . Years of education: Not on file  . Highest education level: Not on file  Occupational History  . Not on file  Tobacco Use  . Smoking status: Former Smoker    Types: E-cigarettes  . Smokeless tobacco: Current User    Types: Chew  Vaping Use  . Vaping Use: Never used  Substance and Sexual Activity  . Alcohol use: Not Currently  . Drug use: No  . Sexual activity: Not on file  Other Topics Concern  . Not on file  Social History Narrative  . Not on file   Social Determinants of Health   Financial Resource Strain:   . Difficulty of Paying Living Expenses:   Food Insecurity:   . Worried About Charity fundraiser in the  Last Year:   . [place] in the Last Year:   Transportation Needs:   . Film/video editor (Medical):   Marland Kitchen Lack of Transportation (Non-Medical):   Physical Activity:   . Days of Exercise per Week:   . Minutes of Exercise per Session:   Stress:   . Feeling of Stress :   Social Connections:   . Frequency of Communication with Friends and Family:   . Frequency of Social Gatherings with Friends and Family:   . Attends Religious Services:   . Active Member of Clubs or Organizations:   . Attends Archivist Meetings:   Marland Kitchen Marital Status:    Additional Social History:    Allergies:   Allergies  Allergen Reactions  . Other Other (See Comments)    Patient states "I had a scrotal procedure a couple of years ago, there was something they gave me to relax me and I started freaking out and seeing things".  Unable to ascertain from pt or his records what medication was.    Labs:  Results for orders placed or performed during the hospital encounter of 09/04/19 (from the past 48 hour(s))  CBC with Differential/Platelet     Status:  Abnormal   Collection Time: 09/14/19  4:57 AM  Result Value Ref Range   WBC 20.9 (H) 4.0 - 10.5 K/uL   RBC 3.88 (L) 4.22 - 5.81 MIL/uL   Hemoglobin 14.0 13.0 - 17.0 g/dL   HCT 39.1 39 - 52 %   MCV 100.8 (H) 80.0 - 100.0 fL   MCH 36.1 (H) 26.0 - 34.0 pg   MCHC 35.8 30.0 - 36.0 g/dL   RDW 12.8 11.5 - 15.5 %   Platelets 133 (L) 150 - 400 K/uL   nRBC 0.0 0.0 - 0.2 %   Neutrophils Relative % 90 %   Neutro Abs 18.9 (H) 1.7 - 7.7 K/uL   Lymphocytes Relative 3 %   Lymphs Abs 0.5 (L) 0.7 - 4.0 K/uL   Monocytes Relative 4 %   Monocytes Absolute 0.9 0 - 1 K/uL   Eosinophils Relative 0 %   Eosinophils Absolute 0.0 0 - 0 K/uL   Basophils Relative 0 %   Basophils Absolute 0.1 0 - 0 K/uL   Immature Granulocytes 3 %   Abs Immature Granulocytes 0.53 (H) 0.00 - 0.07 K/uL    Comment: Performed at [facility], [place] [address], [place], [place] [address]  Comprehensive metabolic panel     Status: Abnormal   Collection Time: 09/14/19  4:57 AM  Result Value Ref Range   Sodium 132 (L) 135 - 145 mmol/L   Potassium 3.1 (L) 3.5 - 5.1 mmol/L    Comment: DELTA CHECK NOTED   Chloride 98 98 - 111 mmol/L   CO2 25 22 - 32 mmol/L   Glucose, Bld 183 (H) 70 - 99 mg/dL    Comment: Glucose reference range applies only to samples taken after fasting for at least 8 hours.   BUN 33 (H) 8 - 23 mg/dL   Creatinine, Ser 1.07 0.61 - 1.24 mg/dL   Calcium 7.7 (L) 8.9 - 10.3 mg/dL   Total Protein 5.7 (L) 6.5 - 8.1 g/dL   Albumin 2.9 (L) 3.5 - 5.0 g/dL   AST 30 15 - 41 U/L   ALT 59 (H) 0 - 44 U/L   Alkaline Phosphatase 57 38 - 126 U/L   Total Bilirubin 0.6 0.3 - 1.2 mg/dL   GFR  calc non Af Amer >60 >60 mL/min   GFR calc Af Amer >60 >60 mL/min   Anion gap 9 5 - 15    Comment: Performed at [facility], [place] [address] [place], [place] [address]  Phosphorus     Status: Abnormal   Collection Time: 09/14/19  4:57 AM  Result Value Ref Range   Phosphorus 2.4 (L) 2.5 - 4.6 mg/dL     Comment: Performed at [facility], [place] [address], [place], [place] [address]  Magnesium     Status: None   Collection Time: 09/14/19  4:57 AM  Result Value Ref Range   Magnesium 1.8 1.7 - 2.4 mg/dL    Comment: Performed at [facility], [place] [address], [place], [place] [address]  CBC with Differential/Platelet     Status: Abnormal   Collection Time: 09/15/19  5:59 AM  Result Value Ref Range   WBC 20.9 (H) 4.0 - 10.5 K/uL   RBC 3.80 (L) 4.22 - 5.81 MIL/uL   Hemoglobin 13.4 13.0 - 17.0 g/dL   HCT 38.2 (L) 39 - 52 %   MCV 100.5 (H) 80.0 - 100.0 fL   MCH 35.3 (H) 26.0 - 34.0 pg   MCHC 35.1 30.0 - 36.0 g/dL   RDW 13.0 11.5 - 15.5 %   Platelets 110 (L) 150 - 400 K/uL    Comment: Immature Platelet Fraction may be clinically indicated, consider ordering this additional test [id number]    nRBC 0.0 0.0 - 0.2 %   Neutrophils Relative % 90 %   Neutro Abs 18.9 (H) 1.7 - 7.7 K/uL   Lymphocytes Relative 3 %   Lymphs Abs 0.6 (L) 0.7 - 4.0 K/uL   Monocytes Relative 5 %   Monocytes Absolute 1.1 (H) 0 - 1 K/uL   Eosinophils Relative 0 %   Eosinophils Absolute 0.0 0 - 0 K/uL   Basophils Relative 0 %   Basophils Absolute 0.0 0 - 0 K/uL   Immature Granulocytes 2 %   Abs Immature Granulocytes 0.39 (H) 0.00 - 0.07 K/uL    Comment: Performed at [facility], [place] [address], [place], [place] [address]  Comprehensive metabolic panel     Status: Abnormal   Collection Time: 09/15/19  5:59 AM  Result Value Ref Range   Sodium 136 135 - 145 mmol/L   Potassium 3.6 3.5 - 5.1 mmol/L   Chloride 101 98 - 111 mmol/L   CO2 27 22 - 32 mmol/L   Glucose, Bld 130 (H) 70 - 99 mg/dL    Comment: Glucose reference range applies only to samples taken after fasting for at least 8 hours.   BUN 29 (H) 8 - 23 mg/dL   Creatinine, Ser 0.99 0.61 - 1.24 mg/dL   Calcium 7.7 (L) 8.9 - 10.3 mg/dL   Total Protein 5.5 (L) 6.5 - 8.1 g/dL   Albumin 2.8 (L) 3.5 - 5.0 g/dL    AST 23 15 - 41 U/L   ALT 59 (H) 0 - 44 U/L   Alkaline Phosphatase 53 38 - 126 U/L   Total Bilirubin 0.7 0.3 - 1.2 mg/dL   GFR calc non Af Amer >60 >60 mL/min   GFR calc Af Amer >60 >60 mL/min   Anion gap 8 5 - 15    Comment: Performed at [facility], [place] [address] [place], [place] [address]  Phosphorus     Status: None   Collection Time: 09/15/19  5:59  AM  Result Value Ref Range   Phosphorus 3.2 2.5 - 4.6 mg/dL    Comment: Performed at [facility], [place] [address], [place], [place] [address]  Magnesium     Status: Abnormal   Collection Time: 09/15/19  5:59 AM  Result Value Ref Range   Magnesium 1.6 (L) 1.7 - 2.4 mg/dL    Comment: Performed at [facility], [place] [address] [place], [place] [address]    Current Facility-Administered Medications  Medication Dose Route Frequency Provider Last Rate Last Admin  . alum & mag hydroxide-simeth (MAALOX/MYLANTA) 200-200-20 MG/5ML suspension 30 mL  30 mL Oral Q4H PRN Annita Brod, MD   30 mL at 09/08/19 2259  . dexamethasone (DECADRON) injection 8 mg  8 mg Intravenous Q6H Lavina Hamman, MD   8 mg at 09/15/19 1148  . docusate sodium (COLACE) capsule 100 mg  100 mg Oral BID Lavina Hamman, MD   100 mg at 09/13/19 2348  . famotidine (PEPCID) tablet 20 mg  20 mg Oral Daily Lavina Hamman, MD   20 mg at 09/15/19 0940  . feeding supplement (PRO-STAT SUGAR FREE 64) liquid 30 mL  30 mL Oral BID Lavina Hamman, MD   30 mL at 09/15/19 0940  . fluconazole (DIFLUCAN) tablet 100 mg  100 mg Oral Daily Guilford Shi, MD   100 mg at 09/15/19 0940  . folic acid (FOLVITE) tablet 1 mg  1 mg Oral Daily Lavina Hamman, MD   1 mg at 09/15/19 0940  . gabapentin (NEURONTIN) capsule 100 mg  100 mg Oral QHS Lavina Hamman, MD   100 mg at 09/14/19 2231  . hydrALAZINE (APRESOLINE) injection 10 mg  10 mg Intravenous Q6H PRN Sheikh, Georgina Quint Latif, DO      . hydrALAZINE (APRESOLINE) tablet 25 mg  25  mg Oral Q8H Swayze, Ava, DO   25 mg at 09/15/19 0634  . levETIRAcetam (KEPPRA) tablet 500 mg  500 mg Oral BID Eudelia Bunch, RPH   500 mg at 09/15/19 0940  . LORazepam (ATIVAN) tablet 1 mg  1 mg Oral Q6H PRN Raiford Noble Bee, DO   1 mg at 09/14/19 2231  . nystatin (MYCOSTATIN) 100000 UNIT/ML suspension 500,000 Units  5 mL Oral QID Lavina Hamman, MD   500,000 Units at 09/15/19 0940  . ondansetron (ZOFRAN) tablet 4 mg  4 mg Oral Q6H PRN Lavina Hamman, MD       Or  . ondansetron [facility]) injection 4 mg  4 mg Intravenous Q6H PRN Lavina Hamman, MD   4 mg at 09/06/19 0005  . pantoprazole (PROTONIX) EC tablet 40 mg  40 mg Oral BID AC Lavina Hamman, MD   40 mg at 09/15/19 0807  . phenol (CHLORASEPTIC) mouth spray 1 spray  1 spray Mouth/Throat PRN Rise Patience, MD   1 spray at 09/14/19 [phone]  . sodium chloride flush (NS) 0.9 % injection 3 mL  3 mL Intravenous Q12H Lavina Hamman, MD   3 mL at 09/14/19 2232  . sucralfate (CARAFATE) 1 GM/10ML suspension 1 g  1 g Oral TID WC & HS Lavina Hamman, MD   1 g at 09/15/19 1148  . tolnaftate (TINACTIN) 1 % cream   Topical BID Raiford Noble [place], [place]   1 application at [birth year] [id number]  . traMADol (ULTRAM) tablet 50 mg  50 mg Oral Q8H PRN Lavina Hamman, MD   50 mg  at 09/14/19 2044    Musculoskeletal: Strength & Muscle Tone: within normal limits Gait & Station: normal Patient leans: N/A  Psychiatric Specialty Exam: Physical Exam Vitals and nursing note reviewed.  Constitutional:      Appearance: He is well-developed.  HENT:     Head: Normocephalic.  Pulmonary:     Effort: Pulmonary effort is normal.  Musculoskeletal:        General: Normal range of motion.     Cervical back: Normal range of motion.  Neurological:     General: No focal deficit present.     Mental Status: He is alert.  Psychiatric:        Attention and Perception: Attention and perception normal.        Mood and Affect: Mood is anxious.        Speech: Speech  normal.        Behavior: Behavior normal. Behavior is cooperative.        Thought Content: Thought content normal.        Cognition and Memory: Cognition and memory normal.        Judgment: Judgment normal.     Review of Systems  Psychiatric/Behavioral: Negative for hallucinations, self-injury and suicidal ideas. The patient is nervous/anxious.   All other systems reviewed and are negative.   Blood pressure 139/90, pulse 93, temperature (!) 97.5 F (36.4 C), temperature source Oral, resp. rate 18, height 6' (1.829 m), weight 101.8 kg, SpO2 97 %.Body mass index is 30.44 kg/m.  General Appearance: Casual  Eye Contact:  Good  Speech:  Normal Rate  Volume:  Normal  Mood:  Anxious, mild  Affect:  Congruent  Thought Process:  Coherent and Descriptions of Associations: Intact  Orientation:  Full (Time, Place, and Person)  Thought Content:  WDL and Logical  Suicidal Thoughts:  No  Homicidal Thoughts:  No  Memory:  Immediate;   Good Recent;   Good Remote;   Good  Judgement:  Good  Insight:  Good  Psychomotor Activity:  Normal  Concentration:  Concentration: Good and Attention Span: Good  Recall:  Good  Fund of Knowledge:  Good  Language:  Good  Akathisia:  No  Handed:  Right  AIMS (if indicated):     Assets:  Housing Leisure Time Resilience  ADL's:  Intact  Cognition:  WNL  Sleep:      Treatment Plan Summary: Psychotic disorder related to another medical condition with hallucinations: -Symptoms resolved -He will call for help if these return  Capacity:  Client has capacity to make independent medical decisions.  Disposition: No evidence of imminent risk to self or others at present.    Waylan Boga, NP 09/15/2019 2:29 PM

## 2019-09-15 NOTE — Progress Notes (Signed)
Occupational Therapy Treatment Patient Details Name: Daniel Bryant MRN: [id number] DOB: 05-04-1960 Today's Date: 09/15/2019    History of present illness 59 yo male with onset of AMS and hallucinations of bugs in the house, missed recent appointments and general confusion was sent to ED.  Pt is complaining of tongue pain and states it was hurting and injured pre-hosp trip. Having HA's, confusion interfering with history.  Has esophageal mets, R parietal mets.  PMHx:  asthma, CA to lung, fatty liver, atherosclerosis, seizures, pacemaker, PTSD, SSS, OSA, HTN, COPD, chest tube, memory changes,    OT comments  [name] participated in medication management task reading medication labels and transcribing information into a medication list to work on timing of medication. Patient did well with medication that is given 1-2 times daily. He struggled with PRN medication - where to place on list and the "as needed" category and what information was needed on paper to take medication correctly. Needed significant verbal cues to perform task. Continue to recommend assistance with medication management at home and preferably 24/7 assistance. On a good note patient was able to state the cafeteria phone number correctly from memory.  Follow Up Recommendations       Equipment Recommendations       Recommendations for Other Services      Precautions / Restrictions Precautions Precautions: None       Mobility Bed Mobility                  Transfers                      Balance                                           ADL either performed or assessed with clinical judgement   ADL                                               Vision   Vision Assessment?: No apparent visual deficits   Perception     Praxis      Cognition Arousal/Alertness: Awake/alert Behavior During Therapy: WFL for tasks assessed/performed Overall  Cognitive Status: Impaired/Different from baseline Area of Impairment: Safety/judgement;Memory;Awareness                 Orientation Level: Situation Current Attention Level: Sustained     Safety/Judgement: Decreased awareness of safety;Decreased awareness of deficits Awareness: Emergent            Exercises     Shoulder Instructions       General Comments      Pertinent Vitals/ Pain       Pain Assessment: No/denies pain  Home Living                                          Prior Functioning/Environment              Frequency  Min 2X/week        Progress Toward Goals  OT Goals(current goals can now be found in the care plan section)  Progress towards OT goals: Progressing toward goals  Acute Rehab OT Goals Patient Stated Goal: to go home OT Goal Formulation: With patient Time For Goal Achievement: 09/20/19 Potential to Achieve Goals: Fair  Plan      Co-evaluation                 AM-PAC OT "6 Clicks" Daily Activity     Outcome Measure   Help from another person eating meals?: None Help from another person taking care of personal grooming?: None Help from another person toileting, which includes using toliet, bedpan, or urinal?: None Help from another person bathing (including washing, rinsing, drying)?: None Help from another person to put on and taking off regular upper body clothing?: None Help from another person to put on and taking off regular lower body clothing?: None 6 Click Score: 24    End of Session    OT Visit Diagnosis: Other symptoms and signs involving cognitive function   Activity Tolerance Patient tolerated treatment well   Patient Left in bed   Nurse Communication  (okay to see per RN)        Time: 0539-7673 OT Time Calculation (min): 22 min  Charges: OT General Charges $OT Visit: 1 Visit OT Treatments $Self Care/Home Management : 8-22 mins  Derl Barrow, OTR/L [place] Pager: [place] 09/15/2019, 4:47 PM

## 2019-09-16 DIAGNOSIS — F320 Major depressive disorder, single episode, mild: Secondary | ICD-10-CM

## 2019-09-16 LAB — CBC WITH DIFFERENTIAL/PLATELET
Abs Immature Granulocytes: 0.39 10*3/uL — ABNORMAL HIGH (ref 0.00–0.07)
Basophils Absolute: 0 10*3/uL (ref 0.0–0.1)
Basophils Relative: 0 %
Eosinophils Absolute: 0 10*3/uL (ref 0.0–0.5)
Eosinophils Relative: 0 %
HCT: 40.9 % (ref 39.0–52.0)
Hemoglobin: 14.4 g/dL (ref 13.0–17.0)
Immature Granulocytes: 2 %
Lymphocytes Relative: 2 %
Lymphs Abs: 0.4 10*3/uL — ABNORMAL LOW (ref 0.7–4.0)
MCH: 35 pg — ABNORMAL HIGH (ref 26.0–34.0)
MCHC: 35.2 g/dL (ref 30.0–36.0)
MCV: 99.5 fL (ref 80.0–100.0)
Monocytes Absolute: 0.8 10*3/uL (ref 0.1–1.0)
Monocytes Relative: 4 %
Neutro Abs: 19.3 10*3/uL — ABNORMAL HIGH (ref 1.7–7.7)
Neutrophils Relative %: 92 %
Platelets: 103 10*3/uL — ABNORMAL LOW (ref 150–400)
RBC: 4.11 MIL/uL — ABNORMAL LOW (ref 4.22–5.81)
RDW: 12.9 % (ref 11.5–15.5)
WBC: 20.9 10*3/uL — ABNORMAL HIGH (ref 4.0–10.5)
nRBC: 0 % (ref 0.0–0.2)

## 2019-09-16 LAB — COMPREHENSIVE METABOLIC PANEL
ALT: 58 U/L — ABNORMAL HIGH (ref 0–44)
AST: 26 U/L (ref 15–41)
Albumin: 2.9 g/dL — ABNORMAL LOW (ref 3.5–5.0)
Alkaline Phosphatase: 58 U/L (ref 38–126)
Anion gap: 8 (ref 5–15)
BUN: 33 mg/dL — ABNORMAL HIGH (ref 8–23)
CO2: 28 mmol/L (ref 22–32)
Calcium: 8.1 mg/dL — ABNORMAL LOW (ref 8.9–10.3)
Chloride: 98 mmol/L (ref 98–111)
Creatinine, Ser: 1 mg/dL (ref 0.61–1.24)
GFR calc Af Amer: >60 mL/min (ref >60)
GFR calc non Af Amer: >60 mL/min (ref >60)
Glucose, Bld: 158 mg/dL — ABNORMAL HIGH (ref 70–99)
Potassium: 3.8 mmol/L (ref 3.5–5.1)
Sodium: 134 mmol/L — ABNORMAL LOW (ref 135–145)
Total Bilirubin: 0.7 mg/dL (ref 0.3–1.2)
Total Protein: 5.9 g/dL — ABNORMAL LOW (ref 6.5–8.1)

## 2019-09-16 LAB — MAGNESIUM: Magnesium: 1.8 mg/dL (ref 1.7–2.4)

## 2019-09-16 LAB — PHOSPHORUS: Phosphorus: 3 mg/dL (ref 2.5–4.6)

## 2019-09-16 MED ORDER — MENTHOL 3 MG MT LOZG: 1.0000 | LOZENGE | OROMUCOSAL | 12 refills | Status: AC | PRN

## 2019-09-16 MED ORDER — MENTHOL 3 MG MT LOZG
1.0000 | LOZENGE | OROMUCOSAL | Status: DC | PRN
  Filled 2019-09-16: qty 9

## 2019-09-16 MED ORDER — LEVETIRACETAM 500 MG PO TABS: 500.0000 mg | ORAL_TABLET | Freq: Two times a day (BID) | ORAL | 0 refills | Status: AC

## 2019-09-16 MED ORDER — SUCRALFATE 1 GM/10ML PO SUSP: 1.0000 g | Freq: Three times a day (TID) | ORAL | 0 refills | Status: AC

## 2019-09-16 MED ORDER — HYDRALAZINE HCL 25 MG PO TABS: 25.0000 mg | ORAL_TABLET | Freq: Three times a day (TID) | ORAL | 0 refills | Status: AC

## 2019-09-16 MED ORDER — DEXAMETHASONE 4 MG PO TABS
4.0000 mg | ORAL_TABLET | Freq: Two times a day (BID) | ORAL | 0 refills | Status: AC
Start: 2019-09-16 — End: 2019-11-15

## 2019-09-16 MED ORDER — LISINOPRIL 5 MG PO TABS
5.0000 mg | ORAL_TABLET | Freq: Every day | ORAL | Status: DC
  Administered 2019-09-16: 5 mg via ORAL
  Filled 2019-09-16: qty 1

## 2019-09-16 MED ORDER — PHENOL 1.4 % MT LIQD
1.0000 | OROMUCOSAL | Status: DC | PRN
  Filled 2019-09-16: qty 177

## 2019-09-16 MED ORDER — TRAMADOL HCL 50 MG PO TABS: 50.0000 mg | ORAL_TABLET | Freq: Three times a day (TID) | ORAL | 0 refills | Status: AC | PRN

## 2019-09-16 MED ORDER — LORAZEPAM 1 MG PO TABS: 1.0000 mg | ORAL_TABLET | Freq: Four times a day (QID) | ORAL | 0 refills | Status: AC | PRN

## 2019-09-16 MED ORDER — DOCUSATE SODIUM 100 MG PO CAPS: 100.0000 mg | ORAL_CAPSULE | Freq: Two times a day (BID) | ORAL | 0 refills | Status: AC

## 2019-09-16 MED ORDER — PHENOL 1.4 % MT LIQD: 1.0000 | OROMUCOSAL | 0 refills | Status: AC | PRN

## 2019-09-16 MED ORDER — PANTOPRAZOLE SODIUM 40 MG PO TBEC: 40.0000 mg | DELAYED_RELEASE_TABLET | Freq: Two times a day (BID) | ORAL | 0 refills | Status: AC

## 2019-09-16 MED ORDER — FLUCONAZOLE 100 MG PO TABS: 100.0000 mg | ORAL_TABLET | Freq: Every day | ORAL | 0 refills | Status: AC

## 2019-09-16 MED ORDER — ALUM & MAG HYDROXIDE-SIMETH 200-200-20 MG/5ML PO SUSP: 30.0000 mL | ORAL | 0 refills | Status: AC | PRN

## 2019-09-16 NOTE — TOC Transition Note (Signed)
Transition of Care [facility]) - CM/SW Discharge Note   Patient Details  Name: Daniel Bryant MRN: [id number] Date of Birth: [birth year]  [facility] CM/SW Contact:  Lynnell Catalan, RN Phone Number: 09/16/2019, 2:05 PM   Clinical Narrative:    Per psych eval pt has capacity to make medical decisions. Pt to dc home today. This CM spoke with pt about transportation services and placed transport number on pt AVS. This CM also faxed home health orders to the [place] and notified [place] liaison that pt will dc home today.   Final next level of care: [place] Barriers to Discharge: Continued Medical Work up, Other (comment) (memory impairment)   Patient Goals and CMS Choice Patient states their goals for this hospitalization and ongoing recovery are:: Wants to go back to his apartment   Choice offered to / list presented to : NA   Discharge Plan and Services In-house Referral: NA Discharge Planning Services: CM Consult Post Acute Care Choice: NA          DME Arranged: N/A DME Agency: NA       HH Arranged: RN, OT, Nurse's Aide, Social Work [employer] Agency: [place] Date [place]: 09/13/19 Time [place]: 0200 Representative spoke with at [place]: [place] ([place]) Interventions     Readmission Risk Interventions Readmission Risk Prevention Plan 09/06/2019 05/21/2019  Transportation Screening Complete Complete  PCP or Specialist Appt within 5-7 Days - Complete  PCP or Specialist Appt within 3-5 Days Complete -  Home Care Screening - Complete  Medication Review (RN CM) - Complete  HRI or Home Care Consult Complete -  Social Work Consult for [place] Planning/Counseling Complete -  Palliative Care Screening Not Applicable -  Medication Review Press photographer) Referral to Pharmacy -

## 2019-09-16 NOTE — Discharge Summary (Signed)
Physician Discharge Summary  Daniel Bryant RCV:[id number] DOB: 1960/11/29 DOA: 09/04/2019  PCP: [facility]  Admit date: 09/04/2019 Discharge date: 09/16/2019  Admitted From: Home Disposition: Home with [place] OT/RN/SW (Refused SNF)  Recommendations for Outpatient Follow-up:  1. Follow up with PCP in 1-2 weeks 2. Follow up with Medical Oncology within 1-2 weeks 3. Follow up with Radiation Oncology for Daily XRT 4. Palliative care follow-up in the outpatient setting 5. Follow-up with Neurosurgery eventually if necessary 6. Please obtain CMP/CBC, Mag, Phos in one week 7. Please follow up on the following pending results:  Home Health: Yes  Equipment/Devices: None    Discharge Condition: Stable CODE STATUS: FULL CODE Diet recommendation: Dysphagia 3 Diet with Thin Liquids   Brief/Interim Summary: HPI per Dr. Berle Mull on 09/04/19 ZWC:HENIDPO Daniel Smithis a 59 y.o.malewith Past medical history ofstage IV non-small cell lung cancer/adenocarcinoma follows up with Dr. Julien Nordmann currently on carboplatin Alimta,last dose June 06, 2019, no-show for April and May appointments,drug-induced rash secondary to Alimta, HTN, GERD, neuropathy, depression, hyperlipidemia. Patient presents with complaints of chest pain. Reportedly chest pain began on the night of 09/03/2019 associated with headache shortness of breath nausea and upset stomach. He also had vomiting episode at home. EMS was called due to chest pain. Patient lives alone but reportedly has home health who comes home. On further evaluation patient tells me that he may have a fall, found himself on the floor 1 day, he also has tongue bite but he does not know when it occurred. He denies having any focal deficit right now. He reports dizziness but no vertigo. On exam he has blurry vision but denies any history of the same at home. No loss of control of bowel or bladder. Reportedly he is compliant with his Lovenox  injections. No diarrhea no constipation reported. No bleeding reported by the patient. Currently does not have any chest pain. Reports forgetfulness. Does not have any family member to help him assist with decision-making process. Does not have any HCPOA. Denies any alcohol abuse or drug abuse. No smoking. Patient has been seeing bugs crawling all over the room. He also saw bubbles moving on his arm during examination.  ED Course:Presents with complaints of chest pain. Initial troponin EKG unremarkable. Found to have AKI. Chest x-Daniel unremarkable as well. CT without contrast was performed which was negative for any significant acute abnormality. Patient had significant confusion as well as hallucination. Patient was seeing bugs crawling all over. Therefore CT head was performed which showed metastatic lesions with midline shift. Neurosurgery was consulted they will evaluate the patient in the morning. Patient was referred for admission.  Athisbaseline ambulates without assistance independent for most ofhisADL;  manageshismedication on hisown.  **Interim History Patient was transferred to [facility] on 09/09/2019 for whole brain radiation at the recommendation of neurosurgery.  His AKI is improved and head CT that was obtained showed metastatic disease along with midline shift.  He started brain radiation yesterday and is continued on dexamethasone.  His WBC is continuing to elevated but in the setting of steroid demargination.  He is going to check with some family members if he can come stay with them as he does not have a safe discharge disposition to be discharged home at this time he has community given that PT OT recommending 24-hour supervision and assistance and patient refuses to go to SNF. TOC is working on trying to find patient assistance to have a safe discharge disposition given that he  will need multiple radiation treatments.  Have contacted the  patient's friends and unfortunately patient's cousin cannot do this because he lives in [place].  The TOC case manager discussed with one of the patient's friends Daniel Bryant and she can only provide intermittent assistance and not 24-hour supervision.  Other friends are not picking up the phone.  Initially thought to have a an unsafe discharge disposition given that the patient has had forgetfulness and has had times that he is going to the store and not knowing how to get back.  He will need close supervision and because he is refusing to go to SNF will consult psychiatry to assess his mental capacity to refuse SNF.  Psychiatry evaluated him and deemed the patient to have mental capacity to make all his medical decisions.  Patient has a right to refuse SNF and wants to go home.  Currently he is medically stable to be discharged home and his friends will check in on him.  We will try to maximize his services with home health OT RN and social worker.  The [facility] team has arranged transfer for him to get daily radiation therapy.  I discussed with him that he is a high risk of readmission and he understands but he is stable to make his own medical decisions and clearly wants to go home.  He feels that his friends in the tiny home community will look after him and help him get him situated when I see his back.  He is medically stable from our standpoint to be discharged and I spoke to Dr. Benay Spice who recommends changing his dexamethasone from IV 8 mg every 6 hours to 4 mg p.o. twice daily and have the patient follow-up with Dr. Earlie Server in oncology as well as Dr. Lisbeth Renshaw in radiation therapy.  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  -Patient transferred to [facility] on 6/28 to initiate XRT and this was last done on Friday -My colleague Discussed with neuro oncology; patient will be managed by radiation/medical oncology and seen by neuro oncology as needed and likely while receiving radiation -Continue DexamethasoneIV 8 mg q6h while hospitalized and will change to 4 mg p.o. twice daily at discharge per Dr. Benay Spice recommendations, Keppra 500 mg po BID(seizure prophylaxis) -Radiation oncology recommendations: XRT and to undergo whole Brain Radiation and this is done during the weekdays -C/w Supportive Care and now Discontinued IVF -C/w Antiemetics with po/IV Ondansetron 4 mg q6hprn Nausea at discharge  Delirium Confusion, improving -Secondary to above. Appears to be improved but he does have intermittent forgetfulness and friends state that he has had times where he is gone to the store and forgot how to get back home -Psychiatry has deemed him to make his medical decision and states that he has a capacity to refuse SNF  and his own medical decisions.  He is currently not confused and is alert and oriented x3 but he does have some forget some things.  Currently I discussed with him about trying to go to SNF again and he is refusing.  He wants to go home and currently is stable to go home though I told him that I feel that he will not do well but he understands and wants to go home given that he has the capacity to refuse  Oral Candidiasis Toenail fungus -Patient is on both Nystatin and Magic Mouthwash along with  Fluconazole -Continue Nystatin and Fluconazole and will discontinue Magic Mouthwash -Have added Tinactin topically for his toenails but he does not want this.  He wants Fungi-Nail we do not carry it in the hospital -Complaining of some sore throat will continue with Cepacol lozenges as well as phenol spray -Follow-up in outpatient and continue fluconazole for 7 more days  Chest Pain -Appears to have been secondary to GERD/esophagitis. Resolved. -Continue with PPI as below and also continue with Maalox/Mylanta 30 mL p.o. every 4 hours as needed indigestion  GERD Esophagitis -Continue Pantoprazole 40 mg po BID and Famotidine 20 mg po Daily; Also has Maalox and Sucralafate 1 gram po TIDwm and Bedtime  -Continue at discharge  AKI on CKD stage IIIa, improved -Baseline creatinine of 1.1. Peak creatinine of 2.12 which has now resolved back to baseline -Patient's BUN/Cr is  now 33/1.00  and his elevated BUN is in the setting of steroids -IV fluid hydration with lactated Ringer's 100 mils per hour has now been stopped and will give him a dose of IV Lasix 60 mg given that his legs are somewhat swollen yesterday with improvement of the swelling -Avoid nephrotoxic medications, contrast dyes, hypotension and renally adjust medications -Continue to monitor and trend renal function repeat CMP in a.m.  Chronic DVT -S/p thrombectomy and started on Lovenox as an outpatient three months ago. -Not on anticoagulation secondary to brain lesion with resultant midline shift per recommendations by neurosurgery.   Hypokalemia -Intermittent and now K+ is 3.8 -Continue to Monitor and Replete as necessary -Repeat CMP in the AM   Hypophosphatemia -Patient's Phos Level was 3.0 today -Continue to Monitor and Replete as Necessary -Repeat Phos Level in the AM   Hypomagnesemia -Patient magnesium level is 1.8  -Replete with IV mag sulfate 2 g  -Continue to monitor and replete as necessary -Repeat  magnesium level in the a.m.  Hyponatremia -Mild at 134 -Continue to Monitor and Trend -Repeat CMP in the AM   Essential Hypertension -Continue Hydralazine 25 mg q8h -Stopped LR at 100 mL/hr (had been there since 09/04/19) and we will give him a dose of IV Lasix today given his leg swelling and puffiness -BP this AM was  157/98 prior to discharge  Sick Sinus Syndrome -S/p pacemaker. -MRI incompatible.  Elevated ALT -Mild with an ALT of 58; Likely in the setting of Steroids  -Continue to Monitor carefully and repeat CMP in the AM   COPD  -Stable and currently not in exacerbation -If necessary will add PRN Nebs   Thrombocytopenia -Patient platelet count trended up to 163 and then trended down to 103 today but has been on the lower side with only one value that is within normal limits; -Continue to monitor for signs or symptoms of bleeding; currently no overt bleeding noted -Repeat CBC in the outpatient setting  Leukocytosis -No infectious evidence.  -Likely related to steroid demargination.  -Trended down as  WBC went from 17.2 -> 10.9 -> 16.8 -> 16.9 -> 20.9 and today is again 20.9 in the setting of steroid demargination and is relatively stable -Not repeated today but will need continual monitoring -Continue to Monitor for S/Sx of Infection -Repeat CBC in the AM.  Hyperglycemia -Blood Sugars have been elevated on Daily BMP/CMP -CBG's ranging from 130-183 -Continue to Monitor and Trend Blood Sugars carefully and if necessary will need to be placed on Sensitive Novolog SSI AC  Obesity -Estimated body mass index is 30.62 kg/m as calculated from the following:   Height as of this encounter: 6' (1.829 m).   Weight as of this encounter: 102.4 kg. -Continued Weight Loss and Dietary Counseling given   Discharge Instructions  Discharge Instructions    Call MD for:  difficulty breathing, headache or visual disturbances   Complete by: As directed    Call MD for:   extreme fatigue   Complete by: As directed    Call MD for:  hives   Complete by: As directed    Call MD for:  persistant dizziness or light-headedness   Complete by: As directed    Call MD for:  persistant nausea and vomiting   Complete by: As directed    Call MD for:  redness, tenderness, or signs of infection (pain, swelling, redness, odor or green/yellow discharge around incision site)   Complete by: As directed    Call MD for:  severe uncontrolled pain   Complete by: As directed    Call MD for:  temperature >100.4   Complete by: As directed    Diet - low sodium heart healthy   Complete by: As directed    Discharge instructions   Complete by: As directed    You were cared for by a hospitalist during your hospital stay. If you have any questions about your discharge medications or the care you received while you were in the hospital after you are discharged, you can call the unit and ask to speak with the hospitalist on call if the hospitalist that took care of you is not available. Once you are discharged, your primary care physician will handle any further medical issues. Please note that NO REFILLS for any discharge medications will be authorized once you are discharged, as it is imperative that you return to your primary care physician (or establish a relationship with a primary care physician if you do not have one) for your aftercare needs so that they can reassess your need for medications and monitor your lab values.  Follow up with PCP and Medical Oncology, Radiation Oncology. Take all medications as prescribed. If symptoms change or worsen please return to the ED for evaluation   Increase activity slowly   Complete by: As directed      Allergies as of 09/16/2019      Reactions   Other Other (See Comments)   Patient states "I had a scrotal procedure a couple of years ago, there was something they gave me to relax me and I started freaking out and seeing things".  Unable to  ascertain from pt or his records what medication was.      Medication List    STOP taking these medications   enoxaparin 150 MG/ML injection Commonly known as: LOVENOX   Potassium Chloride ER 20 MEQ Tbcr     TAKE these medications   albuterol (2.5 MG/3ML) 0.083% nebulizer solution Commonly known as: PROVENTIL Take 3 mLs (2.5 mg total) by nebulization 2 (two)  times daily as needed for wheezing or shortness of breath.   alum & mag hydroxide-simeth 200-200-20 MG/5ML suspension Commonly known as: MAALOX/MYLANTA Take 30 mLs by mouth every 4 (four) hours as needed for indigestion.   busPIRone 10 MG tablet Commonly known as: BUSPAR Take 5 mg by mouth 2 (two) times daily.   dexamethasone 4 MG tablet Commonly known as: Decadron Take 1 tablet (4 mg total) by mouth 2 (two) times daily.   docusate sodium 100 MG capsule Commonly known as: COLACE Take 1 capsule (100 mg total) by mouth 2 (two) times daily.   escitalopram 20 MG tablet Commonly known as: LEXAPRO Take 20 mg by mouth daily.   famotidine 20 MG tablet Commonly known as: PEPCID Take 1 tablet (20 mg total) by mouth daily.   feeding supplement (PRO-STAT SUGAR FREE 64) Liqd Take 30 mLs by mouth 2 (two) times daily.   fluconazole 100 MG tablet Commonly known as: DIFLUCAN Take 1 tablet (100 mg total) by mouth daily. Start taking on: September 17, 238   folic acid 1 MG tablet Commonly known as: FOLVITE Take 1 tablet (1 mg total) by mouth daily.   gabapentin 100 MG capsule Commonly known as: NEURONTIN Take 100 mg by mouth at bedtime.   hydrALAZINE 25 MG tablet Commonly known as: APRESOLINE Take 1 tablet (25 mg total) by mouth every 8 (eight) hours.   levETIRAcetam 500 MG tablet Commonly known as: KEPPRA Take 1 tablet (500 mg total) by mouth 2 (two) times daily.   lisinopril 5 MG tablet Commonly known as: ZESTRIL Take 5 mg by mouth daily.   LORazepam 1 MG tablet Commonly known as: ATIVAN Take 1 tablet (1 mg total)  by mouth every 6 (six) hours as needed for anxiety.   magnesium oxide 400 (241.3 Mg) MG tablet Commonly known as: MAG-OX Take 1 tablet (400 mg total) by mouth in the morning, at noon, and at bedtime.   menthol-cetylpyridinium 3 MG lozenge Commonly known as: CEPACOL Take 1 lozenge (3 mg total) by mouth as needed for sore throat.   multivitamin with minerals Tabs tablet Take 1 tablet by mouth daily.   pantoprazole 40 MG tablet Commonly known as: PROTONIX Take 1 tablet (40 mg total) by mouth 2 (two) times daily before a meal.   phenol 1.4 % Liqd Commonly known as: CHLORASEPTIC Use as directed 1 spray in the mouth or throat as needed for throat irritation / pain.   prochlorperazine 10 MG tablet Commonly known as: COMPAZINE Take 1 tablet (10 mg total) by mouth every 6 (six) hours as needed for nausea or vomiting.   sucralfate 1 GM/10ML suspension Commonly known as: CARAFATE Take 10 mLs (1 g total) by mouth 4 (four) times daily -  with meals and at bedtime.   traMADol 50 MG tablet Commonly known as: ULTRAM Take 1 tablet (50 mg total) by mouth every 8 (eight) hours as needed for moderate pain or severe pain.       Follow-up Information    Transportation to radiation Follow up.   Why: call [phone]             Allergies  Allergen Reactions  . Other Other (See Comments)    Patient states "I had a scrotal procedure a couple of years ago, there was something they gave me to relax me and I started freaking out and seeing things".  Unable to ascertain from pt or his records what medication was.    Consultations:  Neurosurgery  Neuro Oncology   Medical Oncology  Radiation Oncology  Palliative Care Medicine   Psychiatry  Procedures/Studies: DG Chest 2 View  Result Date: 09/04/2019 CLINICAL DATA:  Chest pain since last night. Additional history obtained from prior radiology records: Stage IV lung cancer. EXAM: CHEST - 2 VIEW COMPARISON:  Chest CT 04/04/2019,  chest radiograph 11/05/2018 FINDINGS: Redemonstrated right chest dual lead pacer device. Heart size within normal limits. There is ill-defined and nodular opacities within the mid to lower right lung field, as well as right pleural thickening, consistent with metastatic disease within the right lung and right pleural space demonstrated on prior chest CT 04/03/2018. Superimposed infection at this site is difficult to exclude. The left lung is grossly clear. No left-sided pleural effusion. No evidence of pneumothorax. No acute bony abnormality identified. IMPRESSION: Ill-defined and nodular opacities within the mid to lower right lung field, as well as right pleural thickening, consistent with metastatic disease within the right lung and right pleural space demonstrated on prior chest CT 04/03/2018. Superimposed infection at this site is difficult to exclude radiographically. The left lung remains grossly clear. Electronically Signed   By: Kellie Simmering DO   On: 09/04/2019 13:21   CT Head Wo Contrast  Result Date: 09/04/2019 CLINICAL DATA:  Altered mental status. EXAM: CT HEAD WITHOUT CONTRAST TECHNIQUE: Contiguous axial images were obtained from the base of the skull through the vertex without intravenous contrast. COMPARISON:  May 01, 2018 FINDINGS: Brain: There is mild cerebral atrophy with widening of the extra-axial spaces and ventricular dilatation. There are areas of decreased attenuation within the white matter tracts of the supratentorial brain, consistent with microvascular disease changes. A 1.9 cm x 1.6 cm lobulated well-defined low-attenuation lesion, with thin surrounding hyperdense rim, is seen within the right parietal lobe. This represents a new finding when compared to the prior study. A moderate to marked amount of surrounding white matter low attenuation is seen within the right parietal and right temporal lobes, consistent with vasogenic edema. A mild amount of mass effect is seen on the  adjacent sulci. Approximately 2.37 mm right to left midline shift is seen. Small to moderate sized areas of white matter low attenuation are seen within the right frontal, right posterior parietal and left occipital lobes. These are new when compared to the prior study. Vascular: No hyperdense vessels are identified. Skull: Normal. Negative for fracture or focal lesion. Sinuses/Orbits: Multiple lobulated bilateral maxillary sinus polyps versus mucous retention cysts are seen. Other: None. IMPRESSION: 1. 1.9 cm x 1.6 cm lobulated well-defined low-attenuation lesion, with thin surrounding hyperdense rim, within the right parietal lobe, with a moderate to marked amount of surrounding vasogenic edema. This represents a new finding when compared to the prior study and is worrisome for the presence of an underlying neoplasm. MRI correlation is recommended. 2. Small to moderate sized areas of white matter low attenuation are seen within the right frontal, right posterior parietal and left occipital lobes. These are new when compared to the prior study and are concerning for additional areas of vasogenic edema versus white matter ischemia. MRI correlation is recommended. 3. Approximately 2.37 mm right to left midline shift. 4. Multiple lobulated bilateral maxillary sinus polyps versus mucous retention cysts. Electronically Signed   By: Virgina Norfolk M.D.   On: 09/04/2019 17:51   CT HEAD W & WO CONTRAST  Result Date: 09/05/2019 CLINICAL DATA:  Non-small-cell lung cancer. Abnormal CT. Unable to get MRI due to pacemaker EXAM: CT HEAD WITHOUT AND WITH  CONTRAST TECHNIQUE: Contiguous axial images were obtained from the base of the skull through the vertex without and with intravenous contrast CONTRAST:  55mL OMNIPAQUE IOHEXOL 350 MG/ML SOLN COMPARISON:  CT head without contrast 09/04/2019, CT head 05/01/2018 FINDINGS: Brain: 19 mm mass right insular cortex. The mass has a thin calcified wall with minimal enhancement. This  is unchanged from yesterday. There is a large amount of surrounding white matter vasogenic edema. Additional areas of vasogenic edema in the left occipital lobe, left parietal lobe, high right frontal lobe, and right parietal lobe unchanged from yesterday. These do not enhance but are most consistent with metastatic disease. Ventricle size is normal.  Negative for hemorrhage. Vascular: Negative for hyperdense vessel. Normal vascular enhancement. Skull: No focal skeletal lesions. Sinuses/Orbits: Mild mucosal edema paranasal sinuses. Negative orbit. Other: None IMPRESSION: 19 mm partially calcified mass in the right insular cortex with a large amount of surrounding vasogenic edema. This shows minimal enhancement however is consistent with metastatic disease. Additional areas of vasogenic edema bilaterally also consistent with metastatic disease although these do not show abnormal enhancement. Lack of enhancement may be due to treated tumor. Electronically Signed   By: Franchot Gallo M.D.   On: 09/05/2019 20:39   CT Chest Wo Contrast  Result Date: 09/04/2019 CLINICAL DATA:  59 year old male with chest pain and shortness of breath. Metastatic lung cancer. EXAM: CT CHEST WITHOUT CONTRAST TECHNIQUE: Multidetector CT imaging of the chest was performed following the standard protocol without IV contrast. COMPARISON:  Chest CT dated 04/04/2019. FINDINGS: Evaluation of this exam is limited in the absence of intravenous contrast. Cardiovascular: There is no cardiomegaly or pericardial effusion. Right pectoral dual lead pacemaker device. The thoracic aorta and central pulmonary arteries are grossly unremarkable. Coronary vascular calcifications of the LAD. Mediastinum/Nodes: Similar or minimally increased right hilar and mediastinal adenopathy. For example a right hilar lymph node measuring 19 mm previously measured 17 mm and a lymph node anterior to the trachea measuring 19 mm in short axis previously measured 15 mm. There  is circumferential thickening of the distal esophagus which may represent esophagitis or infiltration of the esophagus by malignancy. No mediastinal fluid collection. Lungs/Pleura: Diffusely thickened and lobular right pleural consistent with metastatic disease. Overall similar or slight increased in the pleural thickening since the prior CT. There is a 6.6 x 5.0 cm (previously 6.1 x 4.7 cm) ovoid mass at the right cardiophrenic angle. There is probable trace right pleural effusion. There is diffuse interstitial and interlobular septal prominence of the right lung base which may represent edema or lymphangitic carcinomatosis. Several nodular densities at the right lung base similar to prior CT consistent with metastatic disease. The left lung is clear. No pneumothorax. The central airways are patent. Upper Abdomen: Fatty liver. A 2 cm hypodense lesion in the right lobe of the liver similar to prior CT is not characterized but demonstrates fluid attenuation, likely a cyst. Partially visualized 6 mm nodular density in the left upper abdomen (162/5). Musculoskeletal: No acute osseous pathology. No obvious metastatic bone lesions. There is a 1 cm nodular density in the right lateral chest wall (139/5) similar to prior CT and most consistent with a metastatic disease or a mildly enlarged lymph node. IMPRESSION: 1. Similar or minimally progressed metastatic disease in the right lung, right pleural and hilar/mediastinal lymph node. 2. Circumferential thickening of the distal esophagus may represent esophagitis or infiltration of the esophagus by malignancy. 3. Fatty liver. 4. Partially visualized 6 mm nodular density in the left  upper abdomen. 5. Aortic Atherosclerosis (ICD10-I70.0). Electronically Signed   By: Anner Crete M.D.   On: 09/04/2019 18:03   EEG adult  Result Date: 09/05/2019 Lora Havens, MD     09/05/2019  2:11 PM Patient Name: Daniel Bryant MRN: [id number] Epilepsy Attending: Lora Havens  Referring Physician/Provider: Dr Berle Mull Date: 09/05/2019 Duration: 26.16 mins Patient history: 59 year old male presented to the ED last night after having hallucinations for a couple days. CT head revealed a 1.9x1.6cm lesion in the right parietal lobe with surrounding vasogenic edema.  EEG to evaluate for seizure Level of alertness: Lethargic AEDs during EEG study: LEV Technical aspects: This EEG study was done with scalp electrodes positioned according to the 10-20 International system of electrode placement. Electrical activity was acquired at a sampling rate of 500Hz  and reviewed with a high frequency filter of 70Hz  and a low frequency filter of 1Hz . EEG data were recorded continuously and digitally stored. Description: No clear posterior dominant rhythm was seen. Sharp waves were seen in left parieto-occipital region at times periodic at 0.25Hz . EEG showed continuous generalized 6-9Hz  theta-alpha activity as well as 3-5Hz  theta-delta slowing in left posterior quadrant.  Hyperventilation and photic stimulation were not performed.   ABNORMALITY - Sharp wave, left parieto- occipital region -Continuous slow, generalized and maximal left posterior quadrant IMPRESSION: This study showed evidence of potential epileptogenicity as well as cortical dysfunction in left posterior quadrant secondary to underlying structural abnormality. Additionally, there is evidence of mild to moderate diffuse encephalopathy, non specific to etiology. No seizures were seen throughout the recording. Lora Havens    EEG (09/05/2019) IMPRESSION: This studyshowed evidence of potential epileptogenicity as well as cortical dysfunction in left posterior quadrant secondary to underlying structural abnormality. Additionally, there is evidence of mild to moderate diffuse encephalopathy, non specific to etiology.No seizures were seen throughout the recording.  Subjective: Seen and examined at bedside and states that his throat was  hurting a little bit today.  No nausea or vomiting.  Psych evaluated him yesterday and deemed him to have capacity to make his own medical decisions.  He continues to refuse SNF at this time.  Despite my concerns for him not doing well at home he still wants to go home and live in his tiny home community.  I have asked the caseworker to have a arrange transfer to his radiation therapies and he will be assisted by his friend Daniel Bryant at home sometimes.  He also states that he has friends in the tiny home community that are willing to help him out.  We will also send him out with home health OT RN as well as social work.  Patient currently is medically stable to be discharged and will need to follow-up with PCP, medical oncology as well as radiation oncology in the outpatient setting and patient feels that he will have adequate help at home and feels that if he worsens he will come back to the hospital.  Discharge Exam: Vitals:   09/16/19 1257 09/16/19 1406  BP: (!) 166/101 (!) 157/98  Pulse: 73 82  Resp: 16   Temp: 98.2 F (36.8 C)   SpO2: 95%    Vitals:   09/15/19 2053 09/16/19 0500 09/16/19 1257 09/16/19 1406  BP: (!) 146/73 (!) 162/100 (!) 166/101 (!) 157/98  Pulse: 68 79 73 82  Resp: 20 20 16    Temp: 98 F (36.7 C) 98.1 F (36.7 C) 98.2 F (36.8 C)   TempSrc: Oral Oral  Oral   SpO2: 96% 96% 95%   Weight:  102.4 kg    Height:       General: Pt is alert, awake, not in acute distress Cardiovascular: RRR, S1/S2 +, no rubs, no gallops Respiratory: Diminished bilaterally, no wheezing, no rhonchi Abdominal: Soft, NT, distended secondary body habitus, bowel sounds + Extremities: 1+ lower extremity edema, no cyanosis  The results of significant diagnostics from this hospitalization (including imaging, microbiology, ancillary and laboratory) are listed below for reference.    Microbiology: No results found for this or any previous visit (from the past 240 hour(s)).   Labs: BNP (last 3  results) No results for input(s): BNP in the last 8760 hours. Basic Metabolic Panel: Recent Labs  Lab 09/12/19 0548 09/13/19 0510 09/14/19 0457 09/15/19 0559 09/16/19 0615  NA 133* 137 132* 136 134*  K 3.2* 4.0 3.1* 3.6 3.8  CL 98 100 98 101 98  CO2 23 28 25 27 28   GLUCOSE 159* 157* 183* 130* 158*  BUN 30* 33* 33* 29* 33*  CREATININE 1.01 1.16 1.07 0.99 1.00  CALCIUM 8.0* 8.1* 7.7* 7.7* 8.1*  MG 1.7 2.0 1.8 1.6* 1.8  PHOS 2.5 2.5 2.4* 3.2 3.0   Liver Function Tests: Recent Labs  Lab 09/12/19 0548 09/13/19 0510 09/14/19 0457 09/15/19 0559 09/16/19 0615  AST 28 21 30 23 26   ALT 49* 47* 59* 59* 58*  ALKPHOS 65 58 57 53 58  BILITOT 0.7 0.6 0.6 0.7 0.7  PROT 6.5 5.6* 5.7* 5.5* 5.9*  ALBUMIN 3.3* 2.9* 2.9* 2.8* 2.9*   No results for input(s): LIPASE, AMYLASE in the last 168 hours. No results for input(s): AMMONIA in the last 168 hours. CBC: Recent Labs  Lab 09/12/19 0548 09/13/19 0510 09/14/19 0457 09/15/19 0559 09/16/19 0615  WBC 16.8* 16.9* 20.9* 20.9* 20.9*  NEUTROABS 15.0* 14.9* 18.9* 18.9* 19.3*  HGB 14.8 13.4 14.0 13.4 14.4  HCT 41.6 37.9* 39.1 38.2* 40.9  MCV 99.5 100.3* 100.8* 100.5* 99.5  PLT 142* 124* 133* 110* 103*   Cardiac Enzymes: No results for input(s): CKTOTAL, CKMB, CKMBINDEX, TROPONINI in the last 168 hours. BNP: Invalid input(s): POCBNP CBG: No results for input(s): GLUCAP in the last 168 hours. D-Dimer No results for input(s): DDIMER in the last 72 hours. Hgb A1c No results for input(s): HGBA1C in the last 72 hours. Lipid Profile No results for input(s): CHOL, HDL, LDLCALC, TRIG, CHOLHDL, LDLDIRECT in the last 72 hours. Thyroid function studies No results for input(s): TSH, T4TOTAL, T3FREE, THYROIDAB in the last 72 hours.  Invalid input(s): FREET3 Anemia work up No results for input(s): VITAMINB12, FOLATE, FERRITIN, TIBC, IRON, RETICCTPCT in the last 72 hours. Urinalysis    Component Value Date/Time   COLORURINE YELLOW  09/04/2019 1956   APPEARANCEUR CLEAR 09/04/2019 1956   LABSPEC 1.015 09/04/2019 1956   PHURINE 7.0 09/04/2019 1956   GLUCOSEU NEGATIVE 09/04/2019 1956   HGBUR NEGATIVE 09/04/2019 [place] NEGATIVE 09/04/2019 [place] NEGATIVE 09/04/2019 1956   PROTEINUR 30 (A) 09/04/2019 1956   NITRITE NEGATIVE 09/04/2019 1956   LEUKOCYTESUR NEGATIVE 09/04/2019 1956   Sepsis Labs Invalid input(s): PROCALCITONIN,  WBC,  LACTICIDVEN Microbiology No results found for this or any previous visit (from the past 240 hour(s)).  Time coordinating discharge: 35 minutes  SIGNED:  Kerney Elbe, DO [facility] 09/16/2019, 5:58 PM Pager is on [place]  If 7PM-7AM, please contact night-coverage [email or web address]

## 2019-09-17 ENCOUNTER — Ambulatory Visit
Admission: RE | Admit: 2019-09-17 | Discharge: 2019-09-17 | Disposition: A | Discharge status: Home / Self Care | Payer: No Typology Code available for payment source | Source: Ambulatory Visit | Attending: Radiation Oncology | Admitting: Radiation Oncology

## 2019-09-17 ENCOUNTER — Telehealth: Payer: Self-pay | Admitting: Internal Medicine

## 2019-09-17 ENCOUNTER — Ambulatory Visit: Payer: No Typology Code available for payment source

## 2019-09-17 ENCOUNTER — Encounter: Payer: Self-pay | Admitting: *Deleted

## 2019-09-17 DIAGNOSIS — Z510 Encounter for antineoplastic radiation therapy: Secondary | ICD-10-CM | POA: Diagnosis present

## 2019-09-17 DIAGNOSIS — C3411 Malignant neoplasm of upper lobe, right bronchus or lung: Secondary | ICD-10-CM | POA: Diagnosis present

## 2019-09-17 DIAGNOSIS — C7931 Secondary malignant neoplasm of brain: Secondary | ICD-10-CM | POA: Diagnosis present

## 2019-09-17 NOTE — Progress Notes (Signed)
Mr. Loughry has been discharge from the hospital.  I updated Dr. Julien Nordmann.  He would like to see patient for a follow up week of 09/30/19.  Scheduling message completed.

## 2019-09-17 NOTE — Telephone Encounter (Signed)
Scheduled appt per 7/6 sch msg - no answer. And unable to leave message - vmail full. Mailed letter with paper date and time

## 2019-09-18 ENCOUNTER — Ambulatory Visit
Admission: RE | Admit: 2019-09-18 | Discharge: 2019-09-18 | Disposition: A | Discharge status: Home / Self Care | Payer: No Typology Code available for payment source | Source: Ambulatory Visit | Attending: Radiation Oncology | Admitting: Radiation Oncology

## 2019-09-18 ENCOUNTER — Other Ambulatory Visit: Payer: Self-pay

## 2019-09-18 DIAGNOSIS — Z510 Encounter for antineoplastic radiation therapy: Secondary | ICD-10-CM | POA: Diagnosis not present

## 2019-09-19 ENCOUNTER — Encounter: Payer: Self-pay | Admitting: General Practice

## 2019-09-19 ENCOUNTER — Telehealth: Payer: Self-pay | Admitting: General Practice

## 2019-09-19 ENCOUNTER — Other Ambulatory Visit: Payer: Self-pay

## 2019-09-19 ENCOUNTER — Ambulatory Visit
Admission: RE | Admit: 2019-09-19 | Discharge: 2019-09-19 | Disposition: A | Discharge status: Home / Self Care | Payer: No Typology Code available for payment source | Source: Ambulatory Visit | Attending: Radiation Oncology | Admitting: Radiation Oncology

## 2019-09-19 DIAGNOSIS — Z510 Encounter for antineoplastic radiation therapy: Secondary | ICD-10-CM | POA: Diagnosis not present

## 2019-09-19 NOTE — Telephone Encounter (Signed)
[place] CSW Progress Notes  Request from [place] per tiny house community members who assist patient, he is unstable at home and can benefit from cane or walker.  Asked his [place] PCP to address this need if possible. Left VM - PCP office states they will need to speak w patient directly and verified his phone number.  Also left VM for his PCP CSW Kellie Simmering - [phone]) and Glean Hess.  Also requested an update on plans for a [facility] in home aide for patient - this was mentioned in his discharge paperwork during recent hospitalization.  Awaiting return call.  Edwyna Shell, LCSW Clinical Social Worker Phone:  [phone] Cell:  [phone]

## 2019-09-19 NOTE — Progress Notes (Signed)
[facility] CSW Progress Notes  Email from [place], [place] CSW, assigned to this patient.  States that cane and walker orders have been placed by his [facility] PCP this morning, are in pending status, and should be delivered to him.  She is unsure when they would arrive at patients home.    Edwyna Shell, LCSW Clinical Social Worker Phone:  [phone]

## 2019-09-20 ENCOUNTER — Telehealth: Payer: Self-pay | Admitting: General Practice

## 2019-09-20 ENCOUNTER — Ambulatory Visit
Admission: RE | Admit: 2019-09-20 | Discharge: 2019-09-20 | Disposition: A | Discharge status: Home / Self Care | Payer: No Typology Code available for payment source | Source: Ambulatory Visit | Attending: Radiation Oncology | Admitting: Radiation Oncology

## 2019-09-20 ENCOUNTER — Other Ambulatory Visit: Payer: Self-pay

## 2019-09-20 DIAGNOSIS — Z510 Encounter for antineoplastic radiation therapy: Secondary | ICD-10-CM | POA: Diagnosis not present

## 2019-09-20 NOTE — Telephone Encounter (Signed)
[facility] CSW Progress Notes  Per [facility] CSW, patient was referred for homemaker home health aide through the [place].  Patient declined this service today.  Edwyna Shell, LCSW Clinical Social Worker Phone:  [phone] Cell:  [phone]

## 2019-09-23 ENCOUNTER — Ambulatory Visit
Admission: RE | Admit: 2019-09-23 | Discharge: 2019-09-23 | Disposition: A | Discharge status: Home / Self Care | Payer: No Typology Code available for payment source | Source: Ambulatory Visit | Attending: Radiation Oncology | Admitting: Radiation Oncology

## 2019-09-23 ENCOUNTER — Other Ambulatory Visit: Payer: Self-pay

## 2019-09-23 DIAGNOSIS — Z510 Encounter for antineoplastic radiation therapy: Secondary | ICD-10-CM | POA: Diagnosis not present

## 2019-09-24 ENCOUNTER — Telehealth: Payer: Self-pay | Admitting: Internal Medicine

## 2019-09-24 ENCOUNTER — Inpatient Hospital Stay (HOSPITAL_COMMUNITY): Admission: RE | Admit: 2019-09-24 | Payer: No Typology Code available for payment source | Source: Ambulatory Visit

## 2019-09-24 ENCOUNTER — Inpatient Hospital Stay: Payer: No Typology Code available for payment source | Attending: Internal Medicine

## 2019-09-24 ENCOUNTER — Encounter: Payer: Self-pay | Admitting: General Practice

## 2019-09-24 ENCOUNTER — Ambulatory Visit
Admission: RE | Admit: 2019-09-24 | Discharge: 2019-09-24 | Disposition: A | Discharge status: Home / Self Care | Payer: No Typology Code available for payment source | Source: Ambulatory Visit | Attending: Radiation Oncology | Admitting: Radiation Oncology

## 2019-09-24 ENCOUNTER — Other Ambulatory Visit: Payer: Self-pay

## 2019-09-24 ENCOUNTER — Other Ambulatory Visit (HOSPITAL_BASED_OUTPATIENT_CLINIC_OR_DEPARTMENT_OTHER): Payer: No Typology Code available for payment source | Admitting: Internal Medicine

## 2019-09-24 ENCOUNTER — Encounter: Payer: No Typology Code available for payment source | Admitting: Vascular Surgery

## 2019-09-24 DIAGNOSIS — C3491 Malignant neoplasm of unspecified part of right bronchus or lung: Secondary | ICD-10-CM

## 2019-09-24 DIAGNOSIS — Z510 Encounter for antineoplastic radiation therapy: Secondary | ICD-10-CM | POA: Diagnosis not present

## 2019-09-24 NOTE — Progress Notes (Signed)
[facility] CSW Progress Notes  Email from [place], [place] CSW assigned to patient's PCP in [place].  She has spoken w patient again and states that he now acknowledges that he could benefit from someone to help him at home.  He agreed to the referral for a [facility] home health aide - referral was initiated 09/23/19 by Myer Haff through the [place] system.  Edwyna Shell, LCSW Clinical Social Worker Phone:  [phone]

## 2019-09-24 NOTE — Progress Notes (Signed)
Patient and case discussed in Palliative Oncology Conference -Referral placed for [facility] to provide additional layer of support at home.

## 2019-09-24 NOTE — Telephone Encounter (Signed)
[facility] Palliative visit scheduled for 10-08-19 at 1:00.

## 2019-09-25 ENCOUNTER — Ambulatory Visit
Admission: RE | Admit: 2019-09-25 | Discharge: 2019-09-25 | Disposition: A | Discharge status: Home / Self Care | Payer: No Typology Code available for payment source | Source: Ambulatory Visit | Attending: Radiation Oncology | Admitting: Radiation Oncology

## 2019-09-25 ENCOUNTER — Encounter: Payer: Self-pay | Admitting: Radiation Oncology

## 2019-09-25 ENCOUNTER — Other Ambulatory Visit: Payer: Self-pay

## 2019-09-25 ENCOUNTER — Ambulatory Visit: Payer: No Typology Code available for payment source

## 2019-09-25 DIAGNOSIS — Z510 Encounter for antineoplastic radiation therapy: Secondary | ICD-10-CM | POA: Diagnosis not present

## 2019-09-26 ENCOUNTER — Ambulatory Visit: Payer: No Typology Code available for payment source

## 2019-09-26 ENCOUNTER — Other Ambulatory Visit: Payer: Self-pay | Admitting: *Deleted

## 2019-09-26 DIAGNOSIS — I82412 Acute embolism and thrombosis of left femoral vein: Secondary | ICD-10-CM

## 2019-10-01 ENCOUNTER — Encounter (HOSPITAL_COMMUNITY): Payer: No Typology Code available for payment source

## 2019-10-01 ENCOUNTER — Inpatient Hospital Stay: Payer: No Typology Code available for payment source

## 2019-10-01 ENCOUNTER — Inpatient Hospital Stay: Payer: No Typology Code available for payment source | Admitting: Internal Medicine

## 2019-10-01 ENCOUNTER — Encounter: Payer: No Typology Code available for payment source | Admitting: Vascular Surgery

## 2019-10-03 ENCOUNTER — Telehealth: Payer: Self-pay | Admitting: *Deleted

## 2019-10-03 ENCOUNTER — Encounter: Payer: Self-pay | Admitting: *Deleted

## 2019-10-03 DIAGNOSIS — J90 Pleural effusion, not elsewhere classified: Secondary | ICD-10-CM

## 2019-10-03 NOTE — Progress Notes (Signed)
I completed scheduling message to call and schedule him to be seen in 2 weeks with [facility]

## 2019-10-03 NOTE — Telephone Encounter (Signed)
With the patient to let him know that we are changing his steroid dosage from 4 mg BID to 2 mg BID.  He was advised to take 0.5 tablet in the morning and 0.5 tablet at night.  He should do this for one week and we will check in to see how it is going and at that point further taper him.  He verbalized understanding.  Will continue to follow as necessary.  Gloriajean Dell. Leonie Green, BSN

## 2019-10-03 NOTE — Telephone Encounter (Signed)
Patient was a no show this week to see Dr. Julien Nordmann.  I called and spoke to patient.  I asked him how he was doing and why he has missed several appts.  He states he forgot. I asked him about going into a facility to help with memory and facilitating care.  He is very resistant.  I listened as he explained.  I updated him that we will see him again but if he misses the appt then he will be referred back to [place].  He states he understands.

## 2019-10-04 ENCOUNTER — Telehealth: Payer: Self-pay | Admitting: Physician Assistant

## 2019-10-04 NOTE — Telephone Encounter (Signed)
Scheduled appointments per 7/22 provider message. Patient is aware of updated appointment date and time. Per patient request I have also sent a letter with updated appointment date and time to address on file.

## 2019-10-08 ENCOUNTER — Other Ambulatory Visit: Payer: No Typology Code available for payment source | Admitting: Internal Medicine

## 2019-10-08 ENCOUNTER — Other Ambulatory Visit: Payer: Self-pay

## 2019-10-11 ENCOUNTER — Telehealth: Payer: Self-pay

## 2019-10-11 NOTE — Telephone Encounter (Signed)
Called patient in regards to steroid dose taper per [name] PA Patient should have been taking 2mg  BID for 1 week (1/2 tablet in the am and 1/2 tab in the pm. If patient was taking as directed then taper down to 1/2 tablet daily for 1 week. Patient mailbox was full and could not leave a message. Tried to reach out to patient's friend [name] and was unable to leave a message on contact number in chart [phone]) TM

## 2019-10-14 ENCOUNTER — Telehealth: Payer: Self-pay | Admitting: *Deleted

## 2019-10-14 NOTE — Telephone Encounter (Signed)
I called to update Daniel Bryant about his appt to see [name] this week. I was unable to reach nor leave vm message.

## 2019-10-17 ENCOUNTER — Telehealth: Payer: Self-pay | Admitting: Physician Assistant

## 2019-10-17 ENCOUNTER — Inpatient Hospital Stay: Payer: No Typology Code available for payment source | Admitting: Physician Assistant

## 2019-10-17 ENCOUNTER — Inpatient Hospital Stay: Payer: No Typology Code available for payment source | Attending: Internal Medicine

## 2019-10-19 NOTE — Progress Notes (Signed)
  Radiation Oncology         [phone]) [phone] ________________________________  Name: Daniel Bryant MRN: [id number]  Date: 09/25/2019  DOB: [birth year]  End of Treatment Note  Diagnosis:   brain metastasis   Indication for treatment::  palliative       Radiation treatment dates:   09/11/19 - 09/25/19  Site/dose:   The patient was treated with a course of whole brain radiation therapy to a dose of 30 Gy in 10 fractions.  This was accomplished using a 2 field technique with additional reduced fields as necessary to improve dose homogeneity.  Narrative: The patient tolerated radiation treatment relatively well.   No unexpected difficulties in terms of acute toxicity.  The skin held up to treatment fairly well.  Plan: The patient has completed radiation treatment. The patient will return to radiation oncology clinic for routine followup in one month. I advised the patient to call or return sooner if they have any questions or concerns related to their recovery or treatment. ________________________________  Jodelle Gross, M.D., Ph.D.

## 2019-10-21 ENCOUNTER — Telehealth: Payer: Self-pay | Admitting: Medical Oncology

## 2019-10-21 ENCOUNTER — Telehealth: Payer: Self-pay | Admitting: Radiation Oncology

## 2019-10-21 NOTE — Telephone Encounter (Signed)
I was going to call the patient to follow up on his post radiotherapy course. Unfortunately there is a nursing note from this morning stating the patient was found deceased at home yesterday. I will change his status in epic.    Carola Rhine, PAC

## 2019-10-21 NOTE — Telephone Encounter (Signed)
[place] police found pt deceased 10/30/2019.

## 2019-10-31 ENCOUNTER — Telehealth: Payer: Self-pay | Admitting: Medical Oncology

## 2019-10-31 NOTE — Telephone Encounter (Signed)
Death Certificate-Asking if Daniel Bryant will sign death certificate. I told [facility] that he will not sign death certificate because he does not know what was his cause of death.   Last visit was 2019-07-02. I gave the following  information fto [facility]- [place] number , referring provider and contact person .

## 2019-11-05 ENCOUNTER — Encounter (HOSPITAL_COMMUNITY): Payer: No Typology Code available for payment source

## 2019-11-05 ENCOUNTER — Encounter: Payer: No Typology Code available for payment source | Admitting: Vascular Surgery

## 2019-11-13 DEATH — deceased

## 2019-11-19 NOTE — Telephone Encounter (Signed)
Error. Rayshaun Needle M Lacresha Fusilier, RN  

## 2019-11-19 NOTE — Telephone Encounter (Signed)
Error. Nam Vossler M Aleka Twitty, RN  

## 2019-11-19 NOTE — Telephone Encounter (Signed)
Error. Glenn Gullickson M Manan Olmo, RN  

## 2022-02-24 NOTE — Telephone Encounter (Signed)
Error
